# Patient Record
Sex: Female | Born: 1935
Health system: Southern US, Community
[De-identification: ages and names within clinical notes are randomized; demographics above are authoritative.]

## PROBLEM LIST (undated history)

## (undated) DIAGNOSIS — Z8619 Personal history of other infectious and parasitic diseases: Secondary | ICD-10-CM

## (undated) DIAGNOSIS — M1990 Unspecified osteoarthritis, unspecified site: Secondary | ICD-10-CM

## (undated) DIAGNOSIS — F32A Depression, unspecified: Secondary | ICD-10-CM

## (undated) DIAGNOSIS — K222 Esophageal obstruction: Secondary | ICD-10-CM

## (undated) DIAGNOSIS — M810 Age-related osteoporosis without current pathological fracture: Secondary | ICD-10-CM

## (undated) DIAGNOSIS — K5792 Diverticulitis of intestine, part unspecified, without perforation or abscess without bleeding: Secondary | ICD-10-CM

## (undated) DIAGNOSIS — I872 Venous insufficiency (chronic) (peripheral): Secondary | ICD-10-CM

## (undated) DIAGNOSIS — E785 Hyperlipidemia, unspecified: Secondary | ICD-10-CM

## (undated) DIAGNOSIS — F329 Major depressive disorder, single episode, unspecified: Secondary | ICD-10-CM

## (undated) DIAGNOSIS — I639 Cerebral infarction, unspecified: Secondary | ICD-10-CM

## (undated) DIAGNOSIS — I10 Essential (primary) hypertension: Secondary | ICD-10-CM

## (undated) DIAGNOSIS — K219 Gastro-esophageal reflux disease without esophagitis: Secondary | ICD-10-CM

## (undated) DIAGNOSIS — K635 Polyp of colon: Secondary | ICD-10-CM

## (undated) DIAGNOSIS — R32 Unspecified urinary incontinence: Secondary | ICD-10-CM

## (undated) HISTORY — DX: Esophageal obstruction: K22.2

## (undated) HISTORY — DX: Polyp of colon: K63.5

## (undated) HISTORY — DX: Personal history of other infectious and parasitic diseases: Z86.19

## (undated) HISTORY — DX: Cerebral infarction, unspecified: I63.9

## (undated) HISTORY — PX: UPPER GASTROINTESTINAL ENDOSCOPY: SHX188

## (undated) HISTORY — DX: Venous insufficiency (chronic) (peripheral): I87.2

## (undated) HISTORY — DX: Unspecified urinary incontinence: R32

## (undated) HISTORY — PX: POLYPECTOMY: SHX149

## (undated) HISTORY — DX: Diverticulitis of intestine, part unspecified, without perforation or abscess without bleeding: K57.92

## (undated) HISTORY — DX: Gastro-esophageal reflux disease without esophagitis: K21.9

## (undated) HISTORY — DX: Major depressive disorder, single episode, unspecified: F32.9

## (undated) HISTORY — PX: TUBAL LIGATION: SHX77

## (undated) HISTORY — DX: Depression, unspecified: F32.A

## (undated) HISTORY — DX: Hyperlipidemia, unspecified: E78.5

## (undated) HISTORY — DX: Age-related osteoporosis without current pathological fracture: M81.0

## (undated) HISTORY — PX: COLONOSCOPY: SHX174

## (undated) HISTORY — DX: Essential (primary) hypertension: I10

## (undated) HISTORY — PX: APPENDECTOMY: SHX54

## (undated) HISTORY — DX: Unspecified osteoarthritis, unspecified site: M19.90

---

## 1998-08-06 ENCOUNTER — Other Ambulatory Visit: Admission: RE | Admit: 1998-08-06 | Discharge: 1998-08-06 | Payer: Self-pay | Admitting: Internal Medicine

## 1999-05-08 ENCOUNTER — Ambulatory Visit: Admission: RE | Admit: 1999-05-08 | Discharge: 1999-05-08 | Payer: Self-pay | Admitting: Dentistry

## 1999-12-04 ENCOUNTER — Encounter: Admission: RE | Admit: 1999-12-04 | Discharge: 1999-12-04 | Payer: Self-pay | Admitting: Cardiology

## 1999-12-04 ENCOUNTER — Encounter: Payer: Self-pay | Admitting: Cardiology

## 2000-01-15 ENCOUNTER — Encounter: Payer: Self-pay | Admitting: Rheumatology

## 2000-01-15 ENCOUNTER — Encounter: Admission: RE | Admit: 2000-01-15 | Discharge: 2000-01-15 | Payer: Self-pay | Admitting: Rheumatology

## 2000-02-22 ENCOUNTER — Other Ambulatory Visit: Admission: RE | Admit: 2000-02-22 | Discharge: 2000-02-22 | Payer: Self-pay | Admitting: Obstetrics and Gynecology

## 2000-02-22 ENCOUNTER — Encounter (INDEPENDENT_AMBULATORY_CARE_PROVIDER_SITE_OTHER): Payer: Self-pay | Admitting: Specialist

## 2000-12-12 ENCOUNTER — Encounter: Admission: RE | Admit: 2000-12-12 | Discharge: 2000-12-12 | Payer: Self-pay | Admitting: Cardiology

## 2000-12-12 ENCOUNTER — Encounter: Payer: Self-pay | Admitting: Cardiology

## 2001-02-27 ENCOUNTER — Other Ambulatory Visit: Admission: RE | Admit: 2001-02-27 | Discharge: 2001-02-27 | Payer: Self-pay | Admitting: Obstetrics and Gynecology

## 2001-12-13 ENCOUNTER — Encounter: Admission: RE | Admit: 2001-12-13 | Discharge: 2001-12-13 | Payer: Self-pay | Admitting: Obstetrics and Gynecology

## 2001-12-13 ENCOUNTER — Encounter: Payer: Self-pay | Admitting: Obstetrics and Gynecology

## 2002-02-14 ENCOUNTER — Encounter (INDEPENDENT_AMBULATORY_CARE_PROVIDER_SITE_OTHER): Payer: Self-pay | Admitting: Specialist

## 2002-02-14 ENCOUNTER — Ambulatory Visit (HOSPITAL_COMMUNITY): Admission: RE | Admit: 2002-02-14 | Discharge: 2002-02-14 | Payer: Self-pay | Admitting: Gastroenterology

## 2002-04-21 ENCOUNTER — Ambulatory Visit (HOSPITAL_COMMUNITY): Admission: EM | Admit: 2002-04-21 | Discharge: 2002-04-22 | Payer: Self-pay | Admitting: Emergency Medicine

## 2002-05-11 ENCOUNTER — Other Ambulatory Visit: Admission: RE | Admit: 2002-05-11 | Discharge: 2002-05-11 | Payer: Self-pay | Admitting: Obstetrics and Gynecology

## 2002-06-29 ENCOUNTER — Encounter: Payer: Self-pay | Admitting: Internal Medicine

## 2002-06-29 ENCOUNTER — Ambulatory Visit (HOSPITAL_COMMUNITY): Admission: RE | Admit: 2002-06-29 | Discharge: 2002-06-29 | Payer: Self-pay | Admitting: Internal Medicine

## 2002-10-09 ENCOUNTER — Encounter: Payer: Self-pay | Admitting: Internal Medicine

## 2002-10-09 ENCOUNTER — Ambulatory Visit (HOSPITAL_COMMUNITY): Admission: RE | Admit: 2002-10-09 | Discharge: 2002-10-09 | Payer: Self-pay | Admitting: Internal Medicine

## 2002-12-18 ENCOUNTER — Encounter: Admission: RE | Admit: 2002-12-18 | Discharge: 2002-12-18 | Payer: Self-pay | Admitting: Obstetrics and Gynecology

## 2002-12-18 ENCOUNTER — Encounter: Payer: Self-pay | Admitting: Obstetrics and Gynecology

## 2003-02-12 ENCOUNTER — Encounter: Admission: RE | Admit: 2003-02-12 | Discharge: 2003-02-12 | Payer: Self-pay | Admitting: Rheumatology

## 2003-06-04 ENCOUNTER — Other Ambulatory Visit: Admission: RE | Admit: 2003-06-04 | Discharge: 2003-06-04 | Payer: Self-pay | Admitting: Obstetrics and Gynecology

## 2003-12-20 ENCOUNTER — Encounter: Admission: RE | Admit: 2003-12-20 | Discharge: 2003-12-20 | Payer: Self-pay | Admitting: Cardiology

## 2003-12-25 ENCOUNTER — Encounter: Admission: RE | Admit: 2003-12-25 | Discharge: 2003-12-25 | Payer: Self-pay | Admitting: Cardiology

## 2005-04-20 ENCOUNTER — Encounter: Admission: RE | Admit: 2005-04-20 | Discharge: 2005-04-20 | Payer: Self-pay | Admitting: Obstetrics and Gynecology

## 2005-11-18 ENCOUNTER — Ambulatory Visit: Payer: Self-pay | Admitting: Internal Medicine

## 2005-12-09 ENCOUNTER — Ambulatory Visit: Payer: Self-pay | Admitting: Internal Medicine

## 2005-12-09 ENCOUNTER — Encounter: Payer: Self-pay | Admitting: Internal Medicine

## 2006-06-22 ENCOUNTER — Encounter: Admission: RE | Admit: 2006-06-22 | Discharge: 2006-06-22 | Payer: Self-pay | Admitting: Cardiology

## 2006-08-01 ENCOUNTER — Encounter: Admission: RE | Admit: 2006-08-01 | Discharge: 2006-08-01 | Payer: Self-pay | Admitting: Cardiology

## 2006-10-04 ENCOUNTER — Encounter: Admission: RE | Admit: 2006-10-04 | Discharge: 2006-11-02 | Payer: Self-pay | Admitting: *Deleted

## 2008-01-16 ENCOUNTER — Encounter: Admission: RE | Admit: 2008-01-16 | Discharge: 2008-01-16 | Payer: Self-pay | Admitting: Cardiology

## 2008-08-13 ENCOUNTER — Encounter: Admission: RE | Admit: 2008-08-13 | Discharge: 2008-08-13 | Payer: Self-pay | Admitting: Rheumatology

## 2009-04-18 ENCOUNTER — Encounter: Admission: RE | Admit: 2009-04-18 | Discharge: 2009-04-18 | Payer: Self-pay | Admitting: Obstetrics and Gynecology

## 2009-10-21 ENCOUNTER — Encounter
Admission: RE | Admit: 2009-10-21 | Discharge: 2010-01-19 | Payer: Self-pay | Source: Home / Self Care | Admitting: Rheumatology

## 2009-12-22 ENCOUNTER — Ambulatory Visit: Payer: Self-pay | Admitting: Cardiology

## 2010-01-27 ENCOUNTER — Encounter
Admission: RE | Admit: 2010-01-27 | Discharge: 2010-02-12 | Payer: Self-pay | Source: Home / Self Care | Admitting: Rheumatology

## 2010-02-13 ENCOUNTER — Ambulatory Visit: Payer: Self-pay | Admitting: Cardiology

## 2010-04-15 ENCOUNTER — Ambulatory Visit: Payer: Self-pay | Admitting: Cardiology

## 2010-04-20 ENCOUNTER — Encounter: Payer: Self-pay | Admitting: Cardiology

## 2010-05-08 ENCOUNTER — Other Ambulatory Visit: Payer: Self-pay | Admitting: Obstetrics and Gynecology

## 2010-05-08 DIAGNOSIS — Z1231 Encounter for screening mammogram for malignant neoplasm of breast: Secondary | ICD-10-CM

## 2010-06-02 ENCOUNTER — Ambulatory Visit: Payer: Self-pay

## 2010-06-05 ENCOUNTER — Ambulatory Visit: Payer: Self-pay

## 2010-06-19 ENCOUNTER — Ambulatory Visit
Admission: RE | Admit: 2010-06-19 | Discharge: 2010-06-19 | Disposition: A | Discharge status: Home / Self Care | Payer: Medicare Other | Source: Ambulatory Visit | Attending: Obstetrics and Gynecology | Admitting: Obstetrics and Gynecology

## 2010-06-19 DIAGNOSIS — Z1231 Encounter for screening mammogram for malignant neoplasm of breast: Secondary | ICD-10-CM

## 2010-07-28 ENCOUNTER — Other Ambulatory Visit: Payer: Self-pay | Admitting: *Deleted

## 2010-07-28 DIAGNOSIS — F419 Anxiety disorder, unspecified: Secondary | ICD-10-CM

## 2010-07-28 MED ORDER — ALPRAZOLAM 1 MG PO TABS: 1.0000 mg | ORAL_TABLET | Freq: Two times a day (BID) | ORAL | Status: DC | PRN

## 2010-07-28 NOTE — Telephone Encounter (Signed)
Refilled meds per fax request.  

## 2010-08-14 NOTE — Assessment & Plan Note (Signed)
[facility]                           GASTROENTEROLOGY OFFICE NOTE   NAME:Sarah Sarah [name], Sarah Sarah [name]                      MRN:          [id number]  DATE:11/18/2005                            DOB:          07/04/1926    REFERRING PHYSICIAN:  Aundra Dubin, MD   REASON FOR CONSULTATION:  Iron deficiency anemia.   HISTORY OF PRESENT ILLNESS:  This is Sarah [name] pleasant 77 year old white female  with Sarah [name] history of gastroesophageal reflux disease, complicated by peptic  stricture requiring esophageal dilation, atrophic gastritis, adenomatous  colon polyps, diverticulosis, and osteoarthritis.  She is referred through  the courtesy of Dr. Kellie Simmering, her rheumatologist, regarding progressive  anemia with iron deficiency.  Since February the patient's hemoglobin has  drifted from 10.7 to 10.0.  In addition, her MCV has dropped from 90 to 86.  Iron studies obtained 10/28/05 revealed iron deficiency with an iron  saturation of 6% and Sarah [name] serum ferritin of 5.  She is not referred.  Patient  has been on Diclofenac for her arthritis.  Concurrently she has been on  omeprazole.  GI review of systems is quite unremarkable.  On Omeprazole no  indigestion or heartburn.  No dysphagia.  No abdominal pain.  She does have  chronic constipation for which she takes GlycoLax daily with good results.  She has had some weight gain on prednisone.  No melena or hematochezia.  The  last colonoscopy was performed in 2000.  She had Sarah [name] adenomatous colon polyp  removed.  She was to have followed up in three years but elected not to  despite such Sarah [name] recommendation.  In part due to the death of her husband  three and Sarah [name] half years ago.  Patient was placed on conventional iron orally  which she did not tolerate.  As well she apparently did not tolerate slow  release preparation sighting nausea.   PAST MEDICAL HISTORY:  As above.   ALLERGIES:  1. CELEBREX.  2. LATEX GLOVES.  3. INTOLERANCE TO  CIPROFLOXACIN.   CURRENT MEDICATIONS:  1. Calcium 1200 mg daily.  2. Multivitamin.  3. Aspirin 81 mg daily.  4. __________  2 mg at night.  5. Pamelor 25 mg at night.  6. Halcion 25 mg prn.  7. Actonel 30 mg weekly.  8. Xanax 1 mg prn.  9. GlycoLax 17 grams daily in 10 ounces of water.  10.Prilosec 20 mg daily.  11.Lasix 20 mg prn.  12.Desonide prn.  13.Diclofenac 75 mg bid.  14.Glucosamine daily.   SOCIAL HISTORY:  Widowed, lives alone in [place].   FAMILY HISTORY:  Non-contributory.   PHYSICAL EXAMINATION:  Pleasant elderly female in no acute distress.  Her  blood pressure 100/60, heart rate 78, weight 152.6 pounds.  HEENT:  Sclera anicteric, conjunctiva pink, oral mucosa intact, no  adenopathy.  LUNGS:  Clear.  HEART: Regular.  ABDOMEN:  Slightly obese, soft without tenderness, mass or hernia.  Good  bowel sounds heard.  RECTAL:  Deferred.  Prior Hemoccult cards were reportedly negative.  EXTREMITIES:  Without edema.   IMPRESSION:  This is Sarah [name] 77 year old female with  history of reflux disease,  peptic stricture, chronic stable constipation, and adenomatous colon polyps  who presents with iron deficiency anemia.  Fairly unremarkable GI review of  systems.  Possible causes include chronic blood loss from combination of  aspirin and Diclofenac.  However, with Sarah [name] history of adenomatous colon polyps  recurrent neoplasia needs to be excluded.   RECOMMENDATIONS:  Colonoscopy and upper endoscopy.  Ashby Dawes of the procedure  as well as the risks, benefits and alternatives were reviewed in detail.  She understood and agreed to proceed.  Further recommendations to follow.                                   Wilhemina Bonito. Eda Keys., MD   JNP/MedQ  DD:  11/18/2005  DT:  11/18/2005  Job #:  [id number]   cc:   Aundra Dubin, MD  Cassell Clement, MD

## 2010-08-14 NOTE — Op Note (Signed)
   NAMESHYA, KOVATCH NO.:  [id number]   MEDICAL RECORD NO.:  [id number]                   PATIENT TYPE:  EMS   LOCATION:  ED                                   FACILITY:  [facility]   PHYSICIAN:  Georgiana Spinner, M.D.                 DATE OF BIRTH:  July 09, 1926   DATE OF PROCEDURE:  DATE OF DISCHARGE:  04/22/2002                                 OPERATIVE REPORT   PROCEDURE:  Upper endoscopy with foreign body removal.   INDICATIONS:  Foreign body in esophagus.   ANESTHESIA:  Demerol 100, Versed 10 mg, Phenergan 12.5 mg.   DESCRIPTION OF PROCEDURE:  With patient mildly sedated in the left lateral  decubitus position, the Olympus videoscopic endoscope was inserted in the  mouth and passed under direct vision into the esophagus, and distal  esophagus approached, and a whitish foreign body, presumably meat, was seen.  It was easily pushed into the stomach.  In the stomach, fundus, body, antrum  was visualized as was duodenal bulb.  The endoscope was placed in  retroflexion as it was pulled back into the stomach to view the stomach from  below.  The endoscope was then straightened and withdrawn, taking  circumferential views of the remaining gastric and esophageal mucosa,  stopping to photograph the distal esophagus where the GE junction was, and a  stricture just above this area which was photographed.  There was a small  ulcer, presumably due to the trauma, photographed; the endoscope was  withdrawn.  The patient's vital signs and pulse oximeter remained stable.  The patient tolerated the procedure well without apparent complications.   FINDINGS:  Stricture in the distal esophagus with food foreign body removed  by pushing it into the stomach.  Otherwise, an unremarkable exam.   PLAN:  The patient has follow-up already planned for esophageal dilation.  We will place patient on liquid diet, avoidance of meats and breads, and  follow up with her regular  gastroenterologist.                                               Georgiana Spinner, M.D.    GMO/MEDQ  D:  04/22/2002  T:  04/22/2002  Job:  [id number]

## 2010-08-30 ENCOUNTER — Other Ambulatory Visit: Payer: Self-pay | Admitting: Cardiology

## 2010-08-30 DIAGNOSIS — G4700 Insomnia, unspecified: Secondary | ICD-10-CM

## 2010-08-31 NOTE — Telephone Encounter (Signed)
escribe request Faxed signed rx back

## 2010-10-04 ENCOUNTER — Other Ambulatory Visit: Payer: Self-pay | Admitting: Cardiology

## 2010-10-05 ENCOUNTER — Telehealth: Payer: Self-pay | Admitting: Cardiology

## 2010-10-05 NOTE — Telephone Encounter (Signed)
Patient called requesting labwork be done.  States has not had any done in almost 1 yr.  Spoke to nurse and she advised labs for patient to have.  Advised and made appointment for patient.

## 2010-10-05 NOTE — Telephone Encounter (Signed)
escribe request  

## 2010-10-06 ENCOUNTER — Other Ambulatory Visit: Payer: Self-pay | Admitting: *Deleted

## 2010-10-06 DIAGNOSIS — E785 Hyperlipidemia, unspecified: Secondary | ICD-10-CM

## 2010-10-16 ENCOUNTER — Other Ambulatory Visit: Payer: Medicare Other | Admitting: *Deleted

## 2010-10-20 ENCOUNTER — Other Ambulatory Visit: Payer: Medicare Other | Admitting: *Deleted

## 2010-10-21 ENCOUNTER — Other Ambulatory Visit: Payer: Medicare Other | Admitting: *Deleted

## 2010-10-21 ENCOUNTER — Other Ambulatory Visit: Payer: Self-pay | Admitting: Cardiology

## 2010-10-21 ENCOUNTER — Other Ambulatory Visit (INDEPENDENT_AMBULATORY_CARE_PROVIDER_SITE_OTHER): Payer: Medicare Other | Admitting: *Deleted

## 2010-10-21 DIAGNOSIS — E785 Hyperlipidemia, unspecified: Secondary | ICD-10-CM

## 2010-10-21 LAB — LDL CHOLESTEROL, DIRECT: Direct LDL: 101.1 mg/dL

## 2010-10-21 LAB — BASIC METABOLIC PANEL
BUN: 16 mg/dL (ref 6–23)
Calcium: 8.8 mg/dL (ref 8.4–10.5)
GFR: 78.6 mL/min (ref >60.00)

## 2010-10-21 LAB — CBC WITH DIFFERENTIAL/PLATELET
Basophils Absolute: 0 10*3/uL (ref 0.0–0.1)
Hemoglobin: 12.2 g/dL (ref 12.0–15.0)
Lymphs Abs: 1.9 10*3/uL (ref 0.7–4.0)
MCV: 95.9 fl (ref 78.0–100.0)
RBC: 3.79 Mil/uL — ABNORMAL LOW (ref 3.87–5.11)
RDW: 13.7 % (ref 11.5–14.6)
WBC: 5.2 10*3/uL (ref 4.5–10.5)

## 2010-10-21 LAB — HEPATIC FUNCTION PANEL
AST: 12 U/L (ref 0–37)
Alkaline Phosphatase: 50 U/L (ref 39–117)
Bilirubin, Direct: 0 mg/dL (ref 0.0–0.3)
Total Bilirubin: 0.5 mg/dL (ref 0.3–1.2)

## 2010-10-21 LAB — LIPID PANEL
Cholesterol: 212 mg/dL — ABNORMAL HIGH (ref 0–200)
Total CHOL/HDL Ratio: 2

## 2010-10-28 ENCOUNTER — Telehealth: Payer: Self-pay | Admitting: *Deleted

## 2010-10-28 ENCOUNTER — Other Ambulatory Visit: Payer: Self-pay | Admitting: *Deleted

## 2010-10-28 DIAGNOSIS — G4700 Insomnia, unspecified: Secondary | ICD-10-CM

## 2010-10-28 MED ORDER — ALPRAZOLAM 1 MG PO TABS: 1.0000 mg | ORAL_TABLET | Freq: Every evening | ORAL | Status: DC | PRN

## 2010-10-28 NOTE — Telephone Encounter (Signed)
Advised of labs 

## 2010-10-28 NOTE — Telephone Encounter (Signed)
Refilled alprazolam 

## 2010-12-02 ENCOUNTER — Other Ambulatory Visit: Payer: Self-pay | Admitting: Cardiology

## 2010-12-02 DIAGNOSIS — I10 Essential (primary) hypertension: Secondary | ICD-10-CM

## 2010-12-02 DIAGNOSIS — E785 Hyperlipidemia, unspecified: Secondary | ICD-10-CM

## 2010-12-04 ENCOUNTER — Other Ambulatory Visit: Payer: Self-pay | Admitting: Cardiology

## 2010-12-04 DIAGNOSIS — F419 Anxiety disorder, unspecified: Secondary | ICD-10-CM

## 2010-12-09 ENCOUNTER — Telehealth: Payer: Self-pay | Admitting: Cardiology

## 2010-12-09 ENCOUNTER — Telehealth: Payer: Self-pay | Admitting: *Deleted

## 2010-12-09 NOTE — Telephone Encounter (Signed)
Pt calling re wanting a hemacult

## 2010-12-09 NOTE — Telephone Encounter (Signed)
Patient will call her GI doctor for follow-up hemoccult checks and we mailed her a copy of [facility]

## 2010-12-11 ENCOUNTER — Other Ambulatory Visit: Payer: Self-pay | Admitting: Cardiology

## 2010-12-11 NOTE — Telephone Encounter (Signed)
Refilled lasix and vesicare to [facility]

## 2010-12-16 ENCOUNTER — Telehealth: Payer: Self-pay | Admitting: Cardiology

## 2010-12-16 NOTE — Telephone Encounter (Signed)
Recommended Dr Willey Blade

## 2010-12-16 NOTE — Telephone Encounter (Signed)
Wants you to recommend a primary care provider

## 2010-12-21 ENCOUNTER — Telehealth: Payer: Self-pay | Admitting: Cardiology

## 2010-12-21 NOTE — Telephone Encounter (Signed)
Thought yeast infection.

## 2010-12-21 NOTE — Telephone Encounter (Signed)
Has vaginal itching and has seen her dermatologist for this.  They thought yeast time, so she will call her GYN

## 2010-12-21 NOTE — Telephone Encounter (Signed)
Pt called wants you to call her about rx's

## 2010-12-23 ENCOUNTER — Telehealth: Payer: Self-pay | Admitting: Cardiology

## 2010-12-23 NOTE — Telephone Encounter (Signed)
Pt calling asking that someone call her back. Please call pt back.   Pt got a summons yesterday to serve on jury and pt wanted a MD letter stating that pt doesn't need to serve on jury duty.

## 2010-12-23 NOTE — Telephone Encounter (Signed)
Spoke with patient and she will just fill out the back of form and mail in

## 2010-12-29 ENCOUNTER — Telehealth: Payer: Self-pay | Admitting: Cardiology

## 2010-12-29 NOTE — Telephone Encounter (Signed)
Left message

## 2010-12-29 NOTE — Telephone Encounter (Signed)
Pt was referred to dr Felicity Coyer and she couldn't get in until march, would he recommend someone else?

## 2011-01-08 NOTE — Telephone Encounter (Signed)
Never heard back, left message just keep March appointment with Dr Felicity Coyer.

## 2011-01-11 ENCOUNTER — Telehealth: Payer: Self-pay | Admitting: Cardiology

## 2011-01-11 NOTE — Telephone Encounter (Signed)
Spoke with patient regarding new primary care doctor

## 2011-01-11 NOTE — Telephone Encounter (Signed)
Pt calling re her new dr

## 2011-02-01 ENCOUNTER — Telehealth: Payer: Self-pay | Admitting: Cardiology

## 2011-02-01 ENCOUNTER — Encounter: Payer: Self-pay | Admitting: *Deleted

## 2011-02-01 DIAGNOSIS — R079 Chest pain, unspecified: Secondary | ICD-10-CM | POA: Insufficient documentation

## 2011-02-01 DIAGNOSIS — M1990 Unspecified osteoarthritis, unspecified site: Secondary | ICD-10-CM | POA: Insufficient documentation

## 2011-02-02 ENCOUNTER — Encounter: Payer: Self-pay | Admitting: Cardiology

## 2011-02-02 ENCOUNTER — Other Ambulatory Visit: Payer: Medicare Other | Admitting: *Deleted

## 2011-02-02 ENCOUNTER — Ambulatory Visit (INDEPENDENT_AMBULATORY_CARE_PROVIDER_SITE_OTHER): Payer: Medicare Other | Admitting: Cardiology

## 2011-02-02 ENCOUNTER — Other Ambulatory Visit (INDEPENDENT_AMBULATORY_CARE_PROVIDER_SITE_OTHER): Payer: Medicare Other | Admitting: *Deleted

## 2011-02-02 DIAGNOSIS — E785 Hyperlipidemia, unspecified: Secondary | ICD-10-CM

## 2011-02-02 DIAGNOSIS — I10 Essential (primary) hypertension: Secondary | ICD-10-CM

## 2011-02-02 DIAGNOSIS — M1990 Unspecified osteoarthritis, unspecified site: Secondary | ICD-10-CM

## 2011-02-02 DIAGNOSIS — R079 Chest pain, unspecified: Secondary | ICD-10-CM

## 2011-02-02 DIAGNOSIS — R32 Unspecified urinary incontinence: Secondary | ICD-10-CM

## 2011-02-02 LAB — BASIC METABOLIC PANEL
Calcium: 9.1 mg/dL (ref 8.4–10.5)
GFR: 87.95 mL/min (ref >60.00)
Glucose, Bld: 97 mg/dL (ref 70–99)
Sodium: 134 mEq/L — ABNORMAL LOW (ref 135–145)

## 2011-02-02 LAB — LIPID PANEL
HDL: 82.6 mg/dL (ref >39.00)
Triglycerides: 131 mg/dL (ref 0.0–149.0)

## 2011-02-02 LAB — HEPATIC FUNCTION PANEL
ALT: 16 U/L (ref 0–35)
AST: 14 U/L (ref 0–37)
Albumin: 4 g/dL (ref 3.5–5.2)
Total Bilirubin: 0.5 mg/dL (ref 0.3–1.2)

## 2011-02-02 MED ORDER — NITROGLYCERIN 0.4 MG SL SUBL: 0.4000 mg | SUBLINGUAL_TABLET | SUBLINGUAL | Status: DC | PRN

## 2011-02-02 NOTE — Assessment & Plan Note (Signed)
Patient is still having problems with bladder incontinence.  She sees Dr. Bjorn Pippin.  Her symptoms have not worsened since her previous visit.

## 2011-02-02 NOTE — Assessment & Plan Note (Signed)
The patient has been expressing some intermittent left-sided chest discomfort without radiation.  She has not been having any symptoms of congestive heart failure.  She's had no dizziness or syncope.  An electrocardiogram today which shows a sinus rhythm and is within normal limits.

## 2011-02-02 NOTE — Patient Instructions (Signed)
Your physician recommends that you continue on your current medications as directed. Please refer to the Current Medication list given to you today.  Your physician recommends that you schedule a follow-up appointment in: 4 months  

## 2011-02-02 NOTE — Progress Notes (Signed)
Sarah [name] Date of Birth:  [birth year] [facility] / [facility] [address]   [address] [place], [place]  [address] [phone]           Fax   [phone]  History of Present Illness: This pleasant 77 year old widowed Caucasian female is seen for a scheduled four-month followup office visit.  She has a history of atypical chest pain.  She does not have a history of ischemic heart disease.  She had a normal stress Cardiolite in April of 2010.  Recently she's been experiencing more palpitations and has had some sharp shooting nonradiating chest pain.  She also states that she's been under more stress.  Current Outpatient Prescriptions  Medication Sig Dispense Refill  . Acetaminophen (TYLENOL ARTHRITIS PAIN PO) Take by mouth 2 (two) times daily.        Marland Kitchen ALPRAZolam (XANAX) 1 MG tablet Take 1 mg by mouth 2 (two) times daily.        Marland Kitchen aspirin 81 MG tablet Take 81 mg by mouth daily.        . Calcium Carbonate (CALCIUM 500 PO) Take by mouth daily.        . Cholecalciferol (VITAMIN D PO) Take by mouth. Taking 2000 daily       . Cyanocobalamin (VITAMIN B 12 PO) Take by mouth. Taking 1000 daily       . LASIX 20 MG tablet TAKE 1 TABLET ONCE DAILY AS NEEDED FOR FLUID.  30 each  11  . Multiple Vitamin (MULTIVITAMINS PO) Take by mouth daily.        . Naproxen (NAPROSYN PO) Take by mouth. As needed       . Omega-3 Fatty Acids (FISH OIL PO) Take by mouth 2 (two) times daily.        . Omeprazole (PRILOSEC PO) Take by mouth daily.        Marland Kitchen PAMELOR 25 MG capsule TAKE (1) CAPSULE DAILY.  30 each  5  . perphenazine (TRILAFON) 2 MG tablet TAKE ONE TABLET AT BEDTIME.  30 tablet  2  . polyethylene glycol (MIRALAX / GLYCOLAX) packet Take 17 g by mouth daily. As needed       . risedronate (ACTONEL) 35 MG tablet Take 35 mg by mouth every 7 (seven) days. with water on empty stomach, nothing by mouth or lie down for next 30 minutes.       . triazolam (HALCION) 0.25 MG tablet Take 1 tablet at  bedtime as needed or as directed      . VESICARE 10 MG tablet TAKE 1 TABLET ONCE DAILY.  30 each  11  . nitroGLYCERIN (NITROSTAT) 0.4 MG SL tablet Place 1 tablet (0.4 mg total) under the tongue every 5 (five) minutes as needed for chest pain.  25 tablet  PRN    Allergies  Allergen Reactions  . Celebrex (Celecoxib)     rash  . Latex   . Lipitor (Atorvastatin Calcium)     Leg weakness,memory loss  . Lovastatin     Leg weakness    Patient Active Problem List  Diagnoses  . Osteoarthritis  . Bladder incontinence  . Chest pain    History  Smoking status  . Never Smoker   Smokeless tobacco  . Not on file    History  Alcohol Use     No family history on file.  Review of Systems: Constitutional: no fever chills diaphoresis or fatigue or change in weight.  Head and neck: no  hearing loss, no epistaxis, no photophobia or visual disturbance. Respiratory: No cough, shortness of breath or wheezing. Cardiovascular: No chest pain peripheral edema, palpitations. Gastrointestinal: No abdominal distention, no abdominal pain, no change in bowel habits hematochezia or melena. Genitourinary: No dysuria, no frequency, no urgency, no nocturia. Musculoskeletal:No arthralgias, no back pain, no gait disturbance or myalgias. Neurological: No dizziness, no headaches, no numbness, no seizures, no syncope, no weakness, no tremors. Hematologic: No lymphadenopathy, no easy bruising. Psychiatric: No confusion, no hallucinations, no sleep disturbance.    Physical Exam: Filed Vitals:   02/02/11 1446  BP: 132/70  Pulse: 90   Gen. appearance reveals a well-developed well-nourished elderly woman in no distress.The head and neck exam reveals pupils equal and reactive.  Extraocular movements are full.  There is no scleral icterus.  The mouth and pharynx are normal.  The neck is supple.  The carotids reveal no bruits.  The jugular venous pressure is normal.  The  thyroid is not enlarged.  There is no  lymphadenopathy.  The chest is clear to percussion and auscultation.  There are no rales or rhonchi.  Expansion of the chest is symmetrical.  The precordium is quiet.  The first heart sound is normal.  The second heart sound is physiologically split.  There is no murmur gallop rub or click.  There is no abnormal lift or heave.  The abdomen is soft and nontender.  The bowel sounds are normal.  The liver and spleen are not enlarged.  There are no abdominal masses.  There are no abdominal bruits.  Extremities reveal good pedal pulses.  There is no phlebitis or edema.  There is no cyanosis or clubbing.  Strength is normal and symmetrical in all extremities.  There is no lateralizing weakness.  There are no sensory deficits.  The skin is warm and dry.  There is no rash.  EKG shows normal sinus rhythm and is within normal limits.  Assessment / Plan:  Continue present medication.  Weight gave her a new prescription for sublingual nitroglycerin to have on hand.  Return in 4 months for office visit and fasting lab work.

## 2011-02-02 NOTE — Assessment & Plan Note (Signed)
Patient has a history of osteoarthritis.  She previously had been seeing a rheumatologist but is not seen one at present.  She has an appointment in March 2013 to see her new primary care provider Dr. Madelyn Brunner

## 2011-02-03 ENCOUNTER — Telehealth: Payer: Self-pay | Admitting: *Deleted

## 2011-02-03 NOTE — Telephone Encounter (Signed)
Message copied by Burnell Blanks on Wed Feb 03, 2011  5:19 PM ------      Message from: Cassell Clement      Created: Tue Feb 02, 2011  8:55 PM       Please report.  The labs are stable.  Continue same meds.  Continue careful diet.

## 2011-02-03 NOTE — Telephone Encounter (Signed)
Advised of labs and mailed copy 

## 2011-02-05 ENCOUNTER — Other Ambulatory Visit: Payer: Self-pay | Admitting: Cardiology

## 2011-02-05 NOTE — Telephone Encounter (Signed)
Pt needs refill of bone density med, boniva, uses [facility], pls call if we can't fill it

## 2011-03-02 NOTE — Telephone Encounter (Signed)
Double up on Lasix when she goes to the beach

## 2011-03-02 NOTE — Telephone Encounter (Signed)
Swelling gets severe when she goes to stay at the beach with her sisters.  Home now and is getting better.  Swelling does not go down at bedtime and is quite painful. Takes lasix 20 mg daily when she is there.  She is going back end of February and wants to know what else to do.  Advised to decrease sodium, keep elevated, and try support hose.  Should she increase while there or something else.

## 2011-03-02 NOTE — Telephone Encounter (Signed)
Advised patient

## 2011-03-02 NOTE — Telephone Encounter (Signed)
New problem:  Patient calling C/O retaining fluid. Recently came back from beach.

## 2011-03-04 ENCOUNTER — Other Ambulatory Visit: Payer: Self-pay | Admitting: *Deleted

## 2011-03-04 DIAGNOSIS — F419 Anxiety disorder, unspecified: Secondary | ICD-10-CM

## 2011-03-04 MED ORDER — PERPHENAZINE 2 MG PO TABS: 2.0000 mg | ORAL_TABLET | Freq: Every day | ORAL | Status: DC

## 2011-03-04 NOTE — Telephone Encounter (Signed)
Refilled generic trilafon

## 2011-03-08 ENCOUNTER — Telehealth: Payer: Self-pay | Admitting: Cardiology

## 2011-03-08 ENCOUNTER — Encounter: Payer: Self-pay | Admitting: Cardiology

## 2011-03-08 ENCOUNTER — Ambulatory Visit (INDEPENDENT_AMBULATORY_CARE_PROVIDER_SITE_OTHER): Payer: Medicare Other | Admitting: Cardiology

## 2011-03-08 VITALS — BP 120/78 | HR 86 | Ht 62.0 in | Wt 159.0 lb

## 2011-03-08 DIAGNOSIS — M79661 Pain in right lower leg: Secondary | ICD-10-CM

## 2011-03-08 DIAGNOSIS — R079 Chest pain, unspecified: Secondary | ICD-10-CM

## 2011-03-08 DIAGNOSIS — M79609 Pain in unspecified limb: Secondary | ICD-10-CM

## 2011-03-08 DIAGNOSIS — M1990 Unspecified osteoarthritis, unspecified site: Secondary | ICD-10-CM

## 2011-03-08 NOTE — Telephone Encounter (Signed)
Had offered an appointment prior sending to  Dr. Patty Sermons but she didn't seem to want to get out.  Discussed with her and she wanted for me to check with  Dr. Patty Sermons and see if could just get doppler.  Patient then called back and wants to see  Dr. Patty Sermons first.  Scheduled appointment with  Dr. Patty Sermons

## 2011-03-08 NOTE — Telephone Encounter (Signed)
Patient states she doubled up on lasix and left leg still swollen and painful.  States pain is from toe to her knee, all around the leg.  Recent travel and scheduled to go out of town Saturday.  See or doppler?

## 2011-03-08 NOTE — Assessment & Plan Note (Signed)
The patient has not been expressing any chest pain.  She had a normal stress Cardiolite in April 2010.  She does not have any history of ischemic heart disease.

## 2011-03-08 NOTE — Telephone Encounter (Signed)
Would get venous Doppler of the leg to rule out DVT

## 2011-03-08 NOTE — Patient Instructions (Signed)
Ok to continue using extra Lasix as needed for swelling Keep legs elevated Wear support hose prior to long car trips Keep scheduled appointment in March

## 2011-03-08 NOTE — Telephone Encounter (Signed)
Get a venous Doppler of leg

## 2011-03-08 NOTE — Telephone Encounter (Signed)
New msg Pt is having fluid in left leg and pain please call

## 2011-03-08 NOTE — Assessment & Plan Note (Signed)
Patient has a history of osteoarthritis and is on naproxen as needed.

## 2011-03-08 NOTE — Progress Notes (Signed)
Sarah [name] Date of Birth:  September 28, 1926 [facility] / [facility] [address]   [address] [place], [place]  [address] [phone]           Fax   [phone]  History of Present Illness: This pleasant 77 year old woman is seen for her work in the office visit.  She has a past history of typical chest pain and a history of palpitations. .  She comes in today because of concern over pain in both calves.  This discomfort started the day after she arrived and they family reunion vacation at the beach.  Over the course of the week she noted difficulty getting her shoes on because of swelling of both feet.  She has not had any pleuritic chest pain or symptoms of pulmonary embolus.  Her edema has improved somewhat since we added when necessary Lasix 20 mg daily to her regimen.  She has had a past history of arthritis and has seen rheumatology in the past.  Current Outpatient Prescriptions  Medication Sig Dispense Refill  . Acetaminophen (TYLENOL ARTHRITIS PAIN PO) Take by mouth 2 (two) times daily.        Marland Kitchen ALPRAZolam (XANAX) 1 MG tablet Take 1 mg by mouth 2 (two) times daily.        Marland Kitchen BONIVA 150 MG tablet TAKE ONE TABLET ONCE A MONTH IN THE MORNING ON AN EMPTY STOMACH WITH WATER. REMAIN UPRIGHT.  30 each  5  . Calcium Carbonate (CALCIUM 500 PO) Take by mouth daily.        . Cholecalciferol (VITAMIN D PO) Take by mouth. Taking 2000 daily       . Cyanocobalamin (VITAMIN B 12 PO) Take by mouth. Taking 1000 daily       . ibuprofen (ADVIL,MOTRIN) 100 MG tablet Take 100 mg by mouth every 6 (six) hours as needed.        Marland Kitchen LASIX 20 MG tablet TAKE 1 TABLET ONCE DAILY AS NEEDED FOR FLUID.  30 each  11  . Multiple Vitamin (MULTIVITAMINS PO) Take by mouth daily.        . nitroGLYCERIN (NITROSTAT) 0.4 MG SL tablet Place 1 tablet (0.4 mg total) under the tongue every 5 (five) minutes as needed for chest pain.  25 tablet  PRN  . perphenazine (TRILAFON) 2 MG tablet Take 1 tablet (2 mg total) by  mouth at bedtime.  30 tablet  2  . polyethylene glycol (MIRALAX / GLYCOLAX) packet Take 17 g by mouth daily. As needed       . triazolam (HALCION) 0.25 MG tablet Take 1 tablet at bedtime as needed or as directed      . VESICARE 10 MG tablet TAKE 1 TABLET ONCE DAILY.  30 each  11  . PAMELOR 25 MG capsule TAKE (1) CAPSULE DAILY.  30 each  5    Allergies  Allergen Reactions  . Celebrex (Celecoxib)     rash  . Latex   . Lipitor (Atorvastatin Calcium)     Leg weakness,memory loss  . Lovastatin     Leg weakness    Patient Active Problem List  Diagnoses  . Osteoarthritis  . Bladder incontinence  . Chest pain  . Bilateral calf pain    History  Smoking status  . Never Smoker   Smokeless tobacco  . Not on file    History  Alcohol Use     No family history on file.  Review of Systems: Constitutional: no fever chills  diaphoresis or fatigue or change in weight.  Head and neck: no hearing loss, no epistaxis, no photophobia or visual disturbance. Respiratory: No cough, shortness of breath or wheezing. Cardiovascular: No chest pain peripheral edema, palpitations. Gastrointestinal: No abdominal distention, no abdominal pain, no change in bowel habits hematochezia or melena. Genitourinary: No dysuria, no frequency, no urgency, no nocturia. Musculoskeletal:No arthralgias, no back pain, no gait disturbance or myalgias. Neurological: No dizziness, no headaches, no numbness, no seizures, no syncope, no weakness, no tremors. Hematologic: No lymphadenopathy, no easy bruising. Psychiatric: No confusion, no hallucinations, no sleep disturbance.    Physical Exam: Filed Vitals:   03/08/11 1612  BP: 120/78  Pulse: 86   the general appearance reveals an anxious woman in no distress.The head and neck exam reveals pupils equal and reactive.  Extraocular movements are full.  There is no scleral icterus.  The mouth and pharynx are normal.  The neck is supple.  The carotids reveal no bruits.   The jugular venous pressure is normal.  The  thyroid is not enlarged.  There is no lymphadenopathy.  The chest is clear to percussion and auscultation.  There are no rales or rhonchi.  Expansion of the chest is symmetrical.  The precordium is quiet.  The first heart sound is normal.  The second heart sound is physiologically split.  There is no murmur gallop rub or click.  There is no abnormal lift or heave.  The abdomen is soft and nontender.  The bowel sounds are normal.  The liver and spleen are not enlarged.  There are no abdominal masses.  There are no abdominal bruits.  Extremities reveal good pedal pulses.  The Homans sign is negative bilaterally.  She has just trace pretibial and pedal edema today.  There is no evidence of cellulitis and no palpable cord to suggest DVT.  There is no cyanosis or clubbing.  Strength is normal and symmetrical in all extremities.  There is no lateralizing weakness.  There are no sensory deficits.  The skin is warm and dry.  There is no rash.     Assessment / Plan: The patient does not appear to have DVT.  I suspect that she does have venous insufficiency which was exacerbated by a long car trip and a high salt diet at the beach.  She will continue to use furosemide as necessary.  Before her next car trip she will try using support hose to prevent fluid retention.  She is to try keeping her legs elevated when she is resting.  She'll be rechecked at her regular visit or sooner when necessary.

## 2011-03-08 NOTE — Assessment & Plan Note (Signed)
The patient has had diffuse aching in both calves but worse on the left.  The swelling was also worse on the left.  She has not had any evidence of cellulitis.  She has not had any cardiopulmonary symptoms associated with this.

## 2011-03-09 ENCOUNTER — Telehealth: Payer: Self-pay | Admitting: Cardiology

## 2011-03-09 NOTE — Telephone Encounter (Signed)
Pt has a couple of questions she needs to ask you about her leg

## 2011-03-09 NOTE — Telephone Encounter (Signed)
Patient wanted to discuss and get name of diagnosis of legs (venous insufficiency)

## 2011-03-11 ENCOUNTER — Telehealth: Payer: Self-pay | Admitting: Cardiology

## 2011-03-11 NOTE — Telephone Encounter (Signed)
Discussed with  Dr. Patty Sermons and he thinks she needs to go to urgent care.  Called patient and left message for her to call back.

## 2011-03-11 NOTE — Telephone Encounter (Signed)
She thinks this may be gout because she has had in the past, feels like that.  Took 1/2 hydrocodone and 2 ES tylenol and it did ease up some.  Very painful in the heel area only.  Advised even if labs done tomorrow wouldn't have results until Monday.  Advised should go to urgent care or call orthopedic.  She doesn't have orthopedic, but does have a foot doctor.  Advised to call them

## 2011-03-11 NOTE — Telephone Encounter (Signed)
FU Call: Pt calling back to speak to [facility]. Please return pt call to discuss further.

## 2011-03-11 NOTE — Telephone Encounter (Signed)
New Msg: Pt calling asking to speak with [facility] regarding terrible pain in pt L heel. Please return pt call to discuss further.

## 2011-04-04 ENCOUNTER — Other Ambulatory Visit: Payer: Self-pay | Admitting: Cardiology

## 2011-04-04 DIAGNOSIS — F329 Major depressive disorder, single episode, unspecified: Secondary | ICD-10-CM

## 2011-04-04 DIAGNOSIS — G4700 Insomnia, unspecified: Secondary | ICD-10-CM

## 2011-04-05 ENCOUNTER — Telehealth: Payer: Self-pay | Admitting: Cardiology

## 2011-04-05 DIAGNOSIS — R0602 Shortness of breath: Secondary | ICD-10-CM

## 2011-04-05 DIAGNOSIS — R600 Localized edema: Secondary | ICD-10-CM

## 2011-04-05 NOTE — Telephone Encounter (Signed)
Has been taking fluid pill (furosemide 20 mg) daily and swelling in feet not going down.  Swelling does not go down at night, started end of November.  Also, since starting this furosemide has had decreased energy.  Is concerned he potassium is low.

## 2011-04-05 NOTE — Telephone Encounter (Signed)
New problem Pt was calling about her taking fluid pill and also taking potassium. please call her back

## 2011-04-05 NOTE — Telephone Encounter (Signed)
Advised and scheduled labs  

## 2011-04-05 NOTE — Telephone Encounter (Signed)
Refills on halcion and pamelor

## 2011-04-05 NOTE — Telephone Encounter (Signed)
Have the patient come in for a B. natruretic peptide and a basal metabolic panel to assess the need for ongoing Lasix.

## 2011-04-06 ENCOUNTER — Encounter: Payer: Self-pay | Admitting: Nurse Practitioner

## 2011-04-06 ENCOUNTER — Ambulatory Visit (INDEPENDENT_AMBULATORY_CARE_PROVIDER_SITE_OTHER): Payer: Medicare Other | Admitting: Nurse Practitioner

## 2011-04-06 ENCOUNTER — Other Ambulatory Visit: Payer: Medicare Other | Admitting: *Deleted

## 2011-04-06 VITALS — BP 150/82 | HR 100 | Ht 62.75 in | Wt 162.4 lb

## 2011-04-06 DIAGNOSIS — R600 Localized edema: Secondary | ICD-10-CM

## 2011-04-06 DIAGNOSIS — R000 Tachycardia, unspecified: Secondary | ICD-10-CM

## 2011-04-06 DIAGNOSIS — R0602 Shortness of breath: Secondary | ICD-10-CM

## 2011-04-06 DIAGNOSIS — R609 Edema, unspecified: Secondary | ICD-10-CM | POA: Diagnosis not present

## 2011-04-06 MED ORDER — FUROSEMIDE 20 MG PO TABS: ORAL_TABLET | ORAL | Status: DC

## 2011-04-06 NOTE — Progress Notes (Signed)
Rande Lawman Date of Birth: [birth year] Medical Record #[id number]  History of Present Illness: Ms. Gaydos is seen today for a work in visit. She is seen for Dr. Patty Sermons. She was here getting her labs checked and wanted to see if someone could check her feet. She saw Dr. Patty Sermons about a month ago. Had had worsening edema after a trip to the beach and was started on low dose Lasix. It is a little better but not as good as she would like. She always has some degree of dyspnea and says this is at baseline. No cough. No hemoptysis. No fever or chills. No chest pain. She did try some support stockings but wore them at night. The stockings got too tight and she had to take them off. She has not tried them again. She is unaware of her heart beating fast.   Current Outpatient Prescriptions on File Prior to Visit  Medication Sig Dispense Refill  . Acetaminophen (TYLENOL ARTHRITIS PAIN PO) Take by mouth 2 (two) times daily.        Marland Kitchen ALPRAZolam (XANAX) 1 MG tablet Take 1 mg by mouth 2 (two) times daily.        Marland Kitchen BONIVA 150 MG tablet TAKE ONE TABLET ONCE A MONTH IN THE MORNING ON AN EMPTY STOMACH WITH WATER. REMAIN UPRIGHT.  30 each  5  . Calcium Carbonate (CALCIUM 500 PO) Take by mouth daily.        . Cholecalciferol (VITAMIN D PO) Take by mouth. Taking 2000 daily       . Cyanocobalamin (VITAMIN B 12 PO) Take by mouth. Taking 1000 daily       . HALCION 0.25 MG tablet TAKE 1 TABLET AT BEDTIME AS NEEDED.  30 each  5  . ibuprofen (ADVIL,MOTRIN) 100 MG tablet Take 100 mg by mouth every 6 (six) hours as needed.        Marland Kitchen LASIX 20 MG tablet TAKE 1 TABLET ONCE DAILY AS NEEDED FOR FLUID.  30 each  11  . Multiple Vitamin (MULTIVITAMINS PO) Take by mouth daily.        . nitroGLYCERIN (NITROSTAT) 0.4 MG SL tablet Place 1 tablet (0.4 mg total) under the tongue every 5 (five) minutes as needed for chest pain.  25 tablet  PRN  . PAMELOR 25 MG capsule TAKE (1) CAPSULE DAILY.  30 each  11  . perphenazine  (TRILAFON) 2 MG tablet Take 1 tablet (2 mg total) by mouth at bedtime.  30 tablet  2  . polyethylene glycol (MIRALAX / GLYCOLAX) packet Take 17 g by mouth daily. As needed       . VESICARE 10 MG tablet TAKE 1 TABLET ONCE DAILY.  30 each  11  . ALPRAZolam (XANAX) 1 MG tablet Take 1 tablet (1 mg total) by mouth 2 (two) times daily as needed for sleep or anxiety.  60 tablet  2  . ALPRAZolam (XANAX) 1 MG tablet Take 1 tablet (1 mg total) by mouth at bedtime as needed for sleep.  60 tablet  5    Allergies  Allergen Reactions  . Celebrex (Celecoxib)     rash  . Latex   . Lipitor (Atorvastatin Calcium)     Leg weakness,memory loss  . Lovastatin     Leg weakness    Past Medical History  Diagnosis Date  . Osteoarthritis   . Bladder incontinence   . Constipation     hx of  . Hypertension   . Hyperlipidemia   .  Chest pain     S/P nuclear 07/17/2008  normal; EF 88% with no ischemia  . Palpitation   . Edema     Past Surgical History  Procedure Date  . Appendectomy     age 64  . Tubal ligation     History  Smoking status  . Never Smoker   Smokeless tobacco  . Not on file    History  Alcohol Use No    History reviewed. No pertinent family history.  Review of Systems: The review of systems is positive for unsteadiness. No falls. No chest pain. Shortness of breath is at baseline.  She says she has red streaks in her legs/feet. All other systems were reviewed and are negative.  Physical Exam: BP 150/82  Pulse 100  Ht 5' 2.75" (1.594 m)  Wt 162 lb 6.4 oz (73.664 kg)  BMI 29.00 kg/m2 Patient is very pleasant and in no acute distress. She is overweight. Skin is warm and dry. Color is normal.  HEENT is unremarkable. Normocephalic/atraumatic. PERRL. Sclera are nonicteric. Neck is supple. No masses. No JVD. Lungs are fairly clear. Cardiac exam shows a regular rate and rhythm. She is tachycardic today. Abdomen is obese but soft. Extremities are with 1+ edema L>R. Her legs are not  hot to touch. I did not appreciate any red streaks. Gait and ROM are intact. No gross neurologic deficits noted.   LABORATORY DATA: PENDING.  EKG today shows sinus. Her rate is slower at 92.   Assessment / Plan:

## 2011-04-06 NOTE — Assessment & Plan Note (Signed)
Heart rate is slower by EKG. She did have to hurry and had a long walk from the parking deck.

## 2011-04-06 NOTE — Patient Instructions (Addendum)
Try the support stockings in the day time to help with the swelling. We are going to get an ultrasound of your legs to make sure you do not have blood clots.  You may increase the Lasix to 40 mg daily for one week. Then go back to just 20 mg each morning.    We will be checking your labs today.  Elevate your legs.   Keep your next appointment with Dr. Patty Sermons in March.  Call the [facility] office at [phone] if you have any questions, problems or concerns.

## 2011-04-06 NOTE — Progress Notes (Signed)
Addended by: Royanne Foots on: 04/06/2011 03:33 PM   Modules accepted: Orders

## 2011-04-06 NOTE — Assessment & Plan Note (Addendum)
She is using low dose Lasix. We will be checking labs today. I have increased it to 40 mg daily for just one week and then go back to 20 mg. We will check venous dopplers. I have encouraged her to use the support stockings in the day time. We will see her back at her regular time. Patient is agreeable to this plan and will call if any problems develop in the interim.

## 2011-04-07 LAB — BASIC METABOLIC PANEL
BUN: 19 mg/dL (ref 6–23)
CO2: 29 mEq/L (ref 19–32)
Chloride: 99 mEq/L (ref 96–112)
Creatinine, Ser: 0.9 mg/dL (ref 0.4–1.2)
Potassium: 5.2 mEq/L — ABNORMAL HIGH (ref 3.5–5.1)

## 2011-04-07 LAB — BRAIN NATRIURETIC PEPTIDE: Pro B Natriuretic peptide (BNP): 21 pg/mL (ref 0.0–100.0)

## 2011-04-08 ENCOUNTER — Telehealth: Payer: Self-pay | Admitting: *Deleted

## 2011-04-08 NOTE — Telephone Encounter (Signed)
Message copied by Burnell Blanks on Thu Apr 08, 2011  5:05 PM ------      Message from: Cassell Clement      Created: Wed Apr 07, 2011 12:38 PM       Her kidney function is normal.. the B. natruretic peptide level is low so she is not in congestive heart failure.  Continue with Lasix as outlined by Lawson Fiscal [email or web address].  Her edema is most likely secondary to noncardiac factors such as venous insufficiency and wearing support hose should help.

## 2011-04-08 NOTE — Telephone Encounter (Signed)
Advised of labs 

## 2011-04-20 ENCOUNTER — Other Ambulatory Visit: Payer: Self-pay | Admitting: Cardiology

## 2011-04-20 NOTE — Telephone Encounter (Signed)
She should stay on the same dose of Lasix.  The referring does not sound cardiac.  It is probably a superficial nerve or spasm in the chest wall.  Does not require specific treatment.

## 2011-04-20 NOTE — Telephone Encounter (Signed)
1)Has had swelling in her eye lids when she wakes up and still having swelling her legs and feet.  Is back on the Lasix just one a day.  Should she increase on continue on current dose. Did cancel her dopplers secondary to just getting over a "bug" 2)has had a "quivvering feeling" left side of chest under her arm and it last for only a minute.  Does feel external, almost right under the skin and would like to know what you think this might be

## 2011-04-21 ENCOUNTER — Encounter: Payer: Medicare Other | Admitting: Cardiology

## 2011-04-22 ENCOUNTER — Other Ambulatory Visit: Payer: Self-pay | Admitting: Cardiology

## 2011-04-22 ENCOUNTER — Other Ambulatory Visit: Payer: Self-pay | Admitting: *Deleted

## 2011-04-22 NOTE — Telephone Encounter (Signed)
Opened in Error.

## 2011-04-26 ENCOUNTER — Other Ambulatory Visit: Payer: Self-pay | Admitting: Cardiology

## 2011-04-26 DIAGNOSIS — F419 Anxiety disorder, unspecified: Secondary | ICD-10-CM

## 2011-04-26 NOTE — Telephone Encounter (Signed)
Refilled xanax

## 2011-04-29 ENCOUNTER — Telehealth: Payer: Self-pay | Admitting: *Deleted

## 2011-04-29 NOTE — Telephone Encounter (Signed)
  Cassell Clement, MD 04/20/2011 6:22 PM Signed  She should stay on the same dose of Lasix.  The referring does not sound cardiac. It is probably a superficial nerve or spasm in the chest wall. Does not require specific treatment. Regis Bill, LPN 07/05/8117 1:47 PM Signed  1)Has had swelling in her eye lids when she wakes up and still having swelling her legs and feet. Is back on the Lasix just one a day. Should she increase on continue on current dose. Did cancel her dopplers secondary to just getting over a "bug"  2)has had a "quivvering feeling" left side of chest under her arm and it last for only a minute. Does feel external, almost right under the skin and would like to know what you think this might be    Patient was advised on 1/22

## 2011-05-05 ENCOUNTER — Telehealth: Payer: Self-pay | Admitting: Cardiology

## 2011-05-05 NOTE — Telephone Encounter (Signed)
Advised patient to increase water, water support stockings, frequent stops on trip to beach, avoid sodium intake, and continue furosemide. Verbalized understanding

## 2011-05-05 NOTE — Telephone Encounter (Signed)
New Problem   Patient would like a return call regarding fluid in her feet, she can be reached at hm#

## 2011-05-06 ENCOUNTER — Encounter: Payer: Medicare Other | Admitting: Cardiology

## 2011-05-11 ENCOUNTER — Encounter (INDEPENDENT_AMBULATORY_CARE_PROVIDER_SITE_OTHER): Payer: Medicare Other | Admitting: Cardiology

## 2011-05-11 DIAGNOSIS — M79609 Pain in unspecified limb: Secondary | ICD-10-CM

## 2011-05-11 DIAGNOSIS — R609 Edema, unspecified: Secondary | ICD-10-CM | POA: Diagnosis not present

## 2011-05-12 ENCOUNTER — Telehealth: Payer: Self-pay | Admitting: *Deleted

## 2011-05-12 ENCOUNTER — Telehealth: Payer: Self-pay | Admitting: Cardiology

## 2011-05-12 ENCOUNTER — Ambulatory Visit: Payer: Medicare Other | Admitting: Cardiology

## 2011-05-12 NOTE — Telephone Encounter (Signed)
Avoids salt, elevates feet, and uses Lasix unless she is going out.  Didn't take yesterday since she had to get doppler.  Yesterday they were good and today they are swollen again.  Just can't get the hose around toes secondary to arthritis.  Are there any other options?

## 2011-05-12 NOTE — Telephone Encounter (Signed)
?   Ok to use extra tablet as needed?

## 2011-05-12 NOTE — Telephone Encounter (Signed)
New Msg: Pt calling wanting to speak with [facility]. Pt stated that she has bought two pair of support hose for pt swollen feet and pt stated she is not able to get them on. Pt stated they are just too strong. Pt wants to know if there is anything else pt should do to reduce the swelling other than elevate pt feet and/or take an extra diuretic. Please return pt call to discuss further.

## 2011-05-12 NOTE — Telephone Encounter (Signed)
Advised of doppler results

## 2011-05-12 NOTE — Telephone Encounter (Signed)
Message copied by Burnell Blanks on Wed May 12, 2011  4:39 PM ------      Message from: Cassell Clement      Created: Wed May 12, 2011  2:11 PM       Please report.  No evidence of blood clots to explain her leg symptoms.

## 2011-05-12 NOTE — Telephone Encounter (Signed)
Okay to take extra tablet of Lasix as necessary for leg swelling

## 2011-05-13 NOTE — Telephone Encounter (Signed)
Advised patient

## 2011-05-17 ENCOUNTER — Other Ambulatory Visit: Payer: Self-pay | Admitting: Cardiology

## 2011-05-17 ENCOUNTER — Encounter: Payer: Medicare Other | Admitting: Cardiology

## 2011-05-17 NOTE — Telephone Encounter (Signed)
Refilled trilafon 

## 2011-06-02 ENCOUNTER — Ambulatory Visit (INDEPENDENT_AMBULATORY_CARE_PROVIDER_SITE_OTHER): Payer: Medicare Other | Admitting: Internal Medicine

## 2011-06-02 ENCOUNTER — Encounter: Payer: Self-pay | Admitting: Internal Medicine

## 2011-06-02 DIAGNOSIS — M1990 Unspecified osteoarthritis, unspecified site: Secondary | ICD-10-CM

## 2011-06-02 DIAGNOSIS — R609 Edema, unspecified: Secondary | ICD-10-CM | POA: Diagnosis not present

## 2011-06-02 DIAGNOSIS — M810 Age-related osteoporosis without current pathological fracture: Secondary | ICD-10-CM | POA: Diagnosis not present

## 2011-06-02 DIAGNOSIS — F411 Generalized anxiety disorder: Secondary | ICD-10-CM

## 2011-06-02 DIAGNOSIS — N3946 Mixed incontinence: Secondary | ICD-10-CM | POA: Insufficient documentation

## 2011-06-02 DIAGNOSIS — F419 Anxiety disorder, unspecified: Secondary | ICD-10-CM

## 2011-06-02 HISTORY — DX: Age-related osteoporosis without current pathological fracture: M81.0

## 2011-06-02 NOTE — Assessment & Plan Note (Signed)
On vesicare - symptoms improved with tx The current medical regimen is effective;  continue present plan and medications.

## 2011-06-02 NOTE — Assessment & Plan Note (Signed)
Previously managed by rheum - pt denies hx fractures On weekly fosamax for years, changed to monthly Boniva in ?2010 Continue OTC ca+D - check DEXA now The current medical regimen is effective;  continue present plan and medications.  

## 2011-06-02 NOTE — Assessment & Plan Note (Signed)
Continue daily Xanax as has been on same chronically for years. Also Pamelor and Halicon for sleep, no change recommended  

## 2011-06-02 NOTE — Patient Instructions (Signed)
It was good to see you today. We have reviewed your prior records including labs and tests today Medications reviewed: Aleve and ibuprofen/Advil - use Tylenol arthritis twice daily for arthritis symptoms instead of Aleve or ibuprofen - this should help your ankle and leg swelling. Continue low-salt diet, compression support hose as tolerated and leg elevation as discussed we'll make referral for bone density test here . Our office will contact you regarding appointment(s) once made. Other medications reviewed, no other changes recommended at this time Please schedule followup in 6 months, call sooner if problems.

## 2011-06-02 NOTE — Progress Notes (Signed)
Subjective:    Patient ID: Sarah [name], female    DOB: [birth year], 78 y.o.   MRN: [id number]  HPI New patient to me but known to our cardiology division and GI, here today to establish with primary care provider Review chronic medical issues today:  Osteoporosis. Previously followed with rheumatology for same. Denies history fracture. No back pain or new bone pain.Marland Kitchen took weekly generic Fosamax for years, changed to [facility] in approximately 2010. Compliant with medications monthly and daily calcium plus vitamin D. Active weightbearing exercise as tolerated by arthritis, see next  Osteoarthritis. Diffuse including knees, low back and hands. Takes over-the-counter NSAIDs such as ibuprofen or Aleve on a scheduled basis daily. Occasional hydrocodone as needed for severe pain symptoms. Has worked with outpatient physical therapy. Denies any acute joint swelling or recent injury  Bilateral feet edema. Ongoing since November 2012 chronically. Previously experienced edema only episodic related to beach travel and excessive salt consumption. In the past 3 months, chronic swelling of lower extremity and ankles/feet. Has been evaluated by cardiology for same. Slightly improved but not resolved with daily Lasix. Noncompliant with compression hose due to discomfort of fit.  Anxiety. Takes Xanax twice daily scheduled. Also associated with chronic insomnia for which she takes [employer]  Past Medical History  Diagnosis Date  . Osteoarthritis   . Bladder incontinence   . Constipation     hx of  . Hypertension   . Hyperlipidemia   . Chest pain     S/P nuclear 07/17/2008  normal; EF 88% with no ischemia  . Chronic venous insufficiency     BLE edema, chronic, neg venous US 2/13  . History of chicken pox   . Depression   . Diverticulitis   . Polyp of colon   . Osteoporosis, postmenopausal 06/02/2011   Family History  Problem Relation Age of Onset  . Cancer Son     testicular cancer    History  Substance Use Topics  . Smoking status: Never Smoker   . Smokeless tobacco: Not on file  . Alcohol Use: No   widowed 2004. Lives alone. support of nearby family and friends. Son in [place] and daughter in [place]. Frequent travel to beach with brother and sister for vacation 3-4 times yearly.  Review of Systems Constitutional: Negative for fever or weight change.  Respiratory: Negative for cough and shortness of breath.   Cardiovascular: Negative for chest pain or palpitations.  Gastrointestinal: Negative for abdominal pain, no bowel changes.  Musculoskeletal: Negative for gait problem or joint swelling.  Skin: Negative for rash.  Neurological: Negative for dizziness or headache.  No other specific complaints in a complete review of systems (except as listed in HPI above).     Objective:   Physical Exam BP 112/60  Pulse 90  Temp(Src) 98.5 F (36.9 C) (Oral)  Ht 5\' 3"  (1.6 m)  Wt 165 lb 1.9 oz (74.898 kg)  BMI 29.25 kg/m2  SpO2 98% Wt Readings from Last 3 Encounters:  06/02/11 165 lb 1.9 oz (74.898 kg)  04/06/11 162 lb 6.4 oz (73.664 kg)  03/08/11 159 lb (72.122 kg)   Constitutional: She is elderly but spry and fit, appears well-developed and well-nourished. No distress.  Neck: Normal range of motion. Neck supple. No JVD present. No thyromegaly present.  Cardiovascular: Normal rate, regular rhythm and normal heart sounds.  No murmur heard. 1+ nonpitting BLE edema feet to below knee. Pulmonary/Chest: Effort normal and breath sounds normal. No respiratory distress. She  has no wheezes.  Musculoskeletal: OA change B hands and knees -but normal range of motion, no joint effusions. No gross deformities Neurological: She is alert and oriented to person, place, and time. No cranial nerve deficit. Coordination normal.  Skin: Skin is warm and dry. No rash noted. No erythema.  Psychiatric: She has a normal mood and affect. Her behavior is normal. Judgment and thought  content normal.   Lab Results  Component Value Date   WBC 5.2 10/21/2010   HGB 12.2 10/21/2010   HCT 36.3 10/21/2010   PLT 272.0 10/21/2010   GLUCOSE 92 04/06/2011   CHOL 194 02/02/2011   TRIG 131.0 02/02/2011   HDL 82.60 02/02/2011   LDLDIRECT 101.1 10/21/2010   LDLCALC 85 02/02/2011   ALT 16 02/02/2011   AST 14 02/02/2011   NA 135 04/06/2011   K 5.2* 04/06/2011   CL 99 04/06/2011   CREATININE 0.9 04/06/2011   BUN 19 04/06/2011   CO2 29 04/06/2011       Assessment & Plan:   Time spent with pt today 45 minutes, greater than 50% time spent counseling patient on edema, osteoporosis and medication review. Also review of prior records in EMR

## 2011-06-02 NOTE — Assessment & Plan Note (Signed)
As above for tx edema, will try change in scheduled over-the-counter NSAIDs to scheduled Tylenol x 2 weeks Continue hydrocodone as needed, rare use reviewed Further treatment to depend on outcome of this trial regarding possible improved edema versus control of arthritis symptoms

## 2011-06-02 NOTE — Assessment & Plan Note (Signed)
Chronic lymphedema, BLE - no evidence of kidney, liver or heart problems on recent labs -  also neg DVT on venous US 04/2011 -  will change NSAIDs to tylenol arthritis for OA also encouraged compliance with support hose as recommended -  ok to use lasix as needed and follow up with cards

## 2011-06-08 ENCOUNTER — Telehealth: Payer: Self-pay | Admitting: Cardiology

## 2011-06-08 NOTE — Telephone Encounter (Signed)
Had a "spell" earlier today with her head feeling full.  Stated she drank a lot of water and took some Tylenol and was feeling better now.  Will keep her appointment in the morning

## 2011-06-08 NOTE — Telephone Encounter (Signed)
Patient return call to patient at hm# [phone]  Patient has questions about her upcoming appnt 3/13 @ 10AM

## 2011-06-09 ENCOUNTER — Other Ambulatory Visit: Payer: Medicare Other

## 2011-06-09 ENCOUNTER — Encounter: Payer: Medicare Other | Admitting: Cardiology

## 2011-06-09 ENCOUNTER — Encounter: Payer: Self-pay | Admitting: Cardiology

## 2011-06-09 ENCOUNTER — Ambulatory Visit (INDEPENDENT_AMBULATORY_CARE_PROVIDER_SITE_OTHER): Payer: Medicare Other | Admitting: Cardiology

## 2011-06-09 VITALS — BP 120/70 | HR 90 | Ht 63.0 in | Wt 161.0 lb

## 2011-06-09 DIAGNOSIS — F411 Generalized anxiety disorder: Secondary | ICD-10-CM

## 2011-06-09 DIAGNOSIS — E785 Hyperlipidemia, unspecified: Secondary | ICD-10-CM

## 2011-06-09 DIAGNOSIS — R000 Tachycardia, unspecified: Secondary | ICD-10-CM

## 2011-06-09 DIAGNOSIS — R609 Edema, unspecified: Secondary | ICD-10-CM

## 2011-06-09 DIAGNOSIS — F419 Anxiety disorder, unspecified: Secondary | ICD-10-CM

## 2011-06-09 LAB — CBC WITH DIFFERENTIAL/PLATELET
Basophils Absolute: 0 10*3/uL (ref 0.0–0.1)
HCT: 36.2 % (ref 36.0–46.0)
Hemoglobin: 11.9 g/dL — ABNORMAL LOW (ref 12.0–15.0)
Lymphs Abs: 1.3 10*3/uL (ref 0.7–4.0)
MCV: 95.4 fl (ref 78.0–100.0)
Monocytes Absolute: 0.9 10*3/uL (ref 0.1–1.0)
Monocytes Relative: 11.5 % (ref 3.0–12.0)
Neutro Abs: 5.2 10*3/uL (ref 1.4–7.7)
RDW: 13.7 % (ref 11.5–14.6)

## 2011-06-09 LAB — LIPID PANEL: Cholesterol: 166 mg/dL (ref 0–200)

## 2011-06-09 LAB — HEPATIC FUNCTION PANEL
ALT: 20 U/L (ref 0–35)
AST: 14 U/L (ref 0–37)
Albumin: 3.7 g/dL (ref 3.5–5.2)

## 2011-06-09 LAB — BASIC METABOLIC PANEL
CO2: 27 mEq/L (ref 19–32)
Chloride: 100 mEq/L (ref 96–112)
GFR: 64.42 mL/min (ref >60.00)
Glucose, Bld: 102 mg/dL — ABNORMAL HIGH (ref 70–99)
Potassium: 4.7 mEq/L (ref 3.5–5.1)
Sodium: 134 mEq/L — ABNORMAL LOW (ref 135–145)

## 2011-06-09 NOTE — Progress Notes (Signed)
Sarah [name] Date of Birth:  May 10, 1926 [facility] [address] [place], [place]  [address] [phone]         Fax   [phone]  History of Present Illness: This pleasant 78 year old woman is seen for a scheduled followup office visit.  She comes in and out of concern over swelling of the left side of her body.  She feels that her left eyelids as well as the fingers of her left hand and her left foot have been swelling out of proportion to the right side of her body.  She does not sleep on her left side particularly.  She sleeps on her side has not been experiencing any chest pain to suggest angina.  She rarely has to take any sublingual nitroglycerin.  The etiology of her swelling is not clear.  A recent B. natruretic peptide was quite low.  She has not tolerated trial of empiric higher dose Lasix because of worsening headaches and malaise and fatigue.  Presently she is on 20 mg Lasix daily.  She has a past history of atypical chest pain and had a nuclear stress test in April 2012 which showed no ischemia and her ejection fraction was noted to 88%.  She has a history of hypercholesterolemia but is intolerant of statins because of leg weakness and memory loss  Current Outpatient Prescriptions  Medication Sig Dispense Refill  . Acetaminophen (TYLENOL ARTHRITIS PAIN PO) Take by mouth 2 (two) times daily.        Marland Kitchen BONIVA 150 MG tablet TAKE ONE TABLET ONCE A MONTH IN THE MORNING ON AN EMPTY STOMACH WITH WATER. REMAIN UPRIGHT.  30 each  5  . Calcium Carbonate (CALCIUM 500 PO) Take by mouth daily.        . Cholecalciferol (VITAMIN D PO) Take by mouth. Taking 2000 daily       . estradiol (ESTRACE VAGINAL) 0.1 MG/GM vaginal cream Place 0.25 Applicatorfuls vaginally 2 (two) times a week.  42.5 g  12  . furosemide (LASIX) 20 MG tablet May take two tablets for one week and then go back to one each day.  30 tablet  6  . HALCION 0.25 MG tablet TAKE 1 TABLET AT BEDTIME AS NEEDED.   30 each  5  . HYDROcodone-acetaminophen (VICODIN) 5-500 MG per tablet Take 1 tablet by mouth daily as needed.      . nitroGLYCERIN (NITROSTAT) 0.4 MG SL tablet Place 1 tablet (0.4 mg total) under the tongue every 5 (five) minutes as needed for chest pain.  25 tablet  PRN  . NON FORMULARY Emergen-C use 1 packet daily      . PAMELOR 25 MG capsule TAKE (1) CAPSULE DAILY.  30 each  11  . perphenazine (TRILAFON) 2 MG tablet TAKE ONE TABLET AT BEDTIME.  30 tablet  5  . polyethylene glycol (MIRALAX / GLYCOLAX) packet Take 17 g by mouth daily. As needed       . VESICARE 10 MG tablet TAKE 1 TABLET ONCE DAILY.  30 each  11  . XANAX 1 MG tablet TAKE 1 TABLET TWICE DAILY.  60 each  5    Allergies  Allergen Reactions  . Celebrex (Celecoxib)     rash  . Latex   . Lipitor (Atorvastatin Calcium)     Leg weakness,memory loss  . Lovastatin     Leg weakness    Patient Active Problem List  Diagnoses  . Osteoarthritis  . Chest pain  . Edema  .  Tachycardia  . Osteoporosis, postmenopausal  . Anxiety  . Mixed incontinence urge and stress    History  Smoking status  . Never Smoker   Smokeless tobacco  . Not on file    History  Alcohol Use No    Family History  Problem Relation Age of Onset  . Cancer Son     testicular cancer    Review of Systems: Constitutional: no fever chills diaphoresis or fatigue or change in weight.  Head and neck: no hearing loss, no epistaxis, no photophobia or visual disturbance. Respiratory: No cough, shortness of breath or wheezing. Cardiovascular: No chest pain peripheral edema, palpitations. Gastrointestinal: No abdominal distention, no abdominal pain, no change in bowel habits hematochezia or melena. Genitourinary: No dysuria, no frequency, no urgency, no nocturia. Musculoskeletal:No arthralgias, no back pain, no gait disturbance or myalgias. Neurological: No dizziness, no headaches, no numbness, no seizures, no syncope, no weakness, no  tremors. Hematologic: No lymphadenopathy, no easy bruising. Psychiatric: No confusion, no hallucinations, no sleep disturbance.    Physical Exam: Filed Vitals:   06/09/11 1007  BP: 120/70  Pulse: 90   the general appearance reveals a well-developed well-nourished woman in no distress.  She is concerned about swelling of the eyelids but her examination does not reveal any significant problem in that regard.The chest is clear to percussion and auscultation. There are no rales or rhonchi. Expansion of the chest is symmetrical.  The precordium is quiet.  The first heart sound is normal.  The second heart sound is physiologically split.  There is no murmur gallop rub or click.  There is no abnormal lift or heave.  The abdomen is soft and nontender. Bowel sounds are normal. The liver and spleen are not enlarged. There Are no abdominal masses. There are no bruits.  Extremities reveal just trace ankle edemaStrength is normal and symmetrical in all extremities.  There is no lateralizing weakness.  There are no sensory deficits.  The skin is warm and dry.  There is no rash.    Assessment / Plan:  The patient does not appear to be overloaded with fluid at the present time.  We are going to check lab work today including a CBC basal metabolic panel and hepatic function panel to be sure that there is not some other cause for her complaints.   for the time being she will continue Lasix 20 mg daily.   should be rechecked in 3 months for followup office visit

## 2011-06-09 NOTE — Assessment & Plan Note (Signed)
Her edema is really minimal today and her weight is actually down 1 pound since last visit.  She recently returned from the beach where she drank only bottled water.

## 2011-06-09 NOTE — Progress Notes (Signed)
Quick Note:  Please report to patient. The recent labs are stable. Continue same medication and careful diet. ______ 

## 2011-06-09 NOTE — Patient Instructions (Signed)
Will obtain labs today and call you with the results (CBC/BMET/HFP) Your physician recommends that you continue on your current medications as directed. Please refer to the Current Medication list given to you today. Your physician recommends that you schedule a follow-up appointment in: 3 months

## 2011-06-09 NOTE — Assessment & Plan Note (Signed)
The patient has a lot of chronic anxiety and remains quite anxious

## 2011-06-09 NOTE — Assessment & Plan Note (Signed)
The patient has occasional sensation of palpitations and tachycardia.

## 2011-06-11 ENCOUNTER — Telehealth: Payer: Self-pay | Admitting: Cardiology

## 2011-06-11 NOTE — Telephone Encounter (Signed)
Fu call Pt wants to know test results Please call

## 2011-06-11 NOTE — Telephone Encounter (Signed)
Pt notified of lab results

## 2011-06-14 DIAGNOSIS — H524 Presbyopia: Secondary | ICD-10-CM | POA: Diagnosis not present

## 2011-06-14 DIAGNOSIS — H04129 Dry eye syndrome of unspecified lacrimal gland: Secondary | ICD-10-CM | POA: Diagnosis not present

## 2011-06-14 DIAGNOSIS — H2510 Age-related nuclear cataract, unspecified eye: Secondary | ICD-10-CM | POA: Diagnosis not present

## 2011-06-16 ENCOUNTER — Ambulatory Visit (INDEPENDENT_AMBULATORY_CARE_PROVIDER_SITE_OTHER)
Admission: RE | Admit: 2011-06-16 | Discharge: 2011-06-16 | Disposition: A | Discharge status: Home / Self Care | Payer: Medicare Other | Source: Ambulatory Visit

## 2011-06-16 DIAGNOSIS — M810 Age-related osteoporosis without current pathological fracture: Secondary | ICD-10-CM | POA: Diagnosis not present

## 2011-06-17 ENCOUNTER — Telehealth: Payer: Self-pay | Admitting: *Deleted

## 2011-06-17 NOTE — Telephone Encounter (Signed)
Message copied by Burnell Blanks on Thu Jun 17, 2011  9:19 AM ------      Message from: Cassell Clement      Created: Wed Jun 09, 2011  8:33 PM       Please report to patient.  The recent labs are stable. Continue same medication and careful diet.

## 2011-06-17 NOTE — Telephone Encounter (Signed)
Advised patient yesterday and mailed copy of labs

## 2011-06-28 ENCOUNTER — Encounter: Payer: Self-pay | Admitting: Internal Medicine

## 2011-06-29 ENCOUNTER — Other Ambulatory Visit: Payer: Self-pay | Admitting: Obstetrics and Gynecology

## 2011-06-29 ENCOUNTER — Other Ambulatory Visit: Payer: Self-pay | Admitting: *Deleted

## 2011-06-29 DIAGNOSIS — Z1231 Encounter for screening mammogram for malignant neoplasm of breast: Secondary | ICD-10-CM

## 2011-06-29 MED ORDER — IBANDRONATE SODIUM 150 MG PO TABS: 150.0000 mg | ORAL_TABLET | ORAL | Status: DC

## 2011-06-29 NOTE — Telephone Encounter (Signed)
Called pt concerning results back from bone density. Pt states she needing refills on boniva. Inform pt will send to [facility]... 06/29/11@2 :36pm/LMB

## 2011-07-27 ENCOUNTER — Ambulatory Visit
Admission: RE | Admit: 2011-07-27 | Discharge: 2011-07-27 | Disposition: A | Discharge status: Home / Self Care | Payer: Medicare Other | Source: Ambulatory Visit | Attending: Obstetrics and Gynecology | Admitting: Obstetrics and Gynecology

## 2011-07-27 DIAGNOSIS — Z1231 Encounter for screening mammogram for malignant neoplasm of breast: Secondary | ICD-10-CM | POA: Diagnosis not present

## 2011-08-12 ENCOUNTER — Telehealth: Payer: Self-pay | Admitting: Cardiology

## 2011-08-12 NOTE — Telephone Encounter (Signed)
Spoke with patient and she will bring copy of living will when she comes for next appointment.Patient will have form sent for brand name halcion since generic made her have nightmares.

## 2011-08-12 NOTE — Telephone Encounter (Signed)
Pt calling re living will and rx halcion pls call

## 2011-08-30 ENCOUNTER — Other Ambulatory Visit: Payer: Self-pay | Admitting: Cardiology

## 2011-08-30 DIAGNOSIS — G4700 Insomnia, unspecified: Secondary | ICD-10-CM

## 2011-08-30 NOTE — Telephone Encounter (Signed)
Pt needs refill

## 2011-09-14 ENCOUNTER — Ambulatory Visit: Payer: Medicare Other | Admitting: Cardiology

## 2011-09-15 ENCOUNTER — Other Ambulatory Visit: Payer: Self-pay | Admitting: *Deleted

## 2011-09-16 ENCOUNTER — Other Ambulatory Visit (HOSPITAL_COMMUNITY): Payer: Self-pay | Admitting: *Deleted

## 2011-09-16 DIAGNOSIS — F419 Anxiety disorder, unspecified: Secondary | ICD-10-CM

## 2011-09-16 NOTE — Telephone Encounter (Signed)
Refill on alprazolam 

## 2011-09-16 NOTE — Telephone Encounter (Signed)
[facility] wants get this done today before 11am

## 2011-09-19 MED ORDER — ALPRAZOLAM 1 MG PO TABS: 1.0000 mg | ORAL_TABLET | Freq: Two times a day (BID) | ORAL | Status: DC

## 2011-09-27 DIAGNOSIS — L259 Unspecified contact dermatitis, unspecified cause: Secondary | ICD-10-CM | POA: Diagnosis not present

## 2011-09-27 DIAGNOSIS — L820 Inflamed seborrheic keratosis: Secondary | ICD-10-CM | POA: Diagnosis not present

## 2011-09-29 ENCOUNTER — Ambulatory Visit: Payer: Medicare Other | Admitting: Cardiology

## 2011-10-01 ENCOUNTER — Other Ambulatory Visit: Payer: Self-pay | Admitting: Cardiology

## 2011-10-01 ENCOUNTER — Other Ambulatory Visit: Payer: Self-pay | Admitting: *Deleted

## 2011-10-01 DIAGNOSIS — F329 Major depressive disorder, single episode, unspecified: Secondary | ICD-10-CM

## 2011-10-01 NOTE — Telephone Encounter (Signed)
Refilled pamelor 25mg .

## 2011-10-04 MED ORDER — NORTRIPTYLINE HCL 25 MG PO CAPS: 25.0000 mg | ORAL_CAPSULE | Freq: Every day | ORAL | Status: DC

## 2011-10-08 ENCOUNTER — Ambulatory Visit: Payer: Medicare Other | Admitting: Cardiology

## 2011-10-18 ENCOUNTER — Other Ambulatory Visit: Payer: Self-pay | Admitting: *Deleted

## 2011-10-18 DIAGNOSIS — F419 Anxiety disorder, unspecified: Secondary | ICD-10-CM

## 2011-10-18 MED ORDER — ALPRAZOLAM 1 MG PO TABS: 1.0000 mg | ORAL_TABLET | Freq: Two times a day (BID) | ORAL | Status: DC

## 2011-10-18 NOTE — Telephone Encounter (Signed)
Refill on alprazolam 

## 2011-10-26 DIAGNOSIS — L820 Inflamed seborrheic keratosis: Secondary | ICD-10-CM | POA: Diagnosis not present

## 2011-10-26 DIAGNOSIS — N760 Acute vaginitis: Secondary | ICD-10-CM | POA: Diagnosis not present

## 2011-10-26 DIAGNOSIS — L821 Other seborrheic keratosis: Secondary | ICD-10-CM | POA: Diagnosis not present

## 2011-11-05 ENCOUNTER — Ambulatory Visit (INDEPENDENT_AMBULATORY_CARE_PROVIDER_SITE_OTHER): Payer: Medicare Other | Admitting: Cardiology

## 2011-11-05 ENCOUNTER — Encounter: Payer: Self-pay | Admitting: Cardiology

## 2011-11-05 VITALS — BP 120/70 | HR 80 | Ht 62.0 in | Wt 158.0 lb

## 2011-11-05 DIAGNOSIS — R5381 Other malaise: Secondary | ICD-10-CM | POA: Diagnosis not present

## 2011-11-05 DIAGNOSIS — R000 Tachycardia, unspecified: Secondary | ICD-10-CM | POA: Diagnosis not present

## 2011-11-05 DIAGNOSIS — E7800 Pure hypercholesterolemia, unspecified: Secondary | ICD-10-CM

## 2011-11-05 DIAGNOSIS — Z13220 Encounter for screening for lipoid disorders: Secondary | ICD-10-CM

## 2011-11-05 DIAGNOSIS — R5383 Other fatigue: Secondary | ICD-10-CM

## 2011-11-05 NOTE — Progress Notes (Signed)
Sarah [name] Date of Birth:  [birth year] [facility] [address] [place], [place]  [address] [phone]         Fax   [phone]  History of Present Illness: This pleasant 78 year old widowed Caucasian female is seen for a four-month followup office visit.  He has a past history of atypical chest pain.  She does not have any history of ischemic heart disease.  She had a normal nuclear stress test in April 2012.  She has a normal left ventricular function with ejection fraction of 88%.  She does have a history of hypercholesterolemia but is intolerant of statins because of leg weakness and memory loss.  He has a bad problem with arthritis and is followed closely by Dr. Kellie Simmering.  She is on boniva for osteoporosis  Current Outpatient Prescriptions  Medication Sig Dispense Refill  . Acetaminophen (TYLENOL ARTHRITIS PAIN PO) Take by mouth 2 (two) times daily.        Marland Kitchen ALPRAZolam (XANAX) 1 MG tablet Take 1 tablet (1 mg total) by mouth 2 (two) times daily.  60 tablet  0  . Calcium Carbonate (CALCIUM 500 PO) Take by mouth daily.        . Cholecalciferol (VITAMIN D PO) Take by mouth. Taking 2000 daily       . estradiol (ESTRACE VAGINAL) 0.1 MG/GM vaginal cream Place 2 g vaginally 2 (two) times a week. As needed  42.5 g  12  . furosemide (LASIX) 20 MG tablet May take two tablets for one week and then go back to one each day.  30 tablet  6  . HALCION 0.25 MG tablet TAKE 1 TABLET AT BEDTIME AS NEEDED.  30 each  5  . HYDROcodone-acetaminophen (VICODIN) 5-500 MG per tablet Take 1 tablet by mouth daily as needed.      . Multiple Vitamins-Minerals (CENTRUM PO) Take by mouth.      . nitroGLYCERIN (NITROSTAT) 0.4 MG SL tablet Place 1 tablet (0.4 mg total) under the tongue every 5 (five) minutes as needed for chest pain.  25 tablet  PRN  . NON FORMULARY Emergen-C use 1 packet daily      . nortriptyline (PAMELOR) 25 MG capsule Take 1 capsule (25 mg total) by mouth at bedtime.  30  capsule  5  . perphenazine (TRILAFON) 2 MG tablet TAKE ONE TABLET AT BEDTIME.  30 tablet  5  . polyethylene glycol (MIRALAX / GLYCOLAX) packet Take 17 g by mouth daily. As needed       . solifenacin (VESICARE) 10 MG tablet       . DISCONTD: VESICARE 10 MG tablet TAKE 1 TABLET ONCE DAILY.  30 each  11  . clotrimazole-betamethasone (LOTRISONE) cream as directed.      . fluconazole (DIFLUCAN) 100 MG tablet as directed.      Marland Kitchen ketoconazole (NIZORAL) 2 % shampoo as directed.        Allergies  Allergen Reactions  . Celebrex (Celecoxib)     rash  . Latex   . Lipitor (Atorvastatin Calcium)     Leg weakness,memory loss  . Lovastatin     Leg weakness  . Other     Always pads    Patient Active Problem List  Diagnosis  . Osteoarthritis  . Chest pain  . Edema  . Tachycardia  . Osteoporosis, postmenopausal  . Anxiety  . Mixed incontinence urge and stress  . Malaise and fatigue    History  Smoking status  .  Never Smoker   Smokeless tobacco  . Not on file    History  Alcohol Use No    Family History  Problem Relation Age of Onset  . Cancer Son     testicular cancer    Review of Systems: Constitutional: no fever chills diaphoresis or fatigue or change in weight.  Head and neck: no hearing loss, no epistaxis, no photophobia or visual disturbance. Respiratory: No cough, shortness of breath or wheezing. Cardiovascular: No chest pain peripheral edema, palpitations. Gastrointestinal: No abdominal distention, no abdominal pain, no change in bowel habits hematochezia or melena. Genitourinary: No dysuria, no frequency, no urgency, no nocturia. Musculoskeletal:No arthralgias, no back pain, no gait disturbance or myalgias. Neurological: No dizziness, no headaches, no numbness, no seizures, no syncope, no weakness, no tremors. Hematologic: No lymphadenopathy, no easy bruising. Psychiatric: No confusion, no hallucinations, no sleep disturbance.    Physical Exam: Filed Vitals:    11/05/11 1342  BP: 120/70  Pulse: 80   the general appearance reveals a well-developed well-nourished woman in no distress.The head and neck exam reveals pupils equal and reactive.  Extraocular movements are full.  There is no scleral icterus.  The mouth and pharynx are normal.  The neck is supple.  The carotids reveal no bruits.  The jugular venous pressure is normal.  The  thyroid is not enlarged.  There is no lymphadenopathy.  The chest is clear to percussion and auscultation.  There are no rales or rhonchi.  Expansion of the chest is symmetrical.  The precordium is quiet.  The first heart sound is normal.  The second heart sound is physiologically split.  There is no murmur gallop rub or click.  There is no abnormal lift or heave.  The abdomen is soft and nontender.  The bowel sounds are normal.  The liver and spleen are not enlarged.  There are no abdominal masses.  There are no abdominal bruits.  Extremities reveal good pedal pulses.  There is no phlebitis or edema.  There is no cyanosis or clubbing.  Strength is normal and symmetrical in all extremities.  There is no lateralizing weakness.  There are no sensory deficits.  The skin is warm and dry.  There is no rash.     Assessment / Plan: The patient brought in copies of her advanced directives. Patient will continue on her same medication and she'll at over-the-counter Centrum multivitamin to help her get more energy.  Recheck in 4 months for followup office visit EKG CBC TSH fasting lipid panel hepatic function panel and basal metabolic panel

## 2011-11-05 NOTE — Assessment & Plan Note (Signed)
The patient has occasional brief chest pain which do not sound ischemic.  Not been getting much regular exercise and not using her treadmill because of chronic back pain.  She complains of no energy

## 2011-11-05 NOTE — Assessment & Plan Note (Signed)
The patient is not on any medication for her cholesterol.  We will plan to check her fasting lipid panel at her next visit.  She does try eating sensibly in a heart healthy manner.  She does enjoy filet mignon and a martini every Saturday night.

## 2011-11-05 NOTE — Patient Instructions (Addendum)
Add Centrum daily, otherwise continue other medications   Your physician recommends that you schedule a follow-up appointment in: 4 months with fasting labs (lp/bmet/hfp/cbc/tsh) and EKG

## 2011-11-05 NOTE — Assessment & Plan Note (Signed)
The patient has not been experiencing any recent palpitations or tachycardia

## 2011-11-11 ENCOUNTER — Other Ambulatory Visit: Payer: Self-pay | Admitting: *Deleted

## 2011-11-11 DIAGNOSIS — G4700 Insomnia, unspecified: Secondary | ICD-10-CM

## 2011-11-11 DIAGNOSIS — F419 Anxiety disorder, unspecified: Secondary | ICD-10-CM

## 2011-11-11 MED ORDER — TRIAZOLAM 0.25 MG PO TABS: 0.2500 mg | ORAL_TABLET | Freq: Every evening | ORAL | Status: DC | PRN

## 2011-11-11 MED ORDER — ALPRAZOLAM 1 MG PO TABS: 1.0000 mg | ORAL_TABLET | Freq: Two times a day (BID) | ORAL | Status: DC

## 2011-11-11 NOTE — Telephone Encounter (Signed)
Refilled halcion and alprazolam x 3

## 2011-11-15 ENCOUNTER — Other Ambulatory Visit: Payer: Self-pay | Admitting: Cardiology

## 2011-11-17 NOTE — Telephone Encounter (Signed)
Refilled trilafon

## 2011-12-01 ENCOUNTER — Ambulatory Visit (INDEPENDENT_AMBULATORY_CARE_PROVIDER_SITE_OTHER): Payer: Medicare Other | Admitting: Internal Medicine

## 2011-12-01 ENCOUNTER — Encounter: Payer: Self-pay | Admitting: Internal Medicine

## 2011-12-01 VITALS — BP 118/62 | HR 96 | Temp 98.1°F | Ht 63.0 in | Wt 159.4 lb

## 2011-12-01 DIAGNOSIS — H6120 Impacted cerumen, unspecified ear: Secondary | ICD-10-CM | POA: Diagnosis not present

## 2011-12-01 DIAGNOSIS — R609 Edema, unspecified: Secondary | ICD-10-CM | POA: Diagnosis not present

## 2011-12-01 DIAGNOSIS — R5381 Other malaise: Secondary | ICD-10-CM

## 2011-12-01 DIAGNOSIS — R5383 Other fatigue: Secondary | ICD-10-CM | POA: Diagnosis not present

## 2011-12-01 DIAGNOSIS — F419 Anxiety disorder, unspecified: Secondary | ICD-10-CM

## 2011-12-01 DIAGNOSIS — F411 Generalized anxiety disorder: Secondary | ICD-10-CM | POA: Diagnosis not present

## 2011-12-01 DIAGNOSIS — H6122 Impacted cerumen, left ear: Secondary | ICD-10-CM

## 2011-12-01 MED ORDER — CYANOCOBALAMIN 1000 MCG/ML IJ SOLN
1000.0000 ug | Freq: Once | INTRAMUSCULAR | Status: AC
  Administered 2011-12-01: 1000 ug via INTRAMUSCULAR

## 2011-12-01 NOTE — Assessment & Plan Note (Signed)
Continue daily Xanax as has been on same chronically for years. Also Pamelor and Halicon for sleep, no change recommended  

## 2011-12-01 NOTE — Assessment & Plan Note (Signed)
Chronic, dependant Precipitated by travel - intolerant of compression hose Continue lifestyle modification, elevation and sodium restriction + diuretics as rx'd

## 2011-12-01 NOTE — Progress Notes (Signed)
Subjective:    Patient ID: Sarah [name], female    DOB: [birth year], 78 y.o.   MRN: [id number]  HPI  Here for follow up - review chronic medical issues today:  Osteoporosis. Previously followed with rheumatology for same. Denies history fracture. No back pain or new bone pain.Marland Kitchen took weekly generic Fosamax for years, changed to [facility] in approximately 2010. Compliant with medications monthly and daily calcium plus vitamin D. Active weightbearing exercise as tolerated by arthritis, see next  Osteoarthritis. Diffuse including knees, low back and hands. Takes over-the-counter NSAIDs such as ibuprofen or Aleve on a scheduled basis daily. Occasional hydrocodone as needed for severe pain symptoms. Has worked with outpatient physical therapy. Denies any acute joint swelling or recent injury  Bilateral feet edema. Ongoing since November 2012 chronically. Previously experienced edema only episodic related to beach travel and excessive salt consumption. In the past 3 months, chronic swelling of lower extremity and ankles/feet. Has been evaluated by cardiology for same. Slightly improved but not resolved with daily Lasix. Noncompliant with compression hose due to discomfort of fit.  Anxiety. Takes Xanax twice daily scheduled. Also associated with chronic insomnia for which she takes [employer]  Past Medical History  Diagnosis Date  . Osteoarthritis   . Bladder incontinence   . Constipation     hx of  . Hypertension   . Hyperlipidemia   . Chest pain     S/P nuclear 07/17/2008  normal; EF 88% with no ischemia  . Chronic venous insufficiency     BLE edema, chronic, neg venous US 2/13  . History of chicken pox   . Depression   . Diverticulitis   . Polyp of colon   . Osteoporosis, postmenopausal 06/02/2011    Review of Systems  Constitutional: Negative for fever or weight change. complains of fatigue Respiratory: Negative for cough and shortness of breath.   Cardiovascular: Negative for  chest pain or palpitations.       Objective:   Physical Exam  BP 118/62  Pulse 96  Temp 98.1 F (36.7 C) (Oral)  Ht 5\' 3"  (1.6 m)  Wt 159 lb 6.6 oz (72.308 kg)  BMI 28.24 kg/m2  SpO2 96% Wt Readings from Last 3 Encounters:  12/01/11 159 lb 6.6 oz (72.308 kg)  11/05/11 158 lb (71.668 kg)  06/09/11 161 lb (73.029 kg)   Constitutional: She is elderly but spry and fit, appears well-developed and well-nourished. No distress.  HENT: Cerumen impaction L side - after irrigation, B TMs clear without effusion or erythema Neck: Normal range of motion. Neck supple. No JVD present. No thyromegaly present.  Cardiovascular: Normal rate, regular rhythm and normal heart sounds.  No murmur heard. 1+ nonpitting BLE edema feet to below knee. Pulmonary/Chest: Effort normal and breath sounds normal. No respiratory distress. She has no wheezes.  Musculoskeletal: OA change B hands and knees -but normal range of motion, no joint effusions. No gross deformities Neurological: She is alert and oriented to person, place, and time. No cranial nerve deficit. Coordination normal.  Psychiatric: She has a normal mood and affect. Her behavior is normal. Judgment and thought content normal.   Lab Results  Component Value Date   WBC 7.5 06/09/2011   HGB 11.9* 06/09/2011   HCT 36.2 06/09/2011   PLT 288.0 06/09/2011   GLUCOSE 102* 06/09/2011   CHOL 166 06/09/2011   TRIG 116.0 06/09/2011   HDL 69.70 06/09/2011   LDLDIRECT 101.1 10/21/2010   LDLCALC 73 06/09/2011   ALT 20  06/09/2011   AST 14 06/09/2011   NA 134* 06/09/2011   K 4.7 06/09/2011   CL 100 06/09/2011   CREATININE 0.9 06/09/2011   BUN 14 06/09/2011   CO2 27 06/09/2011   No results found for this basename: VITAMINB12   Procedure: wax removal Reason: wax impaction Risks and benefits of procedure discussed with the patient who agrees to proceed. Ear(s) irrigated with warm water. Large amount of wax removed. Instrumentation with metal ear loop was performed to  accomplish wax removal. the patient tolerated procedure well.     Assessment & Plan:   L cerumen impaction - irrigation as above, resolved  Also see problem list. Medications and labs reviewed today.

## 2011-12-01 NOTE — Assessment & Plan Note (Signed)
Nonspecific hx/exam - recent labs reviewed Pt wants B12 shot "even if i have to pay for it" as she has responded well to same in past - done in OV today

## 2011-12-01 NOTE — Patient Instructions (Signed)
It was good to see you today. We have reviewed your prior records including labs and tests today Your ears have been irrigated of wax today -let us know if continued hearing problems persist for referral to audiologist and hearing testing B12 shot given today Medications reviewed and updated, no changes at this time. Please schedule followup in 6 months, call sooner if problems.

## 2011-12-10 DIAGNOSIS — Z23 Encounter for immunization: Secondary | ICD-10-CM | POA: Diagnosis not present

## 2011-12-13 ENCOUNTER — Other Ambulatory Visit: Payer: Self-pay | Admitting: *Deleted

## 2011-12-13 NOTE — Telephone Encounter (Signed)
Pt Req rx for b12 inj and pneumonia vaccine to be sent to [facility].

## 2011-12-13 NOTE — Telephone Encounter (Signed)
Ok to call in prescriptions as requested - or you may generate rx for me to sign if needed - thanks

## 2011-12-15 MED ORDER — PNEUMOCOCCAL VAC POLYVALENT 25 MCG/0.5ML IJ INJ: 0.5000 mL | INJECTION | Freq: Once | INTRAMUSCULAR | Status: DC

## 2011-12-15 MED ORDER — CYANOCOBALAMIN 1000 MCG/ML IJ SOLN: 1000.0000 ug | INTRAMUSCULAR | Status: DC

## 2011-12-29 ENCOUNTER — Ambulatory Visit (INDEPENDENT_AMBULATORY_CARE_PROVIDER_SITE_OTHER): Payer: Medicare Other

## 2011-12-29 ENCOUNTER — Ambulatory Visit: Payer: Medicare Other

## 2011-12-29 DIAGNOSIS — R5383 Other fatigue: Secondary | ICD-10-CM | POA: Diagnosis not present

## 2011-12-29 DIAGNOSIS — R5381 Other malaise: Secondary | ICD-10-CM

## 2011-12-29 DIAGNOSIS — Z23 Encounter for immunization: Secondary | ICD-10-CM

## 2011-12-29 MED ORDER — CYANOCOBALAMIN 1000 MCG/ML IJ SOLN
1000.0000 ug | Freq: Once | INTRAMUSCULAR | Status: AC
  Administered 2011-12-29: 1000 ug via INTRAMUSCULAR

## 2011-12-29 MED ORDER — PNEUMOCOCCAL VAC POLYVALENT 25 MCG/0.5ML IJ INJ: 0.5000 mL | INJECTION | Freq: Once | INTRAMUSCULAR | Status: DC

## 2012-01-25 ENCOUNTER — Ambulatory Visit (INDEPENDENT_AMBULATORY_CARE_PROVIDER_SITE_OTHER): Payer: Medicare Other

## 2012-01-25 DIAGNOSIS — R5383 Other fatigue: Secondary | ICD-10-CM | POA: Diagnosis not present

## 2012-01-25 DIAGNOSIS — L821 Other seborrheic keratosis: Secondary | ICD-10-CM | POA: Diagnosis not present

## 2012-01-25 DIAGNOSIS — R5381 Other malaise: Secondary | ICD-10-CM | POA: Diagnosis not present

## 2012-01-25 MED ORDER — CYANOCOBALAMIN 1000 MCG/ML IJ SOLN
1000.0000 ug | Freq: Once | INTRAMUSCULAR | Status: AC
  Administered 2012-01-25: 1000 ug via INTRAMUSCULAR

## 2012-02-02 DIAGNOSIS — N94810 Vulvar vestibulitis: Secondary | ICD-10-CM | POA: Diagnosis not present

## 2012-02-29 ENCOUNTER — Ambulatory Visit: Payer: Medicare Other

## 2012-03-01 ENCOUNTER — Ambulatory Visit: Payer: Medicare Other

## 2012-03-02 ENCOUNTER — Ambulatory Visit (INDEPENDENT_AMBULATORY_CARE_PROVIDER_SITE_OTHER): Payer: Medicare Other | Admitting: *Deleted

## 2012-03-02 DIAGNOSIS — E538 Deficiency of other specified B group vitamins: Secondary | ICD-10-CM

## 2012-03-02 MED ORDER — CYANOCOBALAMIN 1000 MCG/ML IJ SOLN: 1000.0000 ug | Freq: Once | INTRAMUSCULAR | Status: DC

## 2012-03-07 ENCOUNTER — Other Ambulatory Visit: Payer: Medicare Other

## 2012-03-07 ENCOUNTER — Ambulatory Visit: Payer: Medicare Other | Admitting: Cardiology

## 2012-03-15 ENCOUNTER — Telehealth: Payer: Self-pay | Admitting: Cardiology

## 2012-03-15 ENCOUNTER — Other Ambulatory Visit: Payer: Self-pay

## 2012-03-15 DIAGNOSIS — F419 Anxiety disorder, unspecified: Secondary | ICD-10-CM

## 2012-03-15 MED ORDER — ALPRAZOLAM 1 MG PO TABS: 1.0000 mg | ORAL_TABLET | Freq: Two times a day (BID) | ORAL | Status: DC

## 2012-03-15 NOTE — Telephone Encounter (Signed)
New Problem:    Patient called in because her pharmacy is claiming that they still have not received her request for ALPRAZolam Prudy Feeler) 1 MG tablet.

## 2012-03-16 ENCOUNTER — Other Ambulatory Visit: Payer: Self-pay | Admitting: *Deleted

## 2012-03-16 NOTE — Telephone Encounter (Signed)
Spoke with pharmacy

## 2012-03-21 DIAGNOSIS — F411 Generalized anxiety disorder: Secondary | ICD-10-CM | POA: Diagnosis not present

## 2012-03-21 DIAGNOSIS — IMO0002 Reserved for concepts with insufficient information to code with codable children: Secondary | ICD-10-CM | POA: Diagnosis not present

## 2012-03-21 DIAGNOSIS — R198 Other specified symptoms and signs involving the digestive system and abdomen: Secondary | ICD-10-CM | POA: Diagnosis not present

## 2012-03-21 DIAGNOSIS — T18108A Unspecified foreign body in esophagus causing other injury, initial encounter: Secondary | ICD-10-CM | POA: Diagnosis not present

## 2012-03-21 DIAGNOSIS — I10 Essential (primary) hypertension: Secondary | ICD-10-CM | POA: Diagnosis not present

## 2012-03-21 DIAGNOSIS — R05 Cough: Secondary | ICD-10-CM | POA: Diagnosis not present

## 2012-03-21 DIAGNOSIS — K222 Esophageal obstruction: Secondary | ICD-10-CM | POA: Diagnosis not present

## 2012-03-23 ENCOUNTER — Other Ambulatory Visit: Payer: Self-pay

## 2012-03-23 ENCOUNTER — Telehealth: Payer: Self-pay | Admitting: Internal Medicine

## 2012-03-23 NOTE — Telephone Encounter (Signed)
lmom for pt's son to call back.

## 2012-03-23 NOTE — Telephone Encounter (Signed)
Pt with hx of reflux peptic stricture, chronic stable constipation, iron deficiency anemia and adenomatous polyps. Pt's alst ECL was 11/29/2005 after an OV. She has been dilated in 01/2002, 06/29/2002, 10/09/2002 and the last was 11/29/2005. Pt's son reports pt has a "spell" of food lodging in her throat at Thanksgiving, but was able to dislodge it with water. On 03/20/12, she was eating steak and lobster at a [employer] grill and she could not pass it. She was taken to [facility] ER and the food was removed, but the md suggested she f/u here d/t swelling and inflammation of the tissue. Please advise; OV or EGD? Thanks.

## 2012-03-23 NOTE — Telephone Encounter (Signed)
It has been quite a while since I have seen this patient... I recommend that she start on Prilosec OTC 20 mg daily, chew her food well and avoid meats for now, and set up a routine office appointment with me. Also, get her old chart and have it placed in my in box. Thank you

## 2012-03-23 NOTE — Telephone Encounter (Signed)
Informed son of appt and eating precautions with restrictions; son stated understanding.

## 2012-03-27 ENCOUNTER — Other Ambulatory Visit: Payer: Self-pay | Admitting: *Deleted

## 2012-03-27 DIAGNOSIS — F329 Major depressive disorder, single episode, unspecified: Secondary | ICD-10-CM

## 2012-03-27 MED ORDER — NORTRIPTYLINE HCL 25 MG PO CAPS: 25.0000 mg | ORAL_CAPSULE | Freq: Every day | ORAL | Status: DC

## 2012-03-27 NOTE — Telephone Encounter (Signed)
Kim at [facility] [phone] want to know if pt can have a refill on nortriptyline.

## 2012-03-30 ENCOUNTER — Telehealth: Payer: Self-pay | Admitting: Internal Medicine

## 2012-03-31 NOTE — Telephone Encounter (Signed)
Pt was confused about her appt; she thought she was having an EGD that day instead of an OV. Explained that it was too long in between visits and at her age, she needs to be assessed prior to dilation. Informed her we are looking daily for a cancellation, but if she gets worse, call us; pt stated understanding.

## 2012-03-31 NOTE — Telephone Encounter (Signed)
Phoned pt to talk about her problem and she stated she's asleep and please call back.

## 2012-04-07 ENCOUNTER — Other Ambulatory Visit: Payer: Medicare Other

## 2012-04-07 ENCOUNTER — Ambulatory Visit: Payer: Medicare Other | Admitting: Cardiology

## 2012-04-13 ENCOUNTER — Ambulatory Visit (INDEPENDENT_AMBULATORY_CARE_PROVIDER_SITE_OTHER): Payer: Medicare Other | Admitting: *Deleted

## 2012-04-13 DIAGNOSIS — E538 Deficiency of other specified B group vitamins: Secondary | ICD-10-CM | POA: Diagnosis not present

## 2012-04-13 MED ORDER — CYANOCOBALAMIN 1000 MCG/ML IJ SOLN
1000.0000 ug | Freq: Once | INTRAMUSCULAR | Status: AC
  Administered 2012-04-13: 1000 ug via INTRAMUSCULAR

## 2012-04-14 ENCOUNTER — Encounter: Payer: Self-pay | Admitting: Internal Medicine

## 2012-04-14 ENCOUNTER — Ambulatory Visit (INDEPENDENT_AMBULATORY_CARE_PROVIDER_SITE_OTHER): Payer: Medicare Other | Admitting: Internal Medicine

## 2012-04-14 VITALS — BP 124/62 | HR 68 | Ht 63.0 in | Wt 156.0 lb

## 2012-04-14 DIAGNOSIS — R1310 Dysphagia, unspecified: Secondary | ICD-10-CM | POA: Diagnosis not present

## 2012-04-14 DIAGNOSIS — K219 Gastro-esophageal reflux disease without esophagitis: Secondary | ICD-10-CM

## 2012-04-14 DIAGNOSIS — K222 Esophageal obstruction: Secondary | ICD-10-CM | POA: Diagnosis not present

## 2012-04-14 NOTE — Patient Instructions (Addendum)
You have been scheduled for an endoscopy with dilation with propofol. Please follow written instructions given to you at your visit today. If you use inhalers (even only as needed) or a CPAP machine, please bring them with you on the day of your procedure.

## 2012-04-14 NOTE — Progress Notes (Signed)
HISTORY OF PRESENT ILLNESS:  Sarah [name] is a 79 y.o. female with the below listed medical history who has been seen previously in this office for adenomatous colon polyps, GERD complicated by peptic stricture that required esophageal dilation, and iron deficiency anemia. She presents today with recurrent dysphagia and recent food impaction requiring endoscopic removal. She was last evaluated in 2007 for deficiency anemia. Upper endoscopy at that time revealed a peptic stricture (asymptomatic at the time) as well as NSAID-induced gastritis (felt to be the cause for iron deficiency. Complete colonoscopy at that time was remarkable for diminutive colon polyp as well as marked diverticulosis with sigmoid stenosis. Her last esophageal dilation, to 16 mm, was performed in 2004. The patient's current history is that of consuming meat and lobster a few days prior to Christmas. She developed an acute food impaction which required endoscopic removal at [facility]. No dilation performed due to macerated mucosa and edema. She was placed on PPI. Despite the advised to stay on chronic PPI therapy, she has been on PPIs for years. She is now on pantoprazole 40 mg daily. Her GI review of systems is otherwise negative.  REVIEW OF SYSTEMS:  All non-GI ROS negative except for arthritis, back pain, visual change, fatigue, feet swelling  Past Medical History  Diagnosis Date  . Osteoarthritis   . Bladder incontinence   . Constipation     hx of  . Hypertension   . Hyperlipidemia   . Chest pain     S/P nuclear 07/17/2008  normal; EF 88% with no ischemia  . Chronic venous insufficiency     BLE edema, chronic, neg venous US 2/13  . History of chicken pox   . Depression   . Diverticulitis   . Polyp of colon   . Osteoporosis, postmenopausal 06/02/2011  . Esophageal stricture   . GERD (gastroesophageal reflux disease)     Past Surgical History  Procedure Date  . Appendectomy     age 75  . Tubal ligation     Social  History Sarah [name]  reports that she has never smoked. She has never used smokeless tobacco. She reports that she does not drink alcohol or use illicit drugs.  family history includes Cancer in her son.  There is no history of Colon cancer.  Allergies  Allergen Reactions  . Celebrex (Celecoxib)     rash  . Latex   . Lipitor (Atorvastatin Calcium)     Leg weakness,memory loss  . Lovastatin     Leg weakness  . Other     Always pads       PHYSICAL EXAMINATION: Vital signs: BP 124/62  Pulse 68  Ht 5\' 3"  (1.6 m)  Wt 156 lb (70.761 kg)  BMI 27.63 kg/m2  Constitutional: generally well-appearing, no acute distress Psychiatric: alert and oriented x3, cooperative Eyes: extraocular movements intact, anicteric, conjunctiva pink Mouth: oral pharynx moist, no lesions Neck: supple no lymphadenopathy Cardiovascular: heart regular rate and rhythm, no murmur Lungs: clear to auscultation bilaterally Abdomen: soft, nontender, nondistended, no obvious ascites, no peritoneal signs, normal bowel sounds, no organomegaly Rectal: Omitted Extremities: no lower extremity edema bilaterally Skin: no lesions on visible extremities Neuro: No focal deficits.   ASSESSMENT:  #1. GERD complicated by peptic stricture. Recent food impaction   PLAN:  #1. Continue pantoprazole 40 mg daily #2. Upper endoscopy with esophageal dilation.The nature of the procedure, as well as the risks, benefits, and alternatives were carefully and thoroughly reviewed with the patient. Ample time  for discussion and questions allowed. The patient understood, was satisfied, and agreed to proceed.  #3. Reflux precautions and chew food carefully

## 2012-04-17 ENCOUNTER — Other Ambulatory Visit: Payer: Self-pay | Admitting: Cardiology

## 2012-04-17 ENCOUNTER — Other Ambulatory Visit: Payer: Self-pay

## 2012-04-17 DIAGNOSIS — F419 Anxiety disorder, unspecified: Secondary | ICD-10-CM

## 2012-04-17 MED ORDER — ALPRAZOLAM 1 MG PO TABS: 1.0000 mg | ORAL_TABLET | Freq: Two times a day (BID) | ORAL | Status: DC

## 2012-04-18 MED ORDER — ALPRAZOLAM 1 MG PO TABS: 1.0000 mg | ORAL_TABLET | Freq: Two times a day (BID) | ORAL | Status: DC

## 2012-04-19 ENCOUNTER — Telehealth: Payer: Self-pay | Admitting: Internal Medicine

## 2012-04-19 NOTE — Telephone Encounter (Signed)
Pt states she is having increased headaches since starting the soft food diet. Pt is having and EGD with dil in early Feb for difficulty swallowing. Pt thinks the headaches are related to you not taking in enough calories. Talked with pt and encouraged her to take in more protein that would help keep her blood sugar up better. Encouraged her to try [place] yogart which has more protein and perhaps add icecream to the ensure that adds additional calories. Pt verbalized understanding.

## 2012-04-21 ENCOUNTER — Encounter: Payer: Self-pay | Admitting: Internal Medicine

## 2012-05-05 ENCOUNTER — Ambulatory Visit: Payer: Medicare Other | Admitting: Cardiology

## 2012-05-05 ENCOUNTER — Ambulatory Visit (AMBULATORY_SURGERY_CENTER): Payer: Medicare Other | Admitting: Internal Medicine

## 2012-05-05 ENCOUNTER — Encounter: Payer: Self-pay | Admitting: Internal Medicine

## 2012-05-05 ENCOUNTER — Other Ambulatory Visit: Payer: Medicare Other

## 2012-05-05 VITALS — BP 139/74 | HR 80 | Temp 96.7°F | Resp 16 | Ht 63.0 in | Wt 156.0 lb

## 2012-05-05 DIAGNOSIS — K219 Gastro-esophageal reflux disease without esophagitis: Secondary | ICD-10-CM | POA: Diagnosis not present

## 2012-05-05 DIAGNOSIS — I10 Essential (primary) hypertension: Secondary | ICD-10-CM | POA: Diagnosis not present

## 2012-05-05 DIAGNOSIS — K222 Esophageal obstruction: Secondary | ICD-10-CM | POA: Diagnosis not present

## 2012-05-05 DIAGNOSIS — R1310 Dysphagia, unspecified: Secondary | ICD-10-CM | POA: Diagnosis not present

## 2012-05-05 DIAGNOSIS — I999 Unspecified disorder of circulatory system: Secondary | ICD-10-CM | POA: Diagnosis not present

## 2012-05-05 MED ORDER — SODIUM CHLORIDE 0.9 % IV SOLN: 500.0000 mL | INTRAVENOUS | Status: DC

## 2012-05-05 NOTE — Progress Notes (Signed)
Patient states she feels bloated, abdomen soft to palpation. Encouraged patient to drink warm clear liquids on arrival home. Patient can use GasXif bloating continues. Recovery telephone # given if patient has questions or concerns once home. Emergency numbers on after visit summary. Patient denies pain level greater than 4,"Minimal discomfort."If I could just fart."

## 2012-05-05 NOTE — Progress Notes (Signed)
Patient did not experience any of the following events: a burn prior to discharge; a fall within the facility; wrong site/side/patient/procedure/implant event; or a hospital transfer or hospital admission upon discharge from the facility. (G8907) Patient did not have preoperative order for IV antibiotic SSI prophylaxis. (G8918)  

## 2012-05-05 NOTE — Op Note (Signed)
[facility] [address] [place], [address]   ENDOSCOPY PROCEDURE REPORT  PATIENT: Sarah [name], Sarah [name]  MR#: [id number] BIRTHDATE: 16-Mar-1927 , 85  yrs. old GENDER: Female ENDOSCOPIST: Roxy Cedar, MD REFERRED BY:  .  Self / Office PROCEDURE DATE:  05/05/2012 PROCEDURE:  Balloon dilation of esophagus  -15 mm ASA CLASS:     Class II INDICATIONS:  Dysphagia. MEDICATIONS: MAC sedation, administered by CRNA and propofol (Diprivan) 140mg  IV TOPICAL ANESTHETIC: Cetacaine Spray  DESCRIPTION OF PROCEDURE: After the risks benefits and alternatives of the procedure were thoroughly explained, informed consent was obtained.  The LB GIF-H180 [id number] endoscope was introduced through the mouth and advanced to the second portion of the duodenum. Without limitations.  The instrument was slowly withdrawn as the mucosa was fully examined.      The proximal and midesophagus appeared normal.  The distal esophagus revealed a 14 mm ringlike stricture at the gastroesophageal junction (30 cm from the incisors) as well as a ringlike stricture measuring 12 mm and located approximately 2 cm proximal to the other stricture.  The gastric mucosa was diffusely atrophic. Retroflexed view was unremarkable.  The duodenum was normal.  THERAPY: A 15 mm TTS balloon was insufflated twice, across both strictures.  Disruption of the proximal ring was observed. Positive heme.  Tolerated well.         The scope was then withdrawn from the patient and the procedure completed.  COMPLICATIONS: There were no complications. ENDOSCOPIC IMPRESSION: 1.Tandem distal esophageal stricture status post balloon dilation. 2. Atrophic gastritis  RECOMMENDATIONS: 1.  Clear liquids until 5 PM, then soft foods rest of day.  Resume prior diet tomorrow. 2.  Continue current meds 3. For any additional swallowing difficulties, please contact Dr. Marina Goodell, as you may require repeat dilation with a larger  diameter dilator  REPEAT EXAM:  eSigned:  Roxy Cedar, MD 05/05/2012 2:43 PM  GN:[id number] Mervyn Skeeters Felicity Coyer, MD and The Patient

## 2012-05-05 NOTE — Patient Instructions (Addendum)
YOU HAD AN ENDOSCOPIC PROCEDURE TODAY AT THE [facility]: Refer to the procedure report that was given to you for any specific questions about what was found during the examination.  If the procedure report does not answer your questions, please call your gastroenterologist to clarify.  If you requested that your care partner not be given the details of your procedure findings, then the procedure report has been included in a sealed envelope for you to review at your convenience later.  YOU SHOULD EXPECT: Some feelings of bloating in the abdomen. Passage of more gas than usual.  Walking can help get rid of the air that was put into your GI tract during the procedure and reduce the bloating. If you had a lower endoscopy (such as a colonoscopy or flexible sigmoidoscopy) you may notice spotting of blood in your stool or on the toilet paper. If you underwent a bowel prep for your procedure, then you may not have a normal bowel movement for a few days.  DIET:  Nothing to eat or drink until 3:30. 3:30 until 5:00 only CLEAR LIQUIDS. 5:00 until morning only SOFT FOODS. Resume your regular diet in am.   Drink plenty of fluids but you should avoid alcoholic beverages for 24 hours.  ACTIVITY: Your care partner should take you home directly after the procedure.  You should plan to take it easy, moving slowly for the rest of the day.  You can resume normal activity the day after the procedure however you should NOT DRIVE or use heavy machinery for 24 hours (because of the sedation medicines used during the test).    SYMPTOMS TO REPORT IMMEDIATELY: A gastroenterologist can be reached at any hour.  During normal business hours, 8:30 AM to 5:00 PM Monday through Friday, call [phone].  After hours and on weekends, please call the GI answering service at [phone] who will take a message and have the physician on call contact you.   Following upper endoscopy (EGD)  Vomiting of blood or  coffee ground material  New chest pain or pain under the shoulder blades  Painful or persistently difficult swallowing  New shortness of breath  Fever of 100F or higher  Black, tarry-looking stools  FOLLOW UP: If any biopsies were taken you will be contacted by phone or by letter within the next 1-3 weeks.  Call your gastroenterologist if you have not heard about the biopsies in 3 weeks.  Our staff will call the home number listed on your records the next business day following your procedure to check on you and address any questions or concerns that you may have at that time regarding the information given to you following your procedure. This is a courtesy call and so if there is no answer at the home number and we have not heard from you through the emergency physician on call, we will assume that you have returned to your regular daily activities without incident.  SIGNATURES/CONFIDENTIALITY: You and/or your care partner have signed paperwork which will be entered into your electronic medical record.  These signatures attest to the fact that that the information above on your After Visit Summary has been reviewed and is understood.  Full responsibility of the confidentiality of this discharge information lies with you and/or your care-partner.

## 2012-05-05 NOTE — Progress Notes (Signed)
No egg or soy allergy. ewm 

## 2012-05-08 ENCOUNTER — Telehealth: Payer: Self-pay | Admitting: *Deleted

## 2012-05-08 NOTE — Telephone Encounter (Signed)
  Follow up Call-  Call back number 05/05/2012  Post procedure Call Back phone  # [phone]  Permission to leave phone message Yes     Patient questions:  Do you have a fever, pain , or abdominal swelling? no Pain Score  0 *  Have you tolerated food without any problems? yes  Have you been able to return to your normal activities? yes  Do you have any questions about your discharge instructions: Diet   no Medications  no Follow up visit  no  Do you have questions or concerns about your Care? no  Actions: * If pain score is 4 or above: No action needed, pain <4.

## 2012-05-09 ENCOUNTER — Telehealth: Payer: Self-pay | Admitting: Internal Medicine

## 2012-05-09 MED ORDER — PANTOPRAZOLE SODIUM 40 MG PO TBEC: 40.0000 mg | DELAYED_RELEASE_TABLET | Freq: Every day | ORAL | Status: DC

## 2012-05-09 MED ORDER — PANTOPRAZOLE SODIUM 40 MG PO TBEC: 40.0000 mg | DELAYED_RELEASE_TABLET | Freq: Two times a day (BID) | ORAL | Status: DC

## 2012-05-09 NOTE — Addendum Note (Signed)
Addended by: Selinda Michaels R on: 05/09/2012 01:39 PM   Modules accepted: Orders

## 2012-05-09 NOTE — Telephone Encounter (Signed)
I think it is unlikely that her headache is related to the anesthesia this point. She should be evaluated by her PCP for this ASAP. Okay to refill pantoprazole

## 2012-05-09 NOTE — Telephone Encounter (Signed)
Spoke with pt and she is aware of Dr. Lamar Sprinkles recommendation. Refill sent to pharmacy.

## 2012-05-09 NOTE — Telephone Encounter (Signed)
Pt states that she has had a bad headache since her egd with dil on Friday. States it is constant and she has to take Extra strength tylenol to control it. Pt wanted to know if it is related to the anesthesia she received. Pt also requests a refill on pantoprazole. Dr. Marina Goodell please advise regarding headache.

## 2012-05-10 ENCOUNTER — Other Ambulatory Visit: Payer: Medicare Other

## 2012-05-10 ENCOUNTER — Encounter: Payer: Self-pay | Admitting: Internal Medicine

## 2012-05-10 ENCOUNTER — Ambulatory Visit: Payer: Medicare Other | Admitting: Cardiology

## 2012-05-16 ENCOUNTER — Telehealth: Payer: Self-pay | Admitting: *Deleted

## 2012-05-16 ENCOUNTER — Other Ambulatory Visit: Payer: Self-pay | Admitting: *Deleted

## 2012-05-16 MED ORDER — PERPHENAZINE 2 MG PO TABS: 2.0000 mg | ORAL_TABLET | Freq: Every day | ORAL | Status: DC

## 2012-05-16 NOTE — Telephone Encounter (Signed)
Ordered in error. Called pharmacy and verbally cancelled.

## 2012-05-26 ENCOUNTER — Other Ambulatory Visit: Payer: Self-pay | Admitting: *Deleted

## 2012-05-26 MED ORDER — PERPHENAZINE 2 MG PO TABS: 2.0000 mg | ORAL_TABLET | Freq: Every day | ORAL | Status: DC

## 2012-06-07 ENCOUNTER — Encounter: Payer: Self-pay | Admitting: Internal Medicine

## 2012-06-07 ENCOUNTER — Ambulatory Visit (INDEPENDENT_AMBULATORY_CARE_PROVIDER_SITE_OTHER): Payer: Medicare Other | Admitting: Internal Medicine

## 2012-06-07 ENCOUNTER — Ambulatory Visit: Payer: Medicare Other | Admitting: Internal Medicine

## 2012-06-07 ENCOUNTER — Ambulatory Visit (INDEPENDENT_AMBULATORY_CARE_PROVIDER_SITE_OTHER)
Admission: RE | Admit: 2012-06-07 | Discharge: 2012-06-07 | Disposition: A | Discharge status: Home / Self Care | Payer: Medicare Other | Source: Ambulatory Visit | Attending: Internal Medicine | Admitting: Internal Medicine

## 2012-06-07 VITALS — BP 124/72 | HR 87 | Temp 99.1°F | Wt 157.4 lb

## 2012-06-07 DIAGNOSIS — M25519 Pain in unspecified shoulder: Secondary | ICD-10-CM | POA: Diagnosis not present

## 2012-06-07 DIAGNOSIS — M810 Age-related osteoporosis without current pathological fracture: Secondary | ICD-10-CM

## 2012-06-07 DIAGNOSIS — E538 Deficiency of other specified B group vitamins: Secondary | ICD-10-CM | POA: Diagnosis not present

## 2012-06-07 DIAGNOSIS — M545 Low back pain, unspecified: Secondary | ICD-10-CM

## 2012-06-07 DIAGNOSIS — M4310 Spondylolisthesis, site unspecified: Secondary | ICD-10-CM | POA: Diagnosis not present

## 2012-06-07 DIAGNOSIS — F419 Anxiety disorder, unspecified: Secondary | ICD-10-CM

## 2012-06-07 DIAGNOSIS — M25511 Pain in right shoulder: Secondary | ICD-10-CM

## 2012-06-07 DIAGNOSIS — F411 Generalized anxiety disorder: Secondary | ICD-10-CM

## 2012-06-07 DIAGNOSIS — M5137 Other intervertebral disc degeneration, lumbosacral region: Secondary | ICD-10-CM | POA: Diagnosis not present

## 2012-06-07 DIAGNOSIS — M47817 Spondylosis without myelopathy or radiculopathy, lumbosacral region: Secondary | ICD-10-CM | POA: Diagnosis not present

## 2012-06-07 MED ORDER — CYANOCOBALAMIN 1000 MCG/ML IJ SOLN
1000.0000 ug | Freq: Once | INTRAMUSCULAR | Status: AC
  Administered 2012-06-07: 1000 ug via INTRAMUSCULAR

## 2012-06-07 NOTE — Patient Instructions (Signed)
It was good to see you today. We have reviewed your prior records including labs and tests today xrays ordered today. Your results will be released to MyChart (or called to you) after review, usually within 72hours after test completion. If any changes need to be made, you will be notified at that same time. we'll make referral to orthopedic specialist . Our office will contact you regarding appointment(s) once made. Medications reviewed and updated, no changes at this time. Please schedule followup in 6 months, call sooner if problems.

## 2012-06-07 NOTE — Progress Notes (Signed)
Subjective:    Patient ID: Sarah [name], female    DOB: 1927-01-31, 79 y.o.   MRN: [id number]  HPI  Here for follow up - review chronic medical issues today:  Osteoporosis. Previously followed with rheumatology for same. Denies history fracture. No back pain or new bone pain. took weekly generic Fosamax for years, changed to [facility] in approximately 2010. Compliant with medications monthly and daily calcium plus vitamin D. Active weightbearing exercise as tolerated by arthritis, see next  Osteoarthritis. Diffuse including knees, low back and hands. Takes over-the-counter NSAIDs such as ibuprofen or Aleve on a scheduled basis daily. Occasional hydrocodone as needed for severe pain symptoms. Has worked with outpatient physical therapy. See ROS below re: back and shoulder  Bilateral feet edema. Ongoing since November 2012 chronically. Previously experienced edema only episodic related to beach travel and excessive salt consumption. In the past 3 months, chronic swelling of lower extremity and ankles/feet. Has been evaluated by cardiology for same. Slightly improved but not resolved with daily Lasix. Noncompliant with compression hose due to discomfort of fit.  Anxiety. Takes Xanax twice daily scheduled. Also associated with chronic insomnia for which she takes [employer]  Past Medical History  Diagnosis Date  . Osteoarthritis   . Bladder incontinence   . Constipation     hx of  . Hypertension   . Hyperlipidemia   . Chest pain     S/P nuclear 07/17/2008  normal; EF 88% with no ischemia  . Chronic venous insufficiency     BLE edema, chronic, neg venous US 2/13  . History of chicken pox   . Depression   . Diverticulitis   . Polyp of colon   . Osteoporosis, postmenopausal 06/02/2011  . Esophageal stricture     s/p dilation 04/2012  . GERD (gastroesophageal reflux disease)     Review of Systems  Constitutional: Negative for fever or weight change. complains of  fatigue Respiratory: Negative for cough and shortness of breath.   Cardiovascular: Negative for chest pain or palpitations.  Mskel: R low back pain x 2 months and R shoulder pain x 6 weeks      Objective:   Physical Exam  BP 124/72  Pulse 87  Temp(Src) 99.1 F (37.3 C) (Oral)  Wt 157 lb 6.4 oz (71.396 kg)  BMI 27.89 kg/m2  SpO2 96% Wt Readings from Last 3 Encounters:  06/07/12 157 lb 6.4 oz (71.396 kg)  05/05/12 156 lb (70.761 kg)  04/14/12 156 lb (70.761 kg)   Constitutional: She is elderly but spry and fit, appears well-developed and well-nourished. No distress.  Neck: Normal range of motion. Neck supple. No JVD present. No thyromegaly present.  Cardiovascular: Normal rate, regular rhythm and normal heart sounds.  No murmur heard. 1+ nonpitting BLE edema feet to below knee. Pulmonary/Chest: Effort normal and breath sounds normal. No respiratory distress. She has no wheezes.  Musculoskeletal: R Shoulder: Full range of motion. Neurovascularly intact distally. Good strength with stress of rotator cuff but causes pain. Positive impingement signs. OA change B hands and knees -but normal range of motion, no joint effusions. No gross deformities. Back: full range of motion of thoracic and lumbar spine. Non tender to palpation. DTR's are symmetrically intact. Sensation intact in all dermatomes of the lower extremities. Full strength to manual muscle testing. patient is able to heel toe walk without difficulty and ambulates with antalgic gait. Neurological: She is alert and oriented to person, place, and time. No cranial nerve deficit. Coordination normal.  Psychiatric: She has a normal mood and affect. Her behavior is normal. Judgment and thought content normal.   Lab Results  Component Value Date   WBC 7.5 06/09/2011   HGB 11.9* 06/09/2011   HCT 36.2 06/09/2011   PLT 288.0 06/09/2011   GLUCOSE 102* 06/09/2011   CHOL 166 06/09/2011   TRIG 116.0 06/09/2011   HDL 69.70 06/09/2011   LDLDIRECT  101.1 10/21/2010   LDLCALC 73 06/09/2011   ALT 20 06/09/2011   AST 14 06/09/2011   NA 134* 06/09/2011   K 4.7 06/09/2011   CL 100 06/09/2011   CREATININE 0.9 06/09/2011   BUN 14 06/09/2011   CO2 27 06/09/2011   No results found for this basename: VITAMINB12      Assessment & Plan:   see problem list. Medications and labs reviewed today.  R low back pain, no injury or falls recalled - suspect DDD flare with Mskel spasm - however, given osteoporosis and OA, check DG to exclude DDD or compression fx - advised tylenol as ongoing, consider alternate NSAIDs (alevel bid ongoing)

## 2012-06-07 NOTE — Assessment & Plan Note (Signed)
Previously managed by rheum - pt denies hx fractures On weekly fosamax for years, changed to monthly Boniva in ?2010 Continue OTC ca+D - check DEXA now The current medical regimen is effective;  continue present plan and medications.

## 2012-06-07 NOTE — Assessment & Plan Note (Signed)
No evidence for RTC instability - suspect impingement -  Check plain xray to look for spur or DJD Continue NSAIDs and consider ortho eval if unimproved

## 2012-06-07 NOTE — Assessment & Plan Note (Signed)
Continue daily Xanax as has been on same chronically for years. Also Pamelor and Halicon for sleep, no change recommended

## 2012-06-08 ENCOUNTER — Telehealth: Payer: Self-pay | Admitting: *Deleted

## 2012-06-08 NOTE — Telephone Encounter (Signed)
Pt stated she forgot to let md know that she been having problems with anal fissures. Been using miralax QOD to help with her constipation. Wanting advisement what to do abt the anal fissues. Also she been having some pain in her rib cage area should have got xray but she forgot. Inform pt md has already left for today will be tomorrow before she address...lmb

## 2012-06-09 ENCOUNTER — Ambulatory Visit: Payer: Medicare Other | Admitting: Cardiology

## 2012-06-09 ENCOUNTER — Other Ambulatory Visit: Payer: Medicare Other

## 2012-06-09 NOTE — Telephone Encounter (Signed)
Continue miralax qd, rather than qod - and use OTC Prep H as per box label Use ibuprofen 400mg  TID for rib pain and sched OV if unimproved in next 1-2 weeks

## 2012-06-09 NOTE — Telephone Encounter (Signed)
Notified pt with md response. Pt agreed with instructions. Now what to ask md currently taking furosemide as needed for swelling. Pt states she been having swelling in her ankles should she take furosemide everyday or does md recommend something else...Sarah [name]

## 2012-06-09 NOTE — Telephone Encounter (Signed)
Notified pt with md response.../lmb 

## 2012-06-09 NOTE — Telephone Encounter (Signed)
Ok to use furosemide daily if needed

## 2012-06-14 ENCOUNTER — Telehealth: Payer: Self-pay | Admitting: *Deleted

## 2012-06-14 DIAGNOSIS — H524 Presbyopia: Secondary | ICD-10-CM | POA: Diagnosis not present

## 2012-06-14 DIAGNOSIS — H43399 Other vitreous opacities, unspecified eye: Secondary | ICD-10-CM | POA: Diagnosis not present

## 2012-06-14 DIAGNOSIS — H04129 Dry eye syndrome of unspecified lacrimal gland: Secondary | ICD-10-CM | POA: Diagnosis not present

## 2012-06-14 DIAGNOSIS — H35369 Drusen (degenerative) of macula, unspecified eye: Secondary | ICD-10-CM | POA: Diagnosis not present

## 2012-06-14 DIAGNOSIS — H2510 Age-related nuclear cataract, unspecified eye: Secondary | ICD-10-CM | POA: Diagnosis not present

## 2012-06-14 DIAGNOSIS — H353190 Nonexudative age-related macular degeneration, unspecified eye, stage unspecified: Secondary | ICD-10-CM | POA: Diagnosis not present

## 2012-06-14 NOTE — Telephone Encounter (Signed)
Patient has been referred to orthopedics, not rheumatology, for her back pain -  She can followup with [facility] regarding ortho referral if needed - since Dr. Drusilla Kanner is NOT an ortho, referral to her is not needed at this time Thanks

## 2012-06-14 NOTE — Telephone Encounter (Signed)
Called pt again still no answer LMOM md response...lmb

## 2012-06-14 NOTE — Telephone Encounter (Signed)
Called pt answer LMOM RTC...Raechel Chute

## 2012-06-14 NOTE — Telephone Encounter (Signed)
Left msg on vm stating wanting to know was md going to refer her to a rheumatologist. Daughter recommend Dr. Marya Landry. Wanting to know if md familiar with him.Marland KitchenRaechel [name]

## 2012-06-21 ENCOUNTER — Other Ambulatory Visit: Payer: Self-pay | Admitting: *Deleted

## 2012-06-21 MED ORDER — TRIAZOLAM 0.25 MG PO TABS: 0.1250 mg | ORAL_TABLET | Freq: Every evening | ORAL | Status: DC | PRN

## 2012-06-21 NOTE — Telephone Encounter (Signed)
Faxed script back to [facility].../lmb 

## 2012-06-23 DIAGNOSIS — M719 Bursopathy, unspecified: Secondary | ICD-10-CM | POA: Diagnosis not present

## 2012-06-23 DIAGNOSIS — M47817 Spondylosis without myelopathy or radiculopathy, lumbosacral region: Secondary | ICD-10-CM | POA: Diagnosis not present

## 2012-06-28 ENCOUNTER — Ambulatory Visit (INDEPENDENT_AMBULATORY_CARE_PROVIDER_SITE_OTHER): Payer: Medicare Other | Admitting: Cardiology

## 2012-06-28 ENCOUNTER — Other Ambulatory Visit (INDEPENDENT_AMBULATORY_CARE_PROVIDER_SITE_OTHER): Payer: Medicare Other

## 2012-06-28 ENCOUNTER — Encounter: Payer: Self-pay | Admitting: Cardiology

## 2012-06-28 VITALS — BP 146/78 | HR 80 | Ht 62.0 in | Wt 153.0 lb

## 2012-06-28 DIAGNOSIS — M25519 Pain in unspecified shoulder: Secondary | ICD-10-CM

## 2012-06-28 DIAGNOSIS — R0989 Other specified symptoms and signs involving the circulatory and respiratory systems: Secondary | ICD-10-CM

## 2012-06-28 DIAGNOSIS — Z13220 Encounter for screening for lipoid disorders: Secondary | ICD-10-CM

## 2012-06-28 DIAGNOSIS — I119 Hypertensive heart disease without heart failure: Secondary | ICD-10-CM

## 2012-06-28 DIAGNOSIS — R5383 Other fatigue: Secondary | ICD-10-CM

## 2012-06-28 DIAGNOSIS — R5381 Other malaise: Secondary | ICD-10-CM | POA: Diagnosis not present

## 2012-06-28 DIAGNOSIS — E7800 Pure hypercholesterolemia, unspecified: Secondary | ICD-10-CM

## 2012-06-28 DIAGNOSIS — M25511 Pain in right shoulder: Secondary | ICD-10-CM

## 2012-06-28 LAB — LIPID PANEL
Cholesterol: 193 mg/dL (ref 0–200)
HDL: 92.2 mg/dL (ref >39.00)
VLDL: 12 mg/dL (ref 0.0–40.0)

## 2012-06-28 LAB — HEPATIC FUNCTION PANEL
ALT: 13 U/L (ref 0–35)
AST: 9 U/L (ref 0–37)
Total Bilirubin: 0.4 mg/dL (ref 0.3–1.2)
Total Protein: 7.1 g/dL (ref 6.0–8.3)

## 2012-06-28 LAB — CBC WITH DIFFERENTIAL/PLATELET
Basophils Absolute: 0 10*3/uL (ref 0.0–0.1)
Eosinophils Absolute: 0.1 10*3/uL (ref 0.0–0.7)
Lymphocytes Relative: 32.8 % (ref 12.0–46.0)
MCHC: 33.5 g/dL (ref 30.0–36.0)
Monocytes Relative: 12.2 % — ABNORMAL HIGH (ref 3.0–12.0)
Neutrophils Relative %: 53.1 % (ref 43.0–77.0)
RBC: 3.84 Mil/uL — ABNORMAL LOW (ref 3.87–5.11)
RDW: 13.7 % (ref 11.5–14.6)

## 2012-06-28 LAB — BASIC METABOLIC PANEL
BUN: 16 mg/dL (ref 6–23)
Calcium: 9.1 mg/dL (ref 8.4–10.5)
GFR: 79.5 mL/min (ref >60.00)
Glucose, Bld: 93 mg/dL (ref 70–99)
Sodium: 131 mEq/L — ABNORMAL LOW (ref 135–145)

## 2012-06-28 LAB — TSH: TSH: 5.91 u[IU]/mL — ABNORMAL HIGH (ref 0.35–5.50)

## 2012-06-28 NOTE — Progress Notes (Signed)
Quick Note:  Please report to patient. The recent labs are stable. Continue same medication and careful diet. No anemia. TSH is slightly high. Recheck TSH and get FT4 at next OV ______

## 2012-06-28 NOTE — Patient Instructions (Addendum)
Will obtain labs today and call you with the results ([email or web address])  Your physician recommends that you continue on your current medications as directed. Please refer to the Current Medication list given to you today.  Your physician wants you to follow-up in: 4 months with fasting labs (lp/bmet/hfp)  You will receive a reminder letter in the mail two months in advance. If you don't receive a letter, please call our office to schedule the follow-up appointment.

## 2012-06-28 NOTE — Assessment & Plan Note (Signed)
The patient recently underwent steroid injection into her right shoulder by her orthopedist Dr. Adriana Simas who is with [facility]

## 2012-06-28 NOTE — Assessment & Plan Note (Signed)
The patient has occasional chest pain and has sublingual nitroglycerin on hand.  There has been no progression of the symptoms.

## 2012-06-28 NOTE — Assessment & Plan Note (Signed)
The patient is pleased that her weight is down 5 pounds.  She is not on statins.  We are checking lab work today

## 2012-06-28 NOTE — Progress Notes (Signed)
Rande Lawman Date of Birth:  1926-08-26 [facility] [address] [place], [place]  [address] [phone]         Fax   [phone]  History of Present Illness: This pleasant 79 year old widowed Caucasian female is seen for a four-month followup office visit. He has a past history of atypical chest pain. She does not have any history of ischemic heart disease. She had a normal nuclear stress test in April 2012. She has a normal left ventricular function with ejection fraction of 88%. She does have a history of hypercholesterolemia but is intolerant of statins because of leg weakness and memory loss.  Since last visit she is been stable from a cardiac standpoint.  She did have a piece of lobster stuck in her esophageal stricture over the Christmas holidays and had to be seen at [facility] emergency room.  8 weeks later she underwent dilatation of her esophageal stricture by Dr. Yancey Flemings.  This was her 13th time that her esophagus had been dilated.   Current Outpatient Prescriptions  Medication Sig Dispense Refill  . Acetaminophen (TYLENOL ARTHRITIS PAIN PO) Take by mouth 2 (two) times daily.        Marland Kitchen ALPRAZolam (XANAX) 1 MG tablet Take 1 tablet (1 mg total) by mouth 2 (two) times daily.  60 tablet  3  . aspirin 81 MG tablet Take 81 mg by mouth daily.      . Calcium Carbonate-Vit D-Min (CALCIUM 1200 PO) Take 1 tablet by mouth daily.      . Cholecalciferol (VITAMIN D PO) Take by mouth. Taking 2000 daily       . clobetasol ointment (TEMOVATE) 0.05 % Apply 1 application topically 2 (two) times daily.       . cyanocobalamin (,VITAMIN B-12,) 1000 MCG/ML injection Inject 1 mL (1,000 mcg total) into the muscle every 30 (thirty) days.  1 mL  3  . estradiol (ESTRACE VAGINAL) 0.1 MG/GM vaginal cream Place 2 g vaginally 2 (two) times a week. As needed  42.5 g  12  . furosemide (LASIX) 20 MG tablet 20 mg as needed.      Marland Kitchen HYDROcodone-acetaminophen (VICODIN) 5-500 MG per tablet Take 1  tablet by mouth as needed.      . ibandronate (BONIVA) 150 MG tablet Take 150 mg by mouth every 30 (thirty) days.       Marland Kitchen ibuprofen (ADVIL,MOTRIN) 200 MG tablet Take 200 mg by mouth 2 (two) times daily before a meal.      . nitroGLYCERIN (NITROSTAT) 0.4 MG SL tablet Place 0.4 mg under the tongue every 5 (five) minutes as needed for chest pain.      . nortriptyline (PAMELOR) 25 MG capsule Take 1 capsule (25 mg total) by mouth at bedtime.  30 capsule  0  . Omega-3 Fatty Acids (FISH OIL) 1200 MG CAPS Take 1 capsule by mouth daily.      . pantoprazole (PROTONIX) 40 MG tablet Take 1 tablet (40 mg total) by mouth daily.  30 tablet  6  . perphenazine (TRILAFON) 2 MG tablet Take 1 tablet (2 mg total) by mouth at bedtime.  30 tablet  6  . polyethylene glycol (MIRALAX / GLYCOLAX) packet Take 17 g by mouth daily. As needed       . solifenacin (VESICARE) 10 MG tablet Take 10 mg by mouth as needed.       . triazolam (HALCION) 0.25 MG tablet Take 0.5-1 tablets (0.125-0.25 mg total) by mouth at  bedtime as needed.  30 tablet  5   No current facility-administered medications for this visit.    Allergies  Allergen Reactions  . Celebrex (Celecoxib)     rash  . Lipitor (Atorvastatin Calcium)     Leg weakness,memory loss  . Lovastatin     Leg weakness  . Other     Always pads  . Latex Rash    Patient Active Problem List  Diagnosis  . Osteoarthritis  . Chest pain  . Edema  . Tachycardia  . Osteoporosis, postmenopausal  . Anxiety  . Mixed incontinence urge and stress  . Malaise and fatigue  . Hypercholesterolemia  . Vitamin B12 deficiency  . Right shoulder pain    History  Smoking status  . Never Smoker   Smokeless tobacco  . Never Used    History  Alcohol Use No    Family History  Problem Relation Age of Onset  . Cancer Son     testicular cancer  . Colon cancer Neg Hx   . Esophageal cancer Neg Hx   . Rectal cancer Neg Hx   . Stomach cancer Neg Hx   . Breast cancer Sister   .  Skin cancer Sister     Review of Systems: Constitutional: no fever chills diaphoresis or fatigue or change in weight.  Head and neck: no hearing loss, no epistaxis, no photophobia or visual disturbance. Respiratory: No cough, shortness of breath or wheezing. Cardiovascular: No chest pain peripheral edema, palpitations. Gastrointestinal: No abdominal distention, no abdominal pain, no change in bowel habits hematochezia or melena. Genitourinary: No dysuria, no frequency, no urgency, no nocturia. Musculoskeletal:No arthralgias, no back pain, no gait disturbance or myalgias. Neurological: No dizziness, no headaches, no numbness, no seizures, no syncope, no weakness, no tremors. Hematologic: No lymphadenopathy, no easy bruising. Psychiatric: No confusion, no hallucinations, no sleep disturbance.    Physical Exam: Filed Vitals:   06/28/12 1110  BP: 146/78  Pulse: 80   The general appearance reveals a well-developed well-nourished elderly woman in no distress.The head and neck exam reveals pupils equal and reactive.  Extraocular movements are full.  There is no scleral icterus.  The mouth and pharynx are normal.  The neck is supple.  The carotids reveal no bruits.  The jugular venous pressure is normal.  The  thyroid is not enlarged.  There is no lymphadenopathy.  The chest is clear to percussion and auscultation.  There are no rales or rhonchi.  Expansion of the chest is symmetrical.  The precordium is quiet.  The first heart sound is normal.  The second heart sound is physiologically split.  There is no murmur gallop rub or click.  There is no abnormal lift or heave.  The abdomen is soft and nontender.  The bowel sounds are normal.  The liver and spleen are not enlarged.  There are no abdominal masses.  There are no abdominal bruits.  Extremities reveal good pedal pulses.  There is no phlebitis or edema.  There is no cyanosis or clubbing.  Strength is normal and symmetrical in all extremities.   There is no lateralizing weakness.  There are no sensory deficits.  The skin is warm and dry.  There is no rash.  EKG shows normal sinus rhythm and is within normal limits.  Assessment / Plan:   Same medication.  Continue improvement diet.  Recheck in 4 months for followup office visit and lab work.

## 2012-06-29 ENCOUNTER — Telehealth: Payer: Self-pay | Admitting: *Deleted

## 2012-06-29 DIAGNOSIS — E039 Hypothyroidism, unspecified: Secondary | ICD-10-CM

## 2012-06-29 DIAGNOSIS — R899 Unspecified abnormal finding in specimens from other organs, systems and tissues: Secondary | ICD-10-CM

## 2012-06-29 NOTE — Telephone Encounter (Signed)
Message copied by Burnell Blanks on Thu Jun 29, 2012  6:25 PM ------      Message from: Cassell Clement      Created: Wed Jun 28, 2012  9:39 PM       Please report to patient.  The recent labs are stable. Continue same medication and careful diet. No anemia. TSH is slightly high.  Recheck TSH and get FT4 at next OV ------

## 2012-06-29 NOTE — Telephone Encounter (Signed)
Left msg on vm stating she been having cramping in her legs at night. Requesting md to suggest something for her leg cramps. Will be leaving out can leave msg on vm...lmb

## 2012-06-29 NOTE — Telephone Encounter (Signed)
Called pt no answer LMOM md response...lmb 

## 2012-06-29 NOTE — Telephone Encounter (Signed)
Advised patient of lab results and will recheck labs at next ov

## 2012-06-29 NOTE — Telephone Encounter (Signed)
8oz tonic water at bedtime and iron pills 2x/day - OV if worse or unimproved

## 2012-07-03 ENCOUNTER — Other Ambulatory Visit: Payer: Self-pay

## 2012-07-03 ENCOUNTER — Telehealth: Payer: Self-pay | Admitting: Internal Medicine

## 2012-07-03 DIAGNOSIS — Z1231 Encounter for screening mammogram for malignant neoplasm of breast: Secondary | ICD-10-CM

## 2012-07-04 ENCOUNTER — Other Ambulatory Visit: Payer: Self-pay | Admitting: Dermatology

## 2012-07-04 DIAGNOSIS — L820 Inflamed seborrheic keratosis: Secondary | ICD-10-CM | POA: Diagnosis not present

## 2012-07-04 DIAGNOSIS — L821 Other seborrheic keratosis: Secondary | ICD-10-CM | POA: Diagnosis not present

## 2012-07-04 DIAGNOSIS — D485 Neoplasm of uncertain behavior of skin: Secondary | ICD-10-CM | POA: Diagnosis not present

## 2012-07-04 DIAGNOSIS — D1801 Hemangioma of skin and subcutaneous tissue: Secondary | ICD-10-CM | POA: Diagnosis not present

## 2012-07-04 NOTE — Telephone Encounter (Signed)
Pt states she has a "pocket of gas" on her left side from the EGD she had done in February. Discussed with pt that she should not have gas from that procedure. Pt states she is not having any pain or discomfort from this but that she can "hear it." Instructed pt to try taking some gas-x and see if that helps. Pt verbalized understanding.

## 2012-07-04 NOTE — Telephone Encounter (Signed)
Left message for pt to call back  °

## 2012-07-10 DIAGNOSIS — L600 Ingrowing nail: Secondary | ICD-10-CM | POA: Diagnosis not present

## 2012-07-18 ENCOUNTER — Other Ambulatory Visit: Payer: Self-pay | Admitting: *Deleted

## 2012-07-18 ENCOUNTER — Telehealth: Payer: Self-pay | Admitting: Internal Medicine

## 2012-07-18 MED ORDER — IBANDRONATE SODIUM 150 MG PO TABS: 150.0000 mg | ORAL_TABLET | ORAL | Status: DC

## 2012-07-18 NOTE — Telephone Encounter (Signed)
Pt aware. States she has some dental work coming up and they are pulling the "big one." Pt states she will go back on the gas-x and keep massaging the area and call us back for an appointment once she gets this dental work behind her. Dr. Marina Goodell aware.

## 2012-07-18 NOTE — Telephone Encounter (Signed)
I'm not sure what this symptom might stem from. It may not be anything. However, she should have assessed formally. Have her see one of the mid-levels, as not to delay her assessment. Let her know that I will supervise their work and be involved in her care as needed.

## 2012-07-18 NOTE — Telephone Encounter (Signed)
Pt states she had an egd with dil in February and she still has a pocket of gas in her abdomen and it is bothering her. Pt called April 7th with the same complaint. Discussed with pt and instructed her to take gas-x otc and see if that helped. Pt states she has tried the gas-x and massaging the area and it has not helped. States that when she sits on the toilet and reaches for the tissue she feels the pocket of gas and it is worrisome. Offered to schedule pt for an OV with midlevel but she wants to know what Dr. Marina Goodell would recommend. Please advise.

## 2012-07-24 ENCOUNTER — Ambulatory Visit (INDEPENDENT_AMBULATORY_CARE_PROVIDER_SITE_OTHER): Payer: BLUE CROSS/BLUE SHIELD | Admitting: *Deleted

## 2012-07-24 DIAGNOSIS — E538 Deficiency of other specified B group vitamins: Secondary | ICD-10-CM

## 2012-07-24 DIAGNOSIS — H5200 Hypermetropia, unspecified eye: Secondary | ICD-10-CM | POA: Diagnosis not present

## 2012-07-24 DIAGNOSIS — H524 Presbyopia: Secondary | ICD-10-CM | POA: Diagnosis not present

## 2012-07-24 MED ORDER — CYANOCOBALAMIN 1000 MCG/ML IJ SOLN
1000.0000 ug | Freq: Once | INTRAMUSCULAR | Status: AC
  Administered 2012-07-24: 1000 ug via INTRAMUSCULAR

## 2012-07-28 ENCOUNTER — Ambulatory Visit: Payer: Medicare Other

## 2012-08-11 ENCOUNTER — Ambulatory Visit: Payer: Medicare Other

## 2012-08-14 ENCOUNTER — Ambulatory Visit (INDEPENDENT_AMBULATORY_CARE_PROVIDER_SITE_OTHER): Payer: Medicare Other | Admitting: *Deleted

## 2012-08-14 DIAGNOSIS — E538 Deficiency of other specified B group vitamins: Secondary | ICD-10-CM

## 2012-08-14 MED ORDER — CYANOCOBALAMIN 1000 MCG/ML IJ SOLN
1000.0000 ug | Freq: Once | INTRAMUSCULAR | Status: AC
  Administered 2012-08-14: 1000 ug via INTRAMUSCULAR

## 2012-08-15 ENCOUNTER — Other Ambulatory Visit: Payer: Self-pay | Admitting: *Deleted

## 2012-08-15 DIAGNOSIS — F419 Anxiety disorder, unspecified: Secondary | ICD-10-CM

## 2012-08-16 ENCOUNTER — Ambulatory Visit: Payer: Medicare Other

## 2012-08-16 ENCOUNTER — Other Ambulatory Visit: Payer: Self-pay | Admitting: *Deleted

## 2012-08-16 DIAGNOSIS — F419 Anxiety disorder, unspecified: Secondary | ICD-10-CM

## 2012-08-16 MED ORDER — ALPRAZOLAM 1 MG PO TABS: 1.0000 mg | ORAL_TABLET | Freq: Two times a day (BID) | ORAL | Status: DC

## 2012-08-16 NOTE — Telephone Encounter (Signed)
Called [facility] spoke with [place] gave md approval status,,,/lmb

## 2012-08-20 MED ORDER — ALPRAZOLAM 1 MG PO TABS: 1.0000 mg | ORAL_TABLET | Freq: Two times a day (BID) | ORAL | Status: DC | PRN

## 2012-08-23 ENCOUNTER — Other Ambulatory Visit: Payer: Self-pay | Admitting: *Deleted

## 2012-08-23 MED ORDER — SOLIFENACIN SUCCINATE 10 MG PO TABS: 10.0000 mg | ORAL_TABLET | ORAL | Status: DC | PRN

## 2012-08-23 NOTE — Telephone Encounter (Signed)
Discussed with  Dr. Patty Sermons and ok to fill Vesicare

## 2012-09-08 ENCOUNTER — Ambulatory Visit: Payer: Medicare Other

## 2012-09-13 ENCOUNTER — Other Ambulatory Visit: Payer: Self-pay | Admitting: Cardiology

## 2012-09-22 ENCOUNTER — Other Ambulatory Visit: Payer: Self-pay | Admitting: *Deleted

## 2012-09-22 NOTE — Telephone Encounter (Signed)
Received call from [employer] halcion has been approve starting today until 09/22/13. Will mail pt & md approval letter. Notified pharmacy spoke with Tresa Endo she states they had fill rx for generic will not be able to get for another 17 days. Will keep on file...Raechel Chute

## 2012-09-22 NOTE — Telephone Encounter (Signed)
Received fax pt needing PA on Halcion. Notified insurance they fax over PA form has been completed and fax back waiting on approval status...Raechel Chute

## 2012-10-06 ENCOUNTER — Ambulatory Visit: Payer: Medicare Other

## 2012-10-11 ENCOUNTER — Ambulatory Visit: Payer: Medicare Other

## 2012-10-16 ENCOUNTER — Other Ambulatory Visit: Payer: Self-pay | Admitting: Cardiology

## 2012-10-20 ENCOUNTER — Ambulatory Visit (INDEPENDENT_AMBULATORY_CARE_PROVIDER_SITE_OTHER): Payer: Medicare Other | Admitting: Cardiology

## 2012-10-20 ENCOUNTER — Encounter: Payer: Self-pay | Admitting: Cardiology

## 2012-10-20 VITALS — BP 140/76 | HR 64 | Ht 63.0 in | Wt 157.8 lb

## 2012-10-20 DIAGNOSIS — R609 Edema, unspecified: Secondary | ICD-10-CM

## 2012-10-20 DIAGNOSIS — E7800 Pure hypercholesterolemia, unspecified: Secondary | ICD-10-CM | POA: Diagnosis not present

## 2012-10-20 NOTE — Assessment & Plan Note (Signed)
The patient has had only one episode of chest pain since last visit.  It occurred at rest and lasted approximately 45 seconds and went away without specific therapy.  She did not have to take any nitroglycerin.

## 2012-10-20 NOTE — Assessment & Plan Note (Signed)
The patient has no significant edema at rest.  However when she goes down to the beach later this year she will be planning to take her furosemide with her because she almost always gets severe pedal edema at the beach.

## 2012-10-20 NOTE — Patient Instructions (Addendum)
Work harder on diet and weight loss  Your physician recommends that you continue on your current medications as directed. Please refer to the Current Medication list given to you today.  Your physician wants you to follow-up in: 4 months with fasting labs (lp/bmet/hfp) You will receive a reminder letter in the mail two months in advance. If you don't receive a letter, please call our office to schedule the follow-up appointment.  

## 2012-10-20 NOTE — Progress Notes (Signed)
Rande Lawman Date of Birth:  1927-03-03 [facility] [address] [place], [place]  [address] [phone]         Fax   [phone]  History of Present Illness: This pleasant 79 year old widowed Caucasian female is seen for a four-month followup office visit. He has a past history of atypical chest pain. She does not have any history of ischemic heart disease. She had a normal nuclear stress test in April 2012. She has a normal left ventricular function with ejection fraction of 88%. She does have a history of hypercholesterolemia but is intolerant of statins because of leg weakness and memory loss. Since last visit she is been stable from a cardiac standpoint.  There was a recent family reunion and the patient ate well and her weight is up 4 pounds.  Current Outpatient Prescriptions  Medication Sig Dispense Refill  . Acetaminophen (TYLENOL ARTHRITIS PAIN PO) Take by mouth 2 (two) times daily.        Marland Kitchen ALPRAZolam (XANAX) 1 MG tablet Take 1 tablet (1 mg total) by mouth 2 (two) times daily as needed.  60 tablet  5  . aspirin 81 MG tablet Take 81 mg by mouth daily.      . Calcium Carbonate-Vit D-Min (CALCIUM 1200 PO) Take 1 tablet by mouth daily.      . Cholecalciferol (VITAMIN D PO) Take by mouth. Taking 2000 daily       . clobetasol ointment (TEMOVATE) 0.05 % Apply 1 application topically 2 (two) times daily.       . cyanocobalamin (,VITAMIN B-12,) 1000 MCG/ML injection Inject 1 mL (1,000 mcg total) into the muscle every 30 (thirty) days.  1 mL  3  . estradiol (ESTRACE VAGINAL) 0.1 MG/GM vaginal cream Place 2 g vaginally 2 (two) times a week. As needed  42.5 g  12  . furosemide (LASIX) 20 MG tablet Take 1 tablet (20 mg total) by mouth as needed.  30 tablet  5  . HYDROcodone-acetaminophen (VICODIN) 5-500 MG per tablet Take 1 tablet by mouth as needed.      . ibandronate (BONIVA) 150 MG tablet Take 1 tablet (150 mg total) by mouth every 30 (thirty) days.  1 tablet  5  .  ibuprofen (ADVIL,MOTRIN) 200 MG tablet Take 200 mg by mouth 2 (two) times daily before a meal.      . nitroGLYCERIN (NITROSTAT) 0.4 MG SL tablet Place 0.4 mg under the tongue every 5 (five) minutes as needed for chest pain.      . nortriptyline (PAMELOR) 25 MG capsule TAKE 1 CAPSULE AT BEDTIME.  30 capsule  5  . Omega-3 Fatty Acids (FISH OIL) 1200 MG CAPS Take 1 capsule by mouth daily.      . pantoprazole (PROTONIX) 40 MG tablet Take 1 tablet (40 mg total) by mouth daily.  30 tablet  6  . perphenazine (TRILAFON) 2 MG tablet Take 1 tablet (2 mg total) by mouth at bedtime.  30 tablet  6  . polyethylene glycol (MIRALAX / GLYCOLAX) packet Take 17 g by mouth daily. As needed       . solifenacin (VESICARE) 10 MG tablet Take 1 tablet (10 mg total) by mouth as needed.  30 tablet  5  . triazolam (HALCION) 0.25 MG tablet Take 0.5-1 tablets (0.125-0.25 mg total) by mouth at bedtime as needed.  30 tablet  5  . [DISCONTINUED] solifenacin (VESICARE) 10 MG tablet Take 10 mg by mouth as needed.  No current facility-administered medications for this visit.    Allergies  Allergen Reactions  . Celebrex (Celecoxib)     rash  . Lipitor (Atorvastatin Calcium)     Leg weakness,memory loss  . Lovastatin     Leg weakness  . Other     Always pads  . Latex Rash    Patient Active Problem List   Diagnosis Date Noted  . Right shoulder pain   . Vitamin B12 deficiency 04/13/2012  . Malaise and fatigue 11/05/2011  . Hypercholesterolemia 11/05/2011  . Osteoporosis, postmenopausal 06/02/2011  . Mixed incontinence urge and stress 06/02/2011  . Anxiety   . Edema 04/06/2011  . Tachycardia 04/06/2011  . Osteoarthritis   . Chest pain     History  Smoking status  . Never Smoker   Smokeless tobacco  . Never Used    History  Alcohol Use No    Family History  Problem Relation Age of Onset  . Cancer Son     testicular cancer  . Colon cancer Neg Hx   . Esophageal cancer Neg Hx   . Rectal cancer  Neg Hx   . Stomach cancer Neg Hx   . Breast cancer Sister   . Skin cancer Sister     Review of Systems: Constitutional: no fever chills diaphoresis or fatigue or change in weight.  Head and neck: no hearing loss, no epistaxis, no photophobia or visual disturbance. Respiratory: No cough, shortness of breath or wheezing. Cardiovascular: No chest pain peripheral edema, palpitations. Gastrointestinal: No abdominal distention, no abdominal pain, no change in bowel habits hematochezia or melena. Genitourinary: No dysuria, no frequency, no urgency, no nocturia. Musculoskeletal:No arthralgias, no back pain, no gait disturbance or myalgias. Neurological: No dizziness, no headaches, no numbness, no seizures, no syncope, no weakness, no tremors. Hematologic: No lymphadenopathy, no easy bruising. Psychiatric: No confusion, no hallucinations, no sleep disturbance.    Physical Exam: Filed Vitals:   10/20/12 1606  BP: 140/76  Pulse: 64   the general appearance reveals a pleasant elderly woman in no distress.The head and neck exam reveals pupils equal and reactive.  Extraocular movements are full.  There is no scleral icterus.  The mouth and pharynx are normal.  The neck is supple.  The carotids reveal no bruits.  The jugular venous pressure is normal.  The  thyroid is not enlarged.  There is no lymphadenopathy.  The chest is clear to percussion and auscultation.  There are no rales or rhonchi.  Expansion of the chest is symmetrical.  The precordium is quiet.  The first heart sound is normal.  The second heart sound is physiologically split.  There is no murmur gallop rub or click.  There is no abnormal lift or heave.  The abdomen is soft and nontender.  The bowel sounds are normal.  The liver and spleen are not enlarged.  There are no abdominal masses.  There are no abdominal bruits.  Extremities reveal good pedal pulses.  There is no phlebitis or edema.  There is no cyanosis or clubbing.  Strength is  normal and symmetrical in all extremities.  There is no lateralizing weakness.  There are no sensory deficits.  The skin is warm and dry.  There is no rash.     Assessment / Plan: Continue same medication.  Recheck in 4 months for followup office visit lipid panel hepatic function panel and basal metabolic panel.  I want her to lose weight before next visit.

## 2012-10-20 NOTE — Assessment & Plan Note (Signed)
Patient has a history of hypercholesterolemia.  She is intolerant of statins.  She is trying to watch her diet.  We will plan to recheck fasting lipids at her next visit

## 2012-10-30 DIAGNOSIS — L821 Other seborrheic keratosis: Secondary | ICD-10-CM | POA: Diagnosis not present

## 2012-10-30 DIAGNOSIS — L819 Disorder of pigmentation, unspecified: Secondary | ICD-10-CM | POA: Diagnosis not present

## 2012-10-30 DIAGNOSIS — L820 Inflamed seborrheic keratosis: Secondary | ICD-10-CM | POA: Diagnosis not present

## 2012-11-02 ENCOUNTER — Ambulatory Visit (INDEPENDENT_AMBULATORY_CARE_PROVIDER_SITE_OTHER): Payer: Medicare Other | Admitting: *Deleted

## 2012-11-02 DIAGNOSIS — E538 Deficiency of other specified B group vitamins: Secondary | ICD-10-CM

## 2012-11-02 MED ORDER — CYANOCOBALAMIN 1000 MCG/ML IJ SOLN
1000.0000 ug | Freq: Once | INTRAMUSCULAR | Status: AC
  Administered 2012-11-02: 1000 ug via INTRAMUSCULAR

## 2012-11-06 ENCOUNTER — Ambulatory Visit
Admission: RE | Admit: 2012-11-06 | Discharge: 2012-11-06 | Disposition: A | Discharge status: Home / Self Care | Payer: Medicare Other | Source: Ambulatory Visit

## 2012-11-06 DIAGNOSIS — Z1231 Encounter for screening mammogram for malignant neoplasm of breast: Secondary | ICD-10-CM

## 2012-11-07 ENCOUNTER — Ambulatory Visit: Payer: Medicare Other

## 2012-12-06 ENCOUNTER — Ambulatory Visit (INDEPENDENT_AMBULATORY_CARE_PROVIDER_SITE_OTHER): Payer: Medicare Other | Admitting: Internal Medicine

## 2012-12-06 ENCOUNTER — Encounter: Payer: Self-pay | Admitting: Internal Medicine

## 2012-12-06 ENCOUNTER — Other Ambulatory Visit (INDEPENDENT_AMBULATORY_CARE_PROVIDER_SITE_OTHER): Payer: Medicare Other

## 2012-12-06 VITALS — BP 132/70 | HR 92 | Temp 98.0°F | Wt 159.4 lb

## 2012-12-06 DIAGNOSIS — Z23 Encounter for immunization: Secondary | ICD-10-CM

## 2012-12-06 DIAGNOSIS — M62830 Muscle spasm of back: Secondary | ICD-10-CM

## 2012-12-06 DIAGNOSIS — R29898 Other symptoms and signs involving the musculoskeletal system: Secondary | ICD-10-CM

## 2012-12-06 DIAGNOSIS — F419 Anxiety disorder, unspecified: Secondary | ICD-10-CM

## 2012-12-06 DIAGNOSIS — R6889 Other general symptoms and signs: Secondary | ICD-10-CM | POA: Diagnosis not present

## 2012-12-06 DIAGNOSIS — N760 Acute vaginitis: Secondary | ICD-10-CM

## 2012-12-06 DIAGNOSIS — M5380 Other specified dorsopathies, site unspecified: Secondary | ICD-10-CM

## 2012-12-06 DIAGNOSIS — F411 Generalized anxiety disorder: Secondary | ICD-10-CM

## 2012-12-06 DIAGNOSIS — E538 Deficiency of other specified B group vitamins: Secondary | ICD-10-CM

## 2012-12-06 DIAGNOSIS — R7989 Other specified abnormal findings of blood chemistry: Secondary | ICD-10-CM

## 2012-12-06 LAB — BASIC METABOLIC PANEL
BUN: 15 mg/dL (ref 6–23)
CO2: 26 mEq/L (ref 19–32)
Chloride: 95 mEq/L — ABNORMAL LOW (ref 96–112)
Glucose, Bld: 93 mg/dL (ref 70–99)
Potassium: 4.7 mEq/L (ref 3.5–5.1)

## 2012-12-06 MED ORDER — TIZANIDINE HCL 4 MG PO CAPS: 4.0000 mg | ORAL_CAPSULE | Freq: Three times a day (TID) | ORAL | Status: DC | PRN

## 2012-12-06 MED ORDER — FUROSEMIDE 20 MG PO TABS: 20.0000 mg | ORAL_TABLET | ORAL | Status: DC | PRN

## 2012-12-06 MED ORDER — CYANOCOBALAMIN 1000 MCG/ML IJ SOLN
1000.0000 ug | Freq: Once | INTRAMUSCULAR | Status: AC
  Administered 2012-12-06: 1000 ug via INTRAMUSCULAR

## 2012-12-06 MED ORDER — PERPHENAZINE 2 MG PO TABS: 2.0000 mg | ORAL_TABLET | Freq: Every day | ORAL | Status: DC

## 2012-12-06 MED ORDER — ALPRAZOLAM 1 MG PO TABS: 1.0000 mg | ORAL_TABLET | Freq: Two times a day (BID) | ORAL | Status: DC | PRN

## 2012-12-06 MED ORDER — SOLIFENACIN SUCCINATE 10 MG PO TABS: 10.0000 mg | ORAL_TABLET | ORAL | Status: DC | PRN

## 2012-12-06 MED ORDER — NORTRIPTYLINE HCL 25 MG PO CAPS: 25.0000 mg | ORAL_CAPSULE | Freq: Every day | ORAL | Status: DC

## 2012-12-06 MED ORDER — IBANDRONATE SODIUM 150 MG PO TABS: 150.0000 mg | ORAL_TABLET | ORAL | Status: DC

## 2012-12-06 NOTE — Assessment & Plan Note (Signed)
Significant symptoms - overlap with medical concerns Continue daily Xanax as has been on same chronically for years. Also Pamelor and Halicon for sleep, no change recommended  Refills provided

## 2012-12-06 NOTE — Patient Instructions (Signed)
It was good to see you today. We have reviewed your prior records including labs and tests today Labs ordered today. Your results will be released to MyChart (or called to you) after review, usually within 72hours after test completion. If any changes need to be made, you will be notified at that same time. we'll make referral to gynecology [place]) and neurology specialist . Our office will contact you regarding appointment(s) once made. Medications reviewed and updated, try Zanaflex for muscle spasm pain -no other changes at this time. Please schedule followup in 3 months, call sooner if problems.

## 2012-12-06 NOTE — Progress Notes (Signed)
Subjective:    Patient ID: Sarah [name], female    DOB: 15-Sep-1926, 79 y.o.   MRN: [id number]  Back Pain This is a chronic problem. The problem has been waxing and waning since onset. Pain location: right>left. The quality of the pain is described as cramping. The pain does not radiate. The pain is moderate. The pain is the same all the time. Stiffness is present all day. Associated symptoms include leg pain (BLE) and weakness (BLE). Pertinent negatives include no dysuria, numbness, pelvic pain, tingling or weight loss.   Also here for follow up - review chronic medical issues today and interval medical events:  Osteoporosis. Previously followed with rheumatology for same. Denies history fracture. No back pain or new bone pain. took weekly generic Fosamax for years, changed to [facility] in approximately 2010. Compliant with medications monthly and daily calcium plus vitamin D. Active weightbearing exercise as tolerated by arthritis, see next  Osteoarthritis. Diffuse including knees, low back and hands. Takes over-the-counter NSAIDs such as ibuprofen or Aleve on a scheduled basis daily. Occasional hydrocodone as needed for severe pain symptoms. Has worked with outpatient physical therapy. See ROS below re: back and shoulder  Bilateral feet edema. Ongoing since November 2012 chronically. Previously experienced edema only episodic related to beach travel and excessive salt consumption. In the past 3 months, chronic swelling of lower extremity and ankles/feet. Has been evaluated by cardiology for same. Slightly improved but not resolved with daily Lasix. Noncompliant with compression hose due to discomfort of fit.  Anxiety. Takes Xanax twice daily scheduled. Also associated with chronic insomnia for which she takes [employer]  Past Medical History  Diagnosis Date  . Osteoarthritis   . Bladder incontinence   . Constipation     hx of  . Hypertension   . Hyperlipidemia   . Chest pain    S/P nuclear 07/17/2008  normal; EF 88% with no ischemia  . Chronic venous insufficiency     BLE edema, chronic, neg venous US 2/13  . History of chicken pox   . Depression   . Diverticulitis   . Polyp of colon   . Osteoporosis, postmenopausal 06/02/2011  . Esophageal stricture     s/p dilation 04/2012  . GERD (gastroesophageal reflux disease)     Review of Systems  Constitutional: Negative for weight loss.  Genitourinary: Negative for dysuria and pelvic pain.  Musculoskeletal: Positive for back pain.  Neurological: Positive for weakness (BLE). Negative for tingling and numbness.       Objective:   Physical Exam BP 132/70  Pulse 92  Temp(Src) 98 F (36.7 C) (Oral)  Wt 159 lb 6.4 oz (72.303 kg)  BMI 28.24 kg/m2  SpO2 98% Wt Readings from Last 3 Encounters:  12/06/12 159 lb 6.4 oz (72.303 kg)  10/20/12 157 lb 12.8 oz (71.578 kg)  06/28/12 153 lb (69.4 kg)   Constitutional: She is elderly but spry and fit, appears well-developed and well-nourished. No distress.  Neck: Normal range of motion. Neck supple. No JVD present. No thyromegaly present.  Cardiovascular: Normal rate, regular rhythm and normal heart sounds.  No murmur heard. 1+ nonpitting BLE edema feet to below knee. Pulmonary/Chest: Effort normal and breath sounds normal. No respiratory distress. She has no wheezes.  Musculoskeletal: OA change B hands and knees -but normal range of motion, no joint effusions. No gross deformities. Back: full range of motion of thoracic and lumbar spine. Non tender to palpation. DTR's are symmetrically intact. Sensation intact in all dermatomes  of the lower extremities. Full strength to manual muscle testing. patient ambulates with slow, slightly antalgic (but unaided) gait. Neurological: She is alert and oriented to person, place, and time. No cranial nerve deficit. Coordination normal.  Psychiatric: She has a perseverating mood and affect. Her behavior is normal. Judgment and thought content  normal.   Lab Results  Component Value Date   WBC 5.4 06/28/2012   HGB 12.1 06/28/2012   HCT 36.1 06/28/2012   PLT 284.0 06/28/2012   GLUCOSE 93 06/28/2012   CHOL 193 06/28/2012   TRIG 60.0 06/28/2012   HDL 92.20 06/28/2012   LDLDIRECT 101.1 10/21/2010   LDLCALC 89 06/28/2012   ALT 13 06/28/2012   AST 9 06/28/2012   NA 131* 06/28/2012   K 4.0 06/28/2012   CL 93* 06/28/2012   CREATININE 0.7 06/28/2012   BUN 16 06/28/2012   CO2 31 06/28/2012   TSH 5.91* 06/28/2012   No results found for this basename: VITAMINB12      Assessment & Plan:   see problem list. Medications and labs reviewed today.  BLE weakness - ?neurogenic claudication seems most likely given DDD, and suspect pt NOT a candidate for surgical intervention - but she requests neuro eval and NCS to evaluate for the problem - refer done  Abnormal TSH - recheck now

## 2012-12-06 NOTE — Assessment & Plan Note (Signed)
Chronic symptoms - felt related to atrophic vaginitis on prior gyn eval Pt did not tolerate/start vaginal estrogen due to feared side effects  Requests follow up with gyn - will order same

## 2012-12-08 ENCOUNTER — Telehealth: Payer: Self-pay | Admitting: Cardiology

## 2012-12-08 ENCOUNTER — Telehealth: Payer: Self-pay | Admitting: *Deleted

## 2012-12-08 DIAGNOSIS — E871 Hypo-osmolality and hyponatremia: Secondary | ICD-10-CM

## 2012-12-08 NOTE — Telephone Encounter (Signed)
Discussed sodium results, she will increase her salt as advised

## 2012-12-08 NOTE — Telephone Encounter (Signed)
Cramping can be from low sodium Agree with resuming normal salt - 1 week normal salt not enough time for change Recheck lab only in 3 weeks (Oct 3) - order in EMR

## 2012-12-08 NOTE — Telephone Encounter (Signed)
Left msg on vm stating she has been using a low sodium salt that probably why her sodium level maybe low. Did buy some regular salt but will it be longer enough to have levels check if she come next week. Also fail to let md know that she been having some cramping in her feet & legs. Wanting md recommendations on that...Raechel Chute

## 2012-12-08 NOTE — Telephone Encounter (Signed)
Notified pt with md response.../lmb 

## 2012-12-08 NOTE — Telephone Encounter (Signed)
New Problem  Pt wants to know what her sodium levels were from her last visit.

## 2012-12-13 ENCOUNTER — Ambulatory Visit: Payer: Medicare Other | Admitting: Internal Medicine

## 2012-12-15 ENCOUNTER — Encounter: Payer: Self-pay | Admitting: Neurology

## 2012-12-15 ENCOUNTER — Ambulatory Visit (INDEPENDENT_AMBULATORY_CARE_PROVIDER_SITE_OTHER): Payer: Medicare Other | Admitting: Neurology

## 2012-12-15 VITALS — BP 138/70 | HR 98 | Temp 98.3°F | Resp 20 | Ht 62.75 in | Wt 159.0 lb

## 2012-12-15 DIAGNOSIS — R269 Unspecified abnormalities of gait and mobility: Secondary | ICD-10-CM | POA: Diagnosis not present

## 2012-12-15 DIAGNOSIS — R2681 Unsteadiness on feet: Secondary | ICD-10-CM | POA: Insufficient documentation

## 2012-12-15 DIAGNOSIS — G8929 Other chronic pain: Secondary | ICD-10-CM

## 2012-12-15 DIAGNOSIS — R209 Unspecified disturbances of skin sensation: Secondary | ICD-10-CM

## 2012-12-15 DIAGNOSIS — M549 Dorsalgia, unspecified: Secondary | ICD-10-CM

## 2012-12-15 MED ORDER — TRIAMCINOLONE ACETONIDE 40 MG/ML IJ SUSP
40.0000 mg | Freq: Once | INTRAMUSCULAR | Status: AC
  Administered 2012-12-15: 40 mg via INTRAMUSCULAR

## 2012-12-15 NOTE — Progress Notes (Signed)
[facility] Clinic Note - Initial Visit   Date: December 15, 2012   [name] MRN: [id number] DOB: Apr 24, 1926   Dear Dr Felicity Coyer:  Thank you for your kind referral of Sarah [name] for consultation of leg weakness. Although her history is well known to you, please allow Sarah [name] to reiterate it for the purpose of our medical record. The patient was accompanied to the clinic by self.   History of Present Illness: Sarah [name] is a 79 y.o. year-old right-handed Caucasian female with history of HTN, HPL, depression, and GERD presenting for evaluation of leg weakness.    Leg weakness started slowly around May 2014 when she noticed difficulty with walking and tripping on things.  She has a hard time describing her walking problems, but says that her legs feel weak.  She would stumble frequently, and denies any falls.  Denies any alleviating or exacerbating factors.  She continues to ambulate independently and is able to perform all her usual daily activities.   She has associated bilateral achy knee pain which feels tight and is worse when she applies pressure on it.  Her symptoms steadily worsened. She reports having chronic back pain but had worsening pain in August 2014.  Pain is achy located at the low back and does not radiate into her buttocks or legs.  Forward flexion makes her pain worse and it is improved by laying down.  She currently takes ibuprofen, flexiril, and applies icyhot.    Out-side paper records, electronic medical record, and images have been reviewed where available and summarized as:   XR lumbar spine 06/07/2012: Findings: There is curvature convex to the left. There is advanced degenerative disc disease at T12-L1, L1-2 and L4-5. The L5-S1 level is transitional. There is anterolisthesis of 3 mm of L4-5.  There is retrolisthesis of 3 mm at T12-L1 and L1-2. These degenerative changes have worsened since 2010. No evidence of fracture.  IMPRESSION:   Curvature convex to the left. Worsening of degenerative disc disease at T12-L1, L1-2 and L4-5. See above for complete discussion.  Labs:  TSH 9/10:  5.3   Past Medical History  Diagnosis Date  . Osteoarthritis   . Bladder incontinence   . Constipation     hx of  . Hypertension   . Hyperlipidemia   . Chest pain     S/P nuclear 07/17/2008  normal; EF 88% with no ischemia  . Chronic venous insufficiency     BLE edema, chronic, neg venous Sarah [name] 2/13  . History of chicken pox   . Depression   . Diverticulitis   . Polyp of colon   . Osteoporosis, postmenopausal 06/02/2011  . Esophageal stricture     s/p dilation 04/2012  . GERD (gastroesophageal reflux disease)     Past Surgical History  Procedure Laterality Date  . Appendectomy      age 25  . Tubal ligation    . Polypectomy    . Colonoscopy    . Upper gastrointestinal endoscopy       Medications:  Current Outpatient Prescriptions on File Prior to Visit  Medication Sig Dispense Refill  . Acetaminophen (TYLENOL ARTHRITIS PAIN PO) Take by mouth 2 (two) times daily.        Marland Kitchen ALPRAZolam (XANAX) 1 MG tablet Take 1 tablet (1 mg total) by mouth 2 (two) times daily as needed.  60 tablet  5  . aspirin 81 MG tablet Take 81 mg by mouth daily.      Marland Kitchen  Calcium Carbonate-Vit D-Min (CALCIUM 1200 PO) Take 1 tablet by mouth daily.      . Cholecalciferol (VITAMIN D PO) Take by mouth. Taking 2000 daily       . cyanocobalamin (,VITAMIN B-12,) 1000 MCG/ML injection Inject 1 mL (1,000 mcg total) into the muscle every 30 (thirty) days.  1 mL  3  . furosemide (LASIX) 20 MG tablet Take 1 tablet (20 mg total) by mouth as needed.  30 tablet  5  . ibandronate (BONIVA) 150 MG tablet Take 1 tablet (150 mg total) by mouth every 30 (thirty) days.  1 tablet  5  . ibuprofen (ADVIL,MOTRIN) 200 MG tablet Take 200 mg by mouth 2 (two) times daily before a meal.      . nortriptyline (PAMELOR) 25 MG capsule Take 1 capsule (25 mg total) by mouth at bedtime.  30 capsule   5  . Omega-3 Fatty Acids (FISH OIL) 1200 MG CAPS Take 1 capsule by mouth daily.      . pantoprazole (PROTONIX) 40 MG tablet Take 1 tablet (40 mg total) by mouth daily.  30 tablet  6  . perphenazine (TRILAFON) 2 MG tablet Take 1 tablet (2 mg total) by mouth at bedtime.  30 tablet  5  . polyethylene glycol (MIRALAX / GLYCOLAX) packet Take 17 g by mouth daily. As needed       . solifenacin (VESICARE) 10 MG tablet Take 1 tablet (10 mg total) by mouth as needed.  30 tablet  5  . tiZANidine (ZANAFLEX) 4 MG capsule Take 1 capsule (4 mg total) by mouth 3 (three) times daily as needed for muscle spasms.  60 capsule  0  . clobetasol ointment (TEMOVATE) 0.05 % Apply 1 application topically 2 (two) times daily.       . nitroGLYCERIN (NITROSTAT) 0.4 MG SL tablet Place 0.4 mg under the tongue every 5 (five) minutes as needed for chest pain.      Marland Kitchen triazolam (HALCION) 0.25 MG tablet Take 0.5-1 tablets (0.125-0.25 mg total) by mouth at bedtime as needed.  30 tablet  5  . [DISCONTINUED] solifenacin (VESICARE) 10 MG tablet Take 10 mg by mouth as needed.        No current facility-administered medications on file prior to visit.    Allergies:  Allergies  Allergen Reactions  . Celebrex [Celecoxib]     rash  . Lipitor [Atorvastatin Calcium]     Leg weakness,memory loss  . Lovastatin     Leg weakness  . Other     Always pads  . Latex Rash    Family History: Family History  Problem Relation Age of Onset  . Cancer Son     testicular cancer  . Colon cancer Neg Hx   . Esophageal cancer Neg Hx   . Rectal cancer Neg Hx   . Stomach cancer Neg Hx   . Breast cancer Sister   . Skin cancer Sister     Social History: History   Social History  . Marital Status: Widowed    Spouse Name: N/A    Number of Children: N/A  . Years of Education: N/A   Occupational History  . Not on file.   Social History Main Topics  . Smoking status: Never Smoker   . Smokeless tobacco: Never Used  . Alcohol Use: No   . Drug Use: No  . Sexual Activity: Not on file   Other Topics Concern  . Not on file   Social History Narrative  She lives alone in [facility] home, but stays on the first floor.    Review of Systems:  CONSTITUTIONAL: No fevers, chills, night sweats, or weight loss.  +fatigue EYES: No visual changes or eye pain ENT: No hearing changes.  No history of nose bleeds.   RESPIRATORY: No cough, wheezing and shortness of breath.   CARDIOVASCULAR: Negative for chest pain, and palpitations.   GI: Negative for abdominal discomfort, blood in stools or black stools.  No recent change in bowel habits.   GU:  +history of urinary incontinence.   MUSCLOSKELETAL: + joint pain or swelling.  No myalgias.   SKIN: Negative for lesions, rash, and itching.   HEMATOLOGY/ONCOLOGY: Negative for prolonged bleeding, bruising easily, and swollen nodes.   ENDOCRINE: Negative for cold or heat intolerance, polydipsia or goiter.   PSYCH:  No depression or anxiety symptoms.   NEURO: As Above.   Vital Signs:  BP 138/70  Pulse 98  Temp(Src) 98.3 F (36.8 C) (Oral)  Resp 20  Ht 5' 2.75" (1.594 m)  Wt 159 lb (72.122 kg)  BMI 28.39 kg/m2  .  Neurological Exam: MENTAL STATUS including orientation to time, place, person, recent and remote memory, attention span and concentration, language, and fund of knowledge is normal.  Speech is not dysarthric.  CRANIAL NERVES: II:  No visual field defects.  Unremarkable fundi.   III-IV-VI: Pupils equal round and reactive to light.  Normal conjugate, extra-ocular eye movements in all directions of gaze.  No nystagmus.  No ptosis. V:  Normal facial sensation.   VII:  Normal facial symmetry and movements.   VIII:  Normal hearing and vestibular function.   IX-X:  Normal palatal movement.   XI:  Normal shoulder shrug and head rotation.   XII:  Normal tongue strength and range of motion, no deviation or fasciculation.  MOTOR:  No atrophy, fasciculations or abnormal  movements.  No pronator drift.  Tone is normal.    Right Upper Extremity:    Left Upper Extremity:    Deltoid  5/5   Deltoid  5/5   Biceps  5/5   Biceps  5/5   Triceps  5/5   Triceps  5/5   Wrist extensors  5/5   Wrist extensors  5/5   Wrist flexors  5/5   Wrist flexors  5/5   Finger extensors  5/5   Finger extensors  5/5   Finger flexors  5/5   Finger flexors  5/5   Dorsal interossei  4+/5   Dorsal interossei  4+/5   Abductor pollicis  5/5   Abductor pollicis  5/5   Tone (Ashworth scale)  0  Tone (Ashworth scale)  0   Right Lower Extremity:    Left Lower Extremity:    Hip flexors  5/5   Hip flexors  5/5   Hip extensors  5/5   Hip extensors  5/5   Knee flexors  5/5   Knee flexors  5/5   Knee extensors  5/5   Knee extensors  5/5   Dorsiflexors  5/5   Dorsiflexors  5/5   Plantarflexors  5/5   Plantarflexors  5/5   Toe extensors  5-/5   Toe extensors  5-/5   Toe flexors  5/5   Toe flexors  5/5   Tone (Ashworth scale)  0  Tone (Ashworth scale)  0   MSRs:  Right  Left brachioradialis 2+  brachioradialis 2+  biceps 2+  biceps 2+  triceps 2+  triceps 2+  patellar 2+  patellar 1+  ankle jerk 0  ankle jerk 0  Hoffman no  Hoffman no  plantar response down  plantar response down    SENSORY:  Absent vibration distal to ankles bilaterally.  Light touch, pinprick, and proprioception is intact throughout.  Romberg's sign present.   COORDINATION/GAIT: Normal finger-to- nose-finger and heel-to-shin.  Intact rapid alternating movements bilaterally.  Unable to rise from a chair without using arms.  Stooped posture with mildly wide-based gait, but appears stable.    IMPRESSION: Ms. Engelmann is a 79 year-old female with chronic back pain who presents with leg weakness.  On exam, her motor strength is full and intact and there is no signs of muscle atrophy.  Her left patella jerk and bilateral ankle jerks are depressed and there is  reduced vibratory sensation distal to ankles bilaterally.  Based her history and exam, she has a mild large fiber distal predominant polyneuropathy.  Her gait abnormalities and subjective sense of weakness is most likely multifactorial and related to chronic back pain, lumbar degenerative arthritis which may be causing nerve root impingement (?left L4 given reduced patella jerk), ?knee arthritis, and neuropathy.  She had know history of B12 deficiency and is currently taking daily oral and monthly IM supplements.  To better characterize the nature and severity of her symptoms, I will order an EMG of the left lower extremity.  I will also check labs for reversible causes of neuropathy. No evidence of a myopathic process.     PLAN/RECOMMENDATIONS:  1.  Check the following labs: Marland Kitchen Vitamin B12  . Methylmalonic acid, serum  . Ceruloplasmin  . Copper, serum  . IFE, Urine (with Tot Prot)  . Protein electrophoresis, serum  . Immunofixation electrophoresis  2.  EMG 3.  Out-patient physical therapy for gait training 4.  Fall precautions discussed 5.  Return to clinic in 63-month.  The duration of this appointment visit was 60 minutes of face-to-face time with the patient.  Greater than 50% of this time was spent in counseling, explanation of diagnosis, planning of further management, and coordination of care.   Thank you for allowing me to participate in patient's care.  If I can answer any additional questions, I would be pleased to do so.    Sincerely,    Maite Burlison K. Allena Katz, DO

## 2012-12-15 NOTE — Patient Instructions (Addendum)
1.  Bloodwork today 2.  EMG 3.  Out-patient physical therapy for gait training 4.  Fall precautions discussed 5.  Return to clinic in 63-month.

## 2012-12-16 LAB — CERULOPLASMIN: Ceruloplasmin: 29 mg/dL (ref 20–60)

## 2012-12-19 LAB — UIFE/LIGHT CHAINS/TP QN, 24-HR UR
Free Kappa/Lambda Ratio: 23 ratio — ABNORMAL HIGH (ref 2.04–10.37)
Free Lambda Lt Chains,Ur: 0.03 mg/dL (ref 0.02–0.67)
Total Protein, Urine: 1.2 mg/dL

## 2012-12-19 LAB — PROTEIN ELECTROPHORESIS, SERUM: Gamma Globulin: 11 % — ABNORMAL LOW (ref 11.1–18.8)

## 2012-12-19 LAB — IMMUNOFIXATION ELECTROPHORESIS: Total Protein, Serum Electrophoresis: 6.8 g/dL (ref 6.0–8.3)

## 2012-12-25 ENCOUNTER — Encounter: Payer: Medicare Other | Admitting: Neurology

## 2012-12-26 ENCOUNTER — Telehealth: Payer: Self-pay

## 2012-12-26 ENCOUNTER — Telehealth: Payer: Self-pay | Admitting: Cardiology

## 2012-12-26 NOTE — Telephone Encounter (Signed)
See previous 12/26/12 note. 

## 2012-12-26 NOTE — Telephone Encounter (Signed)
Returned call to patient she stated she had recent  lab work with Dr.Donika Allena Katz.Patient stated she wanted [name] to obtain lab work, that she is scheduled to have lab work in 11/14 and she didn't want to have done if not needed.Dr.Patel's lab in chart.Patient was told will need fasting bmet,lipid,hepatic.Patient stated she wanted to reschedule lab appointment and appointment with Dr.Brackbill.Appointments rescheduled to 03/13/13 11:30 lab and 11:45 with Dr.Brackbill.

## 2012-12-26 NOTE — Telephone Encounter (Signed)
New Problem  Pt wants to know if we can get the lab work from Dr. Eliane Decree office because she does not want to double up on lab work if its not needed.

## 2013-01-02 ENCOUNTER — Ambulatory Visit: Payer: Medicare Other | Admitting: Neurology

## 2013-01-09 ENCOUNTER — Encounter: Payer: Self-pay | Admitting: Neurology

## 2013-01-11 ENCOUNTER — Other Ambulatory Visit: Payer: Self-pay | Admitting: *Deleted

## 2013-01-11 MED ORDER — TRIAZOLAM 0.25 MG PO TABS: 0.1250 mg | ORAL_TABLET | Freq: Every evening | ORAL | Status: DC | PRN

## 2013-01-11 NOTE — Telephone Encounter (Signed)
Faxed script back to [facility].../lmb 

## 2013-01-15 ENCOUNTER — Telehealth: Payer: Self-pay | Admitting: Neurology

## 2013-01-15 ENCOUNTER — Ambulatory Visit (INDEPENDENT_AMBULATORY_CARE_PROVIDER_SITE_OTHER): Payer: Medicare Other | Admitting: Neurology

## 2013-01-15 ENCOUNTER — Encounter: Payer: Self-pay | Admitting: Neurology

## 2013-01-15 DIAGNOSIS — R269 Unspecified abnormalities of gait and mobility: Secondary | ICD-10-CM | POA: Diagnosis not present

## 2013-01-15 DIAGNOSIS — R29898 Other symptoms and signs involving the musculoskeletal system: Secondary | ICD-10-CM

## 2013-01-15 NOTE — Telephone Encounter (Signed)
That should be fine.    Donika K. Patel, DO   

## 2013-01-15 NOTE — Progress Notes (Signed)
See procedure note for EMG results.  Donika K. Patel, DO  

## 2013-01-15 NOTE — Telephone Encounter (Signed)
Pt coming back in tomorrow at 300pm for follow up. She wants EMG/NCV results ASAP. She asked that I double check with you to make sure this isn't too soon. Please let me know and I will call the patient. Thanks! Sherri

## 2013-01-15 NOTE — Procedures (Signed)
[facility]rology  [address] [place], [address]  [place], [place] [address] Tel: [phone] Fax:  [phone] Test Date:  01/15/2013  Patient: Sarah [name] DOB: [birth year] Physician: Nita Sickle, DO  Sex: Female Height: 5\' 3"  Ref Phys:   ID#: [id number] Temp: 32.2C Technician:    Patient Complaints: 79 year-old female with back pain and leg weakness.  NCV & EMG Findings: Extensive evaluation of the left lower extremity and additional studies of the right reveals:  1.  2. Tibial and peroneal motor responses are absent at the abductor hallucis and extensor digitorum brevis, respectively. Peroneal motor response is preserved at the tibialis anterior. 3. Absent sural and superficial peroneal sensory responses on the left. Although these findings may be normal for patient's age, given that motor responses absent in the foot and flexor digitorum longus shows chronic motor axon changes, they are most likely consistent with a sensorimotor polyneuropathy. 4. Chronic motor axon loss changes affecting the vastus lateralis and abductor longus bilaterally, without active denervation.  Impression: Taken together, these findings are consistent with a moderate generalized, large fiber, sensorimotor polyneuropathy affecting the left side. There is a superimposed intraspinal canal lesion (i.e. radiculopathy) affecting the L2-L4 nerve root/segments bilaterally, moderate in degree electrically.   ___________________________ Nita Sickle, DO    Nerve Conduction Studies Anti Sensory Summary Table   Site NR Peak (ms) Norm Peak (ms) P-T Amp (V) Norm P-T Amp  Left Sup Peroneal Anti Sensory (Ant Lat Mall)  12 cm NR  <4.6  >3  Left Sural Anti Sensory (Lat Mall)  Calf NR  <4.6  >3   Motor Summary Table   Site NR Onset (ms) Norm Onset (ms) O-P Amp (mV) Norm O-P Amp Site1 Site2 Delta-0 (ms) Dist (cm) Vel (m/s) Norm Vel (m/s)  Left Peroneal Motor (Ext Dig Brev)  Ankle NR  <6.0  >2.5 B Fib Ankle   33.0  >40  B Fib NR     Poplt B Fib  10.0  >40  Poplt NR            Left Peroneal TA Motor (Tib Ant)  Fib Head    2.6 <4.5 3.0 >3 Poplit Fib Head 1.9 8.0 42 >40  Poplit    4.5  2.9         Left Tibial Motor (Abd Hall Brev)  Ankle NR  <6.0  >4 Knee Ankle  37.0  >40  Knee NR             H Reflex Studies   NR H-Lat (ms) Lat Norm (ms) L-R H-Lat (ms)  Left Tibial (Gastroc)  NR  <35   Right Tibial (Gastroc)  NR  <35    EMG   Side Muscle Ins Act Fibs Psw Fasc Number Recrt Dur Dur. Amp Amp. Poly Poly. Comment  Left AntTibialis Nml Nml Nml Nml Nml Nml Nml Nml Nml Nml Nml Nml N/A  Left Gastroc Nml Nml Nml Nml 1- Mod-V Nml Nml Nml Nml Nml Nml N/A  Left Flex Dig Long Nml Nml Nml Nml 2- Mod-R Some 1+ Some 1+ Nml Nml N/A  Left VastusLat Nml Nml Nml Nml 2- Mod-R Many 1+ Many 1+ Nml Nml N/A  Left AdductorLong Nml Nml Nml Nml 2- Mod-R Some 1+ Some 1+ Some 1+ N/A  Right AdductorLong Nml Nml Nml Nml 1- Mod Few 1+ Nml Nml Nml Nml N/A  Right VastusLat Nml Nml Nml Nml 2- Mod-R Some 1+ Some 1+ Nml Nml N/A  Right Flex  Dig Long Nml Nml Nml Nml 1- Mod-R Some 1+ Some 1+ Nml Nml N/A  Right Gastroc Nml Nml Nml Nml 1- Mod-V Nml Nml Nml Nml Nml Nml N/A  Left TensorFascLat Nml Nml Nml Nml 1- Mod Nml Nml Nml Nml Nml Nml N/A  Right AntTibialis Nml Nml Nml Nml Nml Nml Few 1+ Nml Nml Nml Nml N/A      Waveforms:

## 2013-01-16 ENCOUNTER — Encounter: Payer: Self-pay | Admitting: Neurology

## 2013-01-16 ENCOUNTER — Ambulatory Visit (INDEPENDENT_AMBULATORY_CARE_PROVIDER_SITE_OTHER): Payer: Medicare Other | Admitting: Neurology

## 2013-01-16 VITALS — BP 120/60 | HR 60 | Temp 98.1°F | Resp 14 | Ht 60.5 in | Wt 158.3 lb

## 2013-01-16 DIAGNOSIS — G608 Other hereditary and idiopathic neuropathies: Secondary | ICD-10-CM

## 2013-01-16 DIAGNOSIS — M47817 Spondylosis without myelopathy or radiculopathy, lumbosacral region: Secondary | ICD-10-CM

## 2013-01-16 DIAGNOSIS — R269 Unspecified abnormalities of gait and mobility: Secondary | ICD-10-CM

## 2013-01-16 LAB — BASIC METABOLIC PANEL WITH GFR
BUN: 16 mg/dL (ref 6–23)
CO2: 26 mEq/L (ref 19–32)
Chloride: 98 mEq/L (ref 96–112)
GFR, Est African American: 82 mL/min
Glucose, Bld: 95 mg/dL (ref 70–99)
Potassium: 5.2 mEq/L (ref 3.5–5.3)
Sodium: 133 mEq/L — ABNORMAL LOW (ref 135–145)

## 2013-01-16 NOTE — Progress Notes (Addendum)
[facility] Neurology Division  Follow-up Visit   Date: 01/16/2013   Sarah [name] MRN: [id number] DOB: 07-20-1926   Interim History: Sarah [name] is a 79 y.o. year-old right-handed Caucasian female with history of HTN, HPL, depression, and GERD returning to the clinic for follow-up of leg weaknes.  The patient was accompanied to the clinic by self.  She was last seen in the office on 12/15/2012.   A few weeks ago, her sister unexpectedly passed away so she has had to reschedule several of her appointments.  Overall, she is doing well.  She continues to feel weak in her legs, but she has no had any falls and continues to walk independently.  At her last visit, I checked neuropathy labs which all returned normal.  EMG was performed yesterday and shows moderate peripheral neuropathy of the legs and L3-L4 old radiculopathy (moderate). She denies any numbness/tingling, but does complain feeling unsteady especially on uneven surfaces.  History of present illness: Leg weakness started slowly around May 2014 when she noticed difficulty with walking and tripping on things. She has a hard time describing her walking problems, but says that her legs feel weak. She would stumble frequently, and denies any falls. Denies any alleviating or exacerbating factors. She continues to ambulate independently and is able to perform all her usual daily activities. She has associated bilateral achy knee pain which feels tight and is worse when she applies pressure on it. Her symptoms steadily worsened. She reports having chronic back pain but had worsening pain in August 2014. Pain is achy located at the low back and does not radiate into her buttocks or legs. Forward flexion makes her pain worse and it is improved by laying down. She currently takes ibuprofen, flexiril, and applies icyhot.     Medications:  Current Outpatient Prescriptions on File Prior to Visit  Medication Sig Dispense Refill  .  Acetaminophen (TYLENOL ARTHRITIS PAIN PO) Take by mouth 2 (two) times daily.        Marland Kitchen ALPRAZolam (XANAX) 1 MG tablet Take 1 tablet (1 mg total) by mouth 2 (two) times daily as needed.  60 tablet  5  . aspirin 81 MG tablet Take 81 mg by mouth daily.      . Calcium Carbonate-Vit D-Min (CALCIUM 1200 PO) Take 1 tablet by mouth daily.      . Cholecalciferol (VITAMIN D PO) Take by mouth. Taking 2000 daily       . clobetasol ointment (TEMOVATE) 0.05 % Apply 1 application topically 2 (two) times daily.       . cyanocobalamin (,VITAMIN B-12,) 1000 MCG/ML injection Inject 1 mL (1,000 mcg total) into the muscle every 30 (thirty) days.  1 mL  3  . furosemide (LASIX) 20 MG tablet Take 1 tablet (20 mg total) by mouth as needed.  30 tablet  5  . ibandronate (BONIVA) 150 MG tablet Take 1 tablet (150 mg total) by mouth every 30 (thirty) days.  1 tablet  5  . ibuprofen (ADVIL,MOTRIN) 200 MG tablet Take 200 mg by mouth 2 (two) times daily before a meal.      . nitroGLYCERIN (NITROSTAT) 0.4 MG SL tablet Place 0.4 mg under the tongue every 5 (five) minutes as needed for chest pain.      . nortriptyline (PAMELOR) 25 MG capsule Take 1 capsule (25 mg total) by mouth at bedtime.  30 capsule  5  . Omega-3 Fatty Acids (FISH OIL) 1200 MG CAPS Take 1 capsule by  mouth daily.      . pantoprazole (PROTONIX) 40 MG tablet Take 1 tablet (40 mg total) by mouth daily.  30 tablet  6  . perphenazine (TRILAFON) 2 MG tablet Take 1 tablet (2 mg total) by mouth at bedtime.  30 tablet  5  . polyethylene glycol (MIRALAX / GLYCOLAX) packet Take 17 g by mouth daily. As needed       . solifenacin (VESICARE) 10 MG tablet Take 1 tablet (10 mg total) by mouth as needed.  30 tablet  5  . tiZANidine (ZANAFLEX) 4 MG capsule Take 1 capsule (4 mg total) by mouth 3 (three) times daily as needed for muscle spasms.  60 capsule  0  . triazolam (HALCION) 0.25 MG tablet Take 0.5-1 tablets (0.125-0.25 mg total) by mouth at bedtime as needed.  30 tablet  5  .  [DISCONTINUED] solifenacin (VESICARE) 10 MG tablet Take 10 mg by mouth as needed.        No current facility-administered medications on file prior to visit.    Allergies:  Allergies  Allergen Reactions  . Celebrex [Celecoxib]     rash  . Lipitor [Atorvastatin Calcium]     Leg weakness,memory loss  . Lovastatin     Leg weakness  . Other     Always pads  . Latex Rash     Review of Systems:  CONSTITUTIONAL: No fevers, chills, night sweats, or weight loss.   EYES: No visual changes or eye pain ENT: No hearing changes.  No history of nose bleeds.   RESPIRATORY: No cough, wheezing and shortness of breath.   CARDIOVASCULAR: Negative for chest pain, and palpitations.   GI: Negative for abdominal discomfort, blood in stools or black stools.  No recent change in bowel habits.   GU:  No history of incontinence.   MUSCLOSKELETAL: No history of joint pain or swelling.  No myalgias.  + back pain SKIN: Negative for lesions, rash, and itching.   HEMATOLOGY/ONCOLOGY: Negative for prolonged bleeding, bruising easily, and swollen nodes.   ENDOCRINE: Negative for cold or heat intolerance, polydipsia or goiter.   PSYCH:  No depression or anxiety symptoms.   NEURO: As Above.   Vital Signs:  BP 120/60  Pulse 60  Temp(Src) 98.1 F (36.7 C)  Resp 14  Ht 5' 0.5" (1.537 m)  Wt 158 lb 4.8 oz (71.804 kg)  BMI 30.39 kg/m2   Neurological Exam: MENTAL STATUS including orientation to time, place, person, recent and remote memory, attention span and concentration, language, and fund of knowledge is normal.  Speech is not dysarthric.  CRANIAL NERVES: II:  No visual field defects.   III-IV-VI: Pupils equal round and reactive to light.  Normal conjugate, extra-ocular eye movements in all directions of gaze.  No nystagmus.   V:  Normal facial sensation.  VII:  Normal facial symmetry and movements.  VIII:  Normal hearing and vestibular function.   IX-X:  Normal palatal movement.   XI:  Normal  shoulder shrug and head rotation.   XII:  Normal tongue strength and range of motion, no deviation or fasciculation.  MOTOR:  No atrophy, fasciculations or abnormal movements.  No pronator drift.    Right Upper Extremity:    Left Upper Extremity:    Deltoid  5/5   Deltoid  5/5   Biceps  5/5   Biceps  5/5   Triceps  5/5   Triceps  5/5   Wrist extensors  5/5   Wrist extensors  5/5  Wrist flexors  5/5   Wrist flexors  5/5   Finger extensors  5/5   Finger extensors  5/5   Finger flexors  5/5   Finger flexors  5/5   Dorsal interossei  5/5   Dorsal interossei  5/5   Abductor pollicis  5/5   Abductor pollicis  5/5   Tone (Ashworth scale)  0  Tone (Ashworth scale)  0   Right Lower Extremity:    Left Lower Extremity:    Hip flexors  5/5   Hip flexors  5/5   Hip extensors  5/5   Hip extensors  5/5   Knee flexors  5/5   Knee flexors  5/5   Knee extensors  5/5   Knee extensors  5/5   Dorsiflexors  5/5   Dorsiflexors  5/5   Plantarflexors  5/5   Plantarflexors  5/5   Toe extensors  5-/5   Toe extensors  5-/5   Toe flexors  5/5   Toe flexors  5/5   Tone (Ashworth scale)  0  Tone (Ashworth scale)  0   MSRs:  Right                                                                 Left brachioradialis 2+  brachioradialis 2+  biceps 2+  biceps 2+  triceps 2+  triceps 2+  patellar 1+  patellar 1+  ankle jerk 0  ankle jerk 0  Hoffman no  Hoffman no  plantar response down  plantar response down    SENSORY:  Absent vibration distal to ankles bilaterally.  Light touch intact.   Romberg's sign present.    COORDINATION/GAIT:  Intact rapid alternating movements bilaterally.  Able to rise from a chair without using arms.  Stooped posture, small steps, gait narrow based and stable.   Data: XR lumbar spine 06/07/2012: Findings: There is curvature convex to the left. There is advanced degenerative disc disease at T12-L1, L1-2 and L4-5. The L5-S1 level is transitional. There is anterolisthesis of 3 mm of  L4-5.  There is retrolisthesis of 3 mm at T12-L1 and L1-2. These degenerative changes have worsened since 2010. No evidence of fracture.  IMPRESSION:  Curvature convex to the left. Worsening of degenerative disc disease at T12-L1, L1-2 and L4-5. See above for complete discussion.  Component     Latest Ref Rng 12/06/2012 12/15/2012  TSH     0.35 - 5.50 uIU/mL 5.43   Vitamin B-12     211 - 911 pg/mL  426  Methylmalonic Acid, Quant     <0.40 umol/L  0.22  Ceruloplasmin     20 - 60 mg/dL  29  Copper     70 - 478 mcg/dL  295  SPEP/UPEP with IFE - no monoclonal gammopathy  EMG 01/15/2013:  Taken together, these findings are consistent with a moderate generalized, large fiber, sensorimotor polyneuropathy affecting the left side. There is a superimposed intraspinal canal lesion (i.e. radiculopathy) affecting the L2-L4 nerve root/segments bilaterally, moderate in degree electrically.   IMPRESSION/PLAN: 1.  Distal predominant large fiber sensorimotor polyneuropathy, moderate  - Labs do not reveal underlying etiology, which is not surprising given that >50% of PN cases are idiopathic  - Since she has no dysesthesias, no symptomatic  treatment can be offered.  PT offered, but patient deferred at this time  - Will check HbA1c today 2.  Gait difficulty  - Multifactorial and related to peripheral neuropathy as well as radiculopathy (L2-L4) which may be causing her proximal leg weakness.    - Discussed physical therapy for gait training, but patient would like to consider this after the holiday season   - Fall precautions discussed 3.  Hyponatremia  - Being worked-up by Dr. Felicity Coyer  - She missed her lab check-up last week and as we are checking HbA1c, will also get BMP 4.  Return to clinic in 2-3 months, or sooner as needed  The duration of this appointment visit was 45 minutes of face-to-face time with the patient.  Greater than 50% of this time was spent in counseling, explanation of diagnosis,  planning of further management, and coordination of care.   Thank you for allowing me to participate in patient's care.  If I can answer any additional questions, I would be pleased to do so.    Sincerely,    Donika K. Patel, DO   Addendum No evidence of diabetes, HbA1c 5.9 Sodium remains slightly low (133), will forward information to Dr. Raynelle Highland to discuss results with patient, but there was no answer.  Message left on answering machine.  Donika K. Allena Katz, DO 01/17/2013

## 2013-01-19 ENCOUNTER — Ambulatory Visit: Payer: Medicare Other | Admitting: Neurology

## 2013-02-02 DIAGNOSIS — L820 Inflamed seborrheic keratosis: Secondary | ICD-10-CM | POA: Diagnosis not present

## 2013-02-11 ENCOUNTER — Other Ambulatory Visit: Payer: Self-pay | Admitting: Internal Medicine

## 2013-02-19 ENCOUNTER — Other Ambulatory Visit: Payer: Medicare Other

## 2013-02-19 ENCOUNTER — Telehealth: Payer: Self-pay | Admitting: *Deleted

## 2013-02-19 ENCOUNTER — Ambulatory Visit: Payer: Medicare Other | Admitting: Cardiology

## 2013-02-19 NOTE — Telephone Encounter (Signed)
Left msg on vm stating her back is worst more on the (R) side. MD gave her some muscle spasm to take not helping. Wanting md recommendations on what else she can do...Raechel Chute

## 2013-02-19 NOTE — Telephone Encounter (Signed)
Notified pt with md response. Pt states she been taking the muscles spasm medicine feels a lot better. Whrn she return back from out of town will call next week if need to see Dr. Katrinka Blazing...Raechel Chute

## 2013-02-19 NOTE — Telephone Encounter (Signed)
Please schedule OV with sports med to eval and tx back pain

## 2013-02-26 ENCOUNTER — Other Ambulatory Visit: Payer: Self-pay | Admitting: Internal Medicine

## 2013-02-26 ENCOUNTER — Telehealth: Payer: Self-pay | Admitting: Internal Medicine

## 2013-02-26 DIAGNOSIS — M47817 Spondylosis without myelopathy or radiculopathy, lumbosacral region: Secondary | ICD-10-CM

## 2013-02-26 NOTE — Telephone Encounter (Signed)
Pt called to say her back is worse.  She says a referral was discussed.  She wants to be referred.

## 2013-02-26 NOTE — Telephone Encounter (Signed)
done

## 2013-02-27 NOTE — Telephone Encounter (Signed)
Pt is aware.  

## 2013-03-01 ENCOUNTER — Encounter: Payer: Self-pay | Admitting: Family Medicine

## 2013-03-01 ENCOUNTER — Other Ambulatory Visit (INDEPENDENT_AMBULATORY_CARE_PROVIDER_SITE_OTHER): Payer: Medicare Other

## 2013-03-01 ENCOUNTER — Ambulatory Visit (INDEPENDENT_AMBULATORY_CARE_PROVIDER_SITE_OTHER): Payer: Medicare Other | Admitting: Family Medicine

## 2013-03-01 VITALS — BP 150/74 | HR 100

## 2013-03-01 DIAGNOSIS — M47817 Spondylosis without myelopathy or radiculopathy, lumbosacral region: Secondary | ICD-10-CM

## 2013-03-01 MED ORDER — FISH OIL 1000 MG PO CAPS: 2.0000 | ORAL_CAPSULE | Freq: Every day | ORAL | Status: DC

## 2013-03-01 MED ORDER — ACETAMINOPHEN 500 MG PO TABS: 500.0000 mg | ORAL_TABLET | Freq: Three times a day (TID) | ORAL | Status: DC | PRN

## 2013-03-01 MED ORDER — TUMERSAID PO TABS: 1.0000 | ORAL_TABLET | Freq: Two times a day (BID) | ORAL | Status: DC

## 2013-03-01 MED ORDER — FERROUS SULFATE 325 (65 FE) MG PO TBEC: 325.0000 mg | DELAYED_RELEASE_TABLET | Freq: Every day | ORAL | Status: DC

## 2013-03-01 NOTE — Assessment & Plan Note (Addendum)
Patient has significant degenerative disc disease at multiple levels as well as some mild slipping of L4-L5. The patient is having some polyneuropathy which could be secondary to this back findings as well. Patient though is having more of a sacroiliac joint dysfunction today and did have injection with some mild improvement in pain. I think this is going to be multifactorial with her degenerative disc disease, advanced osteophytic changes, as well as poor conditioning. Encourage patient to go to formal physical therapy a regular basis. We will do one year [employer]. In addition discussed other over-the-counter medications that could be beneficial. We did send these into her pharmacy today because patient states that she would have this delivered to her. Patient given some range of motion exercises she can start at home as well. We discussed protein supplementation to to try to help with strengthening and continue muscle endurance and strength. Patient will followup again as stated in patient instructions. We will avoid any further imaging at this time.

## 2013-03-01 NOTE — Progress Notes (Signed)
Pre-visit discussion using our clinic review tool. No additional management support is needed unless otherwise documented below in the visit note.  

## 2013-03-01 NOTE — Patient Instructions (Addendum)
Very nice to meet you Continue nortriptyline  Vitamin D is 2000IU daily is great! Glucosamine 750mg  2 times daily.  Fish oil 2 grams daily.  Increase tylenol to 500mg  3 times daily.  Consider tumeric 500mg  twice daily.  Capsacin topically 2 times a day Ensure 45 minutes before meals. This will help get more protein.  Shoe inserts could be helpful- Spenco orthotics at [employer] or [employer]. They are about 30 dollars.  I would lik eyou to go to physical therapy, they will be calling you. This will help the most Try some of the exercises at home.  I hope the injection helps Come back again after the holidays.

## 2013-03-01 NOTE — Progress Notes (Signed)
CC: Back pain  HPI: Patient is a very pleasant 79 year old female coming in with chronic back pain. Patient has a past medical history significant for osteoarthritis, osteoporosis, and a peripheral neuropathy been worked up by Dr. Allena Katz of neurology. Patient was having difficulty with angulation and was cared of having falls. Patient states that she was having weakness in his left leg and does have some imaging showing an L2-L4 radiculopathy confirmed on EMG. Patient describes her back pain mostly right-sided endpoints over the right SI joint. Patient states that this is a constant pain that seems to hurt with moving or without moving. Patient states he can keep her up at night. Patient describes the pain as 10 out of 10 with some radiculopathy down the right leg. Patient has not done any physical therapy for greater than 3 years. The patient is taking nortriptyline at night but has not noticed much improvement. Patient denies any bowel or bladder incontinence have been normal. Patient states her legs seem to be tingling at night when she lays down and does not know if this is associated or not.   Past medical, surgical, family and social history reviewed. Medications reviewed all in the electronic medical record.   Review of Systems: No headache, visual changes, nausea, vomiting, diarrhea, constipation, dizziness, abdominal pain, skin rash, fevers, chills, night sweats, weight loss, swollen lymph nodes, body aches, joint swelling, muscle aches, chest pain, shortness of breath, mood changes.   Objective:    Blood pressure 150/74, pulse 100, SpO2 97.00%.   General: No apparent distress alert and oriented x3 mood and affect normal, dressed appropriately.  HEENT: Pupils equal, extraocular movements intact Respiratory: Patient's speak in full sentences and does not appear short of breath Cardiovascular: No lower extremity edema, non tender, no erythema Skin: Warm dry intact with no signs of infection  or rash on extremities or on axial skeleton. Abdomen: Soft nontender Neuro: Cranial nerves II through XII are intact, neurovascularly intact in all extremities with 2+ DTRs and 2+ pulses. Lymph: No lymphadenopathy of posterior or anterior cervical chain or axillae bilaterally.  Gait walks with a cane mild broad-based gait MSK: Non tender with full range of motion and good stability and symmetric strength and tone of shoulders, elbows, wrist, hip, knee and ankles bilaterally.  Back Exam:  Inspection:increase kyphosis Motion: Flexion 25 deg, Extension 25 deg, Side Bending to 25 deg bilaterally,  Rotation to 25 deg bilaterally  SLR laying: pain but no radiation  XSLR laying: Negative  Palpable tenderness: right SI joint. FABER: unable to do per patient. Sensory change: Gross sensation intact to all lumbar and sacral dermatomes.  Reflexes: 2+ at both patellar tendons, 2+ at achilles tendons, Babinski's downgoing.  Strength at foot  Plantar-flexion: 5/5 Dorsi-flexion: 5/5 Eversion: 5/5 Inversion: 5/5  Leg strength  Quad: 4/5 Hamstring: 4/5 Hip flexor: 4/5 Hip abductors: 4/5  But symmetric to contralateral side.   Data:  XR lumbar spine 06/07/2012: Findings: There is curvature convex to the left. There is advanced degenerative disc disease at T12-L1, L1-2 and L4-5. The L5-S1 level is transitional. There is anterolisthesis of 3 mm of L4-5.  There is retrolisthesis of 3 mm at T12-L1 and L1-2. These degenerative changes have worsened since 2010. No evidence of fracture.  IMPRESSION:  Curvature convex to the left. Worsening of degenerative disc disease at T12-L1, L1-2 and L4-5. See above for complete discussion.   After verbal consent patient was prepped with alcohol swabs and with a 23-gauge 1-1/2 inch needle patient  was injected with 1 cc of Kenalog 0.5% Marcaine and 1 cc of Kenalog 40 mg/dL into the right SI joint under ultrasound guidance. Patient did tolerate the procedure well. Pictures were  saved. Patient did have minimal bleeding. Placed Band-Aid over injection site. Patient does have some mild improvement in pain but then started having pain in other areas of the low back.   Impression and Recommendations:     This case required medical decision making of moderate complexity.

## 2013-03-02 ENCOUNTER — Telehealth: Payer: Self-pay | Admitting: Family Medicine

## 2013-03-02 NOTE — Telephone Encounter (Signed)
Pt called to say the injection is not working.  She has taken tylenol 500mg  3 times a day.  That isn't helping much.  What else could she do.

## 2013-03-02 NOTE — Telephone Encounter (Signed)
Discussed with pt

## 2013-03-02 NOTE — Telephone Encounter (Signed)
The steroid may take a little longer to kick in.  Have her take the other medications on the list, fish oil, tumeric, etc.

## 2013-03-07 ENCOUNTER — Ambulatory Visit (INDEPENDENT_AMBULATORY_CARE_PROVIDER_SITE_OTHER): Payer: Medicare Other | Admitting: Internal Medicine

## 2013-03-07 ENCOUNTER — Encounter: Payer: Self-pay | Admitting: Internal Medicine

## 2013-03-07 VITALS — BP 130/72 | HR 82 | Temp 98.6°F | Wt 153.0 lb

## 2013-03-07 DIAGNOSIS — E7800 Pure hypercholesterolemia, unspecified: Secondary | ICD-10-CM

## 2013-03-07 DIAGNOSIS — F411 Generalized anxiety disorder: Secondary | ICD-10-CM

## 2013-03-07 DIAGNOSIS — F419 Anxiety disorder, unspecified: Secondary | ICD-10-CM

## 2013-03-07 DIAGNOSIS — E538 Deficiency of other specified B group vitamins: Secondary | ICD-10-CM | POA: Diagnosis not present

## 2013-03-07 DIAGNOSIS — M1990 Unspecified osteoarthritis, unspecified site: Secondary | ICD-10-CM

## 2013-03-07 MED ORDER — CYANOCOBALAMIN 1000 MCG/ML IJ SOLN
1000.0000 ug | Freq: Once | INTRAMUSCULAR | Status: AC
  Administered 2013-03-07: 1000 ug via INTRAMUSCULAR

## 2013-03-07 NOTE — Assessment & Plan Note (Signed)
Significant symptoms - overlap with medical concerns Continue daily Xanax as has been on same chronically for years. Also Pamelor and Halicon for sleep, no change recommended  Refills provided

## 2013-03-07 NOTE — Patient Instructions (Signed)
It was good to see you today.  We have reviewed your prior records including labs and tests today  Medications reviewed and updated -no changes at this time.  Please schedule followup in 6 months, call sooner if problems.

## 2013-03-07 NOTE — Assessment & Plan Note (Signed)
Encouraged to change scheduled over-the-counter NSAIDs to scheduled Tylenol x 2 weeks Continue hydrocodone as needed, rare use reviewed follow up with sports medicine as ongoing, recent tx and SI injection 02/2013 reviewed

## 2013-03-07 NOTE — Progress Notes (Signed)
Pre-visit discussion using our clinic review tool. No additional management support is needed unless otherwise documented below in the visit note.  

## 2013-03-07 NOTE — Progress Notes (Signed)
  Subjective:    Patient ID: Sarah [name], female    DOB: 06/13/1926, 79 y.o.   MRN: [id number]  HPI here for follow up - review chronic medical issues today and interval medical events:   Past Medical History  Diagnosis Date  . Osteoarthritis   . Bladder incontinence   . Constipation     hx of  . Hypertension   . Hyperlipidemia   . Chest pain     S/P nuclear 07/17/2008  normal; EF 88% with no ischemia  . Chronic venous insufficiency     BLE edema, chronic, neg venous US 2/13  . History of chicken pox   . Depression   . Diverticulitis   . Polyp of colon   . Osteoporosis, postmenopausal 06/02/2011  . Esophageal stricture     s/p dilation 04/2012  . GERD (gastroesophageal reflux disease)     Review of Systems  Constitutional: Negative for fever and fatigue.  Respiratory: Negative for cough and shortness of breath.   Cardiovascular: Negative for chest pain and leg swelling.       Objective:   Physical Exam BP 130/72  Pulse 82  Temp(Src) 98.6 F (37 C) (Oral)  Wt 153 lb (69.4 kg)  SpO2 95% Wt Readings from Last 3 Encounters:  03/07/13 153 lb (69.4 kg)  01/16/13 158 lb 4.8 oz (71.804 kg)  12/15/12 159 lb (72.122 kg)   Constitutional: She is elderly but spry and fit, appears well-developed and well-nourished. No distress.  Neck: Normal range of motion. Neck supple. No JVD present. No thyromegaly present.  Cardiovascular: Normal rate, regular rhythm and normal heart sounds.  No murmur heard. 1+ nonpitting BLE edema feet to below knee. Pulmonary/Chest: Effort normal and breath sounds normal. No respiratory distress. She has no wheezes. Neurological: She is alert and oriented to person, place, and time. No cranial nerve deficit. Coordination normal.  Psychiatric: She has a perseverating mood and affect. Her behavior is normal. Judgment and thought content normal.   Lab Results  Component Value Date   WBC 5.4 06/28/2012   HGB 12.1 06/28/2012   HCT 36.1 06/28/2012   PLT 284.0  06/28/2012   GLUCOSE 95 01/16/2013   CHOL 193 06/28/2012   TRIG 60.0 06/28/2012   HDL 92.20 06/28/2012   LDLDIRECT 101.1 10/21/2010   LDLCALC 89 06/28/2012   ALT 13 06/28/2012   AST 9 06/28/2012   NA 133* 01/16/2013   K 5.2 01/16/2013   CL 98 01/16/2013   CREATININE 0.77 01/16/2013   BUN 16 01/16/2013   CO2 26 01/16/2013   TSH 5.43 12/06/2012   HGBA1C 5.9 01/16/2013   Lab Results  Component Value Date   VITAMINB12 426 12/15/2012      Assessment & Plan:   see problem list. Medications and labs reviewed today.  Time spent with pt/aide Lawanna Kobus) today 25 minutes, greater than 50% time spent counseling patient on OA and "back"/SI pain, anxiety and medication review. Also review of prior records

## 2013-03-07 NOTE — Assessment & Plan Note (Signed)
Historically intolerant of statins Working on diet and exercise to control same Follows labs semiannually with cardiology Continue management of same 

## 2013-03-12 ENCOUNTER — Telehealth: Payer: Self-pay | Admitting: *Deleted

## 2013-03-12 NOTE — Telephone Encounter (Signed)
[facility] Triage Call Report Triage Record Num: [id number] Operator: Hillary Bow Patient Name: Sarah [name] Call Date & Time: 03/10/2013 4:34:42PM Patient Phone: [phone] PCP: Terrilee Files Patient Gender: Female PCP Fax : [phone] Patient DOB: 10-07-1926 Practice Name: [facility] Reason for Call: Caller: [name]/Patient; PCP: Terrilee Files; CB#: [phone]; Call regarding Back Pain; onset 02-26-13. Pt was diagnosed w/ Arthritis at Spine recently Pt received shot on 12-4 for pain per Pt. Acetaminophen 500mg  x2 taken prior to triage, pain has eased off w/ heating pad per Pt. Pt is wanting to set up appt on 12-15 for another shot. Offered flexeril and Ultram per standing orders for pain, Pt states she can't take flexeril and would rather f/u w/ office when reopens on 12-15 for appt to discuss another shot. All emergent sxs ruled out per Back sxs protocol, see in 24 hrs d/t pain intensifies w/ coughing or movement. Advised Pt to be seen at ED if pain worsen. Pt verbalized understanding. Protocol(s) Used: Back Symptoms Recommended Outcome per Protocol: See Provider within 24 hours Reason for Outcome: Pain intensifies with coughing, sneezing or straining Care Advice: ~

## 2013-03-12 NOTE — Telephone Encounter (Signed)
When you hve a chance please call patient and ask 1. Are you going to PT?  If not she needs to.  2.  Is she trying the over the counter medicines?  If not she needs to.  3.  Is she doing home exercises?  If not she needs to.  4. If she is doing all of them I probably need to see her to discuss getting epidural injections .   Thank you!

## 2013-03-12 NOTE — Telephone Encounter (Signed)
Pt called states she is having continuing issue with her back. States she is using cold compresses. Please advise

## 2013-03-13 ENCOUNTER — Encounter: Payer: Self-pay | Admitting: *Deleted

## 2013-03-13 ENCOUNTER — Ambulatory Visit: Payer: Medicare Other | Admitting: Cardiology

## 2013-03-13 ENCOUNTER — Other Ambulatory Visit: Payer: Medicare Other

## 2013-03-13 ENCOUNTER — Other Ambulatory Visit: Payer: Self-pay | Admitting: Cardiology

## 2013-03-13 NOTE — Telephone Encounter (Signed)
We can try epidural injections if she would like then.

## 2013-03-13 NOTE — Telephone Encounter (Signed)
Spoke to pt, she states she is continuing to have pain. She has been taking Tylenol 500mg  TID. Ice pack BID & icy hot patches. Pt states that she is in too much pain to go to PT & she has not been doing any of the home exercises. Please advise.

## 2013-03-14 NOTE — Telephone Encounter (Signed)
Discussed with pt. She states that she is feeling somewhat better this morning. Pt states that she is leaving Monday to go out of town & would like to wait on the injection until after the Holidays. Pt states she will call the office when she gets back in town to let us know how she is doing.

## 2013-03-15 ENCOUNTER — Other Ambulatory Visit: Payer: Self-pay | Admitting: *Deleted

## 2013-03-16 MED ORDER — ALPRAZOLAM 1 MG PO TABS: 1.0000 mg | ORAL_TABLET | Freq: Two times a day (BID) | ORAL | Status: DC | PRN

## 2013-03-16 NOTE — Telephone Encounter (Signed)
Notified [place] [facility] gave md approval.../lmb

## 2013-04-02 ENCOUNTER — Telehealth: Payer: Self-pay | Admitting: Neurology

## 2013-04-02 NOTE — Telephone Encounter (Signed)
Returned patient's call.  We will discuss PT at her next visit.  Kwane Rohl K. Posey Pronto, DO

## 2013-04-02 NOTE — Telephone Encounter (Signed)
Pt wants to know if Dr. Posey Pronto can recommend any leg exercises for her that pay improve her neuropathy. Please call / Sherri S.

## 2013-04-02 NOTE — Telephone Encounter (Signed)
Please advise 

## 2013-04-05 ENCOUNTER — Telehealth: Payer: Self-pay | Admitting: *Deleted

## 2013-04-05 NOTE — Telephone Encounter (Signed)
Notified pt with md response.../lmb 

## 2013-04-05 NOTE — Telephone Encounter (Signed)
I think the medical team she sees for her various problems is a good group of providers and all doing the right thing for her and her problems. I would not recommended any change

## 2013-04-05 NOTE — Telephone Encounter (Signed)
Left msg on vm stating wanting to know what md thinks about her children suggestions. They are telling her that she is going to see too many doctors. She says she see a specialist for everything that is wrong with her Dr. Posey Pronto- Neurothapy, Dr. Tamala Julian - Back and Dr. Mare Ferrari for her Heart...[name]

## 2013-04-18 ENCOUNTER — Encounter: Payer: Self-pay | Admitting: Neurology

## 2013-04-18 ENCOUNTER — Ambulatory Visit (INDEPENDENT_AMBULATORY_CARE_PROVIDER_SITE_OTHER): Payer: Medicare Other | Admitting: Neurology

## 2013-04-18 VITALS — BP 134/78 | HR 100 | Resp 20 | Ht 61.0 in | Wt 149.0 lb

## 2013-04-18 DIAGNOSIS — M47817 Spondylosis without myelopathy or radiculopathy, lumbosacral region: Secondary | ICD-10-CM

## 2013-04-18 DIAGNOSIS — G608 Other hereditary and idiopathic neuropathies: Secondary | ICD-10-CM

## 2013-04-18 DIAGNOSIS — G8929 Other chronic pain: Secondary | ICD-10-CM | POA: Diagnosis not present

## 2013-04-18 DIAGNOSIS — M549 Dorsalgia, unspecified: Secondary | ICD-10-CM | POA: Diagnosis not present

## 2013-04-18 NOTE — Patient Instructions (Signed)
1.  Start physical therapy for gait training and leg strengthening  2.  Fall precautions discussed 3.  Return to clinic in 20-months

## 2013-04-18 NOTE — Progress Notes (Signed)
[facility]  Follow-up Visit   Date: 04/18/2013   Sarah [name] MRN: [id number] DOB: [birth year]   Interim History: Sarah [name] is a 80 y.o. right-handed Caucasian female with history of HTN, HPL, depression, and GERD returning to the clinic for follow-up of leg weaknes.  The patient was accompanied to the clinic by her son.  She was last seen in the office on 01/16/2013.   History of present illness: Leg weakness started slowly around May 2014 when she noticed difficulty with walking and tripping on things. She has a hard time describing her walking problems, but says that her legs feel weak. She would stumble frequently, and denies any falls.  She has associated bilateral achy knee pain which feels tight and is worse when she applies pressure on it. Her symptoms steadily worsened. She reports having chronic back pain but had worsening pain in August 2014. Pain is achy located at the low back and does not radiate into her buttocks or legs. Forward flexion makes her pain worse and it is improved by laying down.   Follow-up 01/16/2013:  Clinically unchanged.  Neuropathy labs which all returned normal.  EMG was performed and shows moderate peripheral neuropathy of the legs and L3-L4 old radiculopathy.   Follow-up 04/18/2013:  In December, she saw Dr. Tamala Julian for back pain who found SI joint dysfunction and performed injection which some benefit.  Because of ongoing leg weakness, she started using a cane in November 2014 and feels much more stable.  She also has a caregiver that comes 10-2p who helps her dress, prepare meals, and does her errands which she appreciates a lot.  Last week, she unfortunately was eating peanuts and fried green tomatoes and broke her upper left teeth.  There have been no interval falls, illnesses, or hospitalizations. She denies any numbness/tingling, but does complain feeling unsteady especially on uneven surfaces. Her son also has noted  that her posture has become more stooped over the past 6 months.    Medications:  Current Outpatient Prescriptions on File Prior to Visit  Medication Sig Dispense Refill  . acetaminophen (TYLENOL) 500 MG tablet Take 1 tablet (500 mg total) by mouth every 8 (eight) hours as needed.  180 tablet  2  . ALPRAZolam (XANAX) 1 MG tablet Take 1 tablet (1 mg total) by mouth 2 (two) times daily as needed.  60 tablet  5  . aspirin 81 MG tablet Take 81 mg by mouth daily.      . Calcium Carbonate-Vit D-Min (CALCIUM 1200 PO) Take 1 tablet by mouth daily.      . Cholecalciferol (VITAMIN D PO) Take by mouth. Taking 2000 daily       . clobetasol ointment (TEMOVATE) 1.02 % Apply 1 application topically 2 (two) times daily.       . cyanocobalamin (,VITAMIN B-12,) 1000 MCG/ML injection Inject 1 mL (1,000 mcg total) into the muscle every 30 (thirty) days.  1 mL  3  . ferrous sulfate 325 (65 FE) MG EC tablet Take 1 tablet (325 mg total) by mouth daily with breakfast.  90 tablet  3  . furosemide (LASIX) 20 MG tablet Take 1 tablet (20 mg total) by mouth as needed.  30 tablet  5  . Glucosamine HCl 750 MG TABS Take by mouth 2 (two) times daily.      Marland Kitchen ibandronate (BONIVA) 150 MG tablet Take 1 tablet (150 mg total) by mouth every 30 (thirty) days.  1 tablet  5  . Misc Natural Products (TUMERSAID) TABS Take 1 capsule by mouth 2 (two) times daily.  60 tablet  3  . naproxen sodium (ALEVE) 220 MG tablet Take 220 mg by mouth 3 (three) times daily with meals.      . nortriptyline (PAMELOR) 25 MG capsule Take 1 capsule (25 mg total) by mouth at bedtime.  30 capsule  5  . Omega-3 Fatty Acids (FISH OIL) 1000 MG CAPS Take 2 capsules (2,000 mg total) by mouth daily.  180 capsule  1  . pantoprazole (PROTONIX) 40 MG tablet TAKE 1 TABLET ONCE DAILY.  30 tablet  3  . perphenazine (TRILAFON) 2 MG tablet Take 1 tablet (2 mg total) by mouth at bedtime.  30 tablet  5  . solifenacin (VESICARE) 10 MG tablet Take 1 tablet (10 mg total) by  mouth as needed.  30 tablet  5  . tiZANidine (ZANAFLEX) 4 MG capsule Take 1 capsule (4 mg total) by mouth 3 (three) times daily as needed for muscle spasms.  60 capsule  0  . triazolam (HALCION) 0.25 MG tablet Take 0.5-1 tablets (0.125-0.25 mg total) by mouth at bedtime as needed.  30 tablet  5  . nitroGLYCERIN (NITROSTAT) 0.4 MG SL tablet Place 0.4 mg under the tongue every 5 (five) minutes as needed for chest pain.      . [DISCONTINUED] solifenacin (VESICARE) 10 MG tablet Take 10 mg by mouth as needed.        No current facility-administered medications on file prior to visit.    Allergies:  Allergies  Allergen Reactions  . Celebrex [Celecoxib]     rash  . Lipitor [Atorvastatin Calcium]     Leg weakness,memory loss  . Lovastatin     Leg weakness  . Other     Always pads  . Latex Rash     Review of Systems:  CONSTITUTIONAL: No fevers, chills, night sweats, or weight loss.   EYES: No visual changes or eye pain ENT: No hearing changes.  No history of nose bleeds.   RESPIRATORY: No cough, wheezing and shortness of breath.   CARDIOVASCULAR: Negative for chest pain, and palpitations.   GI: Negative for abdominal discomfort, blood in stools or black stools.  No recent change in bowel habits.   GU:  No history of incontinence.   MUSCLOSKELETAL: No history of joint pain or swelling.  No myalgias.  + back pain SKIN: Negative for lesions, rash, and itching.   HEMATOLOGY/ONCOLOGY: Negative for prolonged bleeding, bruising easily, and swollen nodes.   ENDOCRINE: Negative for cold or heat intolerance, polydipsia or goiter.   PSYCH:  No depression or anxiety symptoms.   NEURO: As Above.   Vital Signs:  BP 134/78  Pulse 100  Resp 20  Ht 5\' 1"  (1.549 m)  Wt 149 lb (67.586 kg)  BMI 28.17 kg/m2   Neurological Exam: MENTAL STATUS including orientation to time, place, person, recent and remote memory, attention span and concentration, language, and fund of knowledge is normal.  Speech  is not dysarthric. Slightly blunted affect.  CRANIAL NERVES: Pupils round and reactive to light. Extraocular muscles are intact. Face is symmetric. Palate elevates symmetrically and tongue is midline.  MOTOR:  Motor strength is 5/5 in all extremities, except 4+/5 quadriceps and 5-/5 toe extensors bilaterally. No atrophy, fasciculations or abnormal movements.  No pronator drift.    MSRs:  Right  Left brachioradialis 2+  brachioradialis 2+  biceps 2+  biceps 2+  triceps 2+  triceps 2+  patellar 1+  patellar 1+  ankle jerk 0  ankle jerk 0  Hoffman no  Hoffman no   SENSORY:  Absent vibration distal to ankles bilaterally.  Light touch intact.   Romberg's sign present.    COORDINATION/GAIT:  Finger tapping is reduced in amplitude and speed bilaterally. She is unable to rise from a chair without using arms.  Stooped posture, small steps, gait narrow based and stable.   Data: XR lumbar spine 06/07/2012:  Curvature convex to the left. Worsening of degenerative disc disease at T12-L1, L1-2 and L4-5.  Lab Results  Component Value Date   HGBA1C 5.9 01/16/2013   Component     Latest Ref Rng 12/06/2012 12/15/2012  TSH     0.35 - 5.50 uIU/mL 5.43   Vitamin B-12     211 - 911 pg/mL  426  Methylmalonic Acid, Quant     <0.40 umol/L  0.22  Ceruloplasmin     20 - 60 mg/dL  29  Copper     70 - 175 mcg/dL  114  SPEP/UPEP with IFE - no monoclonal gammopathy  EMG 01/15/2013:  Taken together, these findings are consistent with a moderate generalized, large fiber, sensorimotor polyneuropathy affecting the left side. There is a superimposed intraspinal canal lesion (i.e. radiculopathy) affecting the L2-L4 nerve root/segments bilaterally, moderate in degree electrically.   IMPRESSION/PLAN: 1.  Multifactorial gait instability, which largely seems to be stemming from lumbosacral radiculopathy given her bilateral leg weakness, and to a lesser  degree distal sensorimotor polyneuropathy.  - Clinically worsening, now using a cane and has a care giver.    - She continues to have intermittent low back pain, though attributes mild benefit after doing some home stretching techniques and getting the steroids injection her SI joint  - Neuropathy workup has been unremarkable, though she has a history of B12 deficiency and is getting supplementation  - I recommend physical therapy for gait training and leg strengthening  - I have stressed the importance of fall precautions and using a gait assistive device, whether that includes a cane or a 4-wheeled rollator  - Recommended the patient to limit driving for local areas only 3.  Return to clinic in 2-3 months, or sooner as needed  The duration of this appointment visit was 45 minutes of face-to-face time with the patient.  Greater than 50% of this time was spent in counseling, explanation of diagnosis, planning of further management, and coordination of care.   Thank you for allowing me to participate in patient's care.  If I can answer any additional questions, I would be pleased to do so.    Sincerely,    Jerauld Bostwick K. Posey Pronto, DO

## 2013-04-20 ENCOUNTER — Ambulatory Visit: Payer: Medicare Other | Admitting: Neurology

## 2013-04-23 ENCOUNTER — Telehealth: Payer: Self-pay | Admitting: *Deleted

## 2013-04-23 ENCOUNTER — Telehealth: Payer: Self-pay | Admitting: Neurology

## 2013-04-23 DIAGNOSIS — R269 Unspecified abnormalities of gait and mobility: Secondary | ICD-10-CM

## 2013-04-23 NOTE — Telephone Encounter (Signed)
Patient called about starting  physical therapy at the [facility] rehab center however they have moved and does not offer physical therapy in home.  Referral has been  made to [facility]. All information has been submitted.

## 2013-04-23 NOTE — Telephone Encounter (Signed)
Please call pt w/ name of PT group on [facility], pt says Dr. Posey Pronto recommended them/ Sarah [name]

## 2013-04-23 NOTE — Telephone Encounter (Signed)
Noted.  Quantrell Splitt K. Khrystyna Schwalm, DO   

## 2013-04-23 NOTE — Telephone Encounter (Signed)
Spoke with patient about her Physical therapy referral. Informed her someone from [facility] will be giving her a call to set up times and dates for her therapy

## 2013-04-23 NOTE — Telephone Encounter (Signed)
She should be fine for out-patient PT.  Does [facility] arrange for out-patient PT also?  If not, we'll have to send the referral to PT on [facility].  Thanks.

## 2013-04-23 NOTE — Telephone Encounter (Signed)
Patient wants in home physical therapy so i have  referred her to [facility]. The [facility] is no longer open

## 2013-04-30 ENCOUNTER — Telehealth: Payer: Self-pay | Admitting: Neurology

## 2013-04-30 NOTE — Telephone Encounter (Signed)
PT WOULD LIKE FOR A NURSE TO CALL HER REGARDING PHYSICAL THERAPY.

## 2013-04-30 NOTE — Telephone Encounter (Signed)
Spoke with patient no one has notified her from [facility] where I original referred. Did a new referral to [facility] will follow up Wedneday

## 2013-05-02 ENCOUNTER — Encounter: Payer: Self-pay | Admitting: *Deleted

## 2013-05-02 NOTE — Telephone Encounter (Signed)
Left a message for Sarah [name] to go ahead and start PT in May if that is the only time pt will be available to start.

## 2013-05-02 NOTE — Telephone Encounter (Signed)
Sarah [name] from [facility] called and stated that since pt can only do physical therapy on Fridays, the therapy Will not start until Friday 08/01/13.  [phone] #

## 2013-05-03 ENCOUNTER — Telehealth: Payer: Self-pay | Admitting: Neurology

## 2013-05-03 NOTE — Telephone Encounter (Signed)
Spoke with pt and she just wanted to make sure that Dr. Posey Pronto did want her to have Physical Therapy.  Informed her that that is what Dr. Posey Pronto does want for her to do.  Patient agreed with plan.

## 2013-05-03 NOTE — Telephone Encounter (Signed)
Sarah [name], can you contact patient and be sure that she is ok with starting PT in May?  If not, we can try other home PT services, but all depends on her insurance and availability.  [name] Posey Pronto, DO

## 2013-05-03 NOTE — Telephone Encounter (Signed)
PT called back and they are actually starting this Friday.

## 2013-05-04 DIAGNOSIS — I10 Essential (primary) hypertension: Secondary | ICD-10-CM | POA: Diagnosis not present

## 2013-05-04 DIAGNOSIS — IMO0001 Reserved for inherently not codable concepts without codable children: Secondary | ICD-10-CM | POA: Diagnosis not present

## 2013-05-04 DIAGNOSIS — R269 Unspecified abnormalities of gait and mobility: Secondary | ICD-10-CM | POA: Diagnosis not present

## 2013-05-04 DIAGNOSIS — R293 Abnormal posture: Secondary | ICD-10-CM | POA: Diagnosis not present

## 2013-05-04 DIAGNOSIS — IMO0002 Reserved for concepts with insufficient information to code with codable children: Secondary | ICD-10-CM | POA: Diagnosis not present

## 2013-05-04 DIAGNOSIS — M810 Age-related osteoporosis without current pathological fracture: Secondary | ICD-10-CM | POA: Diagnosis not present

## 2013-05-04 DIAGNOSIS — M999 Biomechanical lesion, unspecified: Secondary | ICD-10-CM | POA: Diagnosis not present

## 2013-05-07 NOTE — Telephone Encounter (Signed)
error 

## 2013-05-08 DIAGNOSIS — M810 Age-related osteoporosis without current pathological fracture: Secondary | ICD-10-CM | POA: Diagnosis not present

## 2013-05-08 DIAGNOSIS — IMO0002 Reserved for concepts with insufficient information to code with codable children: Secondary | ICD-10-CM | POA: Diagnosis not present

## 2013-05-08 DIAGNOSIS — M999 Biomechanical lesion, unspecified: Secondary | ICD-10-CM | POA: Diagnosis not present

## 2013-05-08 DIAGNOSIS — IMO0001 Reserved for inherently not codable concepts without codable children: Secondary | ICD-10-CM | POA: Diagnosis not present

## 2013-05-08 DIAGNOSIS — R293 Abnormal posture: Secondary | ICD-10-CM | POA: Diagnosis not present

## 2013-05-08 DIAGNOSIS — R269 Unspecified abnormalities of gait and mobility: Secondary | ICD-10-CM | POA: Diagnosis not present

## 2013-05-11 DIAGNOSIS — IMO0002 Reserved for concepts with insufficient information to code with codable children: Secondary | ICD-10-CM | POA: Diagnosis not present

## 2013-05-11 DIAGNOSIS — M999 Biomechanical lesion, unspecified: Secondary | ICD-10-CM | POA: Diagnosis not present

## 2013-05-11 DIAGNOSIS — M810 Age-related osteoporosis without current pathological fracture: Secondary | ICD-10-CM | POA: Diagnosis not present

## 2013-05-11 DIAGNOSIS — R293 Abnormal posture: Secondary | ICD-10-CM | POA: Diagnosis not present

## 2013-05-11 DIAGNOSIS — IMO0001 Reserved for inherently not codable concepts without codable children: Secondary | ICD-10-CM | POA: Diagnosis not present

## 2013-05-11 DIAGNOSIS — R269 Unspecified abnormalities of gait and mobility: Secondary | ICD-10-CM | POA: Diagnosis not present

## 2013-05-14 DIAGNOSIS — M810 Age-related osteoporosis without current pathological fracture: Secondary | ICD-10-CM | POA: Diagnosis not present

## 2013-05-14 DIAGNOSIS — M999 Biomechanical lesion, unspecified: Secondary | ICD-10-CM | POA: Diagnosis not present

## 2013-05-14 DIAGNOSIS — IMO0002 Reserved for concepts with insufficient information to code with codable children: Secondary | ICD-10-CM | POA: Diagnosis not present

## 2013-05-14 DIAGNOSIS — R293 Abnormal posture: Secondary | ICD-10-CM | POA: Diagnosis not present

## 2013-05-14 DIAGNOSIS — IMO0001 Reserved for inherently not codable concepts without codable children: Secondary | ICD-10-CM | POA: Diagnosis not present

## 2013-05-14 DIAGNOSIS — R269 Unspecified abnormalities of gait and mobility: Secondary | ICD-10-CM | POA: Diagnosis not present

## 2013-05-18 ENCOUNTER — Ambulatory Visit: Payer: Medicare Other | Admitting: Cardiology

## 2013-05-18 ENCOUNTER — Other Ambulatory Visit: Payer: Medicare Other

## 2013-05-18 DIAGNOSIS — M810 Age-related osteoporosis without current pathological fracture: Secondary | ICD-10-CM | POA: Diagnosis not present

## 2013-05-18 DIAGNOSIS — IMO0001 Reserved for inherently not codable concepts without codable children: Secondary | ICD-10-CM | POA: Diagnosis not present

## 2013-05-18 DIAGNOSIS — IMO0002 Reserved for concepts with insufficient information to code with codable children: Secondary | ICD-10-CM | POA: Diagnosis not present

## 2013-05-18 DIAGNOSIS — R269 Unspecified abnormalities of gait and mobility: Secondary | ICD-10-CM | POA: Diagnosis not present

## 2013-05-18 DIAGNOSIS — R293 Abnormal posture: Secondary | ICD-10-CM | POA: Diagnosis not present

## 2013-05-18 DIAGNOSIS — M999 Biomechanical lesion, unspecified: Secondary | ICD-10-CM | POA: Diagnosis not present

## 2013-05-21 DIAGNOSIS — IMO0001 Reserved for inherently not codable concepts without codable children: Secondary | ICD-10-CM | POA: Diagnosis not present

## 2013-05-21 DIAGNOSIS — M810 Age-related osteoporosis without current pathological fracture: Secondary | ICD-10-CM | POA: Diagnosis not present

## 2013-05-21 DIAGNOSIS — R269 Unspecified abnormalities of gait and mobility: Secondary | ICD-10-CM | POA: Diagnosis not present

## 2013-05-21 DIAGNOSIS — M999 Biomechanical lesion, unspecified: Secondary | ICD-10-CM | POA: Diagnosis not present

## 2013-05-21 DIAGNOSIS — R293 Abnormal posture: Secondary | ICD-10-CM | POA: Diagnosis not present

## 2013-05-21 DIAGNOSIS — IMO0002 Reserved for concepts with insufficient information to code with codable children: Secondary | ICD-10-CM | POA: Diagnosis not present

## 2013-05-23 DIAGNOSIS — IMO0002 Reserved for concepts with insufficient information to code with codable children: Secondary | ICD-10-CM | POA: Diagnosis not present

## 2013-05-23 DIAGNOSIS — R269 Unspecified abnormalities of gait and mobility: Secondary | ICD-10-CM | POA: Diagnosis not present

## 2013-05-23 DIAGNOSIS — M810 Age-related osteoporosis without current pathological fracture: Secondary | ICD-10-CM | POA: Diagnosis not present

## 2013-05-23 DIAGNOSIS — R293 Abnormal posture: Secondary | ICD-10-CM | POA: Diagnosis not present

## 2013-05-23 DIAGNOSIS — IMO0001 Reserved for inherently not codable concepts without codable children: Secondary | ICD-10-CM | POA: Diagnosis not present

## 2013-05-23 DIAGNOSIS — M999 Biomechanical lesion, unspecified: Secondary | ICD-10-CM | POA: Diagnosis not present

## 2013-05-25 ENCOUNTER — Ambulatory Visit: Payer: Medicare Other | Admitting: Cardiology

## 2013-05-29 ENCOUNTER — Telehealth: Payer: Self-pay | Admitting: Cardiology

## 2013-05-29 NOTE — Telephone Encounter (Signed)
New message    Patient calling have some concerns regarding lab work.

## 2013-05-29 NOTE — Telephone Encounter (Signed)
Busy x 3, will try again tomorrow.

## 2013-05-30 ENCOUNTER — Telehealth: Payer: Self-pay | Admitting: *Deleted

## 2013-05-30 ENCOUNTER — Other Ambulatory Visit: Payer: Self-pay | Admitting: *Deleted

## 2013-05-30 DIAGNOSIS — R269 Unspecified abnormalities of gait and mobility: Secondary | ICD-10-CM | POA: Diagnosis not present

## 2013-05-30 DIAGNOSIS — M810 Age-related osteoporosis without current pathological fracture: Secondary | ICD-10-CM | POA: Diagnosis not present

## 2013-05-30 DIAGNOSIS — R293 Abnormal posture: Secondary | ICD-10-CM | POA: Diagnosis not present

## 2013-05-30 DIAGNOSIS — M999 Biomechanical lesion, unspecified: Secondary | ICD-10-CM | POA: Diagnosis not present

## 2013-05-30 DIAGNOSIS — IMO0001 Reserved for inherently not codable concepts without codable children: Secondary | ICD-10-CM | POA: Diagnosis not present

## 2013-05-30 DIAGNOSIS — IMO0002 Reserved for concepts with insufficient information to code with codable children: Secondary | ICD-10-CM | POA: Diagnosis not present

## 2013-05-30 NOTE — Telephone Encounter (Signed)
Patient having other labs drawn at [facility] and wanted to have her labs  Dr. Mare Ferrari requesting for her April appointment. Will mail her a Rx for those to take with her.

## 2013-05-30 NOTE — Telephone Encounter (Signed)
That would be fine.  If they need a new order, please let me know.  Thanks,  Rever Pichette K. Posey Pronto, DO

## 2013-05-30 NOTE — Telephone Encounter (Signed)
PT from [facility] called requesting 2 week extension for pt's therapy.  Verbal ok given.

## 2013-05-30 NOTE — Telephone Encounter (Signed)
Follow up ° ° ° ° °Returning Sarah [name]'s call °

## 2013-06-04 ENCOUNTER — Encounter: Payer: Self-pay | Admitting: Cardiology

## 2013-06-04 ENCOUNTER — Other Ambulatory Visit: Payer: Self-pay | Admitting: *Deleted

## 2013-06-04 DIAGNOSIS — M8700 Idiopathic aseptic necrosis of unspecified bone: Secondary | ICD-10-CM | POA: Diagnosis not present

## 2013-06-04 DIAGNOSIS — E7800 Pure hypercholesterolemia, unspecified: Secondary | ICD-10-CM | POA: Diagnosis not present

## 2013-06-05 DIAGNOSIS — M810 Age-related osteoporosis without current pathological fracture: Secondary | ICD-10-CM | POA: Diagnosis not present

## 2013-06-05 DIAGNOSIS — R269 Unspecified abnormalities of gait and mobility: Secondary | ICD-10-CM | POA: Diagnosis not present

## 2013-06-05 DIAGNOSIS — IMO0002 Reserved for concepts with insufficient information to code with codable children: Secondary | ICD-10-CM | POA: Diagnosis not present

## 2013-06-05 DIAGNOSIS — R293 Abnormal posture: Secondary | ICD-10-CM | POA: Diagnosis not present

## 2013-06-05 DIAGNOSIS — IMO0001 Reserved for inherently not codable concepts without codable children: Secondary | ICD-10-CM | POA: Diagnosis not present

## 2013-06-05 DIAGNOSIS — M999 Biomechanical lesion, unspecified: Secondary | ICD-10-CM | POA: Diagnosis not present

## 2013-06-06 ENCOUNTER — Telehealth: Payer: Self-pay | Admitting: Cardiology

## 2013-06-06 NOTE — Telephone Encounter (Signed)
New message    Son calling - follow up nurse to discuss mother care - since she in declining health or does she need an appt to come in.

## 2013-06-06 NOTE — Telephone Encounter (Signed)
Spoke with patients son and she has declined since July 4 per son. Patient has no energy, problems with weak legs, and  just does not get ready to do anything unless she has an appointment. Recommended he contact PCP, discussed with  Dr. Mare Ferrari and he agreed.

## 2013-06-06 NOTE — Telephone Encounter (Signed)
Left message to call back  

## 2013-06-06 NOTE — Progress Notes (Signed)
Quick Note:  Please report to patient. The recent labs are stable. Continue same medication and careful diet. ______ 

## 2013-06-07 ENCOUNTER — Telehealth: Payer: Self-pay | Admitting: *Deleted

## 2013-06-07 ENCOUNTER — Other Ambulatory Visit (INDEPENDENT_AMBULATORY_CARE_PROVIDER_SITE_OTHER): Payer: Medicare Other

## 2013-06-07 ENCOUNTER — Other Ambulatory Visit: Payer: Self-pay | Admitting: Internal Medicine

## 2013-06-07 DIAGNOSIS — R5381 Other malaise: Secondary | ICD-10-CM | POA: Diagnosis not present

## 2013-06-07 DIAGNOSIS — R5383 Other fatigue: Principal | ICD-10-CM

## 2013-06-07 LAB — BASIC METABOLIC PANEL
BUN: 17 mg/dL (ref 6–23)
CO2: 30 meq/L (ref 19–32)
Calcium: 8.9 mg/dL (ref 8.4–10.5)
Chloride: 96 mEq/L (ref 96–112)
Creatinine, Ser: 0.7 mg/dL (ref 0.4–1.2)
GFR: 85.99 mL/min (ref >60.00)
Glucose, Bld: 117 mg/dL — ABNORMAL HIGH (ref 70–99)
POTASSIUM: 4.2 meq/L (ref 3.5–5.1)
Sodium: 131 mEq/L — ABNORMAL LOW (ref 135–145)

## 2013-06-07 LAB — CBC WITH DIFFERENTIAL/PLATELET
Basophils Absolute: 0 10*3/uL (ref 0.0–0.1)
Basophils Relative: 0.4 % (ref 0.0–3.0)
EOS PCT: 0.9 % (ref 0.0–5.0)
Eosinophils Absolute: 0.1 10*3/uL (ref 0.0–0.7)
HCT: 35.9 % — ABNORMAL LOW (ref 36.0–46.0)
Hemoglobin: 12 g/dL (ref 12.0–15.0)
Lymphocytes Relative: 22.1 % (ref 12.0–46.0)
Lymphs Abs: 1.3 10*3/uL (ref 0.7–4.0)
MCHC: 33.5 g/dL (ref 30.0–36.0)
MCV: 96.3 fl (ref 78.0–100.0)
Monocytes Absolute: 0.5 10*3/uL (ref 0.1–1.0)
Monocytes Relative: 8.4 % (ref 3.0–12.0)
NEUTROS PCT: 68.2 % (ref 43.0–77.0)
Neutro Abs: 4.1 10*3/uL (ref 1.4–7.7)
Platelets: 364 10*3/uL (ref 150.0–400.0)
RBC: 3.73 Mil/uL — AB (ref 3.87–5.11)
RDW: 13.7 % (ref 11.5–14.6)
WBC: 6 10*3/uL (ref 4.5–10.5)

## 2013-06-07 LAB — TSH: TSH: 4.27 u[IU]/mL (ref 0.35–5.50)

## 2013-06-07 NOTE — Telephone Encounter (Signed)
Many labs just done in last 6 mo at 11/2012 neuro visit for same symptoms Will order repeat CBC, BMet and TSH - ok for lab only - done thanks

## 2013-06-07 NOTE — Telephone Encounter (Signed)
Notified pt with md response entered labs...Sarah [name]

## 2013-06-07 NOTE — Telephone Encounter (Signed)
Pt states she is having a bad time. No energy, always tired wanting to see will md do a full bloodwork...Sarah [name]

## 2013-06-08 ENCOUNTER — Telehealth: Payer: Self-pay | Admitting: Neurology

## 2013-06-08 DIAGNOSIS — R269 Unspecified abnormalities of gait and mobility: Secondary | ICD-10-CM | POA: Diagnosis not present

## 2013-06-08 DIAGNOSIS — R293 Abnormal posture: Secondary | ICD-10-CM | POA: Diagnosis not present

## 2013-06-08 DIAGNOSIS — M999 Biomechanical lesion, unspecified: Secondary | ICD-10-CM | POA: Diagnosis not present

## 2013-06-08 DIAGNOSIS — IMO0001 Reserved for inherently not codable concepts without codable children: Secondary | ICD-10-CM | POA: Diagnosis not present

## 2013-06-08 DIAGNOSIS — M810 Age-related osteoporosis without current pathological fracture: Secondary | ICD-10-CM | POA: Diagnosis not present

## 2013-06-08 DIAGNOSIS — IMO0002 Reserved for concepts with insufficient information to code with codable children: Secondary | ICD-10-CM | POA: Diagnosis not present

## 2013-06-08 NOTE — Telephone Encounter (Signed)
Please call pt before you leave she said she does not want to worry all weekend  Thank you

## 2013-06-08 NOTE — Telephone Encounter (Signed)
Noted.  Donika K. Patel, DO   

## 2013-06-08 NOTE — Telephone Encounter (Signed)
Spoke with patient and she said that she has been really weak and tired all the time.  Her physical therapist told her that she needs more protein and calories in her diet.  She is getting some things today from the grocery store to try.  She would like Dr. Serita Grit opinion.  Also informed her that have tried to call her daughter back x 2 but keep getting voicemail.  Patient said she has tried to call her too.    Spoke with Dr. Posey Pronto and she said that patient does need more calories and protein but she needs to talk to her PCP about this as well.  Called patient back and informed her of what Dr. Posey Pronto said.  She said that she called her PCP this morning and his CMA told her that all of her blood work came back ok.  Patient's daughter thinks that she needs to go to the hospital but patient does not want to go.  She has a woman from [employer] that comes over everyday to help her and she said that she will get better care from her than from the hospital.  Instructed her to go to the hospital if she gets worse.  She agreed with plan.

## 2013-06-08 NOTE — Telephone Encounter (Signed)
Pt would like to talk to you about her condition and she has had blood work over the last three day and they cant find anything wrong please call her at [phone]

## 2013-06-08 NOTE — Telephone Encounter (Signed)
Pt's daughter would like to speak to a nurse regarding her mom.  Please call [name] [phone]

## 2013-06-11 DIAGNOSIS — R269 Unspecified abnormalities of gait and mobility: Secondary | ICD-10-CM | POA: Diagnosis not present

## 2013-06-11 DIAGNOSIS — IMO0002 Reserved for concepts with insufficient information to code with codable children: Secondary | ICD-10-CM | POA: Diagnosis not present

## 2013-06-11 DIAGNOSIS — M999 Biomechanical lesion, unspecified: Secondary | ICD-10-CM | POA: Diagnosis not present

## 2013-06-11 DIAGNOSIS — IMO0001 Reserved for inherently not codable concepts without codable children: Secondary | ICD-10-CM | POA: Diagnosis not present

## 2013-06-11 DIAGNOSIS — R293 Abnormal posture: Secondary | ICD-10-CM | POA: Diagnosis not present

## 2013-06-11 DIAGNOSIS — M810 Age-related osteoporosis without current pathological fracture: Secondary | ICD-10-CM | POA: Diagnosis not present

## 2013-06-12 ENCOUNTER — Ambulatory Visit (INDEPENDENT_AMBULATORY_CARE_PROVIDER_SITE_OTHER): Payer: Medicare Other | Admitting: *Deleted

## 2013-06-12 DIAGNOSIS — E538 Deficiency of other specified B group vitamins: Secondary | ICD-10-CM | POA: Diagnosis not present

## 2013-06-12 MED ORDER — CYANOCOBALAMIN 1000 MCG/ML IJ SOLN
1000.0000 ug | Freq: Once | INTRAMUSCULAR | Status: AC
  Administered 2013-06-12: 1000 ug via INTRAMUSCULAR

## 2013-06-15 DIAGNOSIS — M999 Biomechanical lesion, unspecified: Secondary | ICD-10-CM | POA: Diagnosis not present

## 2013-06-15 DIAGNOSIS — R269 Unspecified abnormalities of gait and mobility: Secondary | ICD-10-CM | POA: Diagnosis not present

## 2013-06-15 DIAGNOSIS — IMO0001 Reserved for inherently not codable concepts without codable children: Secondary | ICD-10-CM | POA: Diagnosis not present

## 2013-06-15 DIAGNOSIS — M810 Age-related osteoporosis without current pathological fracture: Secondary | ICD-10-CM | POA: Diagnosis not present

## 2013-06-15 DIAGNOSIS — R293 Abnormal posture: Secondary | ICD-10-CM | POA: Diagnosis not present

## 2013-06-15 DIAGNOSIS — IMO0002 Reserved for concepts with insufficient information to code with codable children: Secondary | ICD-10-CM | POA: Diagnosis not present

## 2013-06-18 ENCOUNTER — Ambulatory Visit (INDEPENDENT_AMBULATORY_CARE_PROVIDER_SITE_OTHER): Payer: Medicare Other | Admitting: Internal Medicine

## 2013-06-18 ENCOUNTER — Encounter: Payer: Self-pay | Admitting: Internal Medicine

## 2013-06-18 VITALS — BP 110/70 | HR 87 | Temp 98.7°F | Wt 151.1 lb

## 2013-06-18 DIAGNOSIS — N760 Acute vaginitis: Secondary | ICD-10-CM

## 2013-06-18 DIAGNOSIS — K921 Melena: Secondary | ICD-10-CM | POA: Diagnosis not present

## 2013-06-18 DIAGNOSIS — R5381 Other malaise: Secondary | ICD-10-CM | POA: Diagnosis not present

## 2013-06-18 DIAGNOSIS — R5383 Other fatigue: Secondary | ICD-10-CM | POA: Diagnosis not present

## 2013-06-18 MED ORDER — MICONAZOLE NITRATE 100 MG VA SUPP: 100.0000 mg | Freq: Every day | VAGINAL | Status: DC

## 2013-06-18 NOTE — Progress Notes (Signed)
Pre visit review using our clinic review tool, if applicable. No additional management support is needed unless otherwise documented below in the visit note. 

## 2013-06-18 NOTE — Patient Instructions (Signed)
It was good to see you today.  We have reviewed your prior records including labs and tests today  No evidence for blood in your stool on exam today  Medications reviewed and updated -no prescription changes Continue Burnell Blanks feminine wash and use over-the-counter Monistat ointment to affected area as needed Do not use Q-tips, only fingertips  Please schedule followup in 6 months, call sooner if problems.

## 2013-06-18 NOTE — Progress Notes (Signed)
Subjective:    Patient ID: Sarah [name], female    DOB: 07-06-1926, 80 y.o.   MRN: [id number]  HPI  Patient here today for evaluation of vaginitis symptoms and blood in stool.  States vaginitis chronic for >10 years.  Has not tried OTC tx recently. Sx of burning and itching.  No discharge or odor.  Blood in stool - noticed BRB in stool last week.  No constipation or diarrhea. No fevers or chills.  Hx of hemorrhoids but no sx of such recently.   Past Medical History  Diagnosis Date  . Osteoarthritis   . Bladder incontinence   . Constipation     hx of  . Hypertension   . Hyperlipidemia   . Chest pain     S/P nuclear 07/17/2008  normal; EF 88% with no ischemia  . Chronic venous insufficiency     BLE edema, chronic, neg venous US 2/13  . History of chicken pox   . Depression   . Diverticulitis   . Polyp of colon   . Osteoporosis, postmenopausal 06/02/2011  . Esophageal stricture     s/p dilation 04/2012  . GERD (gastroesophageal reflux disease)      Review of Systems  Constitutional: Positive for activity change (decreased activity) and fatigue (chronic). Negative for fever, chills and appetite change.  Gastrointestinal: Positive for blood in stool. Negative for nausea, vomiting, diarrhea, constipation, abdominal distention and rectal pain.  Genitourinary: Positive for vaginal pain (itching and burning). Negative for vaginal discharge and pelvic pain.       Objective:   Physical Exam  Constitutional: She is oriented to person, place, and time. She appears well-developed and well-nourished. No distress.  Neck: Normal range of motion. Neck supple. No thyromegaly present.  Cardiovascular: Normal rate, regular rhythm and normal heart sounds.   No murmur heard. Pulmonary/Chest: Effort normal and breath sounds normal. No respiratory distress. She has no wheezes.  Abdominal: Soft. Bowel sounds are normal. She exhibits no distension and no mass. There is no tenderness. There is  no guarding.  Genitourinary: Rectum normal. Rectal exam shows no external hemorrhoid, no internal hemorrhoid, no fissure and no mass. Guaiac negative stool.  Musculoskeletal: Normal range of motion.  Lymphadenopathy:    She has no cervical adenopathy.  Neurological: She is alert and oriented to person, place, and time.  Skin: Skin is warm and dry. She is not diaphoretic.  Psychiatric: She has a normal mood and affect. Her behavior is normal. Judgment and thought content normal.    Wt Readings from Last 3 Encounters:  06/18/13 151 lb 1.9 oz (68.548 kg)  04/18/13 149 lb (67.586 kg)  03/07/13 153 lb (69.4 kg)   BP Readings from Last 3 Encounters:  06/18/13 110/70  04/18/13 134/78  03/07/13 130/72   Lab Results  Component Value Date   WBC 6.0 06/07/2013   HGB 12.0 06/07/2013   HCT 35.9* 06/07/2013   PLT 364.0 06/07/2013   GLUCOSE 117* 06/07/2013   CHOL 193 06/28/2012   TRIG 60.0 06/28/2012   HDL 92.20 06/28/2012   LDLDIRECT 101.1 10/21/2010   LDLCALC 89 06/28/2012   ALT 13 06/28/2012   AST 9 06/28/2012   NA 131* 06/07/2013   K 4.2 06/07/2013   CL 96 06/07/2013   CREATININE 0.7 06/07/2013   BUN 17 06/07/2013   CO2 30 06/07/2013   TSH 4.27 06/07/2013   HGBA1C 5.9 01/16/2013       Assessment & Plan:   Reported blood in stool  x 1 episode last week -  FOB today negative - recent CBC stable - reassurance provided  Problem List Items Addressed This Visit   Vaginitis and vulvovaginitis     Chronic symptoms - felt related to atrophic vaginitis on prior gyn eval Pt did not tolerate/start vaginal estrogen due to feared side effects  recommended OTC vaginal antifungal prn and follow up with gyn prn Also check UA     Other Visit Diagnoses   Blood in stool    -  Primary    Other malaise and fatigue          Fatigue - nonspecific symptoms/exam - reviewed recent negative screening labs - reassurance provided  Time spent with pt and caregiver(aide) today 25 minutes, greater than 50% time spent  counseling patient on fatigue, vaginitis symptoms, blood in stool last week and medication review. Also review of prior records

## 2013-06-18 NOTE — Assessment & Plan Note (Signed)
Chronic symptoms - felt related to atrophic vaginitis on prior gyn eval Pt did not tolerate/start vaginal estrogen due to feared side effects  recommended OTC vaginal antifungal prn and follow up with gyn prn Also check UA

## 2013-06-19 DIAGNOSIS — M999 Biomechanical lesion, unspecified: Secondary | ICD-10-CM | POA: Diagnosis not present

## 2013-06-19 DIAGNOSIS — M810 Age-related osteoporosis without current pathological fracture: Secondary | ICD-10-CM | POA: Diagnosis not present

## 2013-06-19 DIAGNOSIS — IMO0001 Reserved for inherently not codable concepts without codable children: Secondary | ICD-10-CM | POA: Diagnosis not present

## 2013-06-19 DIAGNOSIS — IMO0002 Reserved for concepts with insufficient information to code with codable children: Secondary | ICD-10-CM | POA: Diagnosis not present

## 2013-06-19 DIAGNOSIS — R269 Unspecified abnormalities of gait and mobility: Secondary | ICD-10-CM | POA: Diagnosis not present

## 2013-06-19 DIAGNOSIS — R293 Abnormal posture: Secondary | ICD-10-CM | POA: Diagnosis not present

## 2013-06-20 DIAGNOSIS — H524 Presbyopia: Secondary | ICD-10-CM | POA: Diagnosis not present

## 2013-06-20 DIAGNOSIS — H353190 Nonexudative age-related macular degeneration, unspecified eye, stage unspecified: Secondary | ICD-10-CM | POA: Diagnosis not present

## 2013-06-20 DIAGNOSIS — H01009 Unspecified blepharitis unspecified eye, unspecified eyelid: Secondary | ICD-10-CM | POA: Diagnosis not present

## 2013-06-20 DIAGNOSIS — H2510 Age-related nuclear cataract, unspecified eye: Secondary | ICD-10-CM | POA: Diagnosis not present

## 2013-06-20 NOTE — Progress Notes (Signed)
Quick Note:  Please report to patient. The recent labs are stable. Continue same medication and careful diet. ______ 

## 2013-06-22 DIAGNOSIS — M999 Biomechanical lesion, unspecified: Secondary | ICD-10-CM | POA: Diagnosis not present

## 2013-06-22 DIAGNOSIS — M810 Age-related osteoporosis without current pathological fracture: Secondary | ICD-10-CM | POA: Diagnosis not present

## 2013-06-22 DIAGNOSIS — IMO0001 Reserved for inherently not codable concepts without codable children: Secondary | ICD-10-CM | POA: Diagnosis not present

## 2013-06-22 DIAGNOSIS — IMO0002 Reserved for concepts with insufficient information to code with codable children: Secondary | ICD-10-CM | POA: Diagnosis not present

## 2013-06-22 DIAGNOSIS — R269 Unspecified abnormalities of gait and mobility: Secondary | ICD-10-CM | POA: Diagnosis not present

## 2013-06-22 DIAGNOSIS — R293 Abnormal posture: Secondary | ICD-10-CM | POA: Diagnosis not present

## 2013-06-26 DIAGNOSIS — IMO0002 Reserved for concepts with insufficient information to code with codable children: Secondary | ICD-10-CM | POA: Diagnosis not present

## 2013-06-26 DIAGNOSIS — IMO0001 Reserved for inherently not codable concepts without codable children: Secondary | ICD-10-CM | POA: Diagnosis not present

## 2013-06-26 DIAGNOSIS — R269 Unspecified abnormalities of gait and mobility: Secondary | ICD-10-CM | POA: Diagnosis not present

## 2013-06-26 DIAGNOSIS — M810 Age-related osteoporosis without current pathological fracture: Secondary | ICD-10-CM | POA: Diagnosis not present

## 2013-06-26 DIAGNOSIS — R293 Abnormal posture: Secondary | ICD-10-CM | POA: Diagnosis not present

## 2013-06-26 DIAGNOSIS — M999 Biomechanical lesion, unspecified: Secondary | ICD-10-CM | POA: Diagnosis not present

## 2013-06-29 DIAGNOSIS — R293 Abnormal posture: Secondary | ICD-10-CM | POA: Diagnosis not present

## 2013-06-29 DIAGNOSIS — M810 Age-related osteoporosis without current pathological fracture: Secondary | ICD-10-CM | POA: Diagnosis not present

## 2013-06-29 DIAGNOSIS — IMO0001 Reserved for inherently not codable concepts without codable children: Secondary | ICD-10-CM | POA: Diagnosis not present

## 2013-06-29 DIAGNOSIS — R269 Unspecified abnormalities of gait and mobility: Secondary | ICD-10-CM | POA: Diagnosis not present

## 2013-06-29 DIAGNOSIS — M999 Biomechanical lesion, unspecified: Secondary | ICD-10-CM | POA: Diagnosis not present

## 2013-06-29 DIAGNOSIS — IMO0002 Reserved for concepts with insufficient information to code with codable children: Secondary | ICD-10-CM | POA: Diagnosis not present

## 2013-07-10 ENCOUNTER — Ambulatory Visit (INDEPENDENT_AMBULATORY_CARE_PROVIDER_SITE_OTHER): Payer: Medicare Other | Admitting: Neurology

## 2013-07-10 ENCOUNTER — Other Ambulatory Visit: Payer: Self-pay | Admitting: Internal Medicine

## 2013-07-10 ENCOUNTER — Encounter: Payer: Self-pay | Admitting: Neurology

## 2013-07-10 VITALS — BP 130/74 | HR 96 | Wt 153.3 lb

## 2013-07-10 DIAGNOSIS — M47817 Spondylosis without myelopathy or radiculopathy, lumbosacral region: Secondary | ICD-10-CM | POA: Diagnosis not present

## 2013-07-10 DIAGNOSIS — G608 Other hereditary and idiopathic neuropathies: Secondary | ICD-10-CM

## 2013-07-10 DIAGNOSIS — R269 Unspecified abnormalities of gait and mobility: Secondary | ICD-10-CM

## 2013-07-10 NOTE — Patient Instructions (Signed)
1.  Continue home exercises as provided by the therapist 2.  Return to clinic in 64-months

## 2013-07-10 NOTE — Progress Notes (Signed)
[facility]  Follow-up Visit   Date: 07/10/2013   [name] MRN: [id number] DOB: 10-Jul-1926   Interim History: Sarah [name] is a 80 y.o. right-handed Caucasian female with history of HTN, HPL, depression, esophageal strictures, and GERD returning to the clinic for follow-up of leg weaknes.  The patient was accompanied to the clinic by self.  She was last seen in the office on 04/18/2013.   History of present illness: Leg weakness started slowly around May 2014 when she noticed difficulty with walking and tripping on things. She has a hard time describing her walking problems, but says that her legs feel weak. She would stumble frequently, and denies any falls.  She has associated bilateral achy knee pain which feels tight and is worse when she applies pressure on it. Her symptoms steadily worsened. She reports having chronic back pain but had worsening pain in August 2014. Pain is achy located at the low back and does not radiate into her buttocks or legs. Forward flexion makes her pain worse and it is improved by laying down.   Follow-up 01/16/2013:  Clinically unchanged.  Neuropathy labs which all returned normal.  EMG was performed and shows moderate peripheral neuropathy of the legs and L3-L4 old radiculopathy.   Follow-up 04/18/2013:  In December, she saw Dr. Tamala Julian for back pain who found SI joint dysfunction and performed injection which some benefit. Because of ongoing leg weakness, she started using a cane in November 2014 and feels much more stable.  She also has a caregiver that comes 10-2p who helps her dress, prepare meals, and does her errands which she appreciates a lot.    - Follow 07/10/2013:  She has completed physical therapy and reports significant improvement, because she is able to lift her legs and a band much easier. Her caregiver encourages her to continue her home exercises.  She also tells me that the past few months have been very  difficult because she has had ongoing problems losing 6 teeth and is scheduled to have implants.  There have been no interval falls, illnesses, or hospitalizations.  She has no new neurological symptoms.  She does complain of taking naps during the day, lasting 45 minutes. She feels that since the time change she has never been able to catch up on her sleep. Mood is good and she is excited about an upcoming trip with her family in May.    Medications:  Current Outpatient Prescriptions on File Prior to Visit  Medication Sig Dispense Refill  . acetaminophen (TYLENOL) 500 MG tablet Take 1 tablet (500 mg total) by mouth every 8 (eight) hours as needed.  180 tablet  2  . ALPRAZolam (XANAX) 1 MG tablet Take 1 tablet (1 mg total) by mouth 2 (two) times daily as needed.  60 tablet  5  . aspirin 81 MG tablet Take 81 mg by mouth daily.      . Calcium Carbonate-Vit D-Min (CALCIUM 1200 PO) Take 1 tablet by mouth daily.      . Cholecalciferol (VITAMIN D PO) Take by mouth. Taking 2000 daily       . clobetasol ointment (TEMOVATE) 9.50 % Apply 1 application topically 2 (two) times daily.       . cyanocobalamin (,VITAMIN B-12,) 1000 MCG/ML injection Inject 1 mL (1,000 mcg total) into the muscle every 30 (thirty) days.  1 mL  3  . ferrous sulfate 325 (65 FE) MG EC tablet Take 1 tablet (325 mg total)  by mouth daily with breakfast.  90 tablet  3  . furosemide (LASIX) 20 MG tablet Take 1 tablet (20 mg total) by mouth as needed.  30 tablet  5  . Glucosamine HCl 750 MG TABS Take by mouth 2 (two) times daily.      Marland Kitchen ibandronate (BONIVA) 150 MG tablet Take 1 tablet (150 mg total) by mouth every 30 (thirty) days.  1 tablet  5  . miconazole (MONISTAT 7) 100 MG vaginal suppository Place 1 suppository (100 mg total) vaginally at bedtime.  7 suppository  0  . Misc Natural Products (TUMERSAID) TABS Take 1 capsule by mouth 2 (two) times daily.  60 tablet  3  . naproxen sodium (ALEVE) 220 MG tablet Take 220 mg by mouth 3 (three)  times daily with meals.      . nitroGLYCERIN (NITROSTAT) 0.4 MG SL tablet Place 0.4 mg under the tongue every 5 (five) minutes as needed for chest pain.      . nortriptyline (PAMELOR) 25 MG capsule Take 1 capsule (25 mg total) by mouth at bedtime.  30 capsule  5  . Omega-3 Fatty Acids (FISH OIL) 1000 MG CAPS Take 2 capsules (2,000 mg total) by mouth daily.  180 capsule  1  . pantoprazole (PROTONIX) 40 MG tablet TAKE 1 TABLET ONCE DAILY.  30 tablet  3  . perphenazine (TRILAFON) 2 MG tablet Take 1 tablet (2 mg total) by mouth at bedtime.  30 tablet  5  . solifenacin (VESICARE) 10 MG tablet Take 1 tablet (10 mg total) by mouth as needed.  30 tablet  5  . tiZANidine (ZANAFLEX) 4 MG capsule Take 1 capsule (4 mg total) by mouth 3 (three) times daily as needed for muscle spasms.  60 capsule  0  . triazolam (HALCION) 0.25 MG tablet Take 0.5-1 tablets (0.125-0.25 mg total) by mouth at bedtime as needed.  30 tablet  5  . [DISCONTINUED] solifenacin (VESICARE) 10 MG tablet Take 10 mg by mouth as needed.        No current facility-administered medications on file prior to visit.    Allergies:  Allergies  Allergen Reactions  . Celebrex [Celecoxib]     rash  . Lipitor [Atorvastatin Calcium]     Leg weakness,memory loss  . Lovastatin     Leg weakness  . Other     Always pads  . Latex Rash     Review of Systems:  CONSTITUTIONAL: No fevers, chills, night sweats, or weight loss.   EYES: No visual changes or eye pain ENT: No hearing changes.  No history of nose bleeds.  + Difficulty swallowing RESPIRATORY: No cough, wheezing and shortness of breath.   CARDIOVASCULAR: Negative for chest pain, and palpitations.   GI: Negative for abdominal discomfort, blood in stools or black stools.  No recent change in bowel habits.  + Esophageal stricture GU:  No history of incontinence.   MUSCLOSKELETAL: No history of joint pain or swelling.  No myalgias.  + back pain SKIN: Negative for lesions, rash, and  itching.   HEMATOLOGY/ONCOLOGY: Negative for prolonged bleeding, bruising easily, and swollen nodes.   ENDOCRINE: Negative for cold or heat intolerance, polydipsia or goiter.   PSYCH:  No depression or anxiety symptoms.   NEURO: As Above.   Vital Signs:  BP 130/74  Pulse 96  Wt 153 lb 5 oz (69.542 kg)  SpO2 99%   Neurological Exam: MENTAL STATUS including orientation to time, place, person, recent and remote memory, attention  span and concentration, language, and fund of knowledge is fair.  Speech is not dysarthric. Slightly blunted affect.  She was unable to complete trail making test or copy a cube.  Recall and calculation was also impaired.  CRANIAL NERVES: Pupils round and reactive to light. Extraocular muscles are intact. Face is symmetric. Palate elevates symmetrically and tongue is midline.  MOTOR:  Motor strength is 5/5 in all extremities, except 5-/5 quadriceps (improved) and 5-/5 toe extensors bilaterally. No atrophy, fasciculations or abnormal movements.  No pronator drift.  Tone is normal throughout.  MSRs:  Right                                                                 Left brachioradialis 2+  brachioradialis 2+  biceps 2+  biceps 2+  triceps 2+  triceps 2+  patellar 1+  patellar 1+  ankle jerk 0  ankle jerk 0  Hoffman no  Hoffman no   SENSORY:  Absent vibration distal to ankles bilaterally.    COORDINATION/GAIT:  Finger tapping is reduced in amplitude and speed bilaterally. She is unable to rise from a chair without using arms.  Stooped posture, small steps, gait narrow based and stable.   Data: XR lumbar spine 06/07/2012:  Curvature convex to the left. Worsening of degenerative disc disease at T12-L1, L1-2 and L4-5.   Labs 12/06/2012:  TSH 5.43 Labs 12/15/2012:  Vitamin B12 426, MMA 0.22, ceruloplasmin 29, copper 114 SPEP/UPEP with IFE - no monoclonal gammopathy Labs 01/16/2013:  HbA1c 5.9   EMG 01/15/2013:  Taken together, these findings are consistent  with a moderate generalized, large fiber, sensorimotor polyneuropathy affecting the left side. There is a superimposed intraspinal canal lesion (i.e. radiculopathy) affecting the L2-L4 nerve root/segments bilaterally, moderate in degree electrically.   IMPRESSION/PLAN: 1.  Multifactorial gait instability, which largely seems to be stemming from lumbosacral radiculopathy given her bilateral leg weakness, and to a lesser degree distal sensorimotor polyneuropathy.    - Clinically improved after completing physical therapy  - Continue home exercises.    - Continue to use Rolator for gait assist 2.  Cognitive impairment  - Recommended patient not to drive, which she understands  - Neuropathy workup has been unremarkable, though she has a history of B12 deficiency and is getting supplementation 3. Daytime somnolence  - I do not feel that this excessive as she is napping only 45 minutes per day 4. There are subtle parkinsonian features (blunted affect, slightly slowed movements, stooped posture with shuffling gait) which I will continue to follow clinically.  There is no tremor or rigidity on exam. 5.  Return to clinic in 3 months, or sooner as needed  The duration of this appointment visit was 35 minutes of face-to-face time with the patient.  Greater than 50% of this time was spent in counseling, explanation of diagnosis, planning of further management, and coordination of care.   Thank you for allowing me to participate in patient's care.  If I can answer any additional questions, I would be pleased to do so.    Sincerely,    Janellie Tennison K. Posey Pronto, DO

## 2013-07-18 ENCOUNTER — Telehealth: Payer: Self-pay | Admitting: *Deleted

## 2013-07-18 NOTE — Telephone Encounter (Signed)
Left msg on vm need to set-up appt for her b12 injections. Called pt back transferred her to schedulers to set up appt...[name]

## 2013-07-19 ENCOUNTER — Ambulatory Visit (INDEPENDENT_AMBULATORY_CARE_PROVIDER_SITE_OTHER): Payer: Medicare Other | Admitting: *Deleted

## 2013-07-19 DIAGNOSIS — E538 Deficiency of other specified B group vitamins: Secondary | ICD-10-CM | POA: Diagnosis not present

## 2013-07-19 MED ORDER — CYANOCOBALAMIN 1000 MCG/ML IJ SOLN
1000.0000 ug | Freq: Once | INTRAMUSCULAR | Status: AC
  Administered 2013-07-19: 1000 ug via INTRAMUSCULAR

## 2013-07-20 ENCOUNTER — Encounter: Payer: Self-pay | Admitting: Cardiology

## 2013-07-20 ENCOUNTER — Other Ambulatory Visit: Payer: Medicare Other

## 2013-07-20 ENCOUNTER — Ambulatory Visit (INDEPENDENT_AMBULATORY_CARE_PROVIDER_SITE_OTHER): Payer: Medicare Other | Admitting: Cardiology

## 2013-07-20 VITALS — BP 141/73 | HR 82 | Ht 61.0 in | Wt 154.0 lb

## 2013-07-20 DIAGNOSIS — R079 Chest pain, unspecified: Secondary | ICD-10-CM

## 2013-07-20 DIAGNOSIS — R609 Edema, unspecified: Secondary | ICD-10-CM

## 2013-07-20 DIAGNOSIS — I119 Hypertensive heart disease without heart failure: Secondary | ICD-10-CM

## 2013-07-20 DIAGNOSIS — E7800 Pure hypercholesterolemia, unspecified: Secondary | ICD-10-CM

## 2013-07-20 DIAGNOSIS — R000 Tachycardia, unspecified: Secondary | ICD-10-CM

## 2013-07-20 MED ORDER — NITROGLYCERIN 0.4 MG SL SUBL: 0.4000 mg | SUBLINGUAL_TABLET | SUBLINGUAL | Status: DC | PRN

## 2013-07-20 NOTE — Assessment & Plan Note (Signed)
The patient has had mild ankle edema.  She has not had any symptoms of phlebitis.  She does take nonsteroidal anti-inflammatory agents such as Aleve which may be contributing to the fluid retention.  She has 20 mg Lasix on hand for when necessary use.

## 2013-07-20 NOTE — Assessment & Plan Note (Signed)
The patient has not had any recent awareness of tachycardia or palpitations.

## 2013-07-20 NOTE — Progress Notes (Signed)
Roe Coombs                                                                Date of Birth:  03-Apr-1926 [address] [place], [place]  [address] [phone]         Fax   [phone]  History of Present Illness: This pleasant 80 year old widowed Caucasian female is seen for a 6 month followup office visit.  She has a past history of atypical chest pain. She does not have any history of ischemic heart disease. She had a normal nuclear stress test in April 2012. She has a normal left ventricular function with ejection fraction of 88%. She does have a history of hypercholesterolemia but is intolerant of statins because of leg weakness and memory loss. Since last visit she is been stable from a cardiac standpoint.  She continues to have occasional chest discomfort relieved by sublingual nitroglycerin.  She has been diagnosed by her neurologist Dr. Posey Pronto with neuropathy.  It affects her feet.  She is no longer allowed to drive.  She has a woman that comes to her home and drives her to appointments and does her shopping for her etc. the patient recently finished 9 weeks of home health physical therapy and she feels that it is definitely benefited her strength and her balance.  Current Outpatient Prescriptions  Medication Sig Dispense Refill  . acetaminophen (TYLENOL) 500 MG tablet Take 1 tablet (500 mg total) by mouth every 8 (eight) hours as needed.  180 tablet  2  . ALPRAZolam (XANAX) 1 MG tablet Take 1 tablet (1 mg total) by mouth 2 (two) times daily as needed.  60 tablet  5  . aspirin 81 MG tablet Take 81 mg by mouth daily.      . Calcium Carbonate-Vit D-Min (CALCIUM 1200 PO) Take 1 tablet by mouth daily.      . Cholecalciferol (VITAMIN D PO) Take by mouth. Taking 2000 daily       . clobetasol ointment (TEMOVATE) 5.36 % Apply 1 application topically 2 (two) times daily.       . cyanocobalamin (,VITAMIN B-12,) 1000 MCG/ML injection Inject 1 mL (1,000 mcg total) into the muscle every  30 (thirty) days.  1 mL  3  . ferrous sulfate 325 (65 FE) MG EC tablet Take 1 tablet (325 mg total) by mouth daily with breakfast.  90 tablet  3  . furosemide (LASIX) 20 MG tablet Take 1 tablet (20 mg total) by mouth as needed.  30 tablet  5  . Glucosamine HCl 750 MG TABS Take by mouth 2 (two) times daily.      Marland Kitchen ibandronate (BONIVA) 150 MG tablet Take 1 tablet (150 mg total) by mouth every 30 (thirty) days.  1 tablet  5  . miconazole (MONISTAT 7) 100 MG vaginal suppository Place 1 suppository (100 mg total) vaginally at bedtime.  7 suppository  0  . Misc Natural Products (TUMERSAID) TABS Take 1 capsule by mouth 2 (two) times daily.  60 tablet  3  . naproxen sodium (ALEVE) 220 MG tablet Take 220 mg by mouth 3 (three) times daily with meals.      . nitroGLYCERIN (NITROSTAT) 0.4 MG SL tablet Place 1 tablet (0.4 mg total) under  the tongue every 5 (five) minutes as needed for chest pain.  100 tablet  prn  . nortriptyline (PAMELOR) 25 MG capsule Take 1 capsule (25 mg total) by mouth at bedtime.  30 capsule  5  . Omega-3 Fatty Acids (FISH OIL) 1000 MG CAPS Take 2 capsules (2,000 mg total) by mouth daily.  180 capsule  1  . pantoprazole (PROTONIX) 40 MG tablet TAKE 1 TABLET ONCE DAILY.  30 tablet  3  . perphenazine (TRILAFON) 2 MG tablet TAKE ONE TABLET AT BEDTIME.  30 tablet  5  . solifenacin (VESICARE) 10 MG tablet Take 1 tablet (10 mg total) by mouth as needed.  30 tablet  5  . tiZANidine (ZANAFLEX) 4 MG capsule Take 1 capsule (4 mg total) by mouth 3 (three) times daily as needed for muscle spasms.  60 capsule  0  . triazolam (HALCION) 0.25 MG tablet Take 0.5-1 tablets (0.125-0.25 mg total) by mouth at bedtime as needed.  30 tablet  5  . [DISCONTINUED] solifenacin (VESICARE) 10 MG tablet Take 10 mg by mouth as needed.        No current facility-administered medications for this visit.    Allergies  Allergen Reactions  . Celebrex [Celecoxib]     rash  . Lipitor [Atorvastatin Calcium]     Leg  weakness,memory loss  . Lovastatin     Leg weakness  . Other     Always pads  . Latex Rash    Patient Active Problem List   Diagnosis Date Noted  . Lumbosacral spondylosis without myelopathy 02/26/2013  . Abnormality of gait 12/15/2012  . Vaginitis and vulvovaginitis 12/06/2012  . Right shoulder pain   . Vitamin B12 deficiency 04/13/2012  . Hypercholesterolemia 11/05/2011  . Osteoporosis, postmenopausal 06/02/2011  . Mixed incontinence urge and stress 06/02/2011  . Anxiety   . Edema 04/06/2011  . Tachycardia 04/06/2011  . Osteoarthritis   . Chest pain     History  Smoking status  . Never Smoker   Smokeless tobacco  . Never Used    History  Alcohol Use No    Family History  Problem Relation Age of Onset  . Cancer Son     testicular cancer  . Colon cancer Neg Hx   . Esophageal cancer Neg Hx   . Rectal cancer Neg Hx   . Stomach cancer Neg Hx   . Breast cancer Sister   . Skin cancer Sister     Review of Systems: Constitutional: no fever chills diaphoresis or fatigue or change in weight.  Head and neck: no hearing loss, no epistaxis, no photophobia or visual disturbance. Respiratory: No cough, shortness of breath or wheezing. Cardiovascular: No chest pain peripheral edema, palpitations. Gastrointestinal: No abdominal distention, no abdominal pain, no change in bowel habits hematochezia or melena. Genitourinary: No dysuria, no frequency, no urgency, no nocturia. Musculoskeletal:No arthralgias, no back pain, no gait disturbance or myalgias. Neurological: No dizziness, no headaches, no numbness, no seizures, no syncope, no weakness, no tremors. Hematologic: No lymphadenopathy, no easy bruising. Psychiatric: No confusion, no hallucinations, no sleep disturbance.    Physical Exam: Filed Vitals:   07/20/13 1115  BP: 141/73  Pulse: 82   the general appearance reveals a pleasant elderly woman in no distress.The head and neck exam reveals pupils equal and  reactive.  Extraocular movements are full.  There is no scleral icterus.  The mouth and pharynx are normal.  The neck is supple.  The carotids reveal no bruits.  The  jugular venous pressure is normal.  The  thyroid is not enlarged.  There is no lymphadenopathy.  The chest is clear to percussion and auscultation.  There are no rales or rhonchi.  Expansion of the chest is symmetrical.  The precordium is quiet.  The first heart sound is normal.  The second heart sound is physiologically split.  There is no murmur gallop rub or click.  There is no abnormal lift or heave.  The abdomen is soft and nontender.  The bowel sounds are normal.  The liver and spleen are not enlarged.  There are no abdominal masses.  There are no abdominal bruits.  Extremities reveal good pedal pulses.  There is no phlebitis or edema.  There is no cyanosis or clubbing.  Strength is normal and symmetrical in all extremities.  There is no lateralizing weakness.  There are no sensory deficits.  The skin is warm and dry.  There is no rash.   EKG today shows normal sinus rhythm and is within normal limits.  Assessment / Plan: Continue same medication.  Avoid dietary salt.  Recheck in 6 months for followup office visit.

## 2013-07-20 NOTE — Assessment & Plan Note (Signed)
She patient is having occasional chest discomfort at rest.  She does not have any history of known ischemic heart disease and previous nuclear stress tests have been normal.  She does respond to sublingual nitroglycerin suggesting that she has an element of coronary spasm.  Continue to use sublingual nitroglycerin as necessary.

## 2013-07-20 NOTE — Patient Instructions (Addendum)
RESTART THE ASPIRIN 81 MG DAILY   Your physician recommends that you continue on your current medications as directed. Please refer to the Current Medication list given to you today.  Your physician wants you to follow-up in: 6 month ov  You will receive a reminder letter in the mail two months in advance. If you don't receive a letter, please call our office to schedule the follow-up appointment.

## 2013-07-31 ENCOUNTER — Other Ambulatory Visit: Payer: Self-pay | Admitting: *Deleted

## 2013-07-31 MED ORDER — TRIAZOLAM 0.25 MG PO TABS: 0.1250 mg | ORAL_TABLET | Freq: Every evening | ORAL | Status: DC | PRN

## 2013-07-31 NOTE — Telephone Encounter (Signed)
Faxed script back to [facility].../lmb 

## 2013-08-10 ENCOUNTER — Telehealth: Payer: Self-pay | Admitting: Cardiology

## 2013-08-10 NOTE — Telephone Encounter (Signed)
New message      Pt is having "pain on the bone of her chest" only when she turns to the left or right.  No shortness of breathe. Please advise

## 2013-08-10 NOTE — Telephone Encounter (Signed)
Pain feels like it is in the bone in center of chest, worse with movement of her arms. There is also a place on the bone sore to touch. Discomfort started last night but has improved since taking Advil. Patient denies any shortness of breath or other symptoms. Advised patient to contact her PCP, verbalized understanding.

## 2013-08-27 ENCOUNTER — Ambulatory Visit (INDEPENDENT_AMBULATORY_CARE_PROVIDER_SITE_OTHER): Payer: Medicare Other | Admitting: *Deleted

## 2013-08-27 DIAGNOSIS — D1801 Hemangioma of skin and subcutaneous tissue: Secondary | ICD-10-CM | POA: Diagnosis not present

## 2013-08-27 DIAGNOSIS — L219 Seborrheic dermatitis, unspecified: Secondary | ICD-10-CM | POA: Diagnosis not present

## 2013-08-27 DIAGNOSIS — E538 Deficiency of other specified B group vitamins: Secondary | ICD-10-CM | POA: Diagnosis not present

## 2013-08-27 DIAGNOSIS — L821 Other seborrheic keratosis: Secondary | ICD-10-CM | POA: Diagnosis not present

## 2013-08-27 DIAGNOSIS — L820 Inflamed seborrheic keratosis: Secondary | ICD-10-CM | POA: Diagnosis not present

## 2013-08-27 MED ORDER — CYANOCOBALAMIN 1000 MCG/ML IJ SOLN
1000.0000 ug | Freq: Once | INTRAMUSCULAR | Status: AC
  Administered 2013-08-27: 1000 ug via INTRAMUSCULAR

## 2013-09-03 ENCOUNTER — Other Ambulatory Visit: Payer: Self-pay | Admitting: *Deleted

## 2013-09-03 MED ORDER — ALPRAZOLAM 1 MG PO TABS: 1.0000 mg | ORAL_TABLET | Freq: Two times a day (BID) | ORAL | Status: DC | PRN

## 2013-09-04 MED ORDER — ALPRAZOLAM 1 MG PO TABS: 1.0000 mg | ORAL_TABLET | Freq: Two times a day (BID) | ORAL | Status: DC | PRN

## 2013-09-04 NOTE — Telephone Encounter (Signed)
Printed rx faxed script to [facility]...Sarah [name]

## 2013-09-04 NOTE — Addendum Note (Signed)
Addended by: Earnstine Regal on: 09/04/2013 08:14 AM   Modules accepted: Orders

## 2013-09-05 ENCOUNTER — Encounter: Payer: Self-pay | Admitting: Internal Medicine

## 2013-09-05 ENCOUNTER — Ambulatory Visit (INDEPENDENT_AMBULATORY_CARE_PROVIDER_SITE_OTHER): Payer: Medicare Other | Admitting: Internal Medicine

## 2013-09-05 VITALS — BP 120/60 | HR 88 | Temp 98.6°F | Wt 157.1 lb

## 2013-09-05 DIAGNOSIS — N760 Acute vaginitis: Secondary | ICD-10-CM | POA: Diagnosis not present

## 2013-09-05 DIAGNOSIS — E7800 Pure hypercholesterolemia, unspecified: Secondary | ICD-10-CM | POA: Diagnosis not present

## 2013-09-05 DIAGNOSIS — M47817 Spondylosis without myelopathy or radiculopathy, lumbosacral region: Secondary | ICD-10-CM

## 2013-09-05 DIAGNOSIS — R269 Unspecified abnormalities of gait and mobility: Secondary | ICD-10-CM

## 2013-09-05 NOTE — Assessment & Plan Note (Signed)
Chronic symptoms - felt related to atrophic vaginitis on prior gyn eval Pt did not tolerate/start vaginal estrogen due to feared side effects  recommended OTC vaginal antifungal prn and follow up with gyn prn

## 2013-09-05 NOTE — Patient Instructions (Signed)
It was good to see you today.  We have reviewed your prior records including labs and tests today  Medications reviewed and updated -no changes at this time.  Will set up appointment with Dr Tamala Julian to consider repeat injection for back as needed  Please schedule followup in 12 months, call sooner if problems.

## 2013-09-05 NOTE — Assessment & Plan Note (Signed)
As per neuro: Multifactorial gait instability, which largely seems to be stemming from lumbosacral radiculopathy given her bilateral leg weakness, and to a lesser degree distal sensorimotor polyneuropathy.  - Clinically improved after completing physical therapy  - Continue home exercises.  - Continue to use Rolator for gait assist  Will also arrange follow up with sports med as injection 02/2013 for same was beneifical

## 2013-09-05 NOTE — Progress Notes (Signed)
Subjective:    Patient ID: Sarah [name], female    DOB: May 15, 1926, 80 y.o.   MRN: [id number]  HPI  Patient is here for follow up  Reviewed chronic medical issues and interval medical events  Past Medical History  Diagnosis Date  . Osteoarthritis   . Bladder incontinence   . Constipation     hx of  . Hypertension   . Hyperlipidemia   . Chest pain     S/P nuclear 07/17/2008  normal; EF 88% with no ischemia  . Chronic venous insufficiency     BLE edema, chronic, neg venous US 2/13  . History of chicken pox   . Depression   . Diverticulitis   . Polyp of colon   . Osteoporosis, postmenopausal 06/02/2011  . Esophageal stricture     s/p dilation 04/2012  . GERD (gastroesophageal reflux disease)     Review of Systems  Constitutional: Positive for fatigue.  Respiratory: Negative for cough and shortness of breath.   Cardiovascular: Positive for leg swelling (ankles R>L, chronic). Negative for palpitations.  Musculoskeletal: Positive for myalgias.  Neurological: Positive for numbness. Negative for weakness.       Objective:   Physical Exam  BP 120/60  Pulse 88  Temp(Src) 98.6 F (37 C) (Oral)  Wt 157 lb 1.9 oz (71.269 kg)  SpO2 90% Wt Readings from Last 3 Encounters:  09/05/13 157 lb 1.9 oz (71.269 kg)  07/20/13 154 lb (69.854 kg)  07/10/13 153 lb 5 oz (69.542 kg)   Constitutional: She appears well-developed and well-nourished. No distress. aide at side Neck: Normal range of motion. Neck supple. No JVD present. No thyromegaly present.  Cardiovascular: Normal rate, regular rhythm and normal heart sounds.  No murmur heard. No BLE edema. Pulmonary/Chest: Effort normal and breath sounds normal. No respiratory distress. She has no wheezes.  Psychiatric: She has a flat facies, mood and affect. Her behavior is normal. Judgment and thought content normal.   Lab Results  Component Value Date   WBC 6.0 06/07/2013   HGB 12.0 06/07/2013   HCT 35.9* 06/07/2013   PLT 364.0  06/07/2013   GLUCOSE 117* 06/07/2013   CHOL 193 06/28/2012   TRIG 60.0 06/28/2012   HDL 92.20 06/28/2012   LDLDIRECT 101.1 10/21/2010   LDLCALC 89 06/28/2012   ALT 13 06/28/2012   AST 9 06/28/2012   NA 131* 06/07/2013   K 4.2 06/07/2013   CL 96 06/07/2013   CREATININE 0.7 06/07/2013   BUN 17 06/07/2013   CO2 30 06/07/2013   TSH 4.27 06/07/2013   HGBA1C 5.9 01/16/2013    Mm Digital Screening  11/07/2012   *RADIOLOGY REPORT*  Clinical Data: Screening.  DIGITAL SCREENING BILATERAL MAMMOGRAM WITH CAD  Comparison:  Previous exam(s).  FINDINGS:  ACR Breast Density Category b:  There are scattered areas of fibroglandular density.  There are no findings suspicious for malignancy.  Images were processed with CAD.  IMPRESSION: No mammographic evidence of malignancy.  A result letter of this screening mammogram will be mailed directly to the patient.  RECOMMENDATION: Screening mammogram in one year. (Code:SM-B-01Y)  BI-RADS CATEGORY 1:  Negative.   Original Report Authenticated By: Altamese Cabal, M.D.       Assessment & Plan:   Problem List Items Addressed This Visit   Abnormality of gait - Primary     As per neuro: Multifactorial gait instability, which largely seems to be stemming from lumbosacral radiculopathy given her bilateral leg weakness, and to a lesser  degree distal sensorimotor polyneuropathy.  - Clinically improved after completing physical therapy  - Continue home exercises.  - Continue to use Rolator for gait assist  Will also arrange follow up with sports med as injection 02/2013 for same was beneifical    Hypercholesterolemia     Historically intolerant of statins Working on diet and exercise to control same Follows labs semiannually with cardiology Continue management of same    Lumbosacral spondylosis without myelopathy   Vaginitis and vulvovaginitis     Chronic symptoms - felt related to atrophic vaginitis on prior gyn eval Pt did not tolerate/start vaginal estrogen due to feared  side effects  recommended OTC vaginal antifungal prn and follow up with gyn prn

## 2013-09-05 NOTE — Progress Notes (Signed)
Pre visit review using our clinic review tool, if applicable. No additional management support is needed unless otherwise documented below in the visit note. 

## 2013-09-05 NOTE — Assessment & Plan Note (Signed)
Historically intolerant of statins Working on diet and exercise to control same [place] labs semiannually with cardiology Continue management of same

## 2013-09-11 ENCOUNTER — Encounter: Payer: Self-pay | Admitting: Family Medicine

## 2013-09-11 ENCOUNTER — Ambulatory Visit (INDEPENDENT_AMBULATORY_CARE_PROVIDER_SITE_OTHER): Payer: Medicare Other | Admitting: Family Medicine

## 2013-09-11 VITALS — BP 130/70 | HR 75 | Ht 61.0 in | Wt 157.0 lb

## 2013-09-11 DIAGNOSIS — M47818 Spondylosis without myelopathy or radiculopathy, sacral and sacrococcygeal region: Secondary | ICD-10-CM | POA: Insufficient documentation

## 2013-09-11 DIAGNOSIS — M461 Sacroiliitis, not elsewhere classified: Secondary | ICD-10-CM

## 2013-09-11 DIAGNOSIS — M47817 Spondylosis without myelopathy or radiculopathy, lumbosacral region: Secondary | ICD-10-CM

## 2013-09-11 NOTE — Assessment & Plan Note (Signed)
Patient was given another injection today under ultrasound. Patient tolerated the procedure well with near complete resolution of pain. Encourage her to continue the exercises as well as the over-the-counter medications we discussed previously. We went over this with her caregiver again in great detail. Patient will come back again on an as-needed basis. They do we can do the injections every 3-4 months as necessary.  Spent greater than 25 minutes with patient face-to-face and had greater than 50% of counseling including as described above in assessment and plan.

## 2013-09-11 NOTE — Progress Notes (Signed)
CC: Back pain followup  HPI: Patient is a very pleasant 80 year old female coming in with chronic back pain. Patient was seen previously by me greater than 6 months ago and did have an injection in the right SI joint. Patient states that this pain was considerably better until the last month. Patient states that the pain has started to slowly start to come back. Patient found it difficult to stand up straight or even start to ambulate secondary to the pain again. Patient states it feels very similar to what it was prior to with no significant new symptoms. Denies any radiation down the legs but states that she becomes weaker with increasing activity. Denies any nighttime awakening.   Past medical, surgical, family and social history reviewed. Medications reviewed all in the electronic medical record.   Review of Systems: No headache, visual changes, nausea, vomiting, diarrhea, constipation, dizziness, abdominal pain, skin rash, fevers, chills, night sweats, weight loss, swollen lymph nodes, body aches, joint swelling, muscle aches, chest pain, shortness of breath, mood changes.   Objective:    Blood pressure 130/70, pulse 75, height 5\' 1"  (1.549 m), weight 157 lb (71.215 kg).   General: No apparent distress alert and oriented x3 mood and affect normal, dressed appropriately.  HEENT: Pupils equal, extraocular movements intact Respiratory: Patient's speak in full sentences and does not appear short of breath Cardiovascular: No lower extremity edema, non tender, no erythema Skin: Warm dry intact with no signs of infection or rash on extremities or on axial skeleton. Abdomen: Soft nontender Neuro: Cranial nerves II through XII are intact, neurovascularly intact in all extremities with 2+ DTRs and 2+ pulses. Lymph: No lymphadenopathy of posterior or anterior cervical chain or axillae bilaterally.  Gait walks with a cane mild broad-based gait MSK: Non tender with full range of motion and good  stability and symmetric strength and tone of shoulders, elbows, wrist, hip, knee and ankles bilaterally.  Back Exam:  Inspection:increase kyphosis as well as some scoliosis Motion: Flexion 35 deg, Extension 25 deg, Side Bending to 25 deg bilaterally,  Rotation to 25 deg bilaterally  SLR laying: pain but no radiation  XSLR laying: Negative  Palpable tenderness: right SI joint and severe FABER: unable for patient to participate. Sensory change: Gross sensation intact to all lumbar and sacral dermatomes.  Reflexes: 2+ at both patellar tendons, 2+ at achilles tendons, Babinski's downgoing.  Strength at foot  Plantar-flexion: 5/5 Dorsi-flexion: 5/5 Eversion: 5/5 Inversion: 5/5  Leg strength  Quad: 4/5 Hamstring: 4/5 Hip flexor: 4/5 Hip abductors: 3/5  But symmetric to contralateral side.   Previous imaging studies XR lumbar spine 06/07/2012: Findings: There is curvature convex to the left. There is advanced degenerative disc disease at T12-L1, L1-2 and L4-5. The L5-S1 level is transitional. There is anterolisthesis of 3 mm of L4-5.  There is retrolisthesis of 3 mm at T12-L1 and L1-2. These degenerative changes have worsened since 2010. No evidence of fracture.  IMPRESSION:  Curvature convex to the left. Worsening of degenerative disc disease at T12-L1, L1-2 and L4-5. See above for complete discussion.   Procedure  After verbal consent patient was prepped with alcohol swabs and with a 23-gauge 1-1/2 inch needle patient was injected with 1 cc of Kenalog 0.5% Marcaine and 1 cc of Kenalog 40 mg/dL into the right SI joint under ultrasound guidance. Patient did tolerate the procedure well. Pictures were saved. Patient did have minimal bleeding. Placed Band-Aid over injection site. Post injection instructions  Impression and Recommendations:  This case required medical decision making of moderate complexity.

## 2013-09-11 NOTE — Patient Instructions (Signed)
You are doing great! Cherries, Spinach, pineapple and kale. Continue other medications  Exercises 2-3 times a week See me when you need me.

## 2013-09-27 ENCOUNTER — Telehealth: Payer: Self-pay | Admitting: Internal Medicine

## 2013-10-02 ENCOUNTER — Telehealth: Payer: Self-pay

## 2013-10-02 NOTE — Telephone Encounter (Signed)
Reauthorization received on 10/02/2013  Called Ins to start reauthorization.   Ins. Stated that it is non formulary. Faxing PA form to office

## 2013-10-03 NOTE — Telephone Encounter (Signed)
Received call from insurance. PA on pt Halcion has been approved. Pls notify pharmacy...[name]

## 2013-10-03 NOTE — Telephone Encounter (Signed)
PA for Halcion approved

## 2013-10-03 NOTE — Telephone Encounter (Signed)
Informed Pharm that PA for Halcion was approved

## 2013-10-03 NOTE — Telephone Encounter (Signed)
PA completed and sent back to [facility]

## 2013-10-05 ENCOUNTER — Other Ambulatory Visit (INDEPENDENT_AMBULATORY_CARE_PROVIDER_SITE_OTHER): Payer: Medicare Other

## 2013-10-05 ENCOUNTER — Ambulatory Visit (INDEPENDENT_AMBULATORY_CARE_PROVIDER_SITE_OTHER): Payer: Medicare Other | Admitting: Internal Medicine

## 2013-10-05 ENCOUNTER — Encounter: Payer: Self-pay | Admitting: Internal Medicine

## 2013-10-05 VITALS — BP 148/80 | HR 92 | Temp 98.4°F | Wt 149.6 lb

## 2013-10-05 DIAGNOSIS — R413 Other amnesia: Secondary | ICD-10-CM | POA: Diagnosis not present

## 2013-10-05 DIAGNOSIS — E871 Hypo-osmolality and hyponatremia: Secondary | ICD-10-CM | POA: Diagnosis not present

## 2013-10-05 DIAGNOSIS — E538 Deficiency of other specified B group vitamins: Secondary | ICD-10-CM | POA: Diagnosis not present

## 2013-10-05 DIAGNOSIS — W19XXXA Unspecified fall, initial encounter: Secondary | ICD-10-CM

## 2013-10-05 LAB — CBC WITH DIFFERENTIAL/PLATELET
BASOS ABS: 0 10*3/uL (ref 0.0–0.1)
Basophils Relative: 0.3 % (ref 0.0–3.0)
EOS ABS: 0.1 10*3/uL (ref 0.0–0.7)
Eosinophils Relative: 1.7 % (ref 0.0–5.0)
HCT: 36.9 % (ref 36.0–46.0)
Hemoglobin: 12.5 g/dL (ref 12.0–15.0)
Lymphocytes Relative: 30 % (ref 12.0–46.0)
Lymphs Abs: 1.6 10*3/uL (ref 0.7–4.0)
MCHC: 33.9 g/dL (ref 30.0–36.0)
MCV: 93.6 fl (ref 78.0–100.0)
Monocytes Absolute: 0.5 10*3/uL (ref 0.1–1.0)
Monocytes Relative: 9 % (ref 3.0–12.0)
NEUTROS PCT: 59 % (ref 43.0–77.0)
Neutro Abs: 3.1 10*3/uL (ref 1.4–7.7)
Platelets: 286 10*3/uL (ref 150.0–400.0)
RBC: 3.94 Mil/uL (ref 3.87–5.11)
RDW: 13.5 % (ref 11.5–15.5)
WBC: 5.2 10*3/uL (ref 4.0–10.5)

## 2013-10-05 MED ORDER — CYANOCOBALAMIN 1000 MCG/ML IJ SOLN
1000.0000 ug | Freq: Once | INTRAMUSCULAR | Status: AC
  Administered 2013-10-05: 1000 ug via INTRAMUSCULAR

## 2013-10-05 NOTE — Patient Instructions (Signed)
Your next office appointment will be determined based upon review of your pending labs. Those instructions will be transmitted to you through My Chart . 

## 2013-10-05 NOTE — Progress Notes (Signed)
Pre visit review using our clinic review tool, if applicable. No additional management support is needed unless otherwise documented below in the visit note. 

## 2013-10-05 NOTE — Assessment & Plan Note (Signed)
B12 level 

## 2013-10-05 NOTE — Progress Notes (Signed)
Subjective:    Patient ID: Sarah [name], female    DOB: Apr 02, 1926, 80 y.o.   MRN: [id number]  HPI  She fell 6/70/15 when she lost her balance in the setting of carrying items to the table while ambulating with her rolling walker. She struck her shoulder and fell to the carpeted floor hitting the back of her head. There was no loss of consciousness. Her home health aid monitored her  for several hours and noted no mental status changes or significant musculoskeletal injury.  There was no cardiac or neurologic prodrome prior to the fall.  She is on no blood thinners. She has no symptoms of bleeding dyscrasia.  Earlier this week she sounded confused on the phone according to her daughter. She complained of headache over the crown which resolves with Tylenol. She also had weakness in her legs ;felt " shaky" & also described a metallic taste  She has had intermittent memory issues such as inability to correctly name the birth date of her son.  She continues to state that foods don't "taste right".  In reviewing the chart her last labs were 3/15. That time her hemoglobin was 12  & hematocrit 35.9. TSH was 4.27. Sodium was reduced at 131.She had mild hyperglycemia but her last A1c was 5.9% in October 2014.Her last B12 level was 426 in September 2014.     Review of Systems  Denied were any change in heart rhythm or rate prior to the event. There was no associated chest pain or shortness of breath . Also specifically denied prior to the episode were headache, limb weakness, tingling, or numbness. No seizure activity noted. She denies epistaxis, hemoptysis, hematuria, melena, or rectal bleeding. She has no unexplained weight loss, dysphagia, or abdominal pain. No persistently small caliber stools  She has no abnormal bruising or bleeding. There is no difficulty stopping bleeding with injury.  She denies dysuria, pyuria, hematuria, frequency, nocturia or polyuria. She has been on VESIcare to  prevent incontinence.       Objective:   Physical Exam  Significant or distinguishing  findings on physical exam are documented first.  Below that are other systems examined & findings.  She is very interactive and quite humorous, teasing me intermittently. Her hair is very fine. Seborrheic keratotic lesions are noted of the scalp There is increased cerumen  noted on the left otic canal w/o significant hearing loss. She identified the date as July? ,2014 but corrected herself . She correctly recalled her daughter's birthday but had difficulty initially. She's using rolling walker and ambulates very slowly and deliberately. Mild OA hand changes. Minor bruise R forearm   General appearance is one of good health and nourishment w/o distress. Eyes: No conjunctival inflammation or scleral icterus is present. Oral exam: Dental hygiene is good; lips and gums are healthy appearing.There is no oropharyngeal erythema or exudate noted.  Heart:  Normal rate and regular rhythm. S1 and S2 normal without gallop, murmur, click, rub or other extra sounds   Lungs:Chest clear to auscultation; no wheezes, rhonchi,rales ,or rubs present.No increased work of breathing.  Abdomen: bowel sounds normal, soft and non-tender without masses, organomegaly or hernias noted.  No guarding or rebound . No tenderness over the flanks to percussion Skin:Warm & dry.  Intact without suspicious lesions or rashes ; no jaundice or tenting Lymphatic: No lymphadenopathy is noted about the head, neck, axilla             Assessment & Plan:  #1  recent memory changes; metabolic rather than post traumatic etiology suggested #2 history of fall 09/12/13 without definite sequelae at that time. Significant head trauma not suggested by hx #3 B12 deficiency #4 hyponatremia, PMH of  Plan: See orders and recommendations. If labs are non diagnostic; CT scan will be performed.

## 2013-10-09 ENCOUNTER — Other Ambulatory Visit: Payer: Self-pay | Admitting: Internal Medicine

## 2013-10-09 ENCOUNTER — Encounter: Payer: Self-pay | Admitting: Neurology

## 2013-10-09 ENCOUNTER — Ambulatory Visit (INDEPENDENT_AMBULATORY_CARE_PROVIDER_SITE_OTHER): Payer: Medicare Other | Admitting: Neurology

## 2013-10-09 VITALS — BP 140/70 | HR 93 | Ht 60.63 in | Wt 153.0 lb

## 2013-10-09 DIAGNOSIS — M47817 Spondylosis without myelopathy or radiculopathy, lumbosacral region: Secondary | ICD-10-CM

## 2013-10-09 DIAGNOSIS — E871 Hypo-osmolality and hyponatremia: Secondary | ICD-10-CM

## 2013-10-09 DIAGNOSIS — G20 Parkinson's disease: Secondary | ICD-10-CM

## 2013-10-09 DIAGNOSIS — R269 Unspecified abnormalities of gait and mobility: Secondary | ICD-10-CM

## 2013-10-09 DIAGNOSIS — T7589XS Other specified effects of external causes, sequela: Secondary | ICD-10-CM | POA: Diagnosis not present

## 2013-10-09 DIAGNOSIS — R2681 Unsteadiness on feet: Secondary | ICD-10-CM

## 2013-10-09 DIAGNOSIS — W19XXXS Unspecified fall, sequela: Secondary | ICD-10-CM

## 2013-10-09 LAB — BASIC METABOLIC PANEL
BUN: 16 mg/dL (ref 6–23)
CALCIUM: 8.7 mg/dL (ref 8.4–10.5)
CO2: 27 meq/L (ref 19–32)
CREATININE: 0.7 mg/dL (ref 0.4–1.2)
Chloride: 94 mEq/L — ABNORMAL LOW (ref 96–112)
GFR: 83.13 mL/min (ref >60.00)
Glucose, Bld: 114 mg/dL — ABNORMAL HIGH (ref 70–99)
Potassium: 4.5 mEq/L (ref 3.5–5.1)
Sodium: 129 mEq/L — ABNORMAL LOW (ref 135–145)

## 2013-10-09 LAB — VITAMIN B12: Vitamin B-12: >1500 pg/mL — ABNORMAL HIGH (ref 211–911)

## 2013-10-09 LAB — TSH: TSH: 4.91 u[IU]/mL — ABNORMAL HIGH (ref 0.35–4.50)

## 2013-10-09 NOTE — Patient Instructions (Signed)
CT head without contrast Increase salt intake and follow-up with Dr. Asa Lente for low sodium

## 2013-10-09 NOTE — Progress Notes (Signed)
[facility]  Follow-up Visit   Date: 10/09/2013   Sarah [name] MRN: [id number] DOB: 05-12-1926   Interim History: Sarah [name] is a 80 y.o. right-handed Caucasian female with history of HTN, HPL, depression, esophageal strictures, and GERD returning to the clinic for follow-up of leg weaknes.  The patient was accompanied to the clinic by [name], her caregiver.  She was last seen in the office on 07/10/2013.   History of present illness: Leg weakness started slowly around May 2014 when she noticed difficulty with walking and tripping on things. She has a hard time describing her walking problems, but says that her legs feel weak. She would stumble frequently, and denies any falls.  She has associated bilateral achy knee pain which feels tight and is worse when she applies pressure on it. Her symptoms steadily worsened. She reports having chronic back pain but had worsening pain in August 2014. Pain is achy located at the low back and does not radiate into her buttocks or legs. Forward flexion makes her pain worse and it is improved by laying down.   Follow-up 01/16/2013:  Clinically unchanged.  Neuropathy labs which all returned normal.  EMG was performed and shows moderate peripheral neuropathy of the legs and L3-L4 old radiculopathy.   Follow-up 04/18/2013:  In December, she saw Dr. Tamala Julian for back pain who found SI joint dysfunction and performed injection which some benefit. Because of ongoing leg weakness, she started using a cane in November 2014 and feels much more stable.  She also has a caregiver that comes 10-2p who helps her dress, prepare meals, and does her errands which she appreciates a lot.    - Follow 07/10/2013:  She has completed physical therapy and reports significant improvement, because she is able to lift her legs and a band much easier. Her caregiver encourages her to continue her home exercises.  She also tells me that the past few months  have been very difficult because she has had ongoing problems losing 6 teeth and is scheduled to have implants. She does complain of taking naps during the day, lasting 45 minutes. She feels that since the time change she has never been able to catch up on her sleep. Mood is good and she is excited about an upcoming trip with her family in May.  UPDATE 10/09/2013:  She had an unwittnessed fall on 6/17 while transferring a few things to her dining table.  She hit the back of her head and bruised her right wrist.  There was no loss of consciousness.  Her caregiver continues to monitor her for several hours after the fall.  She remained stable.  Caregiver says that she is more forgetful than usual, especially with conversation.  Since her fall, she has had intermittent headaches, described as achy, occuring daily and last all day. It usually responds to tylenol.     Medications:  Current Outpatient Prescriptions on File Prior to Visit  Medication Sig Dispense Refill  . acetaminophen (TYLENOL) 500 MG tablet Take 1 tablet (500 mg total) by mouth every 8 (eight) hours as needed.  180 tablet  2  . ALPRAZolam (XANAX) 1 MG tablet Take 1 tablet (1 mg total) by mouth 2 (two) times daily as needed.  60 tablet  5  . aspirin 81 MG tablet Take 81 mg by mouth daily.      . Cholecalciferol (VITAMIN D PO) Take by mouth. Taking 2000 daily       .  clobetasol ointment (TEMOVATE) 2.22 % Apply 1 application topically 2 (two) times daily.       . cyanocobalamin (,VITAMIN B-12,) 1000 MCG/ML injection Inject 1 mL (1,000 mcg total) into the muscle every 30 (thirty) days.  1 mL  3  . furosemide (LASIX) 20 MG tablet Take 1 tablet (20 mg total) by mouth as needed.  30 tablet  5  . Glucosamine HCl 750 MG TABS Take by mouth 2 (two) times daily.      Marland Kitchen ibandronate (BONIVA) 150 MG tablet Take 1 tablet (150 mg total) by mouth every 30 (thirty) days.  1 tablet  5  . miconazole (MONISTAT 7) 100 MG vaginal suppository Place 1 suppository  (100 mg total) vaginally at bedtime.  7 suppository  0  . Misc Natural Products (TUMERSAID) TABS Take 1 capsule by mouth 2 (two) times daily.  60 tablet  3  . naproxen sodium (ALEVE) 220 MG tablet Take 220 mg by mouth 3 (three) times daily with meals.      . nitroGLYCERIN (NITROSTAT) 0.4 MG SL tablet Place 1 tablet (0.4 mg total) under the tongue every 5 (five) minutes as needed for chest pain.  100 tablet  prn  . nortriptyline (PAMELOR) 25 MG capsule TAKE 1 CAPSULE AT BEDTIME.  30 capsule  5  . pantoprazole (PROTONIX) 40 MG tablet TAKE 1 TABLET ONCE DAILY.  30 tablet  3  . perphenazine (TRILAFON) 2 MG tablet TAKE ONE TABLET AT BEDTIME.  30 tablet  5  . solifenacin (VESICARE) 10 MG tablet Take 1 tablet (10 mg total) by mouth as needed.  30 tablet  5  . triazolam (HALCION) 0.25 MG tablet Take 0.5-1 tablets (0.125-0.25 mg total) by mouth at bedtime as needed.  30 tablet  5  . [DISCONTINUED] solifenacin (VESICARE) 10 MG tablet Take 10 mg by mouth as needed.        No current facility-administered medications on file prior to visit.    Allergies:  Allergies  Allergen Reactions  . Celebrex [Celecoxib]     rash  . Lipitor [Atorvastatin Calcium]     Leg weakness,memory loss  . Lovastatin     Leg weakness  . Other     Always pads  . Latex Rash     Review of Systems:  CONSTITUTIONAL: No fevers, chills, night sweats, or weight loss.   EYES: No visual changes or eye pain ENT: No hearing changes.  No history of nose bleeds.  + Difficulty swallowing RESPIRATORY: No cough, wheezing and shortness of breath.   CARDIOVASCULAR: Negative for chest pain, and palpitations.   GI: Negative for abdominal discomfort, blood in stools or black stools.  No recent change in bowel habits.  + Esophageal stricture GU:  No history of incontinence.   MUSCLOSKELETAL: No history of joint pain or swelling.  No myalgias.  + back pain SKIN: Negative for lesions, rash, and itching.   HEMATOLOGY/ONCOLOGY: Negative for  prolonged bleeding, bruising easily, and swollen nodes.   ENDOCRINE: Negative for cold or heat intolerance, polydipsia or goiter.   PSYCH:  No depression or anxiety symptoms.   NEURO: As Above.   Vital Signs:  BP 140/70  Pulse 93  Ht 5' 0.63" (1.54 m)  Wt 153 lb (69.4 kg)  BMI 29.26 kg/m2  SpO2 97%   Neurological Exam: MENTAL STATUS including orientation to time, place, person, recent and remote memory, attention span and concentration, language, and fund of knowledge is fair.  She seems to be distracted and  forgets where she is trying to say.  Speech is not dysarthric. Slightly blunted affect.   CRANIAL NERVES:  Visual fields are intact throughout.  Pupils round and reactive to light. Extraocular muscles are intact. Face is symmetric. Palate elevates symmetrically and tongue is midline.  MOTOR:  Motor strength is 5/5 in all extremities, except 5-/5 quadriceps and toe extensors bilaterally. Tone is normal throughout.  MSRs:  Right                                                                 Left brachioradialis 2+  brachioradialis 2+  biceps 2+  biceps 2+  triceps 2+  triceps 2+  patellar 1+  patellar 1+  ankle jerk 0  ankle jerk 0  Hoffman no  Hoffman no   SENSORY:  Absent vibration distal to ankles bilaterally.    COORDINATION/GAIT:  Finger tapping is reduced in amplitude and speed bilaterally. She is unable to rise from a chair without using arms.  Stooped posture, small steps, gait narrow based and stable. She turns and 6 steps.  Data: XR lumbar spine 06/07/2012:  Curvature convex to the left. Worsening of degenerative disc disease at T12-L1, L1-2 and L4-5.   Labs 12/06/2012:  TSH 5.43 Labs 12/15/2012:  Vitamin B12 426, MMA 0.22, ceruloplasmin 29, copper 114 SPEP/UPEP with IFE - no monoclonal gammopathy Labs 01/16/2013:  HbA1c 5.9   EMG 01/15/2013:  Taken together, these findings are consistent with a moderate generalized, large fiber, sensorimotor polyneuropathy  affecting the left side. There is a superimposed intraspinal canal lesion (i.e. radiculopathy) affecting the L2-L4 nerve root/segments bilaterally, moderate in degree electrically.   IMPRESSION/PLAN: 1.  Unprovoked fall with worsening headaches, memory loss, and gait instability Given that she has not returned to her baseline and her headaches are not improving, I would like to obtain CT of the head to rule out subdural hematoma.  She can continue Tylenol for pain relief which seems to be working  2.  Multifactorial gait instability There are several factors contributing to her gait instability including degenerative arthritis of the lumbosacral region, peripheral neuropathy, and likely neurodegenerative process.  She has multilevel degenerative arthritis and lumbosacral radiculopathy for which she completed physical therapy earlier this year and has improved her proximal leg strength. Her neuropathy seems to be mild and I do not feel that it is largely contributing to her gait instability.  I discussed with the patient that with her recent unprovoked fall and clinical features suggestive of parkinsonism  (blunted affect, slightly slowed movements, stooped posture with shuffling gait), I would like to offer her a trial of Sinemet.  She prefers to hold off on starting medications until her imaging has been done.  3.  Dementia, likely age related and exacerbated by her recent fall  Consider starting anticholinergic medication going forward  4.  Parkinsonism  It is possible bradykinesia and gait instability contributed to her fall. I have discussed the potential of this diagnosis and I would like to offer her a trial of Sinemet after her CT brain.  There is no tremor or rigidity on exam.  5. Hyponatremia  Encouraged her to increase her salt intake and follow up with her primary care physician   6.  Return to clinic in 3-4 week  The duration of this appointment visit was 40 minutes of face-to-face  time with the patient.  Greater than 50% of this time was spent in counseling, explanation of diagnosis, planning of further management, and coordination of care.   Thank you for allowing me to participate in patient's care.  If I can answer any additional questions, I would be pleased to do so.    Sincerely,    Donika K. Posey Pronto, DO

## 2013-10-10 ENCOUNTER — Telehealth: Payer: Self-pay | Admitting: Neurology

## 2013-10-10 ENCOUNTER — Telehealth: Payer: Self-pay | Admitting: *Deleted

## 2013-10-10 NOTE — Telephone Encounter (Signed)
Re-scheduled patient's CT appointment to [facility] per her request.  Appointment is on Monday, July 20 at 11:45.  Left message for her to call me back.

## 2013-10-10 NOTE — Telephone Encounter (Signed)
Calling for Caryl Pina, please call / Sherri S.

## 2013-10-10 NOTE — Telephone Encounter (Signed)
Patient and Sarah [name] (caregiver) notified that appointment is on Monday, July 20 at 11:45 at [facility].

## 2013-10-12 ENCOUNTER — Ambulatory Visit (HOSPITAL_COMMUNITY): Payer: Medicare Other

## 2013-10-15 ENCOUNTER — Ambulatory Visit
Admission: RE | Admit: 2013-10-15 | Discharge: 2013-10-15 | Disposition: A | Discharge status: Home / Self Care | Payer: Medicare Other | Source: Ambulatory Visit | Attending: Neurology | Admitting: Neurology

## 2013-10-15 ENCOUNTER — Other Ambulatory Visit: Payer: Self-pay | Admitting: Neurology

## 2013-10-15 DIAGNOSIS — S0990XA Unspecified injury of head, initial encounter: Secondary | ICD-10-CM | POA: Diagnosis not present

## 2013-10-15 DIAGNOSIS — R2681 Unsteadiness on feet: Secondary | ICD-10-CM

## 2013-10-26 ENCOUNTER — Other Ambulatory Visit: Payer: Self-pay | Admitting: Internal Medicine

## 2013-11-02 ENCOUNTER — Telehealth: Payer: Self-pay | Admitting: Neurology

## 2013-11-02 NOTE — Telephone Encounter (Signed)
I spoke with patient and she said that someone had left her a message about changing her appointment.  Informed her that it was no one here and instructed her to keep her appointment on the 25th.  Patient agreed with plan.

## 2013-11-02 NOTE — Telephone Encounter (Signed)
Pt needs to talk with Caryl Pina please call [phone]

## 2013-11-05 ENCOUNTER — Encounter: Payer: Self-pay | Admitting: Podiatry

## 2013-11-05 ENCOUNTER — Ambulatory Visit: Payer: Medicare Other | Admitting: Podiatry

## 2013-11-05 DIAGNOSIS — M779 Enthesopathy, unspecified: Secondary | ICD-10-CM

## 2013-11-05 DIAGNOSIS — L600 Ingrowing nail: Secondary | ICD-10-CM

## 2013-11-05 DIAGNOSIS — L84 Corns and callosities: Secondary | ICD-10-CM

## 2013-11-05 NOTE — Patient Instructions (Signed)

## 2013-11-05 NOTE — Progress Notes (Signed)
Subjective:     Patient ID: Sarah [name], female   DOB: Feb 18, 1927, 80 y.o.   MRN: [id number]  HPI patient presents with caregiver stating that her right foot and ankle been hurting the lesion on the right first metatarsal has been hurting and the ingrown on the left big toe lateral side has been giving her some trouble   Review of Systems     Objective:   Physical Exam Neurovascular status is intact with digits well perfused and I noted exquisite discomfort in the sinus tarsi right keratotic lesion the right side of the first metatarsal and incurvated nailbed left hallux lateral border that is painful and thickness of the nailbed is noted    Assessment:     Chronic sinus tarsitis right with inflammatory changes keratotic lesion right and ingrown toenail left    Plan:     Reviewed all conditions and today injected the sinus tarsi right 3 mg Kenalog 5 mg I can Marcaine mixture debrider the lesion on the right and for the left recommended removal of corner explained I will have to take more nail and eventually she may lose the entire nail. Patient wants procedure and today I infiltrated 60 mg I can Marcaine mixture remove the lateral border exposed matrix and apply chemical 3 applications 30 seconds followed by alcohol lavaged and sterile dressing. Reappoint her recheck

## 2013-11-06 ENCOUNTER — Ambulatory Visit: Payer: Medicare Other

## 2013-11-06 ENCOUNTER — Telehealth: Payer: Self-pay | Admitting: Podiatry

## 2013-11-06 ENCOUNTER — Telehealth: Payer: Self-pay | Admitting: *Deleted

## 2013-11-06 NOTE — Telephone Encounter (Signed)
PT STATES SHE IS IN A LOT OF PAIN- SHE STATES SHE HAS CALLED THREE TIMES AND WOULD LIKE SOMEONE TO CALL HER. THANKS GUYS

## 2013-11-06 NOTE — Telephone Encounter (Signed)
In excruciating pain.  I haven't heard a word from anybody and it's getting old.  Shot not helping one bit!

## 2013-11-06 NOTE — Telephone Encounter (Signed)
I returned her call and asked her if she has soaked her foot today.  She stated she has.  I told her it's okay to take the [place].  She stated it's not Darvocet, it had expired.  She said, it's Tramadol.  I told her if she feel like she needs it, it's okay to take it.  She said, that's not my main concern, it's my other foot he gave me the shot in.  My foot wakes me up every morning around 5am hurting.  What can I do about it?  I told her she needs to give the Cortisone injection time to see if it will help.  She stated okay, I will.  Thank you for calling me back and answering my questions.

## 2013-11-06 NOTE — Telephone Encounter (Signed)
Came in yesterday.  The right foot started to hurt at 5pm as usual. The toe he too the nail off is killing me.  I been taking Extra Strength Tylenol.  I have some Darvocet.  Is it okay to take it?  I don't like to take it unless I have to.  I need his advice.

## 2013-11-07 NOTE — Telephone Encounter (Signed)
Call pt and see if she wants to be seen. We can schedule an appointment for her

## 2013-11-09 ENCOUNTER — Telehealth: Payer: Self-pay | Admitting: *Deleted

## 2013-11-09 NOTE — Telephone Encounter (Signed)
Entered in error under wrong name

## 2013-11-09 NOTE — Telephone Encounter (Signed)
Dr. Josephina Shih a prescription for Tramadol 50 mg.  Take 1 by mouth every 4 hours as needed for pain, quantity of 30.

## 2013-11-13 ENCOUNTER — Telehealth: Payer: Self-pay | Admitting: *Deleted

## 2013-11-13 NOTE — Telephone Encounter (Signed)
He trimmed my toenail first of the week.  Been doing what he told me to do.  My big toe is still swollen.  I wanted to know if that's normal?  Call me back before you leave today.  I called and left her a message to call and let me know how her foot is doing.  It is normal for it to still be swollen.

## 2013-11-13 NOTE — Telephone Encounter (Signed)
Patient returned my call.  She stated my toe is doing fine.  It was swollen over the weekend but it's gone down.  I told her good.  Let us know if you have any more concerns.  She stated okay I will.

## 2013-11-20 ENCOUNTER — Ambulatory Visit (INDEPENDENT_AMBULATORY_CARE_PROVIDER_SITE_OTHER): Payer: Medicare Other | Admitting: Neurology

## 2013-11-20 ENCOUNTER — Encounter: Payer: Self-pay | Admitting: Neurology

## 2013-11-20 VITALS — BP 130/74 | HR 94 | Ht 61.0 in | Wt 155.6 lb

## 2013-11-20 DIAGNOSIS — R269 Unspecified abnormalities of gait and mobility: Secondary | ICD-10-CM

## 2013-11-20 DIAGNOSIS — M47817 Spondylosis without myelopathy or radiculopathy, lumbosacral region: Secondary | ICD-10-CM | POA: Diagnosis not present

## 2013-11-20 DIAGNOSIS — E538 Deficiency of other specified B group vitamins: Secondary | ICD-10-CM

## 2013-11-20 DIAGNOSIS — R2681 Unsteadiness on feet: Secondary | ICD-10-CM

## 2013-11-20 MED ORDER — CYANOCOBALAMIN 1000 MCG/ML IJ SOLN
1000.0000 ug | Freq: Once | INTRAMUSCULAR | Status: AC
  Administered 2013-11-20: 1000 ug via INTRAMUSCULAR

## 2013-11-20 NOTE — Patient Instructions (Signed)
It was great to see you today. For your feet discomfort, try using icy hot at bedtime nightly. Please follow-up with Dr. Asa Lente regarding urinary symptoms

## 2013-11-20 NOTE — Progress Notes (Signed)
[facility]  Follow-up Visit   Date: 11/20/2013   Sarah [name] MRN: [id number] DOB: 05/21/1926   Interim History: Sarah [name] is a 80 y.o. right-handed Caucasian female with history of HTN, HPL, depression, esophageal strictures, and GERD returning to the clinic for follow-up of leg weaknes.  The patient was accompanied to the clinic by [name], her caregiver.    History of present illness: Leg weakness started slowly around May 2014 when she noticed difficulty with walking and tripping on things. She has a hard time describing her walking problems, but says that her legs feel weak. She would stumble frequently, and denies any falls.  She has associated bilateral achy knee pain which feels tight and is worse when she applies pressure on it. Her symptoms steadily worsened. She reports having chronic back pain but had worsening pain in August 2014. Pain is achy located at the low back and does not radiate into her buttocks or legs. Forward flexion makes her pain worse and it is improved by laying down.   Follow-up 01/16/2013:  Neuropathy labs which all returned normal.  EMG was performed and shows moderate peripheral neuropathy of the legs and L3-L4 old radiculopathy.   Follow-up 04/18/2013:  In December, she saw Dr. Tamala Julian for back pain who found SI joint dysfunction and performed injection which some benefit. Because of ongoing leg weakness, she started using a cane in November 2014 and feels much more stable.  She also has a caregiver that comes 10-2p who helps her dress, prepare meals, and does her errands which she appreciates a lot.    - Follow 07/10/2013:  She has completed physical therapy and reports significant improvement, because she is able to lift her legs much easier.   UPDATE 10/09/2013:  She had an unwittnessed fall on 6/17 while transferring a few things to her dining table.  She hit the back of her head and bruised her right wrist.  There was no  loss of consciousness. Caregiver says that she is more forgetful than usual, especially with conversation.  Since her fall, she has had intermittent headaches, described as achy, occuring daily and last all day. It usually responds to tylenol.    UPDATE 11/20/2013:  She developed severe right foot pain and she podiatry who injected her foot, which helped slightly. She applies icey hot to her feet which helps. She continues to be frustrated for her teeth implant.  No interval falls and headaches have resolved.  Gait is a little difficult because of feet pain.  She also complains of urinary urgency, especially at night.   Medications:  Current Outpatient Prescriptions on File Prior to Visit  Medication Sig Dispense Refill  . acetaminophen (TYLENOL) 500 MG tablet Take 1 tablet (500 mg total) by mouth every 8 (eight) hours as needed.  180 tablet  2  . ALPRAZolam (XANAX) 1 MG tablet Take 1 tablet (1 mg total) by mouth 2 (two) times daily as needed.  60 tablet  5  . aspirin 81 MG tablet Take 81 mg by mouth daily.      . Cholecalciferol (VITAMIN D PO) Take by mouth. Taking 2000 daily       . clobetasol ointment (TEMOVATE) 7.78 % Apply 1 application topically 2 (two) times daily.       . cyanocobalamin (,VITAMIN B-12,) 1000 MCG/ML injection Inject 1 mL (1,000 mcg total) into the muscle every 30 (thirty) days.  1 mL  3  . furosemide (LASIX) 20 MG  tablet Take 1 tablet (20 mg total) by mouth as needed.  30 tablet  5  . Glucosamine HCl 750 MG TABS Take by mouth 2 (two) times daily.      Marland Kitchen ibandronate (BONIVA) 150 MG tablet Take 1 tablet (150 mg total) by mouth every 30 (thirty) days.  1 tablet  5  . miconazole (MONISTAT 7) 100 MG vaginal suppository Place 1 suppository (100 mg total) vaginally at bedtime.  7 suppository  0  . Misc Natural Products (TUMERSAID) TABS Take 1 capsule by mouth 2 (two) times daily.  60 tablet  3  . naproxen sodium (ALEVE) 220 MG tablet Take 220 mg by mouth 3 (three) times daily with  meals.      . nitroGLYCERIN (NITROSTAT) 0.4 MG SL tablet Place 1 tablet (0.4 mg total) under the tongue every 5 (five) minutes as needed for chest pain.  100 tablet  prn  . nortriptyline (PAMELOR) 25 MG capsule TAKE 1 CAPSULE AT BEDTIME.  30 capsule  5  . pantoprazole (PROTONIX) 40 MG tablet TAKE 1 TABLET ONCE DAILY.  30 tablet  3  . perphenazine (TRILAFON) 2 MG tablet TAKE ONE TABLET AT BEDTIME.  30 tablet  5  . solifenacin (VESICARE) 10 MG tablet Take 1 tablet (10 mg total) by mouth as needed.  30 tablet  5  . triazolam (HALCION) 0.25 MG tablet Take 0.5-1 tablets (0.125-0.25 mg total) by mouth at bedtime as needed.  30 tablet  5  . [DISCONTINUED] solifenacin (VESICARE) 10 MG tablet Take 10 mg by mouth as needed.        No current facility-administered medications on file prior to visit.    Allergies:  Allergies  Allergen Reactions  . Celebrex [Celecoxib]     rash  . Lipitor [Atorvastatin Calcium]     Leg weakness,memory loss  . Lovastatin     Leg weakness  . Other     Always pads  . Latex Rash     Review of Systems:  CONSTITUTIONAL: No fevers, chills, night sweats, or weight loss.   EYES: No visual changes or eye pain ENT: No hearing changes.  No history of nose bleeds. RESPIRATORY: No cough, wheezing and shortness of breath.   CARDIOVASCULAR: Negative for chest pain, and palpitations.   GI: Negative for abdominal discomfort, blood in stools or black stools.  No recent change in bowel habits.  GU:  No history of incontinence.   MUSCLOSKELETAL: No history of joint pain or swelling.  No myalgias.  + back pain SKIN: Negative for lesions, rash, and itching.   HEMATOLOGY/ONCOLOGY: Negative for prolonged bleeding, bruising easily, and swollen nodes.   ENDOCRINE: Negative for cold or heat intolerance, polydipsia or goiter.   PSYCH:  No depression or anxiety symptoms.   NEURO: As Above.   Vital Signs:  BP 130/74  Pulse 94  Ht 5\' 1"  (1.549 m)  Wt 155 lb 9 oz (70.563 kg)  BMI  29.41 kg/m2  SpO2 98%  Neurological Exam: MENTAL STATUS including orientation to time, place, person, recent and remote memory, attention span and concentration, language, and fund of knowledge is fair.   Speech is not dysarthric. Slightly blunted affect.   CRANIAL NERVES:  Pupils round and reactive to light. Extraocular muscles are intact. Face is symmetric. Palate elevates symmetrically and tongue is midline.  MOTOR:  Motor strength is 5/5 in all extremities, except 5-/5 toe extensors bilaterally. Tone is normal throughout.  MSRs:  Right  Left brachioradialis 2+  brachioradialis 2+  biceps 2+  biceps 2+  triceps 2+  triceps 2+  patellar 2+  patellar 2+  ankle jerk 0  ankle jerk 0  Hoffman no  Hoffman no   SENSORY:  Vibration mildly diminished at great toe.  Pin prick intact distally and proximally.  COORDINATION/GAIT:  Finger tapping is reduced in amplitude and speed bilaterally. Stooped posture, small steps, gait narrow based and stable. She turns and 6 steps.  Data: XR lumbar spine 06/07/2012:  Curvature convex to the left. Worsening of degenerative disc disease at T12-L1, L1-2 and L4-5.   Labs 12/06/2012:  TSH 5.43 Labs 12/15/2012:  Vitamin B12 426, MMA 0.22, ceruloplasmin 29, copper 114 SPEP/UPEP with IFE - no monoclonal gammopathy Labs 01/16/2013:  HbA1c 5.9  EMG 01/15/2013:  Taken together, these findings are consistent with a moderate generalized, large fiber, sensorimotor polyneuropathy affecting the left side. There is a superimposed intraspinal canal lesion (i.e. radiculopathy) affecting the L2-L4 nerve root/segments bilaterally, moderate in degree electrically.  CT brain 10/15/2013: No skull fracture or intracranial hemorrhage. Fluid level within the right maxillary sinus may be related to sinus  disease without secondary findings of orbital injury. If orbital injury were of clinical concern, CT orbits  recommended for further  delineation. Prominent small vessel disease type changes without CT evidence of large acute infarct.  Mild global atrophy without hydrocephalus.   IMPRESSION/PLAN:   1.  Multifactorial gait instability, clinically stable There are several factors contributing to her gait instability including degenerative arthritis of the lumbosacral region, peripheral neuropathy, and likely neurodegenerative process.  She has multilevel degenerative arthritis and lumbosacral radiculopathy for which she completed physical therapy earlier this year and has improved her proximal leg strength. Her neuropathy seems to be mild and I do not feel that it is largely contributing to her gait instability.    She is doing better today with no interval falls.  She had CT brain after her last visit because of her fall in June, images were reviewed and does not show intracranial hemorrhage.  Headaches have also improved. Recommended continuing home PT exercises  2.  Dementia, likely age related, clinically stable Discussed starting anticholinergic medication, but patient declined and prefers to be followed clinically  3.  Possible parkinsonism  She had stooped posture with shuffling gait on exam and mild bradykinesia suggestive of parkinsonism.  There is no no tremor or rigidity on exam.  I offered trial of sinemet, but patient declined due to possible side effect of lightheadedness and orthostatic intolerance.    4.  Vitamin B12 deficiency Patient provided own B12, but we will administer in the clinic as she was unable to make it to her PCP's office  5.  She has many non-neurological complaints today including foot pain (seeing podiatry), urinary frequency/urgency, and frustration with getting teeth implants and I encouraged her to follow-up with the appropriate provider for care of these specific issues, as it is beyond my realm of expertise.    5. Return to clinic in 36-months   The duration of  this appointment visit was 30 minutes of face-to-face time with the patient.  Greater than 50% of this time was spent in counseling, explanation of diagnosis, planning of further management, and coordination of care.   Thank you for allowing me to participate in patient's care.  If I can answer any additional questions, I would be pleased to do so.    Sincerely,    Donika K. Posey Pronto, DO

## 2013-11-21 ENCOUNTER — Telehealth: Payer: Self-pay | Admitting: Internal Medicine

## 2013-11-21 ENCOUNTER — Telehealth: Payer: Self-pay | Admitting: *Deleted

## 2013-11-21 NOTE — Telephone Encounter (Signed)
Caryl Pina states pt called the office requesting bladder medication.  Caryl Pina would like for you to call her back about pt's request.

## 2013-11-21 NOTE — Telephone Encounter (Signed)
Spoke with patient and she said that Dr. Posey Pronto would call in something for her bladder.  In Dr. Serita Grit last note, it says for patient to follow up with primary care doctor for that.  I called PCP and left message for her nurse to call me back.  Patient called me back and I instructed her again to follow up with PCP.  Patient agreed.

## 2013-11-21 NOTE — Telephone Encounter (Signed)
Please give patient a call she has questions Callback # [phone]

## 2013-11-22 NOTE — Telephone Encounter (Signed)
Pt lvm about bladder and foot.   LVM with pt to confirm the type of med she is needing. Pt only stated Voltaren gel for foot on my vm.

## 2013-11-26 ENCOUNTER — Telehealth: Payer: Self-pay | Admitting: *Deleted

## 2013-11-26 NOTE — Telephone Encounter (Signed)
I called the patient and told her that Dr. Paulla Dolly said he does not treat legs.  She stated it's not my leg it's my foot.  I don't see him for my leg.   Where did he get that from.  I told her that is what she said when she called.  She said I am sorry, I didn't mean my leg.  I asked her where her foot was hurting.  She stated it hurts in the big toe and the arch of her foot.  I told her I will ask Dr. Paulla Dolly and give you a call back.  She said okay thank you.

## 2013-11-26 NOTE — Telephone Encounter (Signed)
I was in to see Dr. Paulla Dolly on the 10th for foot pain and toenail extraction.  I've got my pain in my right leg.  It has started up again.  I was calling to see if Dr. Paulla Dolly can call me in some Voltaren Gel.  I'd appreciate a call back.

## 2013-11-27 ENCOUNTER — Telehealth: Payer: Self-pay | Admitting: Neurology

## 2013-11-27 NOTE — Telephone Encounter (Signed)
Attempted to contact patient.  Left message for her to call me back. 

## 2013-11-27 NOTE — Telephone Encounter (Signed)
Sarah [name], can you see if patient was able to get Rx on voltaren gel and whether she is tolerating it ok?  She has an allergy to medications in the same class so want to make sure she is not experiencing side effects prior to prescribing again. If ok, please prescribe voltaren 1% gel, apply to feet three times daily for pain, 100g, #2 refills.  Donika K. Posey Pronto, DO

## 2013-11-28 ENCOUNTER — Other Ambulatory Visit: Payer: Self-pay | Admitting: *Deleted

## 2013-11-28 MED ORDER — DICLOFENAC SODIUM 1 % TD GEL: TRANSDERMAL | Status: DC

## 2013-11-28 NOTE — Telephone Encounter (Signed)
[phone] pt wants to talk to someone today please call she was returning [place] call

## 2013-11-29 NOTE — Telephone Encounter (Signed)
Sarah [name] said that she spoke with patient and has ordered the Voltaren.  No allergies to this med.

## 2013-11-29 NOTE — Telephone Encounter (Signed)
I called and informed the patient that Dr. Josephina Shih the Voltaren Gel but I see you have gotten it from Dr. Posey Pronto.  She stated yes, I was going around in circles, they delivered it to me.  She stated, He was going to give it to me?  Well bless his heart.  Tell him I said thank you anyway.

## 2013-12-14 ENCOUNTER — Other Ambulatory Visit: Payer: Self-pay

## 2013-12-17 ENCOUNTER — Telehealth: Payer: Self-pay

## 2013-12-17 NOTE — Telephone Encounter (Signed)
Pt will need an OV for this problem to arrange order for MRI if needed -  OV can be with any provider including Charlann Boxer or Linus Orn since she has seen them before (if I don;t have availability) Thanks!

## 2013-12-17 NOTE — Telephone Encounter (Signed)
Pt is requesting MRI of her back (lower - waist down) due to sx: pain. Pain scale 8/10 this has been ongoing for a long time according to pt.   Please advise.Marland KitchenMarland Kitchen

## 2013-12-18 NOTE — Telephone Encounter (Signed)
Pt informed of MD response.  

## 2014-01-07 ENCOUNTER — Other Ambulatory Visit: Payer: Self-pay | Admitting: Internal Medicine

## 2014-01-08 DIAGNOSIS — Z23 Encounter for immunization: Secondary | ICD-10-CM | POA: Diagnosis not present

## 2014-01-11 ENCOUNTER — Other Ambulatory Visit: Payer: Self-pay

## 2014-01-11 ENCOUNTER — Other Ambulatory Visit: Payer: Self-pay | Admitting: Internal Medicine

## 2014-01-22 ENCOUNTER — Telehealth: Payer: Self-pay | Admitting: Cardiology

## 2014-01-22 NOTE — Telephone Encounter (Signed)
Scheduled follow up ov for patient  

## 2014-01-22 NOTE — Telephone Encounter (Signed)
New message          Pt would like for Rip Harbour to give her a call / pt did not disclose any other info

## 2014-01-28 ENCOUNTER — Telehealth: Payer: Self-pay | Admitting: Internal Medicine

## 2014-01-28 NOTE — Telephone Encounter (Signed)
Pt wants to know if she should continue with mammograms, being 80 years old she wanted direction.

## 2014-01-29 NOTE — Telephone Encounter (Signed)
Ok to discontinue regular mammograms

## 2014-01-30 ENCOUNTER — Telehealth: Payer: Self-pay

## 2014-01-30 NOTE — Telephone Encounter (Signed)
Spoke to pt and gave pt MD advise.

## 2014-01-30 NOTE — Telephone Encounter (Signed)
LVM for pt to return my call to give MD advise below.

## 2014-02-01 ENCOUNTER — Encounter: Payer: Self-pay | Admitting: Family Medicine

## 2014-02-07 NOTE — Telephone Encounter (Signed)
Pt called and needs an appt with Dr. Tamala Julian for an inj in her back.

## 2014-02-08 ENCOUNTER — Encounter: Payer: Self-pay | Admitting: Cardiology

## 2014-02-08 ENCOUNTER — Ambulatory Visit (INDEPENDENT_AMBULATORY_CARE_PROVIDER_SITE_OTHER): Payer: Medicare Other | Admitting: Cardiology

## 2014-02-08 VITALS — BP 130/58 | HR 97 | Ht 61.0 in | Wt 161.1 lb

## 2014-02-08 DIAGNOSIS — E538 Deficiency of other specified B group vitamins: Secondary | ICD-10-CM

## 2014-02-08 DIAGNOSIS — E7800 Pure hypercholesterolemia, unspecified: Secondary | ICD-10-CM

## 2014-02-08 DIAGNOSIS — M159 Polyosteoarthritis, unspecified: Secondary | ICD-10-CM

## 2014-02-08 DIAGNOSIS — R079 Chest pain, unspecified: Secondary | ICD-10-CM

## 2014-02-08 DIAGNOSIS — I119 Hypertensive heart disease without heart failure: Secondary | ICD-10-CM | POA: Diagnosis not present

## 2014-02-08 DIAGNOSIS — M150 Primary generalized (osteo)arthritis: Secondary | ICD-10-CM | POA: Diagnosis not present

## 2014-02-08 NOTE — Patient Instructions (Signed)
Your physician recommends that you continue on your current medications as directed. Please refer to the Current Medication list given to you today.  Your physician wants you to follow-up in: 6 month ov You will receive a reminder letter in the mail two months in advance. If you don't receive a letter, please call our office to schedule the follow-up appointment.  

## 2014-02-08 NOTE — Assessment & Plan Note (Signed)
She is on monthly B12 shots given by her PCP.

## 2014-02-08 NOTE — Assessment & Plan Note (Signed)
The patient has not been experiencing any chest pain to suggest angina pectoris.  She complains of feeling sore all over her body.  She has been diagnosed with neuropathy.  She is on Voltaren.

## 2014-02-08 NOTE — Assessment & Plan Note (Signed)
She has a past history of hyperkalemia followed by her PCP.  She is not on any statin therapy.  Previously Lipitor had to be stopped because of leg weakness and memory loss.

## 2014-02-08 NOTE — Progress Notes (Signed)
Sarah [name] Date of Birth:  April 21, 1926 [place] [address] [place], [place]  [id number] [phone]        Fax   [phone]   History of Present Illness: This pleasant 80 year old widowed Caucasian female is seen for a 6 month followup office visit. She has a past history of atypical chest pain. She does not have any history of ischemic heart disease. She had a normal nuclear stress test in April 2012. She has a normal left ventricular function with ejection fraction of 88%. She does have a history of hypercholesterolemia but is intolerant of statins because of leg weakness and memory loss. Since last visit she is been stable from a cardiac standpoint. She continues to have occasional chest discomfort relieved by sublingual nitroglycerin. She has been diagnosed by her neurologist Dr. Posey Pronto with neuropathy. It affects her feet. She is no longer allowed to drive. She has a woman that comes to her home and drives her to appointments and does her shopping for her etc. The patient lives alone at night but seems to be managing okay. Current Outpatient Prescriptions  Medication Sig Dispense Refill  . acetaminophen (TYLENOL) 500 MG tablet Take 1 tablet (500 mg total) by mouth every 8 (eight) hours as needed. 180 tablet 2  . ALPRAZolam (XANAX) 1 MG tablet Take 1 tablet (1 mg total) by mouth 2 (two) times daily as needed. 60 tablet 5  . aspirin 81 MG tablet Take 81 mg by mouth daily.    . Cholecalciferol (VITAMIN D PO) Take by mouth. Taking 2000 daily     . clobetasol ointment (TEMOVATE) 4.56 % Apply 1 application topically 2 (two) times daily.     . cyanocobalamin (,VITAMIN B-12,) 1000 MCG/ML injection INJECT 1 ML INTO THE MUSCLE EVERY 30 DAYS. 10 mL 5  . diclofenac sodium (VOLTAREN) 1 % GEL Apply  To feet 3 times daily prn for pain 1 Tube 2  . furosemide (LASIX) 20 MG tablet Take 1 tablet (20 mg total) by mouth as needed. 30 tablet 5  . Glucosamine HCl 750 MG  TABS Take by mouth 2 (two) times daily.    Marland Kitchen ibandronate (BONIVA) 150 MG tablet Take 1 tablet (150 mg total) by mouth every 30 (thirty) days. 1 tablet 5  . miconazole (MONISTAT 7) 100 MG vaginal suppository Place 1 suppository (100 mg total) vaginally at bedtime. 7 suppository 0  . Misc Natural Products (TUMERSAID) TABS Take 1 capsule by mouth 2 (two) times daily. 60 tablet 3  . naproxen sodium (ALEVE) 220 MG tablet Take 220 mg by mouth 3 (three) times daily with meals.    . nitroGLYCERIN (NITROSTAT) 0.4 MG SL tablet Place 1 tablet (0.4 mg total) under the tongue every 5 (five) minutes as needed for chest pain. 100 tablet prn  . nortriptyline (PAMELOR) 25 MG capsule TAKE 1 CAPSULE AT BEDTIME. 30 capsule 5  . pantoprazole (PROTONIX) 40 MG tablet TAKE 1 TABLET ONCE DAILY. 30 tablet 3  . perphenazine (TRILAFON) 2 MG tablet TAKE ONE TABLET AT BEDTIME. 30 tablet 5  . solifenacin (VESICARE) 10 MG tablet Take 1 tablet (10 mg total) by mouth as needed. 30 tablet 5  . triazolam (HALCION) 0.25 MG tablet Take 0.5-1 tablets (0.125-0.25 mg total) by mouth at bedtime as needed. 30 tablet 5  . [DISCONTINUED] solifenacin (VESICARE) 10 MG tablet Take 10 mg by mouth as needed.      No current facility-administered medications for this visit.  Allergies  Allergen Reactions  . Celebrex [Celecoxib]     rash  . Lipitor [Atorvastatin Calcium]     Leg weakness,memory loss  . Lovastatin     Leg weakness  . Other     Always pads  . Latex Rash    Patient Active Problem List   Diagnosis Date Noted  . Fall 10/05/2013  . Hyponatremia 10/05/2013  . Memory deficits 10/05/2013  . Arthritis of sacroiliac joint 09/11/2013  . Lumbosacral spondylosis without myelopathy 02/26/2013  . Abnormality of gait 12/15/2012  . Vaginitis and vulvovaginitis 12/06/2012  . Right shoulder pain   . Vitamin B12 deficiency 04/13/2012  . Hypercholesterolemia 11/05/2011  . Osteoporosis, postmenopausal 06/02/2011  . Mixed  incontinence urge and stress 06/02/2011  . Anxiety   . Edema 04/06/2011  . Tachycardia 04/06/2011  . Osteoarthritis   . Chest pain     History  Smoking status  . Never Smoker   Smokeless tobacco  . Never Used    History  Alcohol Use No    Family History  Problem Relation Age of Onset  . Cancer Son     testicular cancer  . Colon cancer Neg Hx   . Esophageal cancer Neg Hx   . Rectal cancer Neg Hx   . Stomach cancer Neg Hx   . Breast cancer Sister   . Skin cancer Sister     Review of Systems: Constitutional: no fever chills diaphoresis or fatigue or change in weight.  Head and neck: no hearing loss, no epistaxis, no photophobia or visual disturbance. Respiratory: No cough, shortness of breath or wheezing. Cardiovascular: No chest pain peripheral edema, palpitations. Gastrointestinal: No abdominal distention, no abdominal pain, no change in bowel habits hematochezia or melena. Genitourinary: No dysuria, no frequency, no urgency, no nocturia. Musculoskeletal:No arthralgias, no back pain, no gait disturbance or myalgias. Neurological: No dizziness, no headaches, no numbness, no seizures, no syncope, no weakness, no tremors. Hematologic: No lymphadenopathy, no easy bruising. Psychiatric: No confusion, no hallucinations, no sleep disturbance.    Physical Exam: Filed Vitals:   02/08/14 1335  BP: 130/58  Pulse: 97  The patient appears to be in no distress.she walks with a walker.  Head and neck exam reveals that the pupils are equal and reactive.  The extraocular movements are full.  There is no scleral icterus.  Mouth and pharynx are benign.  No lymphadenopathy.  No carotid bruits.  The jugular venous pressure is normal.  Thyroid is not enlarged or tender.  Chest is clear to percussion and auscultation.  No rales or rhonchi.  Expansion of the chest is symmetrical.  Heart reveals no abnormal lift or heave.  First and second heart sounds are normal.  There is no murmur  gallop rub or click.  The abdomen is soft and nontender.  Bowel sounds are normoactive.  There is no hepatosplenomegaly or mass.  There are no abdominal bruits.  Extremities reveal no phlebitis or edema.  Pedal pulses are good.  There is no cyanosis or clubbing.ankles are tender to touch.  No significant pitting edema.  Neurologic exam is normal strength and no lateralizing weakness.  No sensory deficits.  Integument reveals no rash   Assessment / Plan: 1.hypercholesterolemia, intolerant of statins. 2.  Chest pain responsive to sublingual nitroglycerin.  No previous objective evidence of ischemic heart disease. 3.  Neuropathy followed by neurologist 4.  History of vitamin B-12 deficiency receiving monthly B12 shots  Disposition: Continue on same medication.  Recheck here in 6  months for office visit. She has gained 7 pounds since last office visit.  Watch her diet carefully.

## 2014-02-08 NOTE — Assessment & Plan Note (Signed)
Patient has significant osteoarthritis.  She walks with a walker.  She is on Voltaren for arthritis and neuropathy.

## 2014-02-12 ENCOUNTER — Ambulatory Visit (INDEPENDENT_AMBULATORY_CARE_PROVIDER_SITE_OTHER): Payer: Medicare Other | Admitting: Neurology

## 2014-02-12 ENCOUNTER — Encounter: Payer: Self-pay | Admitting: Neurology

## 2014-02-12 VITALS — BP 120/68 | HR 92 | Wt 161.2 lb

## 2014-02-12 DIAGNOSIS — M47817 Spondylosis without myelopathy or radiculopathy, lumbosacral region: Secondary | ICD-10-CM

## 2014-02-12 DIAGNOSIS — G20 Parkinson's disease: Secondary | ICD-10-CM | POA: Diagnosis not present

## 2014-02-12 DIAGNOSIS — M159 Polyosteoarthritis, unspecified: Secondary | ICD-10-CM | POA: Diagnosis not present

## 2014-02-12 DIAGNOSIS — R269 Unspecified abnormalities of gait and mobility: Secondary | ICD-10-CM

## 2014-02-12 DIAGNOSIS — E538 Deficiency of other specified B group vitamins: Secondary | ICD-10-CM

## 2014-02-12 MED ORDER — DICLOFENAC SODIUM 1 % TD GEL: TRANSDERMAL | Status: DC

## 2014-02-12 MED ORDER — CYANOCOBALAMIN 1000 MCG/ML IJ SOLN
1000.0000 ug | Freq: Once | INTRAMUSCULAR | Status: AC
  Administered 2014-02-12: 1000 ug via INTRAMUSCULAR

## 2014-02-12 NOTE — Patient Instructions (Addendum)
1.  We will send referral to Dr. Tamala Julian for a follow-up visit 2.  Start home physical therapy for gait training [facility]) 3.  Refills provided for voltaren gel.  Please follow-up with Dr. Asa Lente regarding arthritis pain of the knees and ankles. 4.  We will administer your vitamin B12 injection today 5.  Return to clinic in 8-months

## 2014-02-12 NOTE — Progress Notes (Signed)
[facility]  Follow-up Visit   Date: 02/12/2014   Sarah [name] MRN: [id number] DOB: 05/17/1926   Interim History: Sarah [name] is a 80 y.o. right-handed Caucasian female with history of HTN, HPL, depression, esophageal strictures, and GERD returning to the clinic for follow-up of leg weaknes.  The patient was accompanied to the clinic by [name], her caregiver.    History of present illness: Leg weakness started slowly around May 2014 when she noticed difficulty with walking and tripping on things. She has a hard time describing her walking problems, but says that her legs feel weak. She would stumble frequently, and denies any falls.  She has associated bilateral achy knee pain which feels tight and is worse when she applies pressure on it. Her symptoms steadily worsened. She reports having chronic back pain but had worsening pain in August 2014. Pain is achy located at the low back and does not radiate into her buttocks or legs. Forward flexion makes her pain worse and it is improved by laying down.   Follow-up 01/16/2013:  Neuropathy labs which all returned normal.  EMG was performed and shows moderate peripheral neuropathy of the legs and L3-L4 old radiculopathy.   Follow-up 04/18/2013:  In December, she saw Dr. Tamala Julian for back pain who found SI joint dysfunction and performed injection which some benefit. Because of ongoing leg weakness, she started using a cane in November 2014 and feels much more stable.  She also has a caregiver that comes 10-2p who helps her dress, prepare meals, and does her errands which she appreciates a lot.    - Follow 07/10/2013:  She has completed physical therapy and reports significant improvement, because she is able to lift her legs much easier.   UPDATE 10/09/2013:  She had an unwittnessed fall on 6/17 while transferring a few things to her dining table.  She hit the back of her head and bruised her right wrist.  There was no  loss of consciousness. Caregiver says that she is more forgetful than usual, especially with conversation.  Since her fall, she has had intermittent headaches, described as achy, occuring daily and last all day. It usually responds to tylenol.    UPDATE 11/20/2013:  She developed severe right foot pain and she podiatry who injected her foot, which helped slightly. She applies icey hot to her feet which helps.   UPDATE 02/12/2014:  She continues to have of achy knee and foot pain and reports worsening low back pain.  She is interested in getting another injection by Dr. Tamala Julian because the last alleviated her back pain.  She uses voltaren and tumeric which significantly helps.  Denies any shooting, stabbing, or burning pain.  No tingling, besides mild tingling involving the base of her foot. No interval falls, but does endorse worsening imbalance.   Medications:  Current Outpatient Prescriptions on File Prior to Visit  Medication Sig Dispense Refill  . acetaminophen (TYLENOL) 500 MG tablet Take 1 tablet (500 mg total) by mouth every 8 (eight) hours as needed. 180 tablet 2  . ALPRAZolam (XANAX) 1 MG tablet Take 1 tablet (1 mg total) by mouth 2 (two) times daily as needed. 60 tablet 5  . aspirin 81 MG tablet Take 81 mg by mouth daily.    . Cholecalciferol (VITAMIN D PO) Take by mouth. Taking 2000 daily     . clobetasol ointment (TEMOVATE) 5.32 % Apply 1 application topically 2 (two) times daily.     . cyanocobalamin (,  VITAMIN B-12,) 1000 MCG/ML injection INJECT 1 ML INTO THE MUSCLE EVERY 30 DAYS. 10 mL 5  . furosemide (LASIX) 20 MG tablet Take 1 tablet (20 mg total) by mouth as needed. 30 tablet 5  . Glucosamine HCl 750 MG TABS Take by mouth 2 (two) times daily.    Marland Kitchen ibandronate (BONIVA) 150 MG tablet Take 1 tablet (150 mg total) by mouth every 30 (thirty) days. 1 tablet 5  . miconazole (MONISTAT 7) 100 MG vaginal suppository Place 1 suppository (100 mg total) vaginally at bedtime. 7 suppository 0  .  Misc Natural Products (TUMERSAID) TABS Take 1 capsule by mouth 2 (two) times daily. 60 tablet 3  . naproxen sodium (ALEVE) 220 MG tablet Take 220 mg by mouth 3 (three) times daily with meals.    . nitroGLYCERIN (NITROSTAT) 0.4 MG SL tablet Place 1 tablet (0.4 mg total) under the tongue every 5 (five) minutes as needed for chest pain. 100 tablet prn  . nortriptyline (PAMELOR) 25 MG capsule TAKE 1 CAPSULE AT BEDTIME. 30 capsule 5  . pantoprazole (PROTONIX) 40 MG tablet TAKE 1 TABLET ONCE DAILY. 30 tablet 3  . perphenazine (TRILAFON) 2 MG tablet TAKE ONE TABLET AT BEDTIME. 30 tablet 5  . solifenacin (VESICARE) 10 MG tablet Take 1 tablet (10 mg total) by mouth as needed. 30 tablet 5  . triazolam (HALCION) 0.25 MG tablet Take 0.5-1 tablets (0.125-0.25 mg total) by mouth at bedtime as needed. 30 tablet 5  . [DISCONTINUED] solifenacin (VESICARE) 10 MG tablet Take 10 mg by mouth as needed.      No current facility-administered medications on file prior to visit.    Allergies:  Allergies  Allergen Reactions  . Celebrex [Celecoxib]     rash  . Lipitor [Atorvastatin Calcium]     Leg weakness,memory loss  . Lovastatin     Leg weakness  . Other     Always pads  . Latex Rash     Review of Systems:  CONSTITUTIONAL: No fevers, chills, night sweats, or weight loss.   EYES: No visual changes or eye pain ENT: No hearing changes.  No history of nose bleeds. RESPIRATORY: No cough, wheezing and shortness of breath.   CARDIOVASCULAR: Negative for chest pain, and palpitations.   GI: Negative for abdominal discomfort, blood in stools or black stools.  No recent change in bowel habits.  GU:  No history of incontinence.   MUSCLOSKELETAL: No history of joint pain or swelling.  No myalgias.  + back pain SKIN: Negative for lesions, rash, and itching.   HEMATOLOGY/ONCOLOGY: Negative for prolonged bleeding, bruising easily, and swollen nodes.   ENDOCRINE: Negative for cold or heat intolerance, polydipsia or  goiter.   PSYCH:  No depression or anxiety symptoms.   NEURO: As Above.   Vital Signs:  BP 120/68 mmHg  Pulse 92  Wt 161 lb 3 oz (73.114 kg)  SpO2 98%  Neurological Exam: MENTAL STATUS including orientation to time, place, person, recent and remote memory is fairly intact.  Slightly blunted affect.   CRANIAL NERVES:  Pupils round and reactive to light. Extraocular muscles are intact. Face is symmetric. Palate elevates symmetrically and tongue is midline.  MOTOR:  Motor strength is 5/5 in proximal muscles. Tone is normal throughout. Tenderness to palpation over the knees and ankles, without warmth.  Mild pitting edema of the legs.   MSRs: Reflexes are 2+/4 at knees and absent at Achilles   Data: XR lumbar spine 06/07/2012:  Curvature convex to  the left. Worsening of degenerative disc disease at T12-L1, L1-2 and L4-5.   Labs 12/06/2012:  TSH 5.43 Labs 12/15/2012:  Vitamin B12 426, MMA 0.22, ceruloplasmin 29, copper 114 SPEP/UPEP with IFE - no monoclonal gammopathy Labs 01/16/2013:  HbA1c 5.9  EMG 01/15/2013:  Taken together, these findings are consistent with a moderate generalized, large fiber, sensorimotor polyneuropathy affecting the left side. There is a superimposed intraspinal canal lesion (i.e. radiculopathy) affecting the L2-L4 nerve root/segments bilaterally, moderate in degree electrically.  CT brain 10/15/2013: No skull fracture or intracranial hemorrhage. Fluid level within the right maxillary sinus may be related to sinus  disease without secondary findings of orbital injury. If orbital injury were of clinical concern, CT orbits recommended for further  delineation. Prominent small vessel disease type changes without CT evidence of large acute infarct.  Mild global atrophy without hydrocephalus.   IMPRESSION/PLAN:   1.  Multifactorial gait instability, clinically worsening  There are several factors contributing to her gait instability including degenerative arthritis  of the lumbosacral region, peripheral neuropathy, and likely neurodegenerative process.  She also has joint-related pain of her knees and ankles, which is most likely contributing to her worsening balance of late. Recommend restarting physical therapy for gait training She reports having significant benefit of steroid injection by Dr. Tamala Julian in the past and would like to have this again, we will send referral for f/u visit  2.  Dementia, likely age related, clinically stable Anticholinergic medication was deferred  3.  Possible parkinsonism  She had stooped posture with shuffling gait on exam and mild bradykinesia suggestive of parkinsonism.  There is no no tremor or rigidity on exam.  Trial of sinemet deferred  4.  Peripheral neuropathy due to vitamin B12 deficiency Clinically without burning/tingling paresthesias of the feet, and describes more "achy pain" which is more suggestive of arthritis causing her discomfort Continue monthly B12, will administer injection today  5. Osteoarthritis, likely causing knee pain and contributing to ankle pain Refill provided for diclofenac gel, encouraged her to f/u with her PCP  6. Return to clinic in 20-months   The duration of this appointment visit was 30 minutes of face-to-face time with the patient.  Greater than 50% of this time was spent in counseling, explanation of diagnosis, planning of further management, and coordination of care.   Thank you for allowing me to participate in patient's care.  If I can answer any additional questions, I would be pleased to do so.    Sincerely,    Ibrahima Holberg K. Posey Pronto, DO

## 2014-02-12 NOTE — Progress Notes (Signed)
Patient in for B12 injection. 

## 2014-02-12 NOTE — Addendum Note (Signed)
Addended by: Chester Holstein on: 02/12/2014 03:06 PM   Modules accepted: Orders

## 2014-02-13 ENCOUNTER — Telehealth: Payer: Self-pay | Admitting: *Deleted

## 2014-02-13 ENCOUNTER — Telehealth: Payer: Self-pay | Admitting: Neurology

## 2014-02-13 NOTE — Telephone Encounter (Signed)
[phone] °

## 2014-02-13 NOTE — Telephone Encounter (Signed)
Left message for patient to call me about appointment with Dr. Hulan Saas.  November 24 at 10:00.

## 2014-02-13 NOTE — Telephone Encounter (Signed)
Patient called back and can't do the 24th.  I rescheduled her appointment to December 4 at 10:30.  Patient notified of date and time.

## 2014-02-15 DIAGNOSIS — E538 Deficiency of other specified B group vitamins: Secondary | ICD-10-CM | POA: Diagnosis not present

## 2014-02-15 DIAGNOSIS — Z7982 Long term (current) use of aspirin: Secondary | ICD-10-CM | POA: Diagnosis not present

## 2014-02-15 DIAGNOSIS — I10 Essential (primary) hypertension: Secondary | ICD-10-CM | POA: Diagnosis not present

## 2014-02-15 DIAGNOSIS — I2510 Atherosclerotic heart disease of native coronary artery without angina pectoris: Secondary | ICD-10-CM | POA: Diagnosis not present

## 2014-02-15 DIAGNOSIS — F419 Anxiety disorder, unspecified: Secondary | ICD-10-CM | POA: Diagnosis not present

## 2014-02-15 DIAGNOSIS — Z9181 History of falling: Secondary | ICD-10-CM | POA: Diagnosis not present

## 2014-02-15 DIAGNOSIS — M5137 Other intervertebral disc degeneration, lumbosacral region: Secondary | ICD-10-CM | POA: Diagnosis not present

## 2014-02-15 DIAGNOSIS — F0390 Unspecified dementia without behavioral disturbance: Secondary | ICD-10-CM | POA: Diagnosis not present

## 2014-02-15 DIAGNOSIS — M1990 Unspecified osteoarthritis, unspecified site: Secondary | ICD-10-CM | POA: Diagnosis not present

## 2014-02-15 DIAGNOSIS — F329 Major depressive disorder, single episode, unspecified: Secondary | ICD-10-CM | POA: Diagnosis not present

## 2014-02-18 ENCOUNTER — Other Ambulatory Visit: Payer: Self-pay | Admitting: Internal Medicine

## 2014-02-19 ENCOUNTER — Other Ambulatory Visit: Payer: Self-pay | Admitting: Internal Medicine

## 2014-02-19 ENCOUNTER — Ambulatory Visit: Payer: Medicare Other | Admitting: Family Medicine

## 2014-02-25 ENCOUNTER — Telehealth: Payer: Self-pay | Admitting: Internal Medicine

## 2014-02-25 NOTE — Telephone Encounter (Signed)
Patient states arthritis in legs and knees is getting worse and would like to know if she needs to do a med change or needs to come in.  She would like to speak with you first before she does anything.

## 2014-02-26 DIAGNOSIS — F0390 Unspecified dementia without behavioral disturbance: Secondary | ICD-10-CM | POA: Diagnosis not present

## 2014-02-26 DIAGNOSIS — M5137 Other intervertebral disc degeneration, lumbosacral region: Secondary | ICD-10-CM | POA: Diagnosis not present

## 2014-02-26 DIAGNOSIS — F329 Major depressive disorder, single episode, unspecified: Secondary | ICD-10-CM | POA: Diagnosis not present

## 2014-02-26 DIAGNOSIS — E538 Deficiency of other specified B group vitamins: Secondary | ICD-10-CM | POA: Diagnosis not present

## 2014-02-26 DIAGNOSIS — F419 Anxiety disorder, unspecified: Secondary | ICD-10-CM | POA: Diagnosis not present

## 2014-02-26 DIAGNOSIS — M1990 Unspecified osteoarthritis, unspecified site: Secondary | ICD-10-CM | POA: Diagnosis not present

## 2014-02-26 NOTE — Telephone Encounter (Signed)
Spoke to pt and she has an appt to see dr Tamala Julian on Friday.

## 2014-02-26 NOTE — Telephone Encounter (Signed)
I recommend evaluation for this problem either with me, Dr. Tamala Julian, or nurse practitioner Marya Amsler -prior to any change of medications. Thanks

## 2014-02-26 NOTE — Telephone Encounter (Signed)
Lvm for pt to call back. 

## 2014-03-01 ENCOUNTER — Other Ambulatory Visit (INDEPENDENT_AMBULATORY_CARE_PROVIDER_SITE_OTHER): Payer: Medicare Other

## 2014-03-01 ENCOUNTER — Ambulatory Visit (INDEPENDENT_AMBULATORY_CARE_PROVIDER_SITE_OTHER)
Admission: RE | Admit: 2014-03-01 | Discharge: 2014-03-01 | Disposition: A | Discharge status: Home / Self Care | Payer: Medicare Other | Source: Ambulatory Visit | Attending: Family Medicine | Admitting: Family Medicine

## 2014-03-01 ENCOUNTER — Encounter: Payer: Self-pay | Admitting: Family Medicine

## 2014-03-01 ENCOUNTER — Ambulatory Visit (INDEPENDENT_AMBULATORY_CARE_PROVIDER_SITE_OTHER): Payer: Medicare Other | Admitting: Family Medicine

## 2014-03-01 DIAGNOSIS — M47818 Spondylosis without myelopathy or radiculopathy, sacral and sacrococcygeal region: Secondary | ICD-10-CM

## 2014-03-01 DIAGNOSIS — M47817 Spondylosis without myelopathy or radiculopathy, lumbosacral region: Secondary | ICD-10-CM

## 2014-03-01 DIAGNOSIS — M170 Bilateral primary osteoarthritis of knee: Secondary | ICD-10-CM | POA: Diagnosis not present

## 2014-03-01 DIAGNOSIS — M5137 Other intervertebral disc degeneration, lumbosacral region: Secondary | ICD-10-CM | POA: Diagnosis not present

## 2014-03-01 DIAGNOSIS — M4698 Unspecified inflammatory spondylopathy, sacral and sacrococcygeal region: Secondary | ICD-10-CM

## 2014-03-01 DIAGNOSIS — E538 Deficiency of other specified B group vitamins: Secondary | ICD-10-CM | POA: Diagnosis not present

## 2014-03-01 DIAGNOSIS — M461 Sacroiliitis, not elsewhere classified: Secondary | ICD-10-CM

## 2014-03-01 DIAGNOSIS — M1990 Unspecified osteoarthritis, unspecified site: Secondary | ICD-10-CM | POA: Diagnosis not present

## 2014-03-01 DIAGNOSIS — F329 Major depressive disorder, single episode, unspecified: Secondary | ICD-10-CM | POA: Diagnosis not present

## 2014-03-01 DIAGNOSIS — F0390 Unspecified dementia without behavioral disturbance: Secondary | ICD-10-CM | POA: Diagnosis not present

## 2014-03-01 DIAGNOSIS — F419 Anxiety disorder, unspecified: Secondary | ICD-10-CM | POA: Diagnosis not present

## 2014-03-01 NOTE — Assessment & Plan Note (Signed)
Based on patient's presentation do think there is likely at bilateral knee arthritis. We discussed icing. We discussed formal physical therapy. Patient will do this at her assisted living community. Patient will try some over-the-counter medications that we discussed. Patient and will come back and see me again in 2 weeks. If continued have pain we'll consider injections in the knee for symptomatically relief.

## 2014-03-01 NOTE — Patient Instructions (Addendum)
Good to see you as always.  Ice is your friend to back and to the knees up to 10 minutes 2 times daily.  Continue with physical therapy I think it will be great.  Try the pennsaid and use it if you like it better then the voltaren See me again in 10 days and then if knees still hurt will do injection.

## 2014-03-01 NOTE — Progress Notes (Signed)
CC: Back pain followup  HPI: Patient is a very pleasant 80 year old female coming in with chronic back pain. Patient was seen previously by me greater than 6 months ago and did have an injection in the right SI joint. Patient does have a past medical history significant for spinal stenosis. Patient is also had sacroiliac joint and has responded very well to the injections previously. Patient had having similar pain again.  Patient is also complaining of bilateral knee pain. States that for the last several weeks his gotten significant worse than previously. States that this is stopping her from some overactivity. Patient does have vitamin B12 deficiency as well as Parkinson's. Patient did see neurology and they feel that this is likely multifactorial. Patient is still ambulating with the aid of a walker. Describes the knee pain is more of a dull aching sensation. Has not tried any home remedies but has started with formal physical therapy. Patient puts the severity of pain a 7 out of 10.   Past medical, surgical, family and social history reviewed. Medications reviewed all in the electronic medical record.   Review of Systems: No headache, visual changes, nausea, vomiting, diarrhea, constipation, dizziness, abdominal pain, skin rash, fevers, chills, night sweats, weight loss, swollen lymph nodes, body aches, joint swelling, muscle aches, chest pain, shortness of breath, mood changes.   Objective:    There were no vitals taken for this visit.   General: No apparent distress alert and oriented x3 mood and affect normal, dressed appropriately.  HEENT: Pupils equal, extraocular movements intact Respiratory: Patient's speak in full sentences and does not appear short of breath Cardiovascular: No lower extremity edema, non tender, no erythema Skin: Warm dry intact with no signs of infection or rash on extremities or on axial skeleton. Abdomen: Soft nontender Neuro: Cranial nerves II through XII are  intact, neurovascularly intact in all extremities with 2+ DTRs and 2+ pulses. Lymph: No lymphadenopathy of posterior or anterior cervical chain or axillae bilaterally.  Gait walks with a cane mild broad-based gait MSK: Non tender with full range of motion and good stability and symmetric strength and tone of shoulders, elbows, wrist, hip, and ankles bilaterally. Moderate loss of range of motion of multiple joints secondary to osteoporosis. Back Exam:  Inspection:increase kyphosis as well as some scoliosis Motion: Flexion 35 deg, Extension 25 deg, Side Bending to 25 deg bilaterally,  Rotation to 25 deg bilaterally  SLR laying: pain but no radiation  XSLR laying: Negative  Palpable tenderness: right SI joint and severe FABER: unable for patient to participate. Sensory change: Gross sensation intact to all lumbar and sacral dermatomes.  Reflexes: 2+ at both patellar tendons, 2+ at achilles tendons, Babinski's downgoing.  Strength at foot  Plantar-flexion: 5/5 Dorsi-flexion: 5/5 Eversion: 5/5 Inversion: 5/5  Leg strength  Quad: 4/5 Hamstring: 4/5 Hip flexor: 4/5 Hip abductors: 3/5  But symmetric to contralateral side.   Knee bilateral: Moderate valgus deformity bilaterally Tender to palpation over the medial joint line bilaterally ROM full in flexion and extension and lower leg rotation. Ligaments with solid consistent endpoints including ACL, PCL, LCL, MCL. Non painful patellar compression. Patellar glide with significant crepitus Patellar and quadriceps tendons unremarkable. Hamstring and quadriceps strength is normal, cogwheeling noted..   Previous imaging studies XR lumbar spine 06/07/2012: Findings: There is curvature convex to the left. There is advanced degenerative disc disease at T12-L1, L1-2 and L4-5. The L5-S1 level is transitional. There is anterolisthesis of 3 mm of L4-5.  There is retrolisthesis of  3 mm at T12-L1 and L1-2. These degenerative changes have worsened since 2010.  No evidence of fracture.  IMPRESSION:  Curvature convex to the left. Worsening of degenerative disc disease at T12-L1, L1-2 and L4-5. See above for complete discussion.   Procedure  After verbal consent patient was prepped with alcohol swabs and with a 23-gauge 1-1/2 inch needle patient was injected with 1 cc of Kenalog 0.5% Marcaine and 1 cc of Kenalog 40 mg/dL into the right SI joint under ultrasound guidance. Patient did tolerate the procedure well. Pictures were saved. Patient did have minimal bleeding. Placed Band-Aid over injection site. Post injection instructions  Impression and Recommendations:     This case required medical decision making of moderate complexity.

## 2014-03-01 NOTE — Assessment & Plan Note (Signed)
Ultrasound guided injection was done again today. Patient tolerated the procedure very well. Patient has been having this in 6 month intervals and as long as this continues to help her pain I will continue this. I do think that some lumbar stenosis can also be playing a role. If this continues we may need to consider further imaging or questionable epidural ends injection to see if this will be helpful. Patient will continue with home remedies and patient is going to formal physical therapy.

## 2014-03-04 ENCOUNTER — Other Ambulatory Visit: Payer: Self-pay | Admitting: Internal Medicine

## 2014-03-05 DIAGNOSIS — M1990 Unspecified osteoarthritis, unspecified site: Secondary | ICD-10-CM | POA: Diagnosis not present

## 2014-03-05 DIAGNOSIS — F0390 Unspecified dementia without behavioral disturbance: Secondary | ICD-10-CM | POA: Diagnosis not present

## 2014-03-05 DIAGNOSIS — E538 Deficiency of other specified B group vitamins: Secondary | ICD-10-CM | POA: Diagnosis not present

## 2014-03-05 DIAGNOSIS — F329 Major depressive disorder, single episode, unspecified: Secondary | ICD-10-CM | POA: Diagnosis not present

## 2014-03-05 DIAGNOSIS — M5137 Other intervertebral disc degeneration, lumbosacral region: Secondary | ICD-10-CM | POA: Diagnosis not present

## 2014-03-05 DIAGNOSIS — F419 Anxiety disorder, unspecified: Secondary | ICD-10-CM | POA: Diagnosis not present

## 2014-03-06 DIAGNOSIS — M5137 Other intervertebral disc degeneration, lumbosacral region: Secondary | ICD-10-CM | POA: Diagnosis not present

## 2014-03-06 DIAGNOSIS — F329 Major depressive disorder, single episode, unspecified: Secondary | ICD-10-CM | POA: Diagnosis not present

## 2014-03-06 DIAGNOSIS — M1990 Unspecified osteoarthritis, unspecified site: Secondary | ICD-10-CM | POA: Diagnosis not present

## 2014-03-06 DIAGNOSIS — E538 Deficiency of other specified B group vitamins: Secondary | ICD-10-CM | POA: Diagnosis not present

## 2014-03-06 DIAGNOSIS — F419 Anxiety disorder, unspecified: Secondary | ICD-10-CM | POA: Diagnosis not present

## 2014-03-06 DIAGNOSIS — F0390 Unspecified dementia without behavioral disturbance: Secondary | ICD-10-CM | POA: Diagnosis not present

## 2014-03-12 ENCOUNTER — Other Ambulatory Visit: Payer: Self-pay | Admitting: Internal Medicine

## 2014-03-12 DIAGNOSIS — F419 Anxiety disorder, unspecified: Secondary | ICD-10-CM | POA: Diagnosis not present

## 2014-03-12 DIAGNOSIS — M1990 Unspecified osteoarthritis, unspecified site: Secondary | ICD-10-CM | POA: Diagnosis not present

## 2014-03-12 DIAGNOSIS — F0390 Unspecified dementia without behavioral disturbance: Secondary | ICD-10-CM | POA: Diagnosis not present

## 2014-03-12 DIAGNOSIS — F329 Major depressive disorder, single episode, unspecified: Secondary | ICD-10-CM | POA: Diagnosis not present

## 2014-03-12 DIAGNOSIS — M5137 Other intervertebral disc degeneration, lumbosacral region: Secondary | ICD-10-CM | POA: Diagnosis not present

## 2014-03-12 DIAGNOSIS — E538 Deficiency of other specified B group vitamins: Secondary | ICD-10-CM | POA: Diagnosis not present

## 2014-03-13 ENCOUNTER — Encounter: Payer: Self-pay | Admitting: Family Medicine

## 2014-03-13 ENCOUNTER — Ambulatory Visit (INDEPENDENT_AMBULATORY_CARE_PROVIDER_SITE_OTHER): Payer: Medicare Other | Admitting: Family Medicine

## 2014-03-13 VITALS — BP 122/70 | HR 86 | Ht 61.0 in | Wt 157.0 lb

## 2014-03-13 DIAGNOSIS — M47818 Spondylosis without myelopathy or radiculopathy, sacral and sacrococcygeal region: Secondary | ICD-10-CM

## 2014-03-13 DIAGNOSIS — M170 Bilateral primary osteoarthritis of knee: Secondary | ICD-10-CM

## 2014-03-13 DIAGNOSIS — M4698 Unspecified inflammatory spondylopathy, sacral and sacrococcygeal region: Secondary | ICD-10-CM

## 2014-03-13 NOTE — Progress Notes (Signed)
CC: Back pain followup  HPI: Patient is a very pleasant 80 year old female coming in with chronic back pain. Patient was treated for severe sacroiliac joint arthritis. Patient was given an ultrasound guided injection at last visit. Patient states she is nearly pain free at this time. Would state that she has 75% better. He is able to go up and downstairs and do daily activities significantly better. Patient is walking so much more that she is having foot pain but knows it's because she is not wearing proper shoes.  Patient also was having bilateral knee pain previously. Patient states though that she is getting better. Patient does have Parkinson's and has noticed without as much pain she's been able to a activities of daily living better and this seems to be helping her knees.    Past medical, surgical, family and social history reviewed. Medications reviewed all in the electronic medical record.   Review of Systems: No headache, visual changes, nausea, vomiting, diarrhea, constipation, dizziness, abdominal pain, skin rash, fevers, chills, night sweats, weight loss, swollen lymph nodes, body aches, joint swelling, muscle aches, chest pain, shortness of breath, mood changes.   Objective:    Blood pressure 122/70, pulse 86, height 5\' 1"  (1.549 m), weight 157 lb (71.215 kg), SpO2 99 %.   General: No apparent distress alert and oriented x3 mood and affect normal, dressed appropriately.  HEENT: Pupils equal, extraocular movements intact Respiratory: Patient's speak in full sentences and does not appear short of breath Cardiovascular: No lower extremity edema, non tender, no erythema Skin: Warm dry intact with no signs of infection or rash on extremities or on axial skeleton. Abdomen: Soft nontender Neuro: Cranial nerves II through XII are intact, neurovascularly intact in all extremities with 2+ DTRs and 2+ pulses. Lymph: No lymphadenopathy of posterior or anterior cervical chain or axillae  bilaterally.  Gait walks with a cane mild broad-based gait MSK: Non tender with full range of motion and good stability and symmetric strength and tone of shoulders, elbows, wrist, hip, and ankles bilaterally. Moderate loss of range of motion of multiple joints secondary to osteoporosis. Back Exam:  Inspection:increase kyphosis as well as some scoliosis Motion: Flexion 35 deg, Extension 25 deg, Side Bending to 25 deg bilaterally,  Rotation to 25 deg bilaterally  SLR laying: pain but no radiation  XSLR laying: Negative  Palpable tenderness: Right SI joint still minimally tender FABER: unable for patient to participate. Sensory change: Gross sensation intact to all lumbar and sacral dermatomes.  Reflexes: 2+ at both patellar tendons, 2+ at achilles tendons, Babinski's downgoing.  Strength at foot  Plantar-flexion: 5/5 Dorsi-flexion: 5/5 Eversion: 5/5 Inversion: 5/5  Leg strength  Quad: 4/5 Hamstring: 4/5 Hip flexor: 4/5 Hip abductors: 3/5  But symmetric to contralateral side.   Knee bilateral: Moderate valgus deformity bilaterally Continued tenderness over the medial joint line ROM full in flexion and extension and lower leg rotation. Ligaments with solid consistent endpoints including ACL, PCL, LCL, MCL. Non painful patellar compression. Patellar glide with significant crepitus Patellar and quadriceps tendons unremarkable. Hamstring and quadriceps strength is normal, cogwheeling noted..   Previous imaging studies XR lumbar spine 06/07/2012: Findings: There is curvature convex to the left. There is advanced degenerative disc disease at T12-L1, L1-2 and L4-5. The L5-S1 level is transitional. There is anterolisthesis of 3 mm of L4-5.  There is retrolisthesis of 3 mm at T12-L1 and L1-2. These degenerative changes have worsened since 2010. No evidence of fracture.  IMPRESSION:  Curvature convex to the  left. Worsening of degenerative disc disease at T12-L1, L1-2 and L4-5.    Impression  and Recommendations:     This case required medical decision making of moderate complexity.

## 2014-03-13 NOTE — Assessment & Plan Note (Signed)
Patient does have degenerative changes and is responding since her back has been helpful. Discussed with patient to continue the home exercises. We discussed the possibility of bracing which patient declined. Patient is going to go out of town. Patient starts having worsening pain again I would consider steroid injections in her knees which she has not had done before. Patient will make a follow-up appointment with me in 3 weeks.  Spent greater than 25 minutes with patient face-to-face and had greater than 50% of counseling including as described above in assessment and plan.

## 2014-03-13 NOTE — Assessment & Plan Note (Signed)
Patient did respond to a injection again. Discuss the patient to continue the home exercises as well as the icing protocol. Patient can continue to have these intermittently when needed. Patient can follow-up on an as-needed basis for this problem. This likely will exacerbate later.

## 2014-03-13 NOTE — Patient Instructions (Signed)
Very good to see you and I am glad you are doing better Ice when knees act up Continue to do the exercises Wear tennis shoes inside and outside See me again when you get back and Happy holidays!

## 2014-03-14 ENCOUNTER — Other Ambulatory Visit: Payer: Self-pay | Admitting: Internal Medicine

## 2014-03-15 DIAGNOSIS — F419 Anxiety disorder, unspecified: Secondary | ICD-10-CM | POA: Diagnosis not present

## 2014-03-15 DIAGNOSIS — E538 Deficiency of other specified B group vitamins: Secondary | ICD-10-CM | POA: Diagnosis not present

## 2014-03-15 DIAGNOSIS — F0390 Unspecified dementia without behavioral disturbance: Secondary | ICD-10-CM | POA: Diagnosis not present

## 2014-03-15 DIAGNOSIS — M5137 Other intervertebral disc degeneration, lumbosacral region: Secondary | ICD-10-CM | POA: Diagnosis not present

## 2014-03-15 DIAGNOSIS — F329 Major depressive disorder, single episode, unspecified: Secondary | ICD-10-CM | POA: Diagnosis not present

## 2014-03-15 DIAGNOSIS — M1990 Unspecified osteoarthritis, unspecified site: Secondary | ICD-10-CM | POA: Diagnosis not present

## 2014-03-18 ENCOUNTER — Other Ambulatory Visit: Payer: Self-pay | Admitting: Internal Medicine

## 2014-03-18 ENCOUNTER — Telehealth: Payer: Self-pay | Admitting: Internal Medicine

## 2014-03-18 NOTE — Telephone Encounter (Signed)
Pt's daughter called to inform our office  "My mom, Sarah [name], had her flu shot in October 2015 at [facility]. Please take this reminder off. Thank you! Happy Holidays! Ninfa Meeker"  Please update in our system

## 2014-03-26 DIAGNOSIS — F329 Major depressive disorder, single episode, unspecified: Secondary | ICD-10-CM | POA: Diagnosis not present

## 2014-03-26 DIAGNOSIS — F0390 Unspecified dementia without behavioral disturbance: Secondary | ICD-10-CM | POA: Diagnosis not present

## 2014-03-26 DIAGNOSIS — M1990 Unspecified osteoarthritis, unspecified site: Secondary | ICD-10-CM | POA: Diagnosis not present

## 2014-03-26 DIAGNOSIS — E538 Deficiency of other specified B group vitamins: Secondary | ICD-10-CM | POA: Diagnosis not present

## 2014-03-26 DIAGNOSIS — F419 Anxiety disorder, unspecified: Secondary | ICD-10-CM | POA: Diagnosis not present

## 2014-03-26 DIAGNOSIS — M5137 Other intervertebral disc degeneration, lumbosacral region: Secondary | ICD-10-CM | POA: Diagnosis not present

## 2014-03-27 ENCOUNTER — Other Ambulatory Visit: Payer: Self-pay

## 2014-03-27 DIAGNOSIS — Z1231 Encounter for screening mammogram for malignant neoplasm of breast: Secondary | ICD-10-CM

## 2014-04-02 DIAGNOSIS — M5137 Other intervertebral disc degeneration, lumbosacral region: Secondary | ICD-10-CM | POA: Diagnosis not present

## 2014-04-02 DIAGNOSIS — M1990 Unspecified osteoarthritis, unspecified site: Secondary | ICD-10-CM | POA: Diagnosis not present

## 2014-04-02 DIAGNOSIS — F419 Anxiety disorder, unspecified: Secondary | ICD-10-CM | POA: Diagnosis not present

## 2014-04-02 DIAGNOSIS — E538 Deficiency of other specified B group vitamins: Secondary | ICD-10-CM | POA: Diagnosis not present

## 2014-04-02 DIAGNOSIS — F0390 Unspecified dementia without behavioral disturbance: Secondary | ICD-10-CM | POA: Diagnosis not present

## 2014-04-02 DIAGNOSIS — F329 Major depressive disorder, single episode, unspecified: Secondary | ICD-10-CM | POA: Diagnosis not present

## 2014-04-04 ENCOUNTER — Other Ambulatory Visit: Payer: Self-pay | Admitting: Internal Medicine

## 2014-04-09 DIAGNOSIS — E538 Deficiency of other specified B group vitamins: Secondary | ICD-10-CM | POA: Diagnosis not present

## 2014-04-09 DIAGNOSIS — M1990 Unspecified osteoarthritis, unspecified site: Secondary | ICD-10-CM | POA: Diagnosis not present

## 2014-04-09 DIAGNOSIS — M5137 Other intervertebral disc degeneration, lumbosacral region: Secondary | ICD-10-CM | POA: Diagnosis not present

## 2014-04-09 DIAGNOSIS — F329 Major depressive disorder, single episode, unspecified: Secondary | ICD-10-CM | POA: Diagnosis not present

## 2014-04-09 DIAGNOSIS — F419 Anxiety disorder, unspecified: Secondary | ICD-10-CM | POA: Diagnosis not present

## 2014-04-09 DIAGNOSIS — F0390 Unspecified dementia without behavioral disturbance: Secondary | ICD-10-CM | POA: Diagnosis not present

## 2014-04-10 ENCOUNTER — Ambulatory Visit: Payer: Medicare Other

## 2014-04-12 DIAGNOSIS — F419 Anxiety disorder, unspecified: Secondary | ICD-10-CM | POA: Diagnosis not present

## 2014-04-12 DIAGNOSIS — M1990 Unspecified osteoarthritis, unspecified site: Secondary | ICD-10-CM | POA: Diagnosis not present

## 2014-04-12 DIAGNOSIS — F329 Major depressive disorder, single episode, unspecified: Secondary | ICD-10-CM | POA: Diagnosis not present

## 2014-04-12 DIAGNOSIS — F0390 Unspecified dementia without behavioral disturbance: Secondary | ICD-10-CM | POA: Diagnosis not present

## 2014-04-12 DIAGNOSIS — M5137 Other intervertebral disc degeneration, lumbosacral region: Secondary | ICD-10-CM | POA: Diagnosis not present

## 2014-04-12 DIAGNOSIS — E538 Deficiency of other specified B group vitamins: Secondary | ICD-10-CM | POA: Diagnosis not present

## 2014-04-16 DIAGNOSIS — F0390 Unspecified dementia without behavioral disturbance: Secondary | ICD-10-CM | POA: Diagnosis not present

## 2014-04-16 DIAGNOSIS — Z7982 Long term (current) use of aspirin: Secondary | ICD-10-CM | POA: Diagnosis not present

## 2014-04-16 DIAGNOSIS — Z9181 History of falling: Secondary | ICD-10-CM | POA: Diagnosis not present

## 2014-04-16 DIAGNOSIS — M5137 Other intervertebral disc degeneration, lumbosacral region: Secondary | ICD-10-CM | POA: Diagnosis not present

## 2014-04-16 DIAGNOSIS — E538 Deficiency of other specified B group vitamins: Secondary | ICD-10-CM | POA: Diagnosis not present

## 2014-04-16 DIAGNOSIS — M1990 Unspecified osteoarthritis, unspecified site: Secondary | ICD-10-CM | POA: Diagnosis not present

## 2014-04-16 DIAGNOSIS — F329 Major depressive disorder, single episode, unspecified: Secondary | ICD-10-CM | POA: Diagnosis not present

## 2014-04-16 DIAGNOSIS — F419 Anxiety disorder, unspecified: Secondary | ICD-10-CM | POA: Diagnosis not present

## 2014-04-16 DIAGNOSIS — I10 Essential (primary) hypertension: Secondary | ICD-10-CM | POA: Diagnosis not present

## 2014-04-16 DIAGNOSIS — I2510 Atherosclerotic heart disease of native coronary artery without angina pectoris: Secondary | ICD-10-CM | POA: Diagnosis not present

## 2014-04-23 DIAGNOSIS — M5137 Other intervertebral disc degeneration, lumbosacral region: Secondary | ICD-10-CM | POA: Diagnosis not present

## 2014-04-23 DIAGNOSIS — M1990 Unspecified osteoarthritis, unspecified site: Secondary | ICD-10-CM | POA: Diagnosis not present

## 2014-04-23 DIAGNOSIS — F419 Anxiety disorder, unspecified: Secondary | ICD-10-CM | POA: Diagnosis not present

## 2014-04-23 DIAGNOSIS — E538 Deficiency of other specified B group vitamins: Secondary | ICD-10-CM | POA: Diagnosis not present

## 2014-04-23 DIAGNOSIS — F0390 Unspecified dementia without behavioral disturbance: Secondary | ICD-10-CM | POA: Diagnosis not present

## 2014-04-23 DIAGNOSIS — F329 Major depressive disorder, single episode, unspecified: Secondary | ICD-10-CM | POA: Diagnosis not present

## 2014-04-26 DIAGNOSIS — F0390 Unspecified dementia without behavioral disturbance: Secondary | ICD-10-CM | POA: Diagnosis not present

## 2014-04-26 DIAGNOSIS — F329 Major depressive disorder, single episode, unspecified: Secondary | ICD-10-CM | POA: Diagnosis not present

## 2014-04-26 DIAGNOSIS — F419 Anxiety disorder, unspecified: Secondary | ICD-10-CM | POA: Diagnosis not present

## 2014-04-26 DIAGNOSIS — M5137 Other intervertebral disc degeneration, lumbosacral region: Secondary | ICD-10-CM | POA: Diagnosis not present

## 2014-04-26 DIAGNOSIS — E538 Deficiency of other specified B group vitamins: Secondary | ICD-10-CM | POA: Diagnosis not present

## 2014-04-26 DIAGNOSIS — M1990 Unspecified osteoarthritis, unspecified site: Secondary | ICD-10-CM | POA: Diagnosis not present

## 2014-04-30 DIAGNOSIS — E538 Deficiency of other specified B group vitamins: Secondary | ICD-10-CM | POA: Diagnosis not present

## 2014-04-30 DIAGNOSIS — F419 Anxiety disorder, unspecified: Secondary | ICD-10-CM | POA: Diagnosis not present

## 2014-04-30 DIAGNOSIS — M1990 Unspecified osteoarthritis, unspecified site: Secondary | ICD-10-CM | POA: Diagnosis not present

## 2014-04-30 DIAGNOSIS — F329 Major depressive disorder, single episode, unspecified: Secondary | ICD-10-CM | POA: Diagnosis not present

## 2014-04-30 DIAGNOSIS — F0390 Unspecified dementia without behavioral disturbance: Secondary | ICD-10-CM | POA: Diagnosis not present

## 2014-04-30 DIAGNOSIS — M5137 Other intervertebral disc degeneration, lumbosacral region: Secondary | ICD-10-CM | POA: Diagnosis not present

## 2014-05-01 ENCOUNTER — Ambulatory Visit
Admission: RE | Admit: 2014-05-01 | Discharge: 2014-05-01 | Disposition: A | Discharge status: Home / Self Care | Payer: Medicare Other | Source: Ambulatory Visit

## 2014-05-01 DIAGNOSIS — Z1231 Encounter for screening mammogram for malignant neoplasm of breast: Secondary | ICD-10-CM

## 2014-05-03 DIAGNOSIS — E538 Deficiency of other specified B group vitamins: Secondary | ICD-10-CM | POA: Diagnosis not present

## 2014-05-03 DIAGNOSIS — M5137 Other intervertebral disc degeneration, lumbosacral region: Secondary | ICD-10-CM | POA: Diagnosis not present

## 2014-05-03 DIAGNOSIS — F329 Major depressive disorder, single episode, unspecified: Secondary | ICD-10-CM | POA: Diagnosis not present

## 2014-05-03 DIAGNOSIS — F419 Anxiety disorder, unspecified: Secondary | ICD-10-CM | POA: Diagnosis not present

## 2014-05-03 DIAGNOSIS — F0390 Unspecified dementia without behavioral disturbance: Secondary | ICD-10-CM | POA: Diagnosis not present

## 2014-05-03 DIAGNOSIS — M1990 Unspecified osteoarthritis, unspecified site: Secondary | ICD-10-CM | POA: Diagnosis not present

## 2014-05-07 DIAGNOSIS — F419 Anxiety disorder, unspecified: Secondary | ICD-10-CM | POA: Diagnosis not present

## 2014-05-07 DIAGNOSIS — M1990 Unspecified osteoarthritis, unspecified site: Secondary | ICD-10-CM | POA: Diagnosis not present

## 2014-05-07 DIAGNOSIS — E538 Deficiency of other specified B group vitamins: Secondary | ICD-10-CM | POA: Diagnosis not present

## 2014-05-07 DIAGNOSIS — M5137 Other intervertebral disc degeneration, lumbosacral region: Secondary | ICD-10-CM | POA: Diagnosis not present

## 2014-05-07 DIAGNOSIS — F0390 Unspecified dementia without behavioral disturbance: Secondary | ICD-10-CM | POA: Diagnosis not present

## 2014-05-07 DIAGNOSIS — F329 Major depressive disorder, single episode, unspecified: Secondary | ICD-10-CM | POA: Diagnosis not present

## 2014-05-11 DIAGNOSIS — M5137 Other intervertebral disc degeneration, lumbosacral region: Secondary | ICD-10-CM | POA: Diagnosis not present

## 2014-05-11 DIAGNOSIS — F419 Anxiety disorder, unspecified: Secondary | ICD-10-CM | POA: Diagnosis not present

## 2014-05-11 DIAGNOSIS — F329 Major depressive disorder, single episode, unspecified: Secondary | ICD-10-CM | POA: Diagnosis not present

## 2014-05-11 DIAGNOSIS — E538 Deficiency of other specified B group vitamins: Secondary | ICD-10-CM | POA: Diagnosis not present

## 2014-05-11 DIAGNOSIS — F0390 Unspecified dementia without behavioral disturbance: Secondary | ICD-10-CM | POA: Diagnosis not present

## 2014-05-11 DIAGNOSIS — M1990 Unspecified osteoarthritis, unspecified site: Secondary | ICD-10-CM | POA: Diagnosis not present

## 2014-05-14 DIAGNOSIS — M5137 Other intervertebral disc degeneration, lumbosacral region: Secondary | ICD-10-CM | POA: Diagnosis not present

## 2014-05-14 DIAGNOSIS — M1990 Unspecified osteoarthritis, unspecified site: Secondary | ICD-10-CM | POA: Diagnosis not present

## 2014-05-14 DIAGNOSIS — E538 Deficiency of other specified B group vitamins: Secondary | ICD-10-CM | POA: Diagnosis not present

## 2014-05-14 DIAGNOSIS — F419 Anxiety disorder, unspecified: Secondary | ICD-10-CM | POA: Diagnosis not present

## 2014-05-14 DIAGNOSIS — F0390 Unspecified dementia without behavioral disturbance: Secondary | ICD-10-CM | POA: Diagnosis not present

## 2014-05-14 DIAGNOSIS — F329 Major depressive disorder, single episode, unspecified: Secondary | ICD-10-CM | POA: Diagnosis not present

## 2014-05-16 ENCOUNTER — Telehealth: Payer: Self-pay | Admitting: *Deleted

## 2014-05-16 NOTE — Telephone Encounter (Signed)
I called patient to let her know that we received the request from the pharmacy for her to try Voltaren tablets.  Left message for her that she would have to call PCP for the tablets per Dr. Posey Pronto.  I will fax the request back to the pharmacy to let them know as well.

## 2014-05-17 DIAGNOSIS — M5137 Other intervertebral disc degeneration, lumbosacral region: Secondary | ICD-10-CM | POA: Diagnosis not present

## 2014-05-17 DIAGNOSIS — F419 Anxiety disorder, unspecified: Secondary | ICD-10-CM | POA: Diagnosis not present

## 2014-05-17 DIAGNOSIS — F329 Major depressive disorder, single episode, unspecified: Secondary | ICD-10-CM | POA: Diagnosis not present

## 2014-05-17 DIAGNOSIS — E538 Deficiency of other specified B group vitamins: Secondary | ICD-10-CM | POA: Diagnosis not present

## 2014-05-17 DIAGNOSIS — F0390 Unspecified dementia without behavioral disturbance: Secondary | ICD-10-CM | POA: Diagnosis not present

## 2014-05-17 DIAGNOSIS — M1990 Unspecified osteoarthritis, unspecified site: Secondary | ICD-10-CM | POA: Diagnosis not present

## 2014-05-20 ENCOUNTER — Encounter: Payer: Self-pay | Admitting: Neurology

## 2014-05-20 ENCOUNTER — Ambulatory Visit (INDEPENDENT_AMBULATORY_CARE_PROVIDER_SITE_OTHER): Payer: Medicare Other | Admitting: Neurology

## 2014-05-20 VITALS — BP 110/70 | HR 90 | Wt 162.3 lb

## 2014-05-20 DIAGNOSIS — M47817 Spondylosis without myelopathy or radiculopathy, lumbosacral region: Secondary | ICD-10-CM

## 2014-05-20 DIAGNOSIS — E538 Deficiency of other specified B group vitamins: Secondary | ICD-10-CM | POA: Diagnosis not present

## 2014-05-20 DIAGNOSIS — G608 Other hereditary and idiopathic neuropathies: Secondary | ICD-10-CM

## 2014-05-20 MED ORDER — DICLOFENAC SODIUM 1 % TD GEL: TRANSDERMAL | Status: DC

## 2014-05-20 MED ORDER — CYANOCOBALAMIN 1000 MCG/ML IJ SOLN
1000.0000 ug | Freq: Once | INTRAMUSCULAR | Status: AC
  Administered 2014-05-20: 1000 ug via INTRAMUSCULAR

## 2014-05-20 NOTE — Patient Instructions (Signed)
Continue your medications as you are taking them I will see you back in 4 months

## 2014-05-20 NOTE — Progress Notes (Signed)
[facility]  Follow-up Visit   Date: 05/20/2014   Sarah [name] MRN: [id number] DOB: 02/19/1927   Interim History: Sarah [name] is a 81 y.o. right-handed Caucasian female with history of HTN, HPL, depression, esophageal strictures, and GERD returning to the clinic for follow-up of multifactorial gait instability.  The patient was accompanied to the clinic by [name], her caregiver.    History of present illness: Leg weakness started slowly around May 2014 when she noticed difficulty with walking and tripping on things. She has a hard time describing her walking problems, but says that her legs feel weak. She would stumble frequently, and denies any falls.  She has associated bilateral achy knee pain which feels tight and is worse when she applies pressure on it. Her symptoms steadily worsened. She reports having chronic back pain but had worsening pain in August 2014. Pain is achy located at the low back and does not radiate into her buttocks or legs. Forward flexion makes her pain worse and it is improved by laying down.   Follow-up 01/16/2013:  Neuropathy labs which all returned normal.  EMG was performed and shows moderate peripheral neuropathy of the legs and L3-L4 old radiculopathy.   Follow-up 04/18/2013:  In December, she saw Dr. Tamala Julian for back pain who found SI joint dysfunction and performed injection which some benefit. Because of ongoing leg weakness, she started using a cane in November 2014 and feels much more stable.  She also has a caregiver that comes 10-2p who helps her dress, prepare meals, and does her errands which she appreciates a lot.    Follow-up 10/09/2013:  She had an unwittnessed fall on 6/17 while transferring a few things to her dining table.  She hit the back of her head and bruised her right wrist.  There was no loss of consciousness. Caregiver says that she is more forgetful than usual, especially with conversation.  Since her fall,  she has had intermittent headaches, described as achy, occuring daily and last all day. It usually responds to tylenol.    Follow-up 11/20/2013:  She developed severe right foot pain and she podiatry who injected her foot, which helped slightly. She applies icey hot to her feet which helps.   Follow-up 02/12/2014:  She continues to have of achy knee and foot pain and reports worsening low back pain.   She uses voltaren and tumeric which significantly helps.   No tingling, besides mild tingling involving the base of her foot. No interval falls, but does endorse worsening imbalance.  UPDATE 05/20/2014:  She completed PT and reports being able to climb stairs which was a huge improvement.  No falls or hospitalizations.  She continues to have balance problems and is very cautious when walking.  Denies any shooting, stabbing, or burning pain. She has musculoskeletal pain involving her right upper arm and feet, which responds to voltaren.  Medications:  Current Outpatient Prescriptions on File Prior to Visit  Medication Sig Dispense Refill  . acetaminophen (TYLENOL) 500 MG tablet Take 1 tablet (500 mg total) by mouth every 8 (eight) hours as needed. 180 tablet 2  . ALPRAZolam (XANAX) 1 MG tablet Take 1 tablet (1 mg total) by mouth 2 (two) times daily. 60 tablet 5  . aspirin 81 MG tablet Take 81 mg by mouth daily.    . Cholecalciferol (VITAMIN D PO) Take by mouth. Taking 2000 daily     . clobetasol ointment (TEMOVATE) 2.68 % Apply 1 application topically 2 (  two) times daily.     . cyanocobalamin (,VITAMIN B-12,) 1000 MCG/ML injection INJECT 1 ML INTO THE MUSCLE EVERY 30 DAYS. 10 mL 5  . FLUZONE HIGH-DOSE 0.5 ML SUSY   0  . furosemide (LASIX) 20 MG tablet Take 1 tablet (20 mg total) by mouth as needed. 30 tablet 5  . Glucosamine HCl 750 MG TABS Take by mouth 2 (two) times daily.    Marland Kitchen ibandronate (BONIVA) 150 MG tablet Take 1 tablet (150 mg total) by mouth every 30 (thirty) days. 1 tablet 5  . miconazole  (MONISTAT 7) 100 MG vaginal suppository Place 1 suppository (100 mg total) vaginally at bedtime. 7 suppository 0  . Misc Natural Products (TUMERSAID) TABS Take 1 capsule by mouth 2 (two) times daily. 60 tablet 3  . naproxen sodium (ALEVE) 220 MG tablet Take 220 mg by mouth 3 (three) times daily with meals.    . nitroGLYCERIN (NITROSTAT) 0.4 MG SL tablet Place 1 tablet (0.4 mg total) under the tongue every 5 (five) minutes as needed for chest pain. 100 tablet prn  . nortriptyline (PAMELOR) 25 MG capsule TAKE 1 CAPSULE AT BEDTIME. 30 capsule 5  . pantoprazole (PROTONIX) 40 MG tablet TAKE 1 TABLET ONCE DAILY. 30 tablet 3  . perphenazine (TRILAFON) 2 MG tablet TAKE ONE TABLET AT BEDTIME. 30 tablet 5  . solifenacin (VESICARE) 10 MG tablet Take 1 tablet (10 mg total) by mouth as needed. 30 tablet 5  . triazolam (HALCION) 0.25 MG tablet Take 0.5-1 tablets (0.125-0.25 mg total) by mouth at bedtime as needed for sleep. 30 tablet 5  . [DISCONTINUED] solifenacin (VESICARE) 10 MG tablet Take 10 mg by mouth as needed.      No current facility-administered medications on file prior to visit.    Allergies:  Allergies  Allergen Reactions  . Celebrex [Celecoxib]     rash  . Lipitor [Atorvastatin Calcium]     Leg weakness,memory loss  . Lovastatin     Leg weakness  . Other     Always pads  . Latex Rash     Review of Systems:  CONSTITUTIONAL: No fevers, chills, night sweats, or weight loss.   EYES: No visual changes or eye pain ENT: No hearing changes.  No history of nose bleeds. RESPIRATORY: No cough, wheezing and shortness of breath.   CARDIOVASCULAR: Negative for chest pain, and palpitations.   GI: Negative for abdominal discomfort, blood in stools or black stools.  No recent change in bowel habits.  GU:  No history of incontinence.   MUSCLOSKELETAL: No history of joint pain or swelling.  No myalgias.  + back pain SKIN: Negative for lesions, rash, and itching.   HEMATOLOGY/ONCOLOGY: Negative  for prolonged bleeding, bruising easily, and swollen nodes.   ENDOCRINE: Negative for cold or heat intolerance, polydipsia or goiter.   PSYCH:  No depression or anxiety symptoms.   NEURO: As Above.   Vital Signs:  BP 110/70 mmHg  Pulse 90  Wt 162 lb 5 oz (73.624 kg)  SpO2 96%  Neurological Exam: MENTAL STATUS including orientation to time, place, person, recent and remote memory is fairly intact.  Slightly blunted affect.   CRANIAL NERVES:  Pupils round and reactive to light. Extraocular muscles are intact. Face is symmetric. Palate elevates symmetrically and tongue is midline.  MOTOR:  Motor strength is 5/5 in proximal muscles. Tone is normal throughout. Tenderness to palpation over the knees and ankles, without warmth.  Mild pitting edema of the legs.  Left  great toe with mild erythema, no warm or edema.    MSRs: Reflexes are 2+/4 at knees and absent at Achilles  SENSORY:  Reduced vibration at the ankles bilaterally   Data: XR lumbar spine 06/07/2012:  Curvature convex to the left. Worsening of degenerative disc disease at T12-L1, L1-2 and L4-5.   Labs 12/06/2012:  TSH 5.43 Labs 12/15/2012:  Vitamin B12 426, MMA 0.22, ceruloplasmin 29, copper 114 SPEP/UPEP with IFE - no monoclonal gammopathy Labs 01/16/2013:  HbA1c 5.9  EMG 01/15/2013:  Taken together, these findings are consistent with a moderate generalized, large fiber, sensorimotor polyneuropathy affecting the left side. There is a superimposed intraspinal canal lesion (i.e. radiculopathy) affecting the L2-L4 nerve root/segments bilaterally, moderate in degree electrically.  CT brain 10/15/2013: No skull fracture or intracranial hemorrhage. Fluid level within the right maxillary sinus may be related to sinus  disease without secondary findings of orbital injury. If orbital injury were of clinical concern, CT orbits recommended for further  delineation. Prominent small vessel disease type changes without CT evidence of large  acute infarct.  Mild global atrophy without hydrocephalus.   IMPRESSION/PLAN:   1.  Multifactorial gait instability, clinically stable There are several factors contributing to her gait instability including degenerative arthritis of the lumbosacral region, peripheral neuropathy, and likely neurodegenerative process.  She also has joint-related pain of her knees, ankles, and low back pain. No interval falls, completed PT with benefit  2.  Dementia, likely age related, clinically stable Anticholinergic medication was deferred  3.  Possible parkinsonism  She had stooped posture with shuffling gait on exam and mild bradykinesia suggestive of parkinsonism.  There is no no tremor or rigidity on exam.  Trial of sinemet deferred  4.  Peripheral neuropathy due to vitamin B12 deficiency Clinically without burning/tingling paresthesias of the feet, and describes more "achy pain" which is more suggestive of arthritis  Continue monthly B12, will administer injection today  5. Osteoarthritis, likely causing knee pain and contributing to ankle pain Refill provided for diclofenac gel, encouraged her to f/u with her PCP  6.  Chronic low back pain She has contacted Dr. Darliss Ridgel office for another injection since this seems to provide moderate lasting relief  6. Return to clinic in 68-months   The duration of this appointment visit was 25 minutes of face-to-face time with the patient.  Greater than 50% of this time was spent in counseling, explanation of diagnosis, planning of further management, and coordination of care.   Thank you for allowing me to participate in patient's care.  If I can answer any additional questions, I would be pleased to do so.    Sincerely,    Donika K. Posey Pronto, DO

## 2014-05-24 ENCOUNTER — Other Ambulatory Visit: Payer: Self-pay | Admitting: Internal Medicine

## 2014-05-28 DIAGNOSIS — L218 Other seborrheic dermatitis: Secondary | ICD-10-CM | POA: Diagnosis not present

## 2014-05-28 DIAGNOSIS — L820 Inflamed seborrheic keratosis: Secondary | ICD-10-CM | POA: Diagnosis not present

## 2014-05-29 ENCOUNTER — Ambulatory Visit (INDEPENDENT_AMBULATORY_CARE_PROVIDER_SITE_OTHER): Payer: Medicare Other | Admitting: Family Medicine

## 2014-05-29 ENCOUNTER — Other Ambulatory Visit (INDEPENDENT_AMBULATORY_CARE_PROVIDER_SITE_OTHER): Payer: Medicare Other

## 2014-05-29 ENCOUNTER — Encounter: Payer: Self-pay | Admitting: Family Medicine

## 2014-05-29 VITALS — BP 124/64 | HR 84 | Wt 156.0 lb

## 2014-05-29 DIAGNOSIS — M4698 Unspecified inflammatory spondylopathy, sacral and sacrococcygeal region: Secondary | ICD-10-CM

## 2014-05-29 DIAGNOSIS — M47818 Spondylosis without myelopathy or radiculopathy, sacral and sacrococcygeal region: Secondary | ICD-10-CM

## 2014-05-29 DIAGNOSIS — M25511 Pain in right shoulder: Secondary | ICD-10-CM | POA: Diagnosis not present

## 2014-05-29 NOTE — Progress Notes (Signed)
Pre visit review using our clinic review tool, if applicable. No additional management support is needed unless otherwise documented below in the visit note. 

## 2014-05-29 NOTE — Progress Notes (Signed)
CC: Back pain followup  HPI: Patient is a very pleasant 81 year old female coming in with chronic back pain. Patient was treated for severe sacroiliac joint arthritis. It has been greater than 3 months since patient's last injection. Patient is having significant amount pain again over the course last several weeks. States now that is so severe she does not want to walk. Patient is wondering if she can have another injection.  Patient is also complaining of right shoulder pain. States it is more of a dull throbbing aching sensation that is worse with certain movements. Patient does not remember any recent falls. Patient states that she is more of a pain with certain movements. States that he can wake her up at night. Rates the severity of 8 out of 10.    Past medical, surgical, family and social history reviewed. Medications reviewed all in the electronic medical record.   Review of Systems: No headache, visual changes, nausea, vomiting, diarrhea, constipation, dizziness, abdominal pain, skin rash, fevers, chills, night sweats, weight loss, swollen lymph nodes, body aches, joint swelling, muscle aches, chest pain, shortness of breath, mood changes.   Objective:    Blood pressure 124/64, pulse 84, weight 156 lb (70.761 kg), SpO2 97 %.   General: No apparent distress alert and oriented x3 mood and affect normal, dressed appropriately.  HEENT: Pupils equal, extraocular movements intact Respiratory: Patient's speak in full sentences and does not appear short of breath Cardiovascular: No lower extremity edema, non tender, no erythema Skin: Warm dry intact with no signs of infection or rash on extremities or on axial skeleton. Abdomen: Soft nontender Neuro: Cranial nerves II through XII are intact, neurovascularly intact in all extremities with 2+ DTRs and 2+ pulses. Lymph: No lymphadenopathy of posterior or anterior cervical chain or axillae bilaterally.  Gait walks with a cane mild broad-based  gait MSK: Non tender with full range of motion and good stability and symmetric strength and tone of shoulders, elbows, wrist, hip, and ankles bilaterally. Moderate loss of range of motion of multiple joints secondary to osteoporosis. Shoulder: Right Inspection reveals no abnormalities, atrophy or asymmetry. Palpation is normal with no tenderness over AC joint or bicipital groove. ROM is full in all planes passively. Rotator cuff strength normal throughout. signs of impingement with positive Neer and Hawkin's tests, but negative empty can sign. Speeds and Yergason's tests normal. No labral pathology noted with negative Obrien's, negative clunk and good stability. Normal scapular function observed. No painful arc and no drop arm sign. No apprehension sign Contralateral shoulder has some mild crepitus and impingement as well but not as severe as the right side.  MSK US performed of: Right This study was ordered, performed, and interpreted by Charlann Boxer D.O.  Shoulder:   Supraspinatus:  Degenerative tearing noted mild retraction Infraspinatus:  Appears normal on long and transverse views. Significant increase in Doppler flow Subscapularis:  Appears normal on long and transverse views. Positive bursa Teres Minor:  Appears normal on long and transverse views. AC joint:  Moderate arthritis Glenohumeral Joint:  Moderate osteoarthritis Glenoid Labrum:  Intact without visualized tears. Biceps Tendon:  Appears normal on long and transverse views, no fraying of tendon, tendon located in intertubercular groove, no subluxation with shoulder internal or external rotation.  Impression: Subacromial bursitis with degenerative rotator cuff tendinopathy  Procedure: Real-time Ultrasound Guided Injection of right glenohumeral joint Device: GE Logiq E  Ultrasound guided injection is preferred based studies that show increased duration, increased effect, greater accuracy, decreased procedural pain,  increased response rate with ultrasound guided versus blind injection.  Verbal informed consent obtained.  Time-out conducted.  Noted no overlying erythema, induration, or other signs of local infection.  Skin prepped in a sterile fashion.  Local anesthesia: Topical Ethyl chloride.  With sterile technique and under real time ultrasound guidance:  Joint visualized.  23g 1  inch needle inserted posterior approach. Pictures taken for needle placement. Patient did have injection of 2 cc of 1% lidocaine, 2 cc of 0.5% Marcaine, and 1.0 cc of Kenalog 40 mg/dL. Completed without difficulty  Pain immediately resolved suggesting accurate placement of the medication.  Advised to call if fevers/chills, erythema, induration, drainage, or persistent bleeding.  Images permanently stored and available for review in the ultrasound unit.  Impression: Technically successful ultrasound guided injection.  Back Exam:  Inspection:increase kyphosis as well as some scoliosis Motion: Flexion 35 deg, Extension 25 deg, Side Bending to 25 deg bilaterally,  Rotation to 25 deg bilaterally  SLR laying: pain but no radiation  XSLR laying: Negative  Palpable tenderness: Right SI joint still minimally tender FABER: unable for patient to participate. Sensory change: Gross sensation intact to all lumbar and sacral dermatomes.  Reflexes: 2+ at both patellar tendons, 2+ at achilles tendons, Babinski's downgoing.  Strength at foot  Plantar-flexion: 5/5 Dorsi-flexion: 5/5 Eversion: 5/5 Inversion: 5/5  Leg strength  Quad: 4/5 Hamstring: 4/5 Hip flexor: 4/5 Hip abductors: 3/5  But symmetric to contralateral side.   Procedure Note  After verbal consent patient was prepped with alcohol swabs and with a 23-gauge 1-1/2 inch needle patient was injected with 1 cc of Kenalog 0.5% Marcaine and 1 cc of Kenalog 40 mg/dL into the right SI joint under ultrasound guidance. Patient did tolerate the procedure well. Pictures were saved.  Patient did have minimal bleeding. Placed Band-Aid over injection site. Post injection instructions   Previous imaging studies XR lumbar spine 06/07/2012: Findings: There is curvature convex to the left. There is advanced degenerative disc disease at T12-L1, L1-2 and L4-5. The L5-S1 level is transitional. There is anterolisthesis of 3 mm of L4-5.  There is retrolisthesis of 3 mm at T12-L1 and L1-2. These degenerative changes have worsened since 2010. No evidence of fracture.  IMPRESSION:  Curvature convex to the left. Worsening of degenerative disc disease at T12-L1, L1-2 and L4-5.    Impression and Recommendations:     This case required medical decision making of moderate complexity.

## 2014-05-29 NOTE — Assessment & Plan Note (Signed)
Patient was given an injection today and tolerated the procedure very well with good resolution of pain. We discussed continuing the same medications at this time as well as the continue the supplementations. We discussed continuing the range of motion exercises which she is doing 2 times a week. Patient will try an icing protocol as well more regularly and will come back and see me again in her regular frequency. Patient knows that we can repeat this injection every 3 months safely.

## 2014-05-29 NOTE — Patient Instructions (Signed)
Good to see you Ice still would be the best  10 minutes 2 times a day Vitamin D 2000 IU dail;y Keep all my goodies (vitamins) going See m egaian in 3 months or earlier if you need the knees done.

## 2014-05-29 NOTE — Assessment & Plan Note (Signed)
Patient's right shoulder pain seems to be more rotator cuff tendinopathy. Patient was given an injection today. We discussed home exercises and patient was given a handout. We discussed icing protocol. Patient will come back and see me again in 3 weeks if continuing have discomfort and will get further imaging.

## 2014-06-07 ENCOUNTER — Telehealth: Payer: Self-pay | Admitting: Internal Medicine

## 2014-06-07 NOTE — Telephone Encounter (Signed)
[facility] [place] [facility]     Patient Name: [facility]    Client Site [facility]    Physician Gwendolyn Grant     Contact Type Call    Call Type Triage / Clinical         Relationship To Patient Self    Appointment Disposition EMR Appointment Not Necessary    Return Phone Number [phone] (Primary)  Gender: Female Chief Complaint CHEST PAIN (>=21 years) - pain, pressure, heaviness or tightness  DOB: [birth year]  Initial Comment Caller states they are having a dull pain under her left breast.   Age: 81 Y 60 M 8 D    Return Phone Number: [phone] (Primary) PreDisposition Did not know what to do  Address: [place]     City/State/Zip: [place] [address] Info pasted into Epic Yes    Nurse Assessment  Nurse: Venetia Maxon, RN, Manuela Schwartz Date/Time (Eastern Time): 06/07/2014 3:56:01 PM  Confirm and document reason for call. If symptomatic, describe symptoms. ---Caller states they are having a dull pain under her left breast. For a few hours. She is not SOB> no fever   Has the patient traveled out of the country within the last 30 days? ---No   Does the patient require triage? ---Yes   Related visit to physician within the last 2 weeks? ---No   Does the PT have any chronic conditions? (i.e. diabetes, asthma, etc.) ---Yes   List chronic conditions. ---alprazolam esophagus stricture pantoprazole nortryptaline halcion     Guidelines      Guideline Title Affirmed Question Affirmed Notes Nurse Date/Time Eilene Ghazi Time)  Chest Pain [1] Chest pain lasts > 5 minutes AND [2] age > 27  Venetia Maxon, RN, Manuela Schwartz 06/07/2014 3:58:56 PM  Disp. Time Eilene Ghazi Time) Disposition Final User   06/07/2014 3:51:10 PM Send to Urgent Alean Rinne   06/07/2014 4:02:58 PM 911 Follow Up Call Attempted  Venetia Maxon, RN, Manuela Schwartz    Reason: no answer to call back       06/07/2014 4:00:23 PM Call EMS 911 Now Yes Venetia Maxon, RN, Edwena Bunde Understands: Yes   Disagree/Comply: Comply      Care Advice Given Per Guideline         CALL EMS 911 NOW: Immediate medical attention is needed. You need to hang up and call 911 (or an ambulance). Psychologist, forensic Discretion: I'll call you back in a few minutes to be sure you were able to reach them.) CARE ADVICE given per Chest Pain (Adult) guideline.

## 2014-06-12 ENCOUNTER — Encounter: Payer: Self-pay | Admitting: Internal Medicine

## 2014-06-12 ENCOUNTER — Telehealth: Payer: Self-pay | Admitting: *Deleted

## 2014-06-12 ENCOUNTER — Ambulatory Visit: Payer: Medicare Other | Admitting: Family Medicine

## 2014-06-12 ENCOUNTER — Other Ambulatory Visit (INDEPENDENT_AMBULATORY_CARE_PROVIDER_SITE_OTHER): Payer: Medicare Other

## 2014-06-12 ENCOUNTER — Ambulatory Visit (INDEPENDENT_AMBULATORY_CARE_PROVIDER_SITE_OTHER): Payer: Medicare Other | Admitting: Internal Medicine

## 2014-06-12 VITALS — BP 150/74 | HR 97 | Temp 98.5°F | Resp 16 | Ht 61.0 in | Wt 157.5 lb

## 2014-06-12 DIAGNOSIS — R079 Chest pain, unspecified: Secondary | ICD-10-CM | POA: Diagnosis not present

## 2014-06-12 LAB — TROPONIN I: TNIDX: 0 ug/l (ref 0.00–0.06)

## 2014-06-12 NOTE — Telephone Encounter (Signed)
PLEASE NOTE: All timestamps contained within this report are represented as [place] Standard Time. CONFIDENTIALTY NOTICE: This fax transmission is intended only for the addressee. It contains information that is legally privileged, confidential or otherwise protected from use or disclosure. If you are not the intended recipient, you are strictly prohibited from reviewing, disclosing, copying using or disseminating any of this information or taking any action in reliance on or regarding this information. If you have received this fax in error, please notify us immediately by telephone so that we can arrange for its return to [name]. Phone: [phone], Toll-Free: [phone], Fax: [phone] Page: 1 of 2 Call Id: [id number] [facility] - Client [place] Patient Name: Sarah [name] DOB: 1926/11/20 Initial Comment Caller states she was having pains yesterday under her arm and in the chest area. She took a gas ex and then the pain went away and she is not having any pain now. She states it is not her heart. Nurse Assessment Nurse: Mechele Dawley, RN, Amy Date/Time Eilene Ghazi Time): 06/12/2014 11:35:23 AM Confirm and document reason for call. If symptomatic, describe symptoms. ---CALLER STATES THAT SHE WAS HAVING SOME GAS PAINS YESTERDAY AND SHE TOOK A GAS EX WAFER UNDER HER TONGUE YESTERDAY AND THE PAIN WENT AWAY. THE PAIN WAS ON HER RIGHT SIDE. THE PAIN WAS PRETTY SEVERE AT TIMES. SHE DONT THINK THAT SHE ATE ANYTHING TO CAUSE THE GAS PAINS. SHE HAS NEVER EXPERIENCED ANYTHING LIKE THAT BEFORE. SHE HAS NEVER HAD ANY PROBLEMS WITH HER HEART. SHE STATES, HOWEVER ACCORDING TO HER RECORDS SHE HAS CALLED IN FOR CHEST PAIN ON LEFT SIDE AND TOOK THE GAS EX WAFER AND IT CAUSED IT TO GO AWAY. SHE WILL SEE CARDIOLOGY ON MAY 13TH. SHE STATES SHE HAS ALWAYS HAD A CLEAR BILL. WHEN SHE SPEAKS OF CARDIOLOGY. SHE IS STATING THAT SHE DOES NOT FEEL IT IS  NECESSARY TO HAVE AN APPT SINCE HER GAS EX WORKS SO BEAUTIFULLY SHE STATES. SHE DENIES ANY PAINS AT THIS PRESENT TIME. Has the patient traveled out of the country within the last 30 days? ---Not Applicable Does the patient require triage? ---Yes Related visit to physician within the last 2 weeks? ---No Does the PT have any chronic conditions? (i.e. diabetes, asthma, etc.) ---No Guidelines Guideline Title Affirmed Question Affirmed Notes Chest Pain [1] Chest pain lasting <= 5 minutes AND [2] NO chest pain or cardiac symptoms now (Exceptions: pains lasting a few seconds) Chest Pain Patient sounds very sick or weak to the triager SHE'S HAS BEEN HAVING CHEST PAINS ON RIGHT AND LEFT SIDE FOR THE PAST 24 HOURS Final Disposition User Go to ED Now (or PCP triage) [place], RN, Amy Comments TRIAGE NURSE WENT IN TO SCHEDULE APPT. UNABLE TO GET AN APPT SOON ENOUGH WITH HER PCP. CALLED HER BACK TO SEE IF WOULD HAVE HER PERMISSION PLEASE NOTE: All timestamps contained within this report are represented as [place] Standard Time. CONFIDENTIALTY NOTICE: This fax transmission is intended only for the addressee. It contains information that is legally privileged, confidential or otherwise protected from use or disclosure. If you are not the intended recipient, you are strictly prohibited from reviewing, disclosing, copying using or disseminating any of this information or taking any action in reliance on or regarding this information. If you have received this fax in error, please notify us immediately by telephone so that we can arrange for its return to [name]. Phone: [phone], Toll-Free: [phone], Fax: [phone] Page: 2 of 2 Call Id: [id number]  Comments TO SCHEDULE HER WITH ANOTHER PCP. SHE STATES THAT SINCE SHE SPOKE TO NURSE THAT SHE DID HAVE ANOTHER PAIN UNDER HER RIGHT BREAST AND WAS JUST A TWINGE. SHE STATES THAT SHE DOES NOT WANT TO GO INTO THE ED WHEN NURSE ASK HER. SHE STATES THAT  SHE WANTS TO TAKE A GAS EX AND CALL NURSE BACK. INFORMED HER WOULD PLACE HER IN HER P QUEUE AND CALL HER BACK IN ABOUT 45 MINUTES TO SEE HOW SHE IS DOING. FOLLOW UP CALL: [phone] - CALLED PATIENT BACK AND SHE STATES THAT SINCE CALL PREVIOUSLY THAT SHE'S HAD ABOUT 4 MORE PAINS ON THE RIGHT SIDE OF HER CHEST THAT SHE DESCRIBES AS MED TO SHARP PAINS THAT LAST ABOUT 5 SECONDS AT THE MOST. TRIAGE NURSE ASK HER IF SHE WOULD GO INTO THE ED SINCE SHE HAS BEEN HAVING THESE PAINS THIS TIME AND THE GAS EX WAFER HAS NOT TAKEN THE PAIN AWAY LIKE IT DID BEFORE. SHE STATES THAT SHE DOES NOT WANT TO GO INTO THE ED AND SHE WOULD LIKE TO JUST WAIT AND SEE. SHE STATES THAT YOU JUST HAVE TO WAIT SO LONG AND THEY WILL JUST SIT YOU THERE FOR 3-4 HOURS AND SHE JUST DOES NOT WANT TO DO THAT. ASK HER IF SHE WOULD DO AN APPT AND SHE STATES THAT SHE WOULD. INFORMED HER THAT WOULD PUT THE INFORMATION IN THE COMPUTER AND THAT SOMEONE WOULD CALL HER BACK FROM THE OFFICE THIS AFTERNOON. SHE STATES THAT SHE WILL GO AHEAD AND GET READY IN CASE THEY WILL WANT TO SEE HER TODAY. CALLED OFFICE TO REPORT THIS INFORMATION TO TAMARA AT THE OFFICE. THEY WILL SEND EMS TO CHECK THE PATIENT OUT SINCE SHE DOES NOT WANT TO GO TO THE ED FOR EVALUATION.

## 2014-06-12 NOTE — Telephone Encounter (Signed)
Spoke to [facility]. Gathered more information regarding pt condition.   Called pt to confirm sx.  Pt stated that she had the same sx yesterday and that gasx helped. Today the pain was the same as yesterday and pt took gas x but it took longer this time.   Pain was under right breast and lasted for appx 5 seconds, has only taken 1 gas x pill today so far---pain was scored 6/10--also had slight headache this morning when she got up  Patient did not want to call 911 or go to ED, we have called the [place] office to arrange 4pm appt with dr Sharlene Motts so patient can be seen today  [name]) is caregiver at home with patient and she will be carrying patient to [place] office to see dr Sharlene Motts.  Patient was advised to call 911 if any other symptoms occur or worsen.

## 2014-06-12 NOTE — Progress Notes (Signed)
Pre visit review using our clinic review tool, if applicable. No additional management support is needed unless otherwise documented below in the visit note. 

## 2014-06-12 NOTE — Telephone Encounter (Signed)
[facility] - Client [place] [facility] Patient Name: Sarah [name] [facility] Gender: Female DOB: 29-Apr-1926 Age: 81 Y 9 M 8 D Return Phone Number: [phone] (Primary) Address: [address] City/State/Zip: [place] [address] Client [facility] - Client Client Site [place] - Day Physician Gwendolyn Grant Contact Type Call Call Type Triage / Clinical Relationship To Patient Self Appointment Disposition EMR Appointment Not Necessary Return Phone Number [phone] (Primary) Chief Complaint CHEST PAIN (>=21 years) - pain, pressure, heaviness or tightness Initial Comment Caller states they are having a dull pain under her left breast. PreDisposition Did not know what to do Info pasted into Epic Yes Nurse Assessment Nurse: Venetia Maxon, RN, Manuela Schwartz Date/Time (Eastern Time): 06/07/2014 3:56:01 PM Confirm and document reason for call. If symptomatic, describe symptoms. ---Caller states they are having a dull pain under her left breast. For a few hours. She is not SOB> no fever Has the patient traveled out of the country within the last 30 days? ---No Does the patient require triage? ---Yes Related visit to physician within the last 2 weeks? ---No Does the PT have any chronic conditions? (i.e. diabetes, asthma, etc.) ---Yes List chronic conditions. ---alprazolam esophagus stricture pantoprazole nortryptaline halcion Guidelines Guideline Title Affirmed Question Affirmed Notes Nurse Date/Time Eilene Ghazi Time) Chest Pain [1] Chest pain lasts > 5 minutes AND [2] age > 89 Venetia Maxon, RN, Manuela Schwartz 06/07/2014 3:58:56 PM Disp. Time Eilene Ghazi Time) Disposition Final User 06/07/2014 3:51:10 PM Send to Urgent Alean Rinne 06/07/2014 4:02:58 PM 911 Follow Up Call Attempted Venetia Maxon, RN, Manuela Schwartz Reason: no answer to call back 06/07/2014 4:00:23 PM Call EMS 911 Now Yes Venetia Maxon, RN, Manuela Schwartz PLEASE NOTE: All  timestamps contained within this report are represented as [place] Standard Time. CONFIDENTIALTY NOTICE: This fax transmission is intended only for the addressee. It contains information that is legally privileged, confidential or otherwise protected from use or disclosure. If you are not the intended recipient, you are strictly prohibited from reviewing, disclosing, copying using or disseminating any of this information or taking any action in reliance on or regarding this information. If you have received this fax in error, please notify us immediately by telephone so that we can arrange for its return to [name]. Phone: [phone], Toll-Free: [phone], Fax: [phone] Page: 2 of 2 Call Id: [id number] Caller Understands: Yes Disagree/Comply: Comply Care Advice Given Per Guideline CALL EMS 911 NOW: Immediate medical attention is needed. You need to hang up and call 911 (or an ambulance). Psychologist, forensic Discretion: I'll call you back in a few minutes to be sure you were able to reach them.) CARE ADVICE given per Chest Pain (Adult) guideline. After Care Instructions Given Call Event Type User Date / Time Description

## 2014-06-12 NOTE — Patient Instructions (Addendum)
Gas-X treats reflux. Reflux of gastric acid may be asymptomatic as this may occur mainly during sleep.The triggers for reflux  include stress; the "aspirin family" ; alcohol; peppermint; and caffeine (coffee, tea, cola, and chocolate). The aspirin family would include aspirin and the nonsteroidal agents such as ibuprofen &  Naproxen. Tylenol would not cause reflux. If having symptoms ; food & drink should be avoided for @ least 2 hours before going to bed.    Please start Xarelto 15 mg twice a day ( Lot [id number] [id number];Exp 10/17)until we have ruled out a clot in the leg or lung beginning after evening meal. NO aspirin ,Aleve,  Advil type drugs while on this.

## 2014-06-12 NOTE — Telephone Encounter (Signed)
[facility] - Client [facility] Patient Name: Sarah [name] [facility] Gender: Female DOB: [birth year] Age: 81 Y 9 M 13 D Return Phone Number: [phone] (Primary) Address: [address] City/State/Zip: [place] [address] Client [facility] - Client Client Site [place] - Day Physician Gwendolyn Grant Contact Type Call Call Type Triage / Clinical Relationship To Patient Self Appointment Disposition EMR Appointment Attempted - Not Scheduled Info pasted into Epic Yes Return Phone Number [phone] (Primary) Chief Complaint Chest Pain (non urgent symptoms) Initial Comment Caller states she was having pains yesterday under her arm and in the chest area. She took a gas ex and then the pain went away and she is not having any pain now. She states it is not her heart. PreDisposition Did not know what to do Nurse Assessment Nurse: Mechele Dawley, RN, Amy Date/Time Eilene Ghazi Time): 06/12/2014 11:35:23 AM Confirm and document reason for call. If symptomatic, describe symptoms. ---CALLER STATES THAT SHE WAS HAVING SOME GAS PAINS YESTERDAY AND SHE TOOK A GAS EX WAFER UNDER HER TONGUE YESTERDAY AND THE PAIN WENT AWAY. THE PAIN WAS ON HER RIGHT SIDE. THE PAIN WAS PRETTY SEVERE AT TIMES. SHE DON'T THINK THAT SHE ATE ANYTHING TO CAUSE THE GAS PAINS. SHE HAS NEVER EXPERIENCED ANYTHING LIKE THAT BEFORE. SHE HAS NEVER HAD ANY PROBLEMS WITH HER HEART. SHE STATES, HOWEVER ACCORDING TO HER RECORDS SHE HAS CALLED IN FOR CHEST PAIN ON LEFT SIDE AND TOOK THE GAS EX WAFER AND IT CAUSED IT TO GO AWAY. SHE WILL SEE CARDIOLOGY ON MAY 13TH. SHE STATES SHE HAS "ALWAYS HAD A CLEAR BILL." WHEN SHE SPEAKS OF CARDIOLOGY. 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 SHE DENIES ANY PAINS AT THIS PRESENT TIME. Has the patient traveled out of the  country within the last 30 days? ---Not Applicable Does the patient require triage? ---Yes Related visit to physician within the last 2 weeks? ---No Does the PT have any chronic conditions? (i.e. diabetes, asthma, etc.) ---No PLEASE NOTE: All timestamps contained within this report are represented as [place] Standard Time. CONFIDENTIALTY NOTICE: This fax transmission is intended only for the addressee. It contains information that is legally privileged, confidential or otherwise protected from use or disclosure. If you are not the intended recipient, you are strictly prohibited from reviewing, disclosing, copying using or disseminating any of this information or taking any action in reliance on or regarding this information. If you have received this fax in error, please notify us immediately by telephone so that we can arrange for its return to [name]. Phone: [phone], Toll-Free: [phone], Fax: [phone] Page: 2 of 3 Call Id: [id number] Guidelines Guideline Title Affirmed Question Affirmed Notes Nurse Date/Time Eilene Ghazi Time) Chest Pain [1] Chest pain lasting <= 5 minutes AND [2] NO chest pain or cardiac symptoms now (Exceptions: pains lasting a few seconds) Mechele Dawley, RN, Amy 06/12/2014 11:36:41 AM Chest Pain Patient sounds very sick or weak to the triager SHE'S HAS BEEN HAVING CHEST PAINS ON RIGHT AND LEFT SIDE FOR THE PAST [address] RN, Amy 06/12/2014 12:51:26 PM Disp. Time Eilene Ghazi Time) Disposition Final User 06/12/2014 11:40:32 AM See Physician within [place], [place], Amy 06/12/2014 11:56:00 AM Send To RN Personal Mechele Dawley, RN, Amy 06/12/2014 12:53:59 PM Go to ED Now (or PCP triage) Yes Mechele Dawley, RN, Amy Caller Understands: Yes Disagree/Comply: Compl

## 2014-06-12 NOTE — Progress Notes (Signed)
   Subjective:    Patient ID: Sarah [name], female    DOB: 07-18-1926, 81 y.o.   MRN: [id number]  HPI She's had recurrent lateral, mid axillary line chest pain since 06/10/14. Initially this was on the left; on 3/15 it was in the right axillary area. It is intermittent, lasting seconds but recurrent for up to an hour at a time. It's sharp without radiation and without associated nausea or sweating. It occurs at rest. There are no exacerbating factors. Gas-X has been of benefit.  She denies any other cardiopulmonary, GI, or bleeding dyscrasias.  She has no history of deep venous thrombosis. She does have a history of neuropathy.  Review of Systems  Palpitations, tachycardia, exertional dyspnea, paroxysmal nocturnal dyspnea, claudication or edema are absent.  Unexplained weight loss, abdominal pain, significant dyspepsia, dysphagia, melena, rectal bleeding, or persistently small caliber stools are denied.  Epistaxis , hemoptysis, or hematuria denied.  There is no abnormal bruising , bleeding, or difficulty stopping bleeding with injury.     Objective:   Physical Exam  Positive or pertinent findings: Slight tenting is noted.  She has scattered myriad keratoses.  There is asymmetry of the posterior thoracic musculature suggesting occult scoliosis.  There is suggestion of a positive Homans in the right lower extremity.  Posterior tibial pulses are decreased.   General appearance :adequately nourished; in no distress. Eyes: No conjunctival inflammation or scleral icterus is present. Oral exam:  Lips and gums are healthy appearing.There is no oropharyngeal erythema or exudate noted. Dental hygiene is good. Heart:  Normal rate and regular rhythm. S1 and S2 normal without gallop, murmur, click, rub or other extra sounds   Lungs:Chest clear to auscultation; no wheezes, rhonchi,rales ,or rubs present.No increased work of breathing.  Abdomen: bowel sounds normal, soft and non-tender without  masses, organomegaly or hernias noted.  No guarding or rebound.  Vascular : all pulses equal ; no bruits present. Skin:Warm & dry.  Intact without suspicious lesions or rashes ; no  jaundice  Lymphatic: No lymphadenopathy is noted about the head, neck, axilla Neuro: Strength, tone normal.        Assessment & Plan:  1 atypical bilateral chest pain. EKG is normal  #2 positive Homans right lower extremity  Plan: See orders recommendations

## 2014-06-13 ENCOUNTER — Telehealth: Payer: Self-pay | Admitting: Cardiology

## 2014-06-13 LAB — D-DIMER, QUANTITATIVE (NOT AT ARMC): D DIMER QUANT: 0.27 ug{FEU}/mL (ref 0.00–0.48)

## 2014-06-13 NOTE — Telephone Encounter (Signed)
New message       Want Dr Mare Ferrari to recommend PCP.  Her PCP is leaving.

## 2014-06-13 NOTE — Telephone Encounter (Signed)
Discuss with  Dr. Mare Ferrari Friday

## 2014-06-14 NOTE — Telephone Encounter (Signed)
Discussed with  Dr. Mare Ferrari and ok to see Dr Doug Sou since Dr Asa Lente not working out secondary to Dr Maryclare Labrador schedule change

## 2014-06-17 ENCOUNTER — Telehealth: Payer: Self-pay | Admitting: Cardiology

## 2014-06-17 NOTE — Telephone Encounter (Signed)
New message     Patient calling need to know the name of physician she will be seeing .  Since Dr. Asa Lente is cutting back.

## 2014-06-17 NOTE — Telephone Encounter (Signed)
Spoke with patient and gave her Dr Doug Sou name again

## 2014-06-19 ENCOUNTER — Ambulatory Visit: Payer: Medicare Other | Admitting: Podiatry

## 2014-06-24 ENCOUNTER — Ambulatory Visit (INDEPENDENT_AMBULATORY_CARE_PROVIDER_SITE_OTHER): Payer: Medicare Other | Admitting: Podiatry

## 2014-06-24 ENCOUNTER — Encounter: Payer: Self-pay | Admitting: Podiatry

## 2014-06-24 ENCOUNTER — Ambulatory Visit (INDEPENDENT_AMBULATORY_CARE_PROVIDER_SITE_OTHER): Payer: Medicare Other | Admitting: *Deleted

## 2014-06-24 DIAGNOSIS — E538 Deficiency of other specified B group vitamins: Secondary | ICD-10-CM | POA: Diagnosis not present

## 2014-06-24 DIAGNOSIS — L600 Ingrowing nail: Secondary | ICD-10-CM

## 2014-06-24 DIAGNOSIS — L84 Corns and callosities: Secondary | ICD-10-CM | POA: Diagnosis not present

## 2014-06-24 MED ORDER — CYANOCOBALAMIN 1000 MCG/ML IJ SOLN
1000.0000 ug | Freq: Once | INTRAMUSCULAR | Status: AC
  Administered 2014-06-24: 1000 ug via INTRAMUSCULAR

## 2014-06-25 NOTE — Progress Notes (Signed)
Subjective:     Patient ID: Sarah [name], female   DOB: May 24, 1926, 81 y.o.   MRN: [id number]  HPI patient presents with pain on the right big toe stating that this corn is really bothering her and making it hard to walk   Review of Systems     Objective:   Physical Exam Neurovascular status unchanged with keratotic lesion noted that's painful when pressed    Assessment:     Lesion secondary to pressure    Plan:     Debridement painful lesion applied sterile dressing and reappoint as needed

## 2014-07-02 ENCOUNTER — Telehealth: Payer: Self-pay | Admitting: *Deleted

## 2014-07-02 NOTE — Telephone Encounter (Signed)
Patient called this morning through the after hours service stating she got up this morning with slurred speech. Patient thinks it is a reaction to her medication ( Voltaren). She was advised to call EMS.  Called to follow up with patient and did not get an answer also called Zacarias Pontes and Elvina Sidle and was unable to locate patient. LMOM for a return call

## 2014-07-03 ENCOUNTER — Encounter (HOSPITAL_COMMUNITY): Payer: Self-pay | Admitting: Emergency Medicine

## 2014-07-03 ENCOUNTER — Emergency Department (HOSPITAL_COMMUNITY): Payer: Medicare Other

## 2014-07-03 ENCOUNTER — Inpatient Hospital Stay (HOSPITAL_COMMUNITY)
Admission: EM | Admit: 2014-07-03 | Discharge: 2014-07-05 | DRG: 065 | Disposition: A | Discharge status: Home Health | Payer: Medicare Other | Attending: Internal Medicine | Admitting: Internal Medicine

## 2014-07-03 ENCOUNTER — Telehealth: Payer: Self-pay | Admitting: *Deleted

## 2014-07-03 ENCOUNTER — Telehealth: Payer: Self-pay | Admitting: Neurology

## 2014-07-03 DIAGNOSIS — I6789 Other cerebrovascular disease: Secondary | ICD-10-CM | POA: Diagnosis not present

## 2014-07-03 DIAGNOSIS — R519 Headache, unspecified: Secondary | ICD-10-CM

## 2014-07-03 DIAGNOSIS — R000 Tachycardia, unspecified: Secondary | ICD-10-CM | POA: Diagnosis not present

## 2014-07-03 DIAGNOSIS — Z91040 Latex allergy status: Secondary | ICD-10-CM

## 2014-07-03 DIAGNOSIS — Z9049 Acquired absence of other specified parts of digestive tract: Secondary | ICD-10-CM | POA: Diagnosis present

## 2014-07-03 DIAGNOSIS — R4701 Aphasia: Secondary | ICD-10-CM | POA: Diagnosis not present

## 2014-07-03 DIAGNOSIS — G629 Polyneuropathy, unspecified: Secondary | ICD-10-CM | POA: Diagnosis present

## 2014-07-03 DIAGNOSIS — R51 Headache: Secondary | ICD-10-CM | POA: Diagnosis not present

## 2014-07-03 DIAGNOSIS — R4781 Slurred speech: Secondary | ICD-10-CM | POA: Diagnosis not present

## 2014-07-03 DIAGNOSIS — I639 Cerebral infarction, unspecified: Secondary | ICD-10-CM | POA: Diagnosis not present

## 2014-07-03 DIAGNOSIS — K635 Polyp of colon: Secondary | ICD-10-CM | POA: Diagnosis present

## 2014-07-03 DIAGNOSIS — E875 Hyperkalemia: Secondary | ICD-10-CM | POA: Diagnosis present

## 2014-07-03 DIAGNOSIS — G939 Disorder of brain, unspecified: Secondary | ICD-10-CM

## 2014-07-03 DIAGNOSIS — Z888 Allergy status to other drugs, medicaments and biological substances status: Secondary | ICD-10-CM

## 2014-07-03 DIAGNOSIS — E538 Deficiency of other specified B group vitamins: Secondary | ICD-10-CM | POA: Diagnosis present

## 2014-07-03 DIAGNOSIS — I872 Venous insufficiency (chronic) (peripheral): Secondary | ICD-10-CM | POA: Diagnosis present

## 2014-07-03 DIAGNOSIS — M810 Age-related osteoporosis without current pathological fracture: Secondary | ICD-10-CM | POA: Diagnosis present

## 2014-07-03 DIAGNOSIS — I63511 Cerebral infarction due to unspecified occlusion or stenosis of right middle cerebral artery: Secondary | ICD-10-CM | POA: Diagnosis not present

## 2014-07-03 DIAGNOSIS — Z7982 Long term (current) use of aspirin: Secondary | ICD-10-CM

## 2014-07-03 DIAGNOSIS — E785 Hyperlipidemia, unspecified: Secondary | ICD-10-CM | POA: Diagnosis present

## 2014-07-03 DIAGNOSIS — I10 Essential (primary) hypertension: Secondary | ICD-10-CM | POA: Diagnosis present

## 2014-07-03 DIAGNOSIS — J984 Other disorders of lung: Secondary | ICD-10-CM | POA: Diagnosis not present

## 2014-07-03 DIAGNOSIS — E871 Hypo-osmolality and hyponatremia: Secondary | ICD-10-CM | POA: Diagnosis present

## 2014-07-03 DIAGNOSIS — K222 Esophageal obstruction: Secondary | ICD-10-CM | POA: Diagnosis present

## 2014-07-03 DIAGNOSIS — F329 Major depressive disorder, single episode, unspecified: Secondary | ICD-10-CM | POA: Diagnosis present

## 2014-07-03 DIAGNOSIS — M1990 Unspecified osteoarthritis, unspecified site: Secondary | ICD-10-CM | POA: Diagnosis present

## 2014-07-03 DIAGNOSIS — K219 Gastro-esophageal reflux disease without esophagitis: Secondary | ICD-10-CM | POA: Diagnosis present

## 2014-07-03 DIAGNOSIS — K5900 Constipation, unspecified: Secondary | ICD-10-CM | POA: Diagnosis present

## 2014-07-03 DIAGNOSIS — M25511 Pain in right shoulder: Secondary | ICD-10-CM | POA: Diagnosis present

## 2014-07-03 HISTORY — DX: Cerebral infarction, unspecified: I63.9

## 2014-07-03 LAB — BASIC METABOLIC PANEL
Anion gap: 10 (ref 5–15)
BUN: 14 mg/dL (ref 6–23)
CHLORIDE: 93 mmol/L — AB (ref 96–112)
CO2: 27 mmol/L (ref 19–32)
Calcium: 9 mg/dL (ref 8.4–10.5)
Creatinine, Ser: 0.71 mg/dL (ref 0.50–1.10)
GFR calc Af Amer: 89 mL/min — ABNORMAL LOW (ref >90)
GFR calc non Af Amer: 76 mL/min — ABNORMAL LOW (ref >90)
Glucose, Bld: 108 mg/dL — ABNORMAL HIGH (ref 70–99)
Potassium: 4 mmol/L (ref 3.5–5.1)
Sodium: 130 mmol/L — ABNORMAL LOW (ref 135–145)

## 2014-07-03 LAB — CBC
HCT: 39.2 % (ref 36.0–46.0)
Hemoglobin: 12.9 g/dL (ref 12.0–15.0)
MCH: 31.6 pg (ref 26.0–34.0)
MCHC: 32.9 g/dL (ref 30.0–36.0)
MCV: 96.1 fL (ref 78.0–100.0)
Platelets: 380 10*3/uL (ref 150–400)
RBC: 4.08 MIL/uL (ref 3.87–5.11)
RDW: 12.9 % (ref 11.5–15.5)
WBC: 5.1 10*3/uL (ref 4.0–10.5)

## 2014-07-03 LAB — URINE MICROSCOPIC-ADD ON

## 2014-07-03 LAB — URINALYSIS, ROUTINE W REFLEX MICROSCOPIC
BILIRUBIN URINE: NEGATIVE
Glucose, UA: NEGATIVE mg/dL
Hgb urine dipstick: NEGATIVE
Ketones, ur: NEGATIVE mg/dL
Nitrite: NEGATIVE
PH: 7 (ref 5.0–8.0)
Protein, ur: NEGATIVE mg/dL
Specific Gravity, Urine: 1.009 (ref 1.005–1.030)
UROBILINOGEN UA: 0.2 mg/dL (ref 0.0–1.0)

## 2014-07-03 LAB — TROPONIN I

## 2014-07-03 MED ORDER — STROKE: EARLY STAGES OF RECOVERY BOOK
Freq: Once | Status: AC
  Administered 2014-07-04: 01:00:00
  Filled 2014-07-03: qty 1

## 2014-07-03 MED ORDER — HEPARIN SODIUM (PORCINE) 5000 UNIT/ML IJ SOLN
5000.0000 [IU] | Freq: Three times a day (TID) | INTRAMUSCULAR | Status: DC
  Administered 2014-07-04 – 2014-07-05 (×5): 5000 [IU] via SUBCUTANEOUS
  Filled 2014-07-03 (×7): qty 1

## 2014-07-03 MED ORDER — ACETAMINOPHEN 500 MG PO TABS
500.0000 mg | ORAL_TABLET | Freq: Three times a day (TID) | ORAL | Status: DC | PRN
Start: 2014-07-03 — End: 2014-07-05
  Administered 2014-07-04 – 2014-07-05 (×3): 500 mg via ORAL
  Filled 2014-07-03 (×3): qty 1

## 2014-07-03 MED ORDER — PERPHENAZINE 2 MG PO TABS
2.0000 mg | ORAL_TABLET | Freq: Every day | ORAL | Status: DC
Start: 2014-07-03 — End: 2014-07-05
  Administered 2014-07-03 – 2014-07-04 (×2): 2 mg via ORAL
  Filled 2014-07-03 (×3): qty 1

## 2014-07-03 MED ORDER — ASPIRIN 81 MG PO TABS
81.0000 mg | ORAL_TABLET | Freq: Every day | ORAL | Status: DC
Start: 2014-07-03 — End: 2014-07-03

## 2014-07-03 MED ORDER — ASPIRIN 325 MG PO TABS
325.0000 mg | ORAL_TABLET | Freq: Every day | ORAL | Status: DC
  Administered 2014-07-03 – 2014-07-04 (×2): 325 mg via ORAL
  Filled 2014-07-03 (×2): qty 1

## 2014-07-03 MED ORDER — ALPRAZOLAM 0.5 MG PO TABS
1.0000 mg | ORAL_TABLET | Freq: Two times a day (BID) | ORAL | Status: DC
  Administered 2014-07-03 – 2014-07-05 (×4): 1 mg via ORAL
  Filled 2014-07-03 (×4): qty 2

## 2014-07-03 MED ORDER — NORTRIPTYLINE HCL 25 MG PO CAPS
25.0000 mg | ORAL_CAPSULE | Freq: Every day | ORAL | Status: DC
  Administered 2014-07-03 – 2014-07-04 (×2): 25 mg via ORAL
  Filled 2014-07-03 (×3): qty 1

## 2014-07-03 MED ORDER — TRIAZOLAM 0.125 MG PO TABS
0.1250 mg | ORAL_TABLET | Freq: Every evening | ORAL | Status: DC | PRN
  Administered 2014-07-04 (×2): 0.125 mg via ORAL
  Filled 2014-07-03 (×3): qty 1

## 2014-07-03 MED ORDER — DEXTROSE 5 % IV SOLN
1.0000 g | Freq: Once | INTRAVENOUS | Status: AC
  Administered 2014-07-03: 1 g via INTRAVENOUS
  Filled 2014-07-03: qty 10

## 2014-07-03 MED ORDER — PANTOPRAZOLE SODIUM 40 MG PO TBEC
40.0000 mg | DELAYED_RELEASE_TABLET | Freq: Every day | ORAL | Status: DC
  Administered 2014-07-03 – 2014-07-05 (×3): 40 mg via ORAL
  Filled 2014-07-03 (×3): qty 1

## 2014-07-03 NOTE — Telephone Encounter (Signed)
Patient called back to let me know that [name] is coming to get her.

## 2014-07-03 NOTE — ED Notes (Signed)
Pt currently in MRI, will obtain vital signs when pt returns, family member given crackers.

## 2014-07-03 NOTE — Telephone Encounter (Signed)
Pt called want

## 2014-07-03 NOTE — Telephone Encounter (Signed)
Patient called back to let me know that Sarah [name] is coming to get her.

## 2014-07-03 NOTE — ED Provider Notes (Signed)
CSN: [id number]     Arrival date & time 07/03/14  1800 History   First MD Initiated Contact with Patient 07/03/14 1812     Chief Complaint  Patient presents with  . Headache  . Aphasia     (Consider location/radiation/quality/duration/timing/severity/associated sxs/prior Treatment) Patient is a 81 y.o. female presenting with headaches. The history is provided by the patient.  Headache Pain location:  Generalized Quality: heavy, "like cement" Radiates to:  Does not radiate Onset quality:  Gradual Timing:  Constant Progression:  Unchanged Chronicity:  New Similar to prior headaches: no   Context: not eating   Relieved by:  Nothing Worsened by:  Nothing Associated symptoms: abdominal pain   Associated symptoms: no blurred vision, no cough, no dizziness, no eye pain, no facial pain, no fatigue, no fever, no myalgias, no nausea, no near-syncope, no neck pain, no neck stiffness and no vomiting   Associated symptoms comment:  Mild occasional aphasia, one or two words at a time. No forgetting of words or word finding   Past Medical History  Diagnosis Date  . Osteoarthritis   . Bladder incontinence   . Constipation     hx of  . Hypertension   . Hyperlipidemia   . Chest pain     S/P nuclear 07/17/2008  normal; EF 88% with no ischemia  . Chronic venous insufficiency     BLE edema, chronic, neg venous US 2/13  . History of chicken pox   . Depression   . Diverticulitis   . Polyp of colon   . Osteoporosis, postmenopausal 06/02/2011  . Esophageal stricture     s/p dilation 04/2012  . GERD (gastroesophageal reflux disease)    Past Surgical History  Procedure Laterality Date  . Appendectomy      age 31  . Tubal ligation    . Polypectomy    . Colonoscopy    . Upper gastrointestinal endoscopy     Family History  Problem Relation Age of Onset  . Cancer Son     testicular cancer  . Colon cancer Neg Hx   . Esophageal cancer Neg Hx   . Rectal cancer Neg Hx   . Stomach cancer Neg  Hx   . Breast cancer Sister   . Skin cancer Sister    History  Substance Use Topics  . Smoking status: Never Smoker   . Smokeless tobacco: Never Used  . Alcohol Use: No   OB History    No data available     Review of Systems  Constitutional: Negative for fever and fatigue.  Eyes: Negative for blurred vision and pain.  Respiratory: Negative for cough and shortness of breath.   Cardiovascular: Negative for near-syncope.  Gastrointestinal: Positive for abdominal pain. Negative for nausea and vomiting.  Musculoskeletal: Negative for myalgias, neck pain and neck stiffness.  Neurological: Positive for headaches. Negative for dizziness.  All other systems reviewed and are negative.     Allergies  Celebrex; Lipitor; Lovastatin; Other; and Latex  Home Medications   Prior to Admission medications   Medication Sig Start Date End Date Taking? Authorizing Provider  acetaminophen (TYLENOL) 500 MG tablet Take 1 tablet (500 mg total) by mouth every 8 (eight) hours as needed. 03/01/13  Yes Lyndal Pulley, DO  ALPRAZolam Duanne Moron) 1 MG tablet Take 1 tablet (1 mg total) by mouth 2 (two) times daily. 03/12/14  Yes Rowe Clack, MD  aspirin 81 MG tablet Take 81 mg by mouth daily.   Yes Historical Provider,  MD  Cholecalciferol (VITAMIN D PO) Take 1 tablet by mouth daily.    Yes Historical Provider, MD  clobetasol ointment (TEMOVATE) 4.33 % Apply 1 application topically 2 (two) times daily.  02/03/12  Yes Historical Provider, MD  cyanocobalamin (,VITAMIN B-12,) 1000 MCG/ML injection INJECT 1 ML INTO THE MUSCLE EVERY 30 DAYS. 01/11/14  Yes Rowe Clack, MD  diclofenac sodium (VOLTAREN) 1 % GEL Apply  To feet 3 times daily prn for pain 05/20/14  Yes Donika K Patel, DO  Glucosamine HCl 750 MG TABS Take 1 tablet by mouth 2 (two) times daily.    Yes Historical Provider, MD  ibandronate (BONIVA) 150 MG tablet TAKE ONE TABLET ONCE A MONTH IN THE MORNING ON AN EMPTY STOMACH WITH WATER. REMAIN  UPRIGHT. 05/24/14  Yes Rowe Clack, MD  miconazole (MONISTAT 7) 100 MG vaginal suppository Place 1 suppository (100 mg total) vaginally at bedtime. 06/18/13  Yes Rowe Clack, MD  Misc Natural Products [facility]) TABS Take 1 capsule by mouth 2 (two) times daily. 03/01/13  Yes Lyndal Pulley, DO  nitroGLYCERIN (NITROSTAT) 0.4 MG SL tablet Place 1 tablet (0.4 mg total) under the tongue every 5 (five) minutes as needed for chest pain. 07/20/13  Yes Darlin Coco, MD  nortriptyline (PAMELOR) 25 MG capsule TAKE 1 CAPSULE AT BEDTIME. 04/04/14  Yes Rowe Clack, MD  pantoprazole (PROTONIX) 40 MG tablet TAKE 1 TABLET ONCE DAILY. 03/06/14  Yes Irene Shipper, MD  perphenazine (TRILAFON) 2 MG tablet TAKE ONE TABLET AT BEDTIME. 01/08/14  Yes Rowe Clack, MD  triazolam (HALCION) 0.25 MG tablet Take 0.5-1 tablets (0.125-0.25 mg total) by mouth at bedtime as needed for sleep. 02/19/14  Yes Rowe Clack, MD  furosemide (LASIX) 20 MG tablet Take 1 tablet (20 mg total) by mouth as needed. 12/06/12   Rowe Clack, MD  solifenacin (VESICARE) 10 MG tablet Take 1 tablet (10 mg total) by mouth as needed. 12/06/12   Rowe Clack, MD   BP 186/74 mmHg  Pulse 99  Temp(Src) 98.2 F (36.8 C) (Oral)  Resp 18  Wt 155 lb (70.308 kg)  SpO2 98% Physical Exam  Constitutional: She is oriented to person, place, and time. She appears well-developed and well-nourished. No distress.  HENT:  Head: Normocephalic and atraumatic.  Mouth/Throat: Oropharynx is clear and moist.  Eyes: EOM are normal. Pupils are equal, round, and reactive to light.  Neck: Normal range of motion. Neck supple.  Cardiovascular: Normal rate and regular rhythm.  Exam reveals no friction rub.   No murmur heard. Pulmonary/Chest: Effort normal and breath sounds normal. No respiratory distress. She has no wheezes. She has no rales.  Abdominal: Soft. She exhibits no distension. There is no tenderness. There is no rebound.   Musculoskeletal: Normal range of motion. She exhibits no edema.  Neurological: She is alert and oriented to person, place, and time. No cranial nerve deficit. She exhibits normal muscle tone. Coordination normal.  Skin: No rash noted. She is not diaphoretic.  Nursing note and vitals reviewed.   ED Course  Procedures (including critical care time) Labs Review Labs Reviewed  CBC  BASIC METABOLIC PANEL  URINALYSIS, ROUTINE W REFLEX MICROSCOPIC  TROPONIN I    Imaging Review Dg Chest 2 View  07/03/2014   CLINICAL DATA:  Headache.  Slurred speech.  EXAM: CHEST  2 VIEW  COMPARISON:  08/13/2008 ; 04/15/2006  FINDINGS: Mildly tortuous thoracic aorta. Greater than expected 1.4 cm nodular density at  the right lung base on the frontal projection, admittedly at the superimposition of 2 ribs, but an intrapulmonary nodule cannot be excluded. This is above the expected level for a nipple shadow based on the distribution of thoracic tissues.  Cardiac and mediastinal margins appear normal. No pleural effusion. Spondylosis and degenerative disc disease in the vicinity of the lower thorax and upper lumbar spine.  IMPRESSION: 1. Indistinct nodular density at the right lung base. Although potentially related to overlying bony structures, I cannot exclude a true pulmonary nodule and accordingly chest CT is recommended for further characterization, to rule out malignancy.   Electronically Signed   By: Van Clines M.D.   On: 07/03/2014 19:31   Ct Head Wo Contrast  07/03/2014   CLINICAL DATA:  Headache and slurred speech after medication.  EXAM: CT HEAD WITHOUT CONTRAST  TECHNIQUE: Contiguous axial images were obtained from the base of the skull through the vertex without intravenous contrast.  COMPARISON:  10/15/2013  FINDINGS: Skull and Sinuses:Negative for fracture or destructive process. The mastoids, middle ears, and imaged paranasal sinuses are clear. Surgical clips anterior to the bilateral auricles.   Orbits: No acute abnormality.  Brain: No evidence of acute infarction, hemorrhage, hydrocephalus, or mass lesion/mass effect. Generalized brain atrophy which is mild for age. There is diffuse chronic small vessel disease with ischemic gliosis throughout the bilateral cerebral white matter.  IMPRESSION: 1. No acute intracranial findings. 2. Chronic small vessel disease without progression from 2015.   Electronically Signed   By: Monte Fantasia M.D.   On: 07/03/2014 19:41   Mr Jodene Nam Head Wo Contrast  07/03/2014   CLINICAL DATA:  Headache and slurred speech for several days. Aphasia.  EXAM: MRI HEAD WITHOUT CONTRAST  MRA HEAD WITHOUT CONTRAST  TECHNIQUE: Multiplanar, multiecho pulse sequences of the brain and surrounding structures were obtained without intravenous contrast. Angiographic images of the head were obtained using MRA technique without contrast.  COMPARISON:  CT head without contrast 07/03/2014. MRI brain 04/15/2006  FINDINGS: MRI HEAD FINDINGS  A 21 x 5 mm white matter infarct is present in the posterior right frontal lobe. No other areas of restricted diffusion are evident. T2 changes are associated with the acute infarct. Moderate generalized atrophy and diffuse white matter changes are evident bilaterally. There are remote lacunar infarcts within the basal ganglia bilaterally. White matter changes extend into the brainstem.  Flow is present in the major intracranial arteries. The globes and orbits are intact. The ventricles are proportionate to the degree of atrophy. No significant extraaxial fluid collection is present. Minimal mucosal thickening is present posteriorly in the right maxillary sinus. The remaining paranasal sinuses and the mastoid air cells are clear.  Midline structures are within normal limits. The skullbase is unremarkable.  MRA HEAD FINDINGS  The study is mildly degraded by patient motion.  The internal carotid arteries are within normal limits from the high cervical segments  through the ICA termini bilaterally. The A1 segments are normal. Mild irregularity is present in the distal left M1 segment, without a significant stenosis. There is a focal moderate to high-grade stenosis at the origin of the posterior-inferior right M2 segment. There is moderate attenuation of MCA branch vessels bilaterally. Branch vessels are more highly attenuated on the right.  The left vertebral artery is the dominant vessel. The left PICA origin is visualized. The vessel is small. A right AICA is noted. The basilar artery is within normal limits. The both posterior cerebral arteries originate from the basilar tip.  A high-grade stenosis is present within the proximal left P2 segment. There is mild distal segmental narrowing bilaterally, worse on the left.  IMPRESSION: 1. Acute nonhemorrhagic 21 mm white matter infarct of the posterior right frontal lobe. 2. Extensive atrophy and diffuse white matter disease corresponds to advanced chronic microvascular ischemia. 3. Moderate to high-grade stenosis of the infra posterior right M2 branch. 4. Moderate small vessel disease is evident on the MRA. 5. High-grade stenosis in the proximal left P2 segment.   Electronically Signed   By: San Morelle M.D.   On: 07/03/2014 21:04   Mr Brain Wo Contrast  07/03/2014   CLINICAL DATA:  Headache and slurred speech for several days. Aphasia.  EXAM: MRI HEAD WITHOUT CONTRAST  MRA HEAD WITHOUT CONTRAST  TECHNIQUE: Multiplanar, multiecho pulse sequences of the brain and surrounding structures were obtained without intravenous contrast. Angiographic images of the head were obtained using MRA technique without contrast.  COMPARISON:  CT head without contrast 07/03/2014. MRI brain 04/15/2006  FINDINGS: MRI HEAD FINDINGS  A 21 x 5 mm white matter infarct is present in the posterior right frontal lobe. No other areas of restricted diffusion are evident. T2 changes are associated with the acute infarct. Moderate generalized  atrophy and diffuse white matter changes are evident bilaterally. There are remote lacunar infarcts within the basal ganglia bilaterally. White matter changes extend into the brainstem.  Flow is present in the major intracranial arteries. The globes and orbits are intact. The ventricles are proportionate to the degree of atrophy. No significant extraaxial fluid collection is present. Minimal mucosal thickening is present posteriorly in the right maxillary sinus. The remaining paranasal sinuses and the mastoid air cells are clear.  Midline structures are within normal limits. The skullbase is unremarkable.  MRA HEAD FINDINGS  The study is mildly degraded by patient motion.  The internal carotid arteries are within normal limits from the high cervical segments through the ICA termini bilaterally. The A1 segments are normal. Mild irregularity is present in the distal left M1 segment, without a significant stenosis. There is a focal moderate to high-grade stenosis at the origin of the posterior-inferior right M2 segment. There is moderate attenuation of MCA branch vessels bilaterally. Branch vessels are more highly attenuated on the right.  The left vertebral artery is the dominant vessel. The left PICA origin is visualized. The vessel is small. A right AICA is noted. The basilar artery is within normal limits. The both posterior cerebral arteries originate from the basilar tip. A high-grade stenosis is present within the proximal left P2 segment. There is mild distal segmental narrowing bilaterally, worse on the left.  IMPRESSION: 1. Acute nonhemorrhagic 21 mm white matter infarct of the posterior right frontal lobe. 2. Extensive atrophy and diffuse white matter disease corresponds to advanced chronic microvascular ischemia. 3. Moderate to high-grade stenosis of the infra posterior right M2 branch. 4. Moderate small vessel disease is evident on the MRA. 5. High-grade stenosis in the proximal left P2 segment.    Electronically Signed   By: San Morelle M.D.   On: 07/03/2014 21:04     EKG Interpretation   Date/Time:  Wednesday July 03 2014 18:12:10 EDT Ventricular Rate:  102 PR Interval:  185 QRS Duration: 89 QT Interval:  368 QTC Calculation: 479 R Axis:   -4 Text Interpretation:  Sinus tachycardia Low voltage, precordial leads  Minimal ST depression, inferior leads Minimal ST elevation, lateral leads  Baseline wander in lead(s) I II III aVR aVF V4 V5  No presentation  Confirmed by Mingo Amber  MD, [place] ([id number]) on 07/03/2014 6:19:16 PM      MDM   Final diagnoses:  Headache  Aphasia  Acute ischemic stroke    81 year old female presents with headache. She reports it's been at least 1 month, but at other times he says his for many months. There is also concerned because she is having some mild aphasia. She has 1-2 words at a time will slur them. She never forgets words. Is not persistent aphasia. She has no vision changes. No trauma. Her and her caregiver here in the think he could be related to full tearing, which she's taken for leg pain. She also reports mild difficulty swallowing. 9 here vitals are stable. She has an intact neuro exam for me. We'll start head CT, labs. She does report some mild malaise and weakness, look for other possible sources of infection. Plan on MRI to rule out stroke.  MR shows acute stroke. Plan on admission over at [facility].  Evelina Bucy, MD 07/04/14 [phone]

## 2014-07-03 NOTE — H&P (Addendum)
[facility] History and Physical  Sarah [name] TMH:[id number] DOB: [birth year] DOA: 07/03/2014  Referring physician: EDP PCP: Gwendolyn Grant, MD   Chief Complaint: Headache, slurred speech   HPI: URI Sarah [name] is a 81 y.o. female who presents to the ED with c/o headache, and multi day history of slurred speech.  Symptoms onset a couple of days ago, and having mildly slurred speech since then that has been persistent.  She reports no difficulty with word finding.  Because this slurred speech was persisting she called her neurologist (who she sees for chronic peripheral neuropathy and multifactorial gait instability).  Her neurologist recommended she come in to the ED as she might be having a stroke.  In the ED there was some initial question of UTI and she was given a dose of rocephin.  Ultimately MRI does indeed confirm the presence of an acute ischemic infarct.  Review of Systems: Systems reviewed.  As above, otherwise negative  Past Medical History  Diagnosis Date  . Osteoarthritis   . Bladder incontinence   . Constipation     hx of  . Hypertension   . Hyperlipidemia   . Chest pain     S/P nuclear 07/17/2008  normal; EF 88% with no ischemia  . Chronic venous insufficiency     BLE edema, chronic, neg venous US 2/13  . History of chicken pox   . Depression   . Diverticulitis   . Polyp of colon   . Osteoporosis, postmenopausal 06/02/2011  . Esophageal stricture     s/p dilation 04/2012  . GERD (gastroesophageal reflux disease)    Past Surgical History  Procedure Laterality Date  . Appendectomy      age 49  . Tubal ligation    . Polypectomy    . Colonoscopy    . Upper gastrointestinal endoscopy     Social History:  reports that she has never smoked. She has never used smokeless tobacco. She reports that she does not drink alcohol or use illicit drugs.  Allergies  Allergen Reactions  . Celebrex [Celecoxib]     rash  . Lipitor [Atorvastatin Calcium]     Leg  weakness,memory loss  . Lovastatin     Leg weakness  . Other     Always pads  . Latex Rash    Family History  Problem Relation Age of Onset  . Cancer Son     testicular cancer  . Colon cancer Neg Hx   . Esophageal cancer Neg Hx   . Rectal cancer Neg Hx   . Stomach cancer Neg Hx   . Breast cancer Sister   . Skin cancer Sister      Prior to Admission medications   Medication Sig Start Date End Date Taking? Authorizing Provider  acetaminophen (TYLENOL) 500 MG tablet Take 1 tablet (500 mg total) by mouth every 8 (eight) hours as needed. 03/01/13  Yes Lyndal Pulley, DO  ALPRAZolam Duanne Moron) 1 MG tablet Take 1 tablet (1 mg total) by mouth 2 (two) times daily. 03/12/14  Yes Rowe Clack, MD  aspirin 81 MG tablet Take 81 mg by mouth daily.   Yes Historical Provider, MD  Cholecalciferol (VITAMIN D PO) Take 1 tablet by mouth daily.    Yes Historical Provider, MD  clobetasol ointment (TEMOVATE) 9.21 % Apply 1 application topically 2 (two) times daily.  02/03/12  Yes Historical Provider, MD  cyanocobalamin (,VITAMIN B-12,) 1000 MCG/ML injection INJECT 1 ML INTO THE MUSCLE EVERY 30 DAYS. 01/11/14  Yes Rowe Clack, MD  diclofenac sodium (VOLTAREN) 1 % GEL Apply  To feet 3 times daily prn for pain 05/20/14  Yes Donika K Patel, DO  Glucosamine HCl 750 MG TABS Take 1 tablet by mouth 2 (two) times daily.    Yes Historical Provider, MD  ibandronate (BONIVA) 150 MG tablet TAKE ONE TABLET ONCE A MONTH IN THE MORNING ON AN EMPTY STOMACH WITH WATER. REMAIN UPRIGHT. 05/24/14  Yes Rowe Clack, MD  Misc Natural Products [facility]) TABS Take 1 capsule by mouth 2 (two) times daily. 03/01/13  Yes Lyndal Pulley, DO  nitroGLYCERIN (NITROSTAT) 0.4 MG SL tablet Place 1 tablet (0.4 mg total) under the tongue every 5 (five) minutes as needed for chest pain. 07/20/13  Yes Darlin Coco, MD  nortriptyline (PAMELOR) 25 MG capsule TAKE 1 CAPSULE AT BEDTIME. 04/04/14  Yes Rowe Clack, MD   pantoprazole (PROTONIX) 40 MG tablet TAKE 1 TABLET ONCE DAILY. 03/06/14  Yes Irene Shipper, MD  perphenazine (TRILAFON) 2 MG tablet TAKE ONE TABLET AT BEDTIME. 01/08/14  Yes Rowe Clack, MD  triazolam (HALCION) 0.25 MG tablet Take 0.5-1 tablets (0.125-0.25 mg total) by mouth at bedtime as needed for sleep. 02/19/14  Yes Rowe Clack, MD  furosemide (LASIX) 20 MG tablet Take 1 tablet (20 mg total) by mouth as needed. 12/06/12   Rowe Clack, MD  solifenacin (VESICARE) 10 MG tablet Take 1 tablet (10 mg total) by mouth as needed. 12/06/12   Rowe Clack, MD   Physical Exam: Filed Vitals:   07/03/14 2051  BP: 171/73  Pulse: 90  Temp: 97.9 F (36.6 C)  Resp: 18    BP 171/73 mmHg  Pulse 90  Temp(Src) 97.9 F (36.6 C) (Oral)  Resp 18  Wt 70.308 kg (155 lb)  SpO2 96%  General Appearance:    Alert, oriented, no distress, appears stated age  Head:    Normocephalic, atraumatic  Eyes:    PERRL, EOMI, sclera non-icteric        Nose:   Nares without drainage or epistaxis. Mucosa, turbinates normal  Throat:   Moist mucous membranes. Oropharynx without erythema or exudate.  Neck:   Supple. No carotid bruits.  No thyromegaly.  No lymphadenopathy.   Back:     No CVA tenderness, no spinal tenderness  Lungs:     Clear to auscultation bilaterally, without wheezes, rhonchi or rales  Chest wall:    No tenderness to palpitation  Heart:    Regular rate and rhythm without murmurs, gallops, rubs  Abdomen:     Soft, non-tender, nondistended, normal bowel sounds, no organomegaly  Genitalia:    deferred  Rectal:    deferred  Extremities:   No clubbing, cyanosis or edema.  Pulses:   2+ and symmetric all extremities  Skin:   Skin color, texture, turgor normal, no rashes or lesions  Lymph nodes:   Cervical, supraclavicular, and axillary nodes normal  Neurologic:   CNII-XII intact. Normal strength, sensation and reflexes      throughout    Labs on Admission:  Basic Metabolic  Panel:  Recent Labs Lab 07/03/14 1832  NA 130*  K 4.0  CL 93*  CO2 27  GLUCOSE 108*  BUN 14  CREATININE 0.71  CALCIUM 9.0   Liver Function Tests: No results for input(s): AST, ALT, ALKPHOS, BILITOT, PROT, ALBUMIN in the last 168 hours. No results for input(s): LIPASE, AMYLASE in the last 168 hours. No results for input(s):  AMMONIA in the last 168 hours. CBC:  Recent Labs Lab 07/03/14 1832  WBC 5.1  HGB 12.9  HCT 39.2  MCV 96.1  PLT 380   Cardiac Enzymes:  Recent Labs Lab 07/03/14 1837  TROPONINI <0.03    BNP (last 3 results) No results for input(s): PROBNP in the last 8760 hours. CBG: No results for input(s): GLUCAP in the last 168 hours.  Radiological Exams on Admission: Dg Chest 2 View  07/03/2014   CLINICAL DATA:  Headache.  Slurred speech.  EXAM: CHEST  2 VIEW  COMPARISON:  08/13/2008 ; 04/15/2006  FINDINGS: Mildly tortuous thoracic aorta. Greater than expected 1.4 cm nodular density at the right lung base on the frontal projection, admittedly at the superimposition of 2 ribs, but an intrapulmonary nodule cannot be excluded. This is above the expected level for a nipple shadow based on the distribution of thoracic tissues.  Cardiac and mediastinal margins appear normal. No pleural effusion. Spondylosis and degenerative disc disease in the vicinity of the lower thorax and upper lumbar spine.  IMPRESSION: 1. Indistinct nodular density at the right lung base. Although potentially related to overlying bony structures, I cannot exclude a true pulmonary nodule and accordingly chest CT is recommended for further characterization, to rule out malignancy.   Electronically Signed   By: Van Clines M.D.   On: 07/03/2014 19:31   Ct Head Wo Contrast  07/03/2014   CLINICAL DATA:  Headache and slurred speech after medication.  EXAM: CT HEAD WITHOUT CONTRAST  TECHNIQUE: Contiguous axial images were obtained from the base of the skull through the vertex without intravenous  contrast.  COMPARISON:  10/15/2013  FINDINGS: Skull and Sinuses:Negative for fracture or destructive process. The mastoids, middle ears, and imaged paranasal sinuses are clear. Surgical clips anterior to the bilateral auricles.  Orbits: No acute abnormality.  Brain: No evidence of acute infarction, hemorrhage, hydrocephalus, or mass lesion/mass effect. Generalized brain atrophy which is mild for age. There is diffuse chronic small vessel disease with ischemic gliosis throughout the bilateral cerebral white matter.  IMPRESSION: 1. No acute intracranial findings. 2. Chronic small vessel disease without progression from 2015.   Electronically Signed   By: Monte Fantasia M.D.   On: 07/03/2014 19:41   Mr Jodene Nam Head Wo Contrast  07/03/2014   CLINICAL DATA:  Headache and slurred speech for several days. Aphasia.  EXAM: MRI HEAD WITHOUT CONTRAST  MRA HEAD WITHOUT CONTRAST  TECHNIQUE: Multiplanar, multiecho pulse sequences of the brain and surrounding structures were obtained without intravenous contrast. Angiographic images of the head were obtained using MRA technique without contrast.  COMPARISON:  CT head without contrast 07/03/2014. MRI brain 04/15/2006  FINDINGS: MRI HEAD FINDINGS  A 21 x 5 mm white matter infarct is present in the posterior right frontal lobe. No other areas of restricted diffusion are evident. T2 changes are associated with the acute infarct. Moderate generalized atrophy and diffuse white matter changes are evident bilaterally. There are remote lacunar infarcts within the basal ganglia bilaterally. White matter changes extend into the brainstem.  Flow is present in the major intracranial arteries. The globes and orbits are intact. The ventricles are proportionate to the degree of atrophy. No significant extraaxial fluid collection is present. Minimal mucosal thickening is present posteriorly in the right maxillary sinus. The remaining paranasal sinuses and the mastoid air cells are clear.  Midline  structures are within normal limits. The skullbase is unremarkable.  MRA HEAD FINDINGS  The study is mildly degraded by patient  motion.  The internal carotid arteries are within normal limits from the high cervical segments through the ICA termini bilaterally. The A1 segments are normal. Mild irregularity is present in the distal left M1 segment, without a significant stenosis. There is a focal moderate to high-grade stenosis at the origin of the posterior-inferior right M2 segment. There is moderate attenuation of MCA branch vessels bilaterally. Branch vessels are more highly attenuated on the right.  The left vertebral artery is the dominant vessel. The left PICA origin is visualized. The vessel is small. A right AICA is noted. The basilar artery is within normal limits. The both posterior cerebral arteries originate from the basilar tip. A high-grade stenosis is present within the proximal left P2 segment. There is mild distal segmental narrowing bilaterally, worse on the left.  IMPRESSION: 1. Acute nonhemorrhagic 21 mm white matter infarct of the posterior right frontal lobe. 2. Extensive atrophy and diffuse white matter disease corresponds to advanced chronic microvascular ischemia. 3. Moderate to high-grade stenosis of the infra posterior right M2 branch. 4. Moderate small vessel disease is evident on the MRA. 5. High-grade stenosis in the proximal left P2 segment.   Electronically Signed   By: San Morelle M.D.   On: 07/03/2014 21:04   Mr Brain Wo Contrast  07/03/2014   CLINICAL DATA:  Headache and slurred speech for several days. Aphasia.  EXAM: MRI HEAD WITHOUT CONTRAST  MRA HEAD WITHOUT CONTRAST  TECHNIQUE: Multiplanar, multiecho pulse sequences of the brain and surrounding structures were obtained without intravenous contrast. Angiographic images of the head were obtained using MRA technique without contrast.  COMPARISON:  CT head without contrast 07/03/2014. MRI brain 04/15/2006  FINDINGS: MRI  HEAD FINDINGS  A 21 x 5 mm white matter infarct is present in the posterior right frontal lobe. No other areas of restricted diffusion are evident. T2 changes are associated with the acute infarct. Moderate generalized atrophy and diffuse white matter changes are evident bilaterally. There are remote lacunar infarcts within the basal ganglia bilaterally. White matter changes extend into the brainstem.  Flow is present in the major intracranial arteries. The globes and orbits are intact. The ventricles are proportionate to the degree of atrophy. No significant extraaxial fluid collection is present. Minimal mucosal thickening is present posteriorly in the right maxillary sinus. The remaining paranasal sinuses and the mastoid air cells are clear.  Midline structures are within normal limits. The skullbase is unremarkable.  MRA HEAD FINDINGS  The study is mildly degraded by patient motion.  The internal carotid arteries are within normal limits from the high cervical segments through the ICA termini bilaterally. The A1 segments are normal. Mild irregularity is present in the distal left M1 segment, without a significant stenosis. There is a focal moderate to high-grade stenosis at the origin of the posterior-inferior right M2 segment. There is moderate attenuation of MCA branch vessels bilaterally. Branch vessels are more highly attenuated on the right.  The left vertebral artery is the dominant vessel. The left PICA origin is visualized. The vessel is small. A right AICA is noted. The basilar artery is within normal limits. The both posterior cerebral arteries originate from the basilar tip. A high-grade stenosis is present within the proximal left P2 segment. There is mild distal segmental narrowing bilaterally, worse on the left.  IMPRESSION: 1. Acute nonhemorrhagic 21 mm white matter infarct of the posterior right frontal lobe. 2. Extensive atrophy and diffuse white matter disease corresponds to advanced chronic  microvascular ischemia. 3. Moderate to  high-grade stenosis of the infra posterior right M2 branch. 4. Moderate small vessel disease is evident on the MRA. 5. High-grade stenosis in the proximal left P2 segment.   Electronically Signed   By: San Morelle M.D.   On: 07/03/2014 21:04    EKG: Independently reviewed.   Assessment/Plan Principal Problem:   Acute ischemic right MCA stroke   1. Acute ischemic R MCA stroke - 1. Neurology consulted 2. Admit under stroke pathway 3. ASA increased to 325 daily for now pending neuro recs 4. Tele monitor 5. Passed swallow in ED, does have chronic trouble with a long standing esophageal stricture but no new suggestion of aspiration on swallowing today. 6. PT/OT/SLP 7. 2d echo 8. Carotid duplex 9. Risk factor modification 10. Suspicious that this stroke may be related to intracranial atherosclerosis (as demonstrated on MRA) 2. Headache due to stroke - Tylenol for headache 3. Elevated BP without diagnosis of HTN - allow permissive HTN in setting of acute ischemic stroke.    Code Status: Full  Family Communication: Caregiver Rita at bedside, spoke with daughter on phone Disposition Plan: [facility]   Time spent: 70 min  Genee Rann M. [facility] Pager [phone]  If 7AM-7PM, please contact the day team taking care of the patient [email or web address] Password [facility] 07/03/2014, 10:27 PM

## 2014-07-03 NOTE — Telephone Encounter (Signed)
Patient called stating that she is having slurred speech and headaches.  She is scared she may be having a stroke.  I instructed her to go to hospital but she said that she has no one to take her.  I then instructed her to call 911.  Patient said that she would try to call her daytime caregiver and see if she could take her.  I requested that she keep [name] posted.

## 2014-07-03 NOTE — ED Notes (Addendum)
Pt from home with caregiver c/o headache and slurred speech x few days. Pt's caregiver reports her taking voltaren for pain relief in legs and reports she has slurred speech afterward. She is alert and oriented. Pt is also reporting that it sometimes feels as if something is stuck in her throat.Per caregiver patient is ambulatory normally with a walker and has been walking but seems weaker per caregiver.  Pt also reports urinary frequency.

## 2014-07-04 DIAGNOSIS — I639 Cerebral infarction, unspecified: Secondary | ICD-10-CM

## 2014-07-04 DIAGNOSIS — I6789 Other cerebrovascular disease: Secondary | ICD-10-CM

## 2014-07-04 LAB — TROPONIN I

## 2014-07-04 LAB — SODIUM, URINE, RANDOM: SODIUM UR: 45 mmol/L

## 2014-07-04 LAB — URINALYSIS, ROUTINE W REFLEX MICROSCOPIC
Bilirubin Urine: NEGATIVE
Glucose, UA: NEGATIVE mg/dL
Hgb urine dipstick: NEGATIVE
Ketones, ur: NEGATIVE mg/dL
LEUKOCYTES UA: NEGATIVE
NITRITE: NEGATIVE
PH: 7.5 (ref 5.0–8.0)
Protein, ur: NEGATIVE mg/dL
SPECIFIC GRAVITY, URINE: 1.009 (ref 1.005–1.030)
UROBILINOGEN UA: 0.2 mg/dL (ref 0.0–1.0)

## 2014-07-04 LAB — OSMOLALITY, URINE: Osmolality, Ur: 248 mOsm/kg — ABNORMAL LOW (ref 390–1090)

## 2014-07-04 LAB — OSMOLALITY: Osmolality: 267 mOsm/kg — ABNORMAL LOW (ref 275–300)

## 2014-07-04 LAB — CREATININE, URINE, RANDOM: Creatinine, Urine: 37.98 mg/dL

## 2014-07-04 MED ORDER — CLOPIDOGREL BISULFATE 75 MG PO TABS
75.0000 mg | ORAL_TABLET | Freq: Every day | ORAL | Status: DC
  Administered 2014-07-05: 75 mg via ORAL
  Filled 2014-07-04: qty 1

## 2014-07-04 MED ORDER — IBUPROFEN 200 MG PO TABS
400.0000 mg | ORAL_TABLET | Freq: Once | ORAL | Status: AC
  Administered 2014-07-04: 400 mg via ORAL
  Filled 2014-07-04: qty 2

## 2014-07-04 NOTE — Progress Notes (Signed)
Pt admitted from [facility] ED with symptoms of stroke,pt alert and oriented but c/o of headache, pt settled in bed, call light at bedside, will however continue to monitor. [place]As[place], Julea Hutto Efe

## 2014-07-04 NOTE — Progress Notes (Signed)
  Echocardiogram 2D Echocardiogram has been performed.  Sarah [name] FRANCES 07/04/2014, 3:52 PM

## 2014-07-04 NOTE — Progress Notes (Signed)
STROKE TEAM PROGRESS NOTE   HISTORY Sarah [name] is an 81 y.o. female with history of HTN and HLD presenting with 2-3 day history of slurred speech. Denies any word finding difficulty. Denies any weakness or sensory symptoms. + generalized pressure type headache. Called her outpatient neurologist who directed her to the ED. Had MRI brain in the ED (imaging reviewed) which shows acute infarct in posterior right frontal lobe. MRA notable for high grade stenosis of infra posterior right M2 and high grade stenosis of proximal left P2. No prior CVA or TIA. Takes ASA 81mg  daily at home. Patient was not administered TPA secondary to delay in arrival. She was admitted for further evaluation and treatment.   SUBJECTIVE (INTERVAL HISTORY) No family is at the bedside.  Overall she feels her condition is stable. She has multifactorial gait difficulty with mild neuropathy and sees Dr Narda Amber   OBJECTIVE Temp:  [97.6 F (36.4 C)-98.6 F (37 C)] 98.6 F (37 C) (04/07 1000) Pulse Rate:  [73-99] 76 (04/07 1000) Cardiac Rhythm:  [-] Normal sinus rhythm (04/07 1000) Resp:  [15-22] 15 (04/07 1000) BP: (110-186)/(45-76) 115/52 mmHg (04/07 1000) SpO2:  [95 %-100 %] 100 % (04/07 1000) Weight:  [70.308 kg (155 lb)-72.53 kg (159 lb 14.4 oz)] 72.53 kg (159 lb 14.4 oz) (04/06 2357)  No results for input(s): GLUCAP in the last 168 hours.  Recent Labs Lab 07/03/14 1832  NA 130*  K 4.0  CL 93*  CO2 27  GLUCOSE 108*  BUN 14  CREATININE 0.71  CALCIUM 9.0   No results for input(s): AST, ALT, ALKPHOS, BILITOT, PROT, ALBUMIN in the last 168 hours.  Recent Labs Lab 07/03/14 1832  WBC 5.1  HGB 12.9  HCT 39.2  MCV 96.1  PLT 380    Recent Labs Lab 07/03/14 1837 07/04/14 0842  TROPONINI <0.03 <0.03   No results for input(s): LABPROT, INR in the last 72 hours.  Recent Labs  07/03/14 1911  COLORURINE YELLOW  LABSPEC 1.009  PHURINE 7.0  GLUCOSEU NEGATIVE  HGBUR NEGATIVE  BILIRUBINUR  NEGATIVE  KETONESUR NEGATIVE  PROTEINUR NEGATIVE  UROBILINOGEN 0.2  NITRITE NEGATIVE  LEUKOCYTESUR MODERATE*       Component Value Date/Time   CHOL 193 06/28/2012 1146   TRIG 60.0 06/28/2012 1146   HDL 92.20 06/28/2012 1146   CHOLHDL 2 06/28/2012 1146   VLDL 12.0 06/28/2012 1146   LDLCALC 89 06/28/2012 1146   Lab Results  Component Value Date   HGBA1C 5.9 01/16/2013   No results found for: LABOPIA, COCAINSCRNUR, LABBENZ, AMPHETMU, THCU, LABBARB  No results for input(s): ETH in the last 168 hours.  Dg Chest 2 View  07/03/2014   CLINICAL DATA:  Headache.  Slurred speech.  EXAM: CHEST  2 VIEW  COMPARISON:  08/13/2008 ; 04/15/2006  FINDINGS: Mildly tortuous thoracic aorta. Greater than expected 1.4 cm nodular density at the right lung base on the frontal projection, admittedly at the superimposition of 2 ribs, but an intrapulmonary nodule cannot be excluded. This is above the expected level for a nipple shadow based on the distribution of thoracic tissues.  Cardiac and mediastinal margins appear normal. No pleural effusion. Spondylosis and degenerative disc disease in the vicinity of the lower thorax and upper lumbar spine.  IMPRESSION: 1. Indistinct nodular density at the right lung base. Although potentially related to overlying bony structures, I cannot exclude a true pulmonary nodule and accordingly chest CT is recommended for further characterization, to rule out malignancy.  Electronically Signed   By: Van Clines M.D.   On: 07/03/2014 19:31   Ct Head Wo Contrast  07/03/2014   CLINICAL DATA:  Headache and slurred speech after medication.  EXAM: CT HEAD WITHOUT CONTRAST  TECHNIQUE: Contiguous axial images were obtained from the base of the skull through the vertex without intravenous contrast.  COMPARISON:  10/15/2013  FINDINGS: Skull and Sinuses:Negative for fracture or destructive process. The mastoids, middle ears, and imaged paranasal sinuses are clear. Surgical clips anterior  to the bilateral auricles.  Orbits: No acute abnormality.  Brain: No evidence of acute infarction, hemorrhage, hydrocephalus, or mass lesion/mass effect. Generalized brain atrophy which is mild for age. There is diffuse chronic small vessel disease with ischemic gliosis throughout the bilateral cerebral white matter.  IMPRESSION: 1. No acute intracranial findings. 2. Chronic small vessel disease without progression from 2015.   Electronically Signed   By: Monte Fantasia M.D.   On: 07/03/2014 19:41   Mr Jodene Nam Head Wo Contrast  07/03/2014   CLINICAL DATA:  Headache and slurred speech for several days. Aphasia.  EXAM: MRI HEAD WITHOUT CONTRAST  MRA HEAD WITHOUT CONTRAST  TECHNIQUE: Multiplanar, multiecho pulse sequences of the brain and surrounding structures were obtained without intravenous contrast. Angiographic images of the head were obtained using MRA technique without contrast.  COMPARISON:  CT head without contrast 07/03/2014. MRI brain 04/15/2006  FINDINGS: MRI HEAD FINDINGS  A 21 x 5 mm white matter infarct is present in the posterior right frontal lobe. No other areas of restricted diffusion are evident. T2 changes are associated with the acute infarct. Moderate generalized atrophy and diffuse white matter changes are evident bilaterally. There are remote lacunar infarcts within the basal ganglia bilaterally. White matter changes extend into the brainstem.  Flow is present in the major intracranial arteries. The globes and orbits are intact. The ventricles are proportionate to the degree of atrophy. No significant extraaxial fluid collection is present. Minimal mucosal thickening is present posteriorly in the right maxillary sinus. The remaining paranasal sinuses and the mastoid air cells are clear.  Midline structures are within normal limits. The skullbase is unremarkable.  MRA HEAD FINDINGS  The study is mildly degraded by patient motion.  The internal carotid arteries are within normal limits from the  high cervical segments through the ICA termini bilaterally. The A1 segments are normal. Mild irregularity is present in the distal left M1 segment, without a significant stenosis. There is a focal moderate to high-grade stenosis at the origin of the posterior-inferior right M2 segment. There is moderate attenuation of MCA branch vessels bilaterally. Branch vessels are more highly attenuated on the right.  The left vertebral artery is the dominant vessel. The left PICA origin is visualized. The vessel is small. A right AICA is noted. The basilar artery is within normal limits. The both posterior cerebral arteries originate from the basilar tip. A high-grade stenosis is present within the proximal left P2 segment. There is mild distal segmental narrowing bilaterally, worse on the left.  IMPRESSION: 1. Acute nonhemorrhagic 21 mm white matter infarct of the posterior right frontal lobe. 2. Extensive atrophy and diffuse white matter disease corresponds to advanced chronic microvascular ischemia. 3. Moderate to high-grade stenosis of the infra posterior right M2 branch. 4. Moderate small vessel disease is evident on the MRA. 5. High-grade stenosis in the proximal left P2 segment.   Electronically Signed   By: San Morelle M.D.   On: 07/03/2014 21:04   Mr Brain Wo Contrast  07/03/2014   CLINICAL DATA:  Headache and slurred speech for several days. Aphasia.  EXAM: MRI HEAD WITHOUT CONTRAST  MRA HEAD WITHOUT CONTRAST  TECHNIQUE: Multiplanar, multiecho pulse sequences of the brain and surrounding structures were obtained without intravenous contrast. Angiographic images of the head were obtained using MRA technique without contrast.  COMPARISON:  CT head without contrast 07/03/2014. MRI brain 04/15/2006  FINDINGS: MRI HEAD FINDINGS  A 21 x 5 mm white matter infarct is present in the posterior right frontal lobe. No other areas of restricted diffusion are evident. T2 changes are associated with the acute infarct.  Moderate generalized atrophy and diffuse white matter changes are evident bilaterally. There are remote lacunar infarcts within the basal ganglia bilaterally. White matter changes extend into the brainstem.  Flow is present in the major intracranial arteries. The globes and orbits are intact. The ventricles are proportionate to the degree of atrophy. No significant extraaxial fluid collection is present. Minimal mucosal thickening is present posteriorly in the right maxillary sinus. The remaining paranasal sinuses and the mastoid air cells are clear.  Midline structures are within normal limits. The skullbase is unremarkable.  MRA HEAD FINDINGS  The study is mildly degraded by patient motion.  The internal carotid arteries are within normal limits from the high cervical segments through the ICA termini bilaterally. The A1 segments are normal. Mild irregularity is present in the distal left M1 segment, without a significant stenosis. There is a focal moderate to high-grade stenosis at the origin of the posterior-inferior right M2 segment. There is moderate attenuation of MCA branch vessels bilaterally. Branch vessels are more highly attenuated on the right.  The left vertebral artery is the dominant vessel. The left PICA origin is visualized. The vessel is small. A right AICA is noted. The basilar artery is within normal limits. The both posterior cerebral arteries originate from the basilar tip. A high-grade stenosis is present within the proximal left P2 segment. There is mild distal segmental narrowing bilaterally, worse on the left.  IMPRESSION: 1. Acute nonhemorrhagic 21 mm white matter infarct of the posterior right frontal lobe. 2. Extensive atrophy and diffuse white matter disease corresponds to advanced chronic microvascular ischemia. 3. Moderate to high-grade stenosis of the infra posterior right M2 branch. 4. Moderate small vessel disease is evident on the MRA. 5. High-grade stenosis in the proximal left  P2 segment.   Electronically Signed   By: San Morelle M.D.   On: 07/03/2014 21:04   Carotid Doppler  There is 1-39% bilateral ICA stenosis. Vertebral artery flow is antegrade.     PHYSICAL EXAM Pleasant elderly lady not in distress. . Afebrile. Head is nontraumatic. Neck is supple without bruit.    Cardiac exam no murmur or gallop. Lungs are clear to auscultation. Distal pulses are well felt. Neurological Exam :  ;  Awake  Alert oriented x 3. Normal speech and language.eye movements full without nystagmus.fundi were not visualized. Vision acuity and fields appear normal. Hearing is normal. Palatal movements are normal. Face symmetric. Tongue midline. Normal strength, tone, reflexes except depressed ankle jerks and normal coordination. Normal sensation but subjective pain in feet. Diminished vivration from ankles down bilaterally. Gait deferred. ASSESSMENT/PLAN Sarah [name] is a 81 y.o. female with history of hypertension and hyperlipidemia presenting with 2-3 days of slurred speech. She did not receive IV t-PA due to delay in arrival.   Stroke:  right posterior frontal lobe infarct in setting of severe R M2 stenosis, felt to be secondary  to atherosclerosis and small vessel disease  Resultant  Slurred speech improving  MRI  R frontal lobe infarct   MRA  Severe R M2 and L P2 stenosis  Carotid Doppler  No significant stenosis   2D Echo  pending   HgbA1c not drawn. Will order  Heparin 5000 units sq tid for VTE prophylaxis DIET DYS 3 Room service appropriate?: Yes; Fluid consistency:: Thin  aspirin 81 mg orally every day prior to admission, now on aspirin 325 mg orally every day. Recommend change to plavix 75 mg daily. Order written.  Patient counseled to be compliant with her antithrombotic medications  Ongoing aggressive stroke risk factor management  Therapy recommendations:  HH OT and OP ST  Disposition:  Home with outpatient therapies if she has  transportation  Sees Dr. Posey Pronto  neurologist as an OP for neuropathy. Recommend followup with Dr. Posey Pronto  Hypertension  Stable  Hyperlipidemia  Home meds:  No statin  LDL not ordered, will order, goal < 70  Based on prior results, may need statin  Other Stroke Risk Factors  Advanced age  Other Active Problems  Anxiety on xanax  DJD  hyponatremia  Hospital day # [place] for Pager information 07/04/2014 5:20 PM  I have personally examined this patient, reviewed notes, independently viewed imaging studies, participated in medical decision making and plan of care. I have made any additions or clarifications directly to the above note. Agree with note above. She presented with dysarthria due to right frontal white matter infarct likely due to combination of small vessel disease and intracranial atherosclerosis. She is at high risk for recurrent stroke/TIa and needs ongoing stroke evaluation and aggressive risk factor modification.Continue aspirin.  Antony Contras, MD Medical Director [facility] Pager: [phone] 07/04/2014 10:06 PM    To contact Stroke Continuity provider, please refer to [email or web address] After hours, contact General Neurology

## 2014-07-04 NOTE — Evaluation (Signed)
Clinical/Bedside Swallow Evaluation Patient Details  Name: Sarah [name] MRN: [id number] Date of Birth: 08/19/1926  Today's Date: 07/04/2014 Time: SLP Start Time (ACUTE ONLY): 0957 SLP Stop Time (ACUTE ONLY): 1015 SLP Time Calculation (min) (ACUTE ONLY): 18 min  Past Medical History:  Past Medical History  Diagnosis Date  . Osteoarthritis   . Bladder incontinence   . Constipation     hx of  . Hypertension   . Hyperlipidemia   . Chest pain     S/P nuclear 07/17/2008  normal; EF 88% with no ischemia  . Chronic venous insufficiency     BLE edema, chronic, neg venous US 2/13  . History of chicken pox   . Depression   . Diverticulitis   . Polyp of colon   . Osteoporosis, postmenopausal 06/02/2011  . Esophageal stricture     s/p dilation 04/2012  . GERD (gastroesophageal reflux disease)    Past Surgical History:  Past Surgical History  Procedure Laterality Date  . Appendectomy      age 41  . Tubal ligation    . Polypectomy    . Colonoscopy    . Upper gastrointestinal endoscopy     HPI:  81 y.o. female admitted with headache, and multi day history of slurred speech.MRI acute nonhemorrhagic 21 mm white matter infarct of the posterior right frontal lobe. PMH: HTN, GERD, esophageal dilation 13 times, most recent was 6 yrs ago. CXR no acute intracranial findings. No prior ST services found in documentation.   Assessment / Plan / Recommendation Clinical Impression  Pt exhibited overall functional oral and pharyngeal swallow with immediate cough following initial cup sip of thin liquids possibly due to initial difficulty controling bolus. Subsequent cup sips thin liquid were consistently safe without s/s aspiration. Mastication with solid WFL's. She reports a history of globus sensation (esophageal) but denied during assessment. Pt has history of GERD and SLP provided verbal education for pt and caregiver re: reflux strategies. Advised pt to follow up with GI MD after discharge to further  investigate esophagus and need for intervention. Recommend regualr texture, thin liquids, small sips especially if using straw, pills with small sips water, alternate liquids and solids. Will follow up x 1 for dysphagia.     Aspiration Risk   (mild-mod)    Diet Recommendation Regular;Thin liquid   Liquid Administration via: Cup;Straw Medication Administration: Whole meds with liquid Supervision: Patient able to self feed;Intermittent supervision to cue for compensatory strategies Compensations: Slow rate;Small sips/bites;Follow solids with liquid Postural Changes and/or Swallow Maneuvers: Seated upright 90 degrees;Upright 30-60 min after meal    Other  Recommendations Recommended Consults: Consider GI evaluation Oral Care Recommendations: Oral care BID   Follow Up Recommendations  None (none for swallowing)    Frequency and Duration        Pertinent Vitals/Pain none      Swallow Study Prior Functional Status  Type of Home: Apartment Available Help at Discharge: Personal care attendant (10AM-2PM)    General HPI: 81 y.o. female admitted with headache, and multi day history of slurred speech.MRI acute nonhemorrhagic 21 mm white matter infarct of the posterior right frontal lobe. PMH: HTN, GERD, esophageal dilation 13 times, most recent was 6 yrs ago. CXR no acute intracranial findings. No prior ST services found in documentation. Type of Study: Bedside swallow evaluation Previous Swallow Assessment:  (no MBS or BSE) Diet Prior to this Study: Regular;Thin liquids Temperature Spikes Noted: No Respiratory Status: Room air History of Recent Intubation: No Behavior/Cognition: Alert;Cooperative;Pleasant  mood Oral Cavity - Dentition: Adequate natural dentition Self-Feeding Abilities: Able to feed self Patient Positioning: Upright in bed Baseline Vocal Quality: Clear Volitional Cough: Weak Volitional Swallow: Able to elicit    Oral/Motor/Sensory Function Overall Oral Motor/Sensory  Function: Appears within functional limits for tasks assessed   Ice Chips Ice chips: Not tested   Thin Liquid Thin Liquid: Impaired Presentation: Cup;Straw Pharyngeal  Phase Impairments: Cough - Immediate    Nectar Thick Nectar Thick Liquid: Within functional limits   Honey Thick Honey Thick Liquid: Within functional limits   Puree Puree: Within functional limits   Solid   GO    Solid: Within functional limits       Sarah [name] 07/04/2014,1:17 PM  Sarah [name] [place].Ed [employer] [phone]

## 2014-07-04 NOTE — Consult Note (Signed)
Stroke Consult    Chief Complaint: slurred speech  HPI: Sarah [name] is an 81 y.o. female with history of HTN and HLD presenting with 2-3 day history of slurred speech. Denies any word finding difficulty. Denies any weakness or sensory symptoms. + generalized pressure type headache. Called her outpatient neurologist who directed her to the ED. Had MRI brain in the ED (imaging reviewed) which shows acute infarct in posterior right frontal lobe. MRA notable for high grade stenosis of infra posterior right M2 and high grade stenosis of proximal left P2.   No prior CVA or TIA. Takes ASA 81mg  daily at home.    Past Medical History  Diagnosis Date  . Osteoarthritis   . Bladder incontinence   . Constipation     hx of  . Hypertension   . Hyperlipidemia   . Chest pain     S/P nuclear 07/17/2008  normal; EF 88% with no ischemia  . Chronic venous insufficiency     BLE edema, chronic, neg venous US 2/13  . History of chicken pox   . Depression   . Diverticulitis   . Polyp of colon   . Osteoporosis, postmenopausal 06/02/2011  . Esophageal stricture     s/p dilation 04/2012  . GERD (gastroesophageal reflux disease)     Past Surgical History  Procedure Laterality Date  . Appendectomy      age 81  . Tubal ligation    . Polypectomy    . Colonoscopy    . Upper gastrointestinal endoscopy      Family History  Problem Relation Age of Onset  . Cancer Son     testicular cancer  . Colon cancer Neg Hx   . Esophageal cancer Neg Hx   . Rectal cancer Neg Hx   . Stomach cancer Neg Hx   . Breast cancer Sister   . Skin cancer Sister    Social History:  reports that she has never smoked. She has never used smokeless tobacco. She reports that she does not drink alcohol or use illicit drugs.  Allergies:  Allergies  Allergen Reactions  . Celebrex [Celecoxib]     rash  . Lipitor [Atorvastatin Calcium]     Leg weakness,memory loss  . Lovastatin     Leg weakness  . Other     Always pads  .  Latex Rash    Medications Prior to Admission  Medication Sig Dispense Refill  . acetaminophen (TYLENOL) 500 MG tablet Take 1 tablet (500 mg total) by mouth every 8 (eight) hours as needed. 180 tablet 2  . ALPRAZolam (XANAX) 1 MG tablet Take 1 tablet (1 mg total) by mouth 2 (two) times daily. 60 tablet 5  . aspirin 81 MG tablet Take 81 mg by mouth daily.    . Cholecalciferol (VITAMIN D PO) Take 1 tablet by mouth daily.     . clobetasol ointment (TEMOVATE) 5.46 % Apply 1 application topically 2 (two) times daily.     . cyanocobalamin (,VITAMIN B-12,) 1000 MCG/ML injection INJECT 1 ML INTO THE MUSCLE EVERY 30 DAYS. 10 mL 5  . diclofenac sodium (VOLTAREN) 1 % GEL Apply  To feet 3 times daily prn for pain 1 Tube 5  . Glucosamine HCl 750 MG TABS Take 1 tablet by mouth 2 (two) times daily.     Marland Kitchen ibandronate (BONIVA) 150 MG tablet TAKE ONE TABLET ONCE A MONTH IN THE MORNING ON AN EMPTY STOMACH WITH WATER. REMAIN UPRIGHT. 1 tablet 5  . Misc  Natural Products (TUMERSAID) TABS Take 1 capsule by mouth 2 (two) times daily. 60 tablet 3  . nitroGLYCERIN (NITROSTAT) 0.4 MG SL tablet Place 1 tablet (0.4 mg total) under the tongue every 5 (five) minutes as needed for chest pain. 100 tablet prn  . nortriptyline (PAMELOR) 25 MG capsule TAKE 1 CAPSULE AT BEDTIME. 30 capsule 5  . pantoprazole (PROTONIX) 40 MG tablet TAKE 1 TABLET ONCE DAILY. 30 tablet 3  . perphenazine (TRILAFON) 2 MG tablet TAKE ONE TABLET AT BEDTIME. 30 tablet 5  . triazolam (HALCION) 0.25 MG tablet Take 0.5-1 tablets (0.125-0.25 mg total) by mouth at bedtime as needed for sleep. 30 tablet 5  . furosemide (LASIX) 20 MG tablet Take 1 tablet (20 mg total) by mouth as needed. 30 tablet 5  . solifenacin (VESICARE) 10 MG tablet Take 1 tablet (10 mg total) by mouth as needed. 30 tablet 5    ROS: Out of a complete 14 system review, the patient complains of only the following symptoms, and all other reviewed systems are negative.    Physical  Examination: Filed Vitals:   07/03/14 2357  BP: 143/74  Pulse: 89  Temp: 98.1 F (36.7 C)  Resp: 18   Physical Exam  Constitutional: He appears well-developed and well-nourished.  Psych: Affect appropriate to situation Eyes: No scleral injection HENT: No OP obstrucion Head: Normocephalic.  Cardiovascular: Normal rate and regular rhythm.  Respiratory: Effort normal and breath sounds normal.  GI: Soft. Bowel sounds are normal. No distension. There is no tenderness.  Skin: WDI   Neurologic Examination: Mental Status: Alert, oriented, thought content appropriate.  Speech fluent without evidence of aphasia.  Mild dysarthria.  to follow 3 step commands without difficulty. Cranial Nerves: II: funduscopic exam wnl bilaterally, visual fields grossly normal, pupils equal, round, reactive to light and accommodation III,IV, VI: ptosis not present, extra-ocular motions intact bilaterally V,VII: smile symmetric, facial light touch sensation normal bilaterally VIII: hearing normal bilaterally IX,X: gag reflex present XI: trapezius strength/neck flexion strength normal bilaterally XII: tongue strength normal  Motor: Right : Upper extremity    Left:     Upper extremity 5/5 deltoid       5/5 deltoid 5/5 biceps      5/5 biceps  5/5 triceps      5/5 triceps 5/5 hand grip      5/5 hand grip  Lower extremity     Lower extremity 5/5 hip flexor      5/5 hip flexor 5/5 quadricep      5/5 quadriceps  5/5 hamstrings     5/5 hamstrings 5/5 plantar flexion       5/5 plantar flexion 5/5 plantar extension     5/5 plantar extension Tone and bulk:normal tone throughout; no atrophy noted Sensory: Pinprick and light touch intact throughout, bilaterally Deep Tendon Reflexes: 2+ and symmetric throughout Plantars: Right: downgoing   Left: downgoing Cerebellar: normal finger-to-nose, nd normal heel-to-shin test Gait: normal gait and station  Laboratory Studies:   Basic Metabolic Panel:  Recent  Labs Lab 07/03/14 1832  NA 130*  K 4.0  CL 93*  CO2 27  GLUCOSE 108*  BUN 14  CREATININE 0.71  CALCIUM 9.0    Liver Function Tests: No results for input(s): AST, ALT, ALKPHOS, BILITOT, PROT, ALBUMIN in the last 168 hours. No results for input(s): LIPASE, AMYLASE in the last 168 hours. No results for input(s): AMMONIA in the last 168 hours.  CBC:  Recent Labs Lab 07/03/14 1832  WBC 5.1  HGB 12.9  HCT 39.2  MCV 96.1  PLT 380    Cardiac Enzymes:  Recent Labs Lab 07/03/14 1837  TROPONINI <0.03    BNP: Invalid input(s): POCBNP  CBG: No results for input(s): GLUCAP in the last 168 hours.  Microbiology: No results found for this or any previous visit.  Coagulation Studies: No results for input(s): LABPROT, INR in the last 72 hours.  Urinalysis:  Recent Labs Lab 07/03/14 1911  COLORURINE YELLOW  LABSPEC 1.009  PHURINE 7.0  GLUCOSEU NEGATIVE  HGBUR NEGATIVE  BILIRUBINUR NEGATIVE  KETONESUR NEGATIVE  PROTEINUR NEGATIVE  UROBILINOGEN 0.2  NITRITE NEGATIVE  LEUKOCYTESUR MODERATE*    Lipid Panel:     Component Value Date/Time   CHOL 193 06/28/2012 1146   TRIG 60.0 06/28/2012 1146   HDL 92.20 06/28/2012 1146   CHOLHDL 2 06/28/2012 1146   VLDL 12.0 06/28/2012 1146   LDLCALC 89 06/28/2012 1146    HgbA1C:  Lab Results  Component Value Date   HGBA1C 5.9 01/16/2013    Urine Drug Screen:  No results found for: LABOPIA, COCAINSCRNUR, LABBENZ, AMPHETMU, THCU, LABBARB  Alcohol Level: No results for input(s): ETH in the last 168 hours.  Other results:  Imaging: Dg Chest 2 View  07/03/2014   CLINICAL DATA:  Headache.  Slurred speech.  EXAM: CHEST  2 VIEW  COMPARISON:  08/13/2008 ; 04/15/2006  FINDINGS: Mildly tortuous thoracic aorta. Greater than expected 1.4 cm nodular density at the right lung base on the frontal projection, admittedly at the superimposition of 2 ribs, but an intrapulmonary nodule cannot be excluded. This is above the expected level  for a nipple shadow based on the distribution of thoracic tissues.  Cardiac and mediastinal margins appear normal. No pleural effusion. Spondylosis and degenerative disc disease in the vicinity of the lower thorax and upper lumbar spine.  IMPRESSION: 1. Indistinct nodular density at the right lung base. Although potentially related to overlying bony structures, I cannot exclude a true pulmonary nodule and accordingly chest CT is recommended for further characterization, to rule out malignancy.   Electronically Signed   By: Van Clines M.D.   On: 07/03/2014 19:31   Ct Head Wo Contrast  07/03/2014   CLINICAL DATA:  Headache and slurred speech after medication.  EXAM: CT HEAD WITHOUT CONTRAST  TECHNIQUE: Contiguous axial images were obtained from the base of the skull through the vertex without intravenous contrast.  COMPARISON:  10/15/2013  FINDINGS: Skull and Sinuses:Negative for fracture or destructive process. The mastoids, middle ears, and imaged paranasal sinuses are clear. Surgical clips anterior to the bilateral auricles.  Orbits: No acute abnormality.  Brain: No evidence of acute infarction, hemorrhage, hydrocephalus, or mass lesion/mass effect. Generalized brain atrophy which is mild for age. There is diffuse chronic small vessel disease with ischemic gliosis throughout the bilateral cerebral white matter.  IMPRESSION: 1. No acute intracranial findings. 2. Chronic small vessel disease without progression from 2015.   Electronically Signed   By: Monte Fantasia M.D.   On: 07/03/2014 19:41   Mr Jodene Nam Head Wo Contrast  07/03/2014   CLINICAL DATA:  Headache and slurred speech for several days. Aphasia.  EXAM: MRI HEAD WITHOUT CONTRAST  MRA HEAD WITHOUT CONTRAST  TECHNIQUE: Multiplanar, multiecho pulse sequences of the brain and surrounding structures were obtained without intravenous contrast. Angiographic images of the head were obtained using MRA technique without contrast.  COMPARISON:  CT head  without contrast 07/03/2014. MRI brain 04/15/2006  FINDINGS: MRI HEAD FINDINGS  A  21 x 5 mm white matter infarct is present in the posterior right frontal lobe. No other areas of restricted diffusion are evident. T2 changes are associated with the acute infarct. Moderate generalized atrophy and diffuse white matter changes are evident bilaterally. There are remote lacunar infarcts within the basal ganglia bilaterally. White matter changes extend into the brainstem.  Flow is present in the major intracranial arteries. The globes and orbits are intact. The ventricles are proportionate to the degree of atrophy. No significant extraaxial fluid collection is present. Minimal mucosal thickening is present posteriorly in the right maxillary sinus. The remaining paranasal sinuses and the mastoid air cells are clear.  Midline structures are within normal limits. The skullbase is unremarkable.  MRA HEAD FINDINGS  The study is mildly degraded by patient motion.  The internal carotid arteries are within normal limits from the high cervical segments through the ICA termini bilaterally. The A1 segments are normal. Mild irregularity is present in the distal left M1 segment, without a significant stenosis. There is a focal moderate to high-grade stenosis at the origin of the posterior-inferior right M2 segment. There is moderate attenuation of MCA branch vessels bilaterally. Branch vessels are more highly attenuated on the right.  The left vertebral artery is the dominant vessel. The left PICA origin is visualized. The vessel is small. A right AICA is noted. The basilar artery is within normal limits. The both posterior cerebral arteries originate from the basilar tip. A high-grade stenosis is present within the proximal left P2 segment. There is mild distal segmental narrowing bilaterally, worse on the left.  IMPRESSION: 1. Acute nonhemorrhagic 21 mm white matter infarct of the posterior right frontal lobe. 2. Extensive atrophy  and diffuse white matter disease corresponds to advanced chronic microvascular ischemia. 3. Moderate to high-grade stenosis of the infra posterior right M2 branch. 4. Moderate small vessel disease is evident on the MRA. 5. High-grade stenosis in the proximal left P2 segment.   Electronically Signed   By: San Morelle M.D.   On: 07/03/2014 21:04   Mr Brain Wo Contrast  07/03/2014   CLINICAL DATA:  Headache and slurred speech for several days. Aphasia.  EXAM: MRI HEAD WITHOUT CONTRAST  MRA HEAD WITHOUT CONTRAST  TECHNIQUE: Multiplanar, multiecho pulse sequences of the brain and surrounding structures were obtained without intravenous contrast. Angiographic images of the head were obtained using MRA technique without contrast.  COMPARISON:  CT head without contrast 07/03/2014. MRI brain 04/15/2006  FINDINGS: MRI HEAD FINDINGS  A 21 x 5 mm white matter infarct is present in the posterior right frontal lobe. No other areas of restricted diffusion are evident. T2 changes are associated with the acute infarct. Moderate generalized atrophy and diffuse white matter changes are evident bilaterally. There are remote lacunar infarcts within the basal ganglia bilaterally. White matter changes extend into the brainstem.  Flow is present in the major intracranial arteries. The globes and orbits are intact. The ventricles are proportionate to the degree of atrophy. No significant extraaxial fluid collection is present. Minimal mucosal thickening is present posteriorly in the right maxillary sinus. The remaining paranasal sinuses and the mastoid air cells are clear.  Midline structures are within normal limits. The skullbase is unremarkable.  MRA HEAD FINDINGS  The study is mildly degraded by patient motion.  The internal carotid arteries are within normal limits from the high cervical segments through the ICA termini bilaterally. The A1 segments are normal. Mild irregularity is present in the distal left M1 segment,  without a significant stenosis. There is a focal moderate to high-grade stenosis at the origin of the posterior-inferior right M2 segment. There is moderate attenuation of MCA branch vessels bilaterally. Branch vessels are more highly attenuated on the right.  The left vertebral artery is the dominant vessel. The left PICA origin is visualized. The vessel is small. A right AICA is noted. The basilar artery is within normal limits. The both posterior cerebral arteries originate from the basilar tip. A high-grade stenosis is present within the proximal left P2 segment. There is mild distal segmental narrowing bilaterally, worse on the left.  IMPRESSION: 1. Acute nonhemorrhagic 21 mm white matter infarct of the posterior right frontal lobe. 2. Extensive atrophy and diffuse white matter disease corresponds to advanced chronic microvascular ischemia. 3. Moderate to high-grade stenosis of the infra posterior right M2 branch. 4. Moderate small vessel disease is evident on the MRA. 5. High-grade stenosis in the proximal left P2 segment.   Electronically Signed   By: San Morelle M.D.   On: 07/03/2014 21:04    Assessment: 81 y.o. female with history of HTN and HLD presenting with acute onset of slurred speech. MRI shows acute infarct likely related to intracranial atherosclerosis seen on MRA. Admitted for further stroke workup.    Plan: 1. HgbA1c, fasting lipid panel 2. MRI, MRA  of the brain without contrast completed 3. PT consult, OT consult, Speech consult 4. Echocardiogram 5. Carotid dopplers 6. Prophylactic therapy-ASA 325mg  daily 7. Risk factor modification 8. Telemetry monitoring 9. Frequent neuro checks 10. NPO until RN stroke swallow screen   Jim Like, DO [facility] [phone]  If 7pm- 7am, please page neurology on call as listed in AMION. 07/04/2014, 12:06 AM

## 2014-07-04 NOTE — Evaluation (Signed)
Physical Therapy Evaluation Patient Details Name: Sarah [name] MRN: [id number] DOB: 1927/03/08 Today's Date: 07/04/2014   History of Present Illness  81 y.o. female with Hx of HTN and osteoporosis admitted for Right frontal lobe CVA.  Clinical Impression  Pt admitted with the above diagnosis. Pt currently with functional limitations due to the deficits listed below (see PT Problem List). Ambulates with min assist, apparently near baseline per caregiver. Patient however states she feels a bit weaker and is more easily fatigued. No buckling of LEs noted, and patient uses a rollator for stability at baseline. Family is arranging 24 hour supervision at home for an undisclosed period of time. Pt will benefit from skilled PT to increase their independence and safety with mobility to allow discharge to the venue listed below.       Follow Up Recommendations Home health PT;Supervision/Assistance - 24 hour    Equipment Recommendations  None recommended by PT    Recommendations for Other Services       Precautions / Restrictions Precautions Precautions: Fall Restrictions Weight Bearing Restrictions: No      Mobility  Bed Mobility Overal bed mobility: Needs Assistance Bed Mobility: Rolling;Sidelying to Sit Rolling: Min assist Sidelying to sit: Min assist       General bed mobility comments: Min assist to facilitate correct sequencing to roll, min assist for truncal support to rise to edge of bed. No use of rail similar to home.  Transfers Overall transfer level: Needs assistance Equipment used: 2 person hand held assist Transfers: Sit to/from Stand Sit to Stand: Min assist         General transfer comment: Hand held assist for balance. leans posteriorly on on bed. Cues for upright posture. Pt reported lightheadedness upon standing that resolved within 1 minuted. SBP in 140s.  Ambulation/Gait Ambulation/Gait assistance: Min assist Ambulation Distance (Feet): 55 Feet Assistive  device: 1 person hand held assist Gait Pattern/deviations: Step-through pattern;Decreased stride length;Trunk flexed;Narrow base of support Gait velocity: slow   General Gait Details: Intermittent cues for upright posture which pt responds well to. Worsens with fatigue. States her legs feel tired with increased distance. No buckling noted. VC for foward gaze. Hand held assist for balance.  Stairs            Wheelchair Mobility    Modified Rankin (Stroke Patients Only) Modified Rankin (Stroke Patients Only) Pre-Morbid Rankin Score: Moderate disability Modified Rankin: Moderately severe disability     Balance Overall balance assessment: Needs assistance Sitting-balance support: No upper extremity supported;Feet supported Sitting balance-Leahy Scale: Fair     Standing balance support: Single extremity supported Standing balance-Leahy Scale: Poor                               Pertinent Vitals/Pain Pain Assessment: No/denies pain    Home Living Family/patient expects to be discharged to:: Private residence Living Arrangements: Spouse/significant other Available Help at Discharge: Personal care attendant Type of Home: Apartment Home Access: Stairs to enter Entrance Stairs-Rails: None Entrance Stairs-Number of Steps: 1 Home Layout: Two level;Able to live on main level with bedroom/bathroom Home Equipment: Walker - 4 wheels;Kasandra Knudsen - single point Additional Comments: Caregiver reports family is coming into town to care for patient for a period of time until she can be safely independent again.    Prior Function Level of Independence: Needs assistance   Gait / Transfers Assistance Needed: Ambulates with a rollator  ADL's / Homemaking Assistance Needed:  needs assist to bathe her back. some assist to dress  Comments: Provided per caregiver. Gets out into the community regularly     Hand Dominance   Dominant Hand: Right    Extremity/Trunk Assessment   Upper  Extremity Assessment: Defer to OT evaluation           Lower Extremity Assessment: Generalized weakness (no localized differences in strength or sensation noted)         Communication   Communication: No difficulties  Cognition Arousal/Alertness: Awake/alert Behavior During Therapy: WFL for tasks assessed/performed Overall Cognitive Status: Impaired/Different from baseline                      General Comments General comments (skin integrity, edema, etc.): caregiver reports pts gait appears baseline. Pt reports she feels more fatigued and weaker with mobility than baseline.    Exercises        Assessment/Plan    PT Assessment Patient needs continued PT services  PT Diagnosis Difficulty walking;Abnormality of gait;Generalized weakness   PT Problem List Decreased strength;Decreased activity tolerance;Decreased balance;Decreased mobility;Decreased knowledge of use of DME  PT Treatment Interventions DME instruction;Gait training;Stair training;Functional mobility training;Therapeutic activities;Therapeutic exercise;Balance training;Neuromuscular re-education;Patient/family education   PT Goals (Current goals can be found in the Care Plan section) Acute Rehab PT Goals Patient Stated Goal: Go home soon PT Goal Formulation: With patient Time For Goal Achievement: 07/18/14 Potential to Achieve Goals: Good    Frequency Min 4X/week   Barriers to discharge Decreased caregiver support Will have 24/7 supervision from family/caregiver for a period of time, to be determined by family.    Co-evaluation               End of Session Equipment Utilized During Treatment: Gait belt Activity Tolerance: Patient tolerated treatment well Patient left: in chair;with call bell/phone within reach;with family/visitor present Nurse Communication: Mobility status;Other (comment) (Pt sitting in chair with caregiver in room)         Time: 5573-2202 (-10 minutes while RN  obtained urine sample) PT Time Calculation (min) (ACUTE ONLY): 34 min   Charges:   PT Evaluation $Initial PT Evaluation Tier I: 1 Procedure PT Treatments $Gait Training: 8-22 mins   PT G CodesEllouise Newer 07/04/2014, 2:02 PM Camille Bal [place], [place]

## 2014-07-04 NOTE — Progress Notes (Signed)
Patient Demographics  Sarah [name], is a 81 y.o. female, DOB - Nov 02, 1926, OHY:[id number]  Admit date - 07/03/2014   Admitting Physician Etta Quill, DO  Outpatient Primary MD for the patient is Gwendolyn Grant, MD  LOS - 1   Chief Complaint  Patient presents with  . Headache  . Aphasia        Subjective:   Sarah [name] today has, No headache, No chest pain, No abdominal pain - No Nausea, No new weakness tingling or numbness, No Cough - SOB. Speech improving   Assessment & Plan   1. Right frontal lobe CVA. Causing some slurred speech. Symptoms improving, seen by neurology, full stroke workup underway. On aspirin 325. We will monitor on tele, obtain PT, OT, speech input, A1c and lipid panel, check  echogram, carotid duplex.   Lab Results  Component Value Date   HGBA1C 5.9 01/16/2013    Lab Results  Component Value Date   CHOL 193 06/28/2012   HDL 92.20 06/28/2012   LDLCALC 89 06/28/2012   LDLDIRECT 101.1 10/21/2010   TRIG 60.0 06/28/2012   CHOLHDL 2 06/28/2012     2. GERD. Continue PPI.   3. Anxiety. Low-dose Xanax continued.   4. DJD. Supportive care.   5. Hyponatremia. He was on low-dose Lasix. Appears euvolemic, check urine sodium-osmolality, serum os. T. Repeat BMP the morning.     Code Status:Full  Family Communication: caregiver bedside  Disposition Plan: TBD   Procedures   CT Head, MRI-A Brain, TTE, Carotids   Consults  Neuro   Medications  Scheduled Meds: . ALPRAZolam  1 mg Oral BID  . aspirin  325 mg Oral Daily  . heparin  5,000 Units Subcutaneous 3 times per day  . nortriptyline  25 mg Oral QHS  . pantoprazole  40 mg Oral Daily  . perphenazine  2 mg Oral QHS   Continuous Infusions:  PRN Meds:.acetaminophen, triazolam  DVT  Prophylaxis   Heparin   Lab Results  Component Value Date   PLT 380 07/03/2014    Antibiotics     Anti-infectives    Start     Dose/Rate Route Frequency Ordered Stop   07/03/14 2115  cefTRIAXone (ROCEPHIN) 1 g in dextrose 5 % 50 mL IVPB     1 g 100 mL/hr over 30 Minutes Intravenous  Once 07/03/14 2100 07/03/14 2135          Objective:   Filed Vitals:   07/03/14 2357 07/04/14 0200 07/04/14 0401 07/04/14 0600  BP: 143/74 115/45 120/52 123/54  Pulse: 89 77 79 73  Temp: 98.1 F (36.7 C)  98.1 F (36.7 C)   TempSrc: Oral  Oral   Resp: 18 18 18 18   Height: 5\' 2"  (1.575 m)     Weight: 72.53 kg (159 lb 14.4 oz)     SpO2: 99% 99% 96% 95%    Wt Readings from Last 3 Encounters:  07/03/14 72.53 kg (159 lb 14.4 oz)  07/03/14 70.308 kg (155 lb)  06/12/14 71.442 kg (157 lb 8 oz)    No intake or output data in the 24 hours ending 07/04/14 0943   Physical Exam  Awake Alert, Oriented X 3, No new F.N deficits, Normal affect, mildly slurred speech  [place].AT,PERRAL Supple Neck,No JVD, No cervical lymphadenopathy appriciated.  Symmetrical Chest wall movement, Good air movement bilaterally, CTAB RRR,No Gallops,Rubs or new Murmurs, No Parasternal Heave +ve B.Sounds, Abd Soft, No tenderness, No organomegaly appriciated, No rebound - guarding or rigidity. No Cyanosis, Clubbing or edema, No new Rash or bruise      Data Review   Micro Results No results found for this or any previous visit (from the past 240 hour(s)).  Radiology Reports Dg Chest 2 View  07/03/2014   CLINICAL DATA:  Headache.  Slurred speech.  EXAM: CHEST  2 VIEW  COMPARISON:  08/13/2008 ; 04/15/2006  FINDINGS: Mildly tortuous thoracic aorta. Greater than expected 1.4 cm nodular density at the right lung base on the frontal projection, admittedly at the superimposition of 2 ribs, but an intrapulmonary nodule cannot be excluded. This is above the expected level for a nipple shadow based on the distribution of thoracic  tissues.  Cardiac and mediastinal margins appear normal. No pleural effusion. Spondylosis and degenerative disc disease in the vicinity of the lower thorax and upper lumbar spine.  IMPRESSION: 1. Indistinct nodular density at the right lung base. Although potentially related to overlying bony structures, I cannot exclude a true pulmonary nodule and accordingly chest CT is recommended for further characterization, to rule out malignancy.   Electronically Signed   By: Van Clines M.D.   On: 07/03/2014 19:31   Ct Head Wo Contrast  07/03/2014   CLINICAL DATA:  Headache and slurred speech after medication.  EXAM: CT HEAD WITHOUT CONTRAST  TECHNIQUE: Contiguous axial images were obtained from the base of the skull through the vertex without intravenous contrast.  COMPARISON:  10/15/2013  FINDINGS: Skull and Sinuses:Negative for fracture or destructive process. The mastoids, middle ears, and imaged paranasal sinuses are clear. Surgical clips anterior to the bilateral auricles.  Orbits: No acute abnormality.  Brain: No evidence of acute infarction, hemorrhage, hydrocephalus, or mass lesion/mass effect. Generalized brain atrophy which is mild for age. There is diffuse chronic small vessel disease with ischemic gliosis throughout the bilateral cerebral white matter.  IMPRESSION: 1. No acute intracranial findings. 2. Chronic small vessel disease without progression from 2015.   Electronically Signed   By: Monte Fantasia M.D.   On: 07/03/2014 19:41   Mr Jodene Nam Head Wo Contrast  07/03/2014   CLINICAL DATA:  Headache and slurred speech for several days. Aphasia.  EXAM: MRI HEAD WITHOUT CONTRAST  MRA HEAD WITHOUT CONTRAST  TECHNIQUE: Multiplanar, multiecho pulse sequences of the brain and surrounding structures were obtained without intravenous contrast. Angiographic images of the head were obtained using MRA technique without contrast.  COMPARISON:  CT head without contrast 07/03/2014. MRI brain 04/15/2006  FINDINGS: MRI  HEAD FINDINGS  A 21 x 5 mm white matter infarct is present in the posterior right frontal lobe. No other areas of restricted diffusion are evident. T2 changes are associated with the acute infarct. Moderate generalized atrophy and diffuse white matter changes are evident bilaterally. There are remote lacunar infarcts within the basal ganglia bilaterally. White matter changes extend into the brainstem.  Flow is present in the major intracranial arteries. The globes and orbits are intact. The ventricles are proportionate to the degree of atrophy. No significant extraaxial fluid collection is present. Minimal mucosal thickening is present posteriorly in the right maxillary sinus. The remaining paranasal sinuses and the mastoid air cells are clear.  Midline structures are within normal limits. The skullbase is unremarkable.  MRA HEAD FINDINGS  The study  is mildly degraded by patient motion.  The internal carotid arteries are within normal limits from the high cervical segments through the ICA termini bilaterally. The A1 segments are normal. Mild irregularity is present in the distal left M1 segment, without a significant stenosis. There is a focal moderate to high-grade stenosis at the origin of the posterior-inferior right M2 segment. There is moderate attenuation of MCA branch vessels bilaterally. Branch vessels are more highly attenuated on the right.  The left vertebral artery is the dominant vessel. The left PICA origin is visualized. The vessel is small. A right AICA is noted. The basilar artery is within normal limits. The both posterior cerebral arteries originate from the basilar tip. A high-grade stenosis is present within the proximal left P2 segment. There is mild distal segmental narrowing bilaterally, worse on the left.  IMPRESSION: 1. Acute nonhemorrhagic 21 mm white matter infarct of the posterior right frontal lobe. 2. Extensive atrophy and diffuse white matter disease corresponds to advanced chronic  microvascular ischemia. 3. Moderate to high-grade stenosis of the infra posterior right M2 branch. 4. Moderate small vessel disease is evident on the MRA. 5. High-grade stenosis in the proximal left P2 segment.   Electronically Signed   By: San Morelle M.D.   On: 07/03/2014 21:04   Mr Brain Wo Contrast  07/03/2014   CLINICAL DATA:  Headache and slurred speech for several days. Aphasia.  EXAM: MRI HEAD WITHOUT CONTRAST  MRA HEAD WITHOUT CONTRAST  TECHNIQUE: Multiplanar, multiecho pulse sequences of the brain and surrounding structures were obtained without intravenous contrast. Angiographic images of the head were obtained using MRA technique without contrast.  COMPARISON:  CT head without contrast 07/03/2014. MRI brain 04/15/2006  FINDINGS: MRI HEAD FINDINGS  A 21 x 5 mm white matter infarct is present in the posterior right frontal lobe. No other areas of restricted diffusion are evident. T2 changes are associated with the acute infarct. Moderate generalized atrophy and diffuse white matter changes are evident bilaterally. There are remote lacunar infarcts within the basal ganglia bilaterally. White matter changes extend into the brainstem.  Flow is present in the major intracranial arteries. The globes and orbits are intact. The ventricles are proportionate to the degree of atrophy. No significant extraaxial fluid collection is present. Minimal mucosal thickening is present posteriorly in the right maxillary sinus. The remaining paranasal sinuses and the mastoid air cells are clear.  Midline structures are within normal limits. The skullbase is unremarkable.  MRA HEAD FINDINGS  The study is mildly degraded by patient motion.  The internal carotid arteries are within normal limits from the high cervical segments through the ICA termini bilaterally. The A1 segments are normal. Mild irregularity is present in the distal left M1 segment, without a significant stenosis. There is a focal moderate to  high-grade stenosis at the origin of the posterior-inferior right M2 segment. There is moderate attenuation of MCA branch vessels bilaterally. Branch vessels are more highly attenuated on the right.  The left vertebral artery is the dominant vessel. The left PICA origin is visualized. The vessel is small. A right AICA is noted. The basilar artery is within normal limits. The both posterior cerebral arteries originate from the basilar tip. A high-grade stenosis is present within the proximal left P2 segment. There is mild distal segmental narrowing bilaterally, worse on the left.  IMPRESSION: 1. Acute nonhemorrhagic 21 mm white matter infarct of the posterior right frontal lobe. 2. Extensive atrophy and diffuse white matter disease corresponds to advanced chronic  microvascular ischemia. 3. Moderate to high-grade stenosis of the infra posterior right M2 branch. 4. Moderate small vessel disease is evident on the MRA. 5. High-grade stenosis in the proximal left P2 segment.   Electronically Signed   By: San Morelle M.D.   On: 07/03/2014 21:04     CBC  Recent Labs Lab 07/03/14 1832  WBC 5.1  HGB 12.9  HCT 39.2  PLT 380  MCV 96.1  MCH 31.6  MCHC 32.9  RDW 12.9    Chemistries   Recent Labs Lab 07/03/14 1832  NA 130*  K 4.0  CL 93*  CO2 27  GLUCOSE 108*  BUN 14  CREATININE 0.71  CALCIUM 9.0   ------------------------------------------------------------------------------------------------------------------ estimated creatinine clearance is 48 mL/min (by C-G formula based on Cr of 0.71). ------------------------------------------------------------------------------------------------------------------ No results for input(s): HGBA1C in the last 72 hours. ------------------------------------------------------------------------------------------------------------------ No results for input(s): CHOL, HDL, LDLCALC, TRIG, CHOLHDL, LDLDIRECT in the last 72  hours. ------------------------------------------------------------------------------------------------------------------ No results for input(s): TSH, T4TOTAL, T3FREE, THYROIDAB in the last 72 hours.  Invalid input(s): FREET3 ------------------------------------------------------------------------------------------------------------------ No results for input(s): VITAMINB12, FOLATE, FERRITIN, TIBC, IRON, RETICCTPCT in the last 72 hours.  Coagulation profile No results for input(s): INR, PROTIME in the last 168 hours.  No results for input(s): DDIMER in the last 72 hours.  Cardiac Enzymes  Recent Labs Lab 07/03/14 1837  TROPONINI <0.03   ------------------------------------------------------------------------------------------------------------------ Invalid input(s): POCBNP     Time Spent in minutes  35   Emani Morad K M.D on 07/04/2014 at 9:43 AM  Between 7am to 7pm - Pager - [phone]  After 7pm go to [email or web address] - password [facility]  [facility]   Office  [phone]

## 2014-07-04 NOTE — Progress Notes (Signed)
*  PRELIMINARY RESULTS* Vascular Ultrasound Carotid Duplex (Doppler) has been completed.   Findings suggest 1-39% internal carotid artery stenosis bilaterally. Vertebral arteries are patent with antegrade flow.  07/04/2014 4:04 PM Maudry Mayhew, RVT, RDCS, RDMS

## 2014-07-04 NOTE — Evaluation (Signed)
Occupational Therapy Evaluation Patient Details Name: Sarah [name] MRN: [id number] DOB: April 15, 1926 Today's Date: 07/04/2014    History of Present Illness 81 y.o. female with Hx of HTN and osteoporosis admitted for Right frontal lobe CVA.   Clinical Impression   Pt overall min assist to min guard assist for selfcare tasks, toileting, and standing balance during grooming tasks at the sink.  She needs mod demonstrational cueing for safe use of the rollator as she does not lock the brakes appropriately.  Will have 24 hour assistance at discharge from family and hired help.  Feel she will benefit from [facility] [place] eval and treat at discharge.  No further acute care OT needs as pt will likely discharge in the next day.         Equipment Recommendations  None recommended by OT       Precautions / Restrictions Precautions Precautions: Fall Restrictions Weight Bearing Restrictions: No      Mobility Bed Mobility Overal bed mobility: Needs Assistance Bed Mobility: Rolling;Sidelying to Sit Rolling: Min assist Sidelying to sit: Min assist       General bed mobility comments: Min assist to facilitate correct sequencing to roll, min assist for truncal support to rise to edge of bed. No use of rail similar to home.  Transfers Overall transfer level: Needs assistance Equipment used: Rolling walker (2 wheeled) Transfers: Sit to/from Stand Sit to Stand: Min assist         General transfer comment: Hand held assist for balance. leans posteriorly on on bed. Cues for upright posture. Pt reported lightheadedness upon standing that resolved within 1 minuted. SBP in 140s.    Balance Overall balance assessment: Needs assistance Sitting-balance support: No upper extremity supported;Feet supported Sitting balance-Leahy Scale: Fair     Standing balance support: Single extremity supported Standing balance-Leahy Scale: Poor                              ADL Overall ADL's : Needs  assistance/impaired Eating/Feeding: Independent   Grooming: Wash/dry hands;Wash/dry face;Oral care;Standing;Minimal assistance   Upper Body Bathing: Supervision/ safety   Lower Body Bathing: Minimal assistance;Sit to/from stand   Upper Body Dressing : Sitting;Minimal assistance Upper Body Dressing Details (indicate cue type and reason): Per report pt occasionally needs assistance with bra. Lower Body Dressing: Minimal assistance;Sit to/from stand   Toilet Transfer: Minimal assistance;Comfort height toilet;Ambulation;RW   Toileting- Clothing Manipulation and Hygiene: Minimal assistance;Sit to/from stand       Functional mobility during ADLs: Minimal assistance;Rolling walker General ADL Comments: Pt's daughter and hired caregiver both present for session as well.  Pt needing cues to use rollator safely as she does not lock the brakes appropriately.       Vision Vision Assessment?: Yes;No apparent visual deficits Eye Alignment: Within Functional Limits Ocular Range of Motion: Within Functional Limits Visual Fields: No apparent deficits   Agricultural engineer Tested?: No   Praxis Praxis Praxis tested?: Not tested    Pertinent Vitals/Pain Pain Assessment: No/denies pain     Hand Dominance Right   Extremity/Trunk Assessment Upper Extremity Assessment Upper Extremity Assessment: Overall WFL for tasks assessed   Lower Extremity Assessment Lower Extremity Assessment: Defer to PT evaluation   Cervical / Trunk Assessment Cervical / Trunk Assessment: Kyphotic   Communication Communication Communication: No difficulties   Cognition Arousal/Alertness: Awake/alert Behavior During Therapy: WFL for tasks assessed/performed Overall Cognitive Status: Impaired/Different from baseline Area of Impairment: Memory;Safety/judgement  Safety/Judgement: Decreased awareness of safety     General Comments: Pt forgets to lock brakes on the rollator as well as not  reaching back for surfaces when attempting sit to stand.               Home Living Family/patient expects to be discharged to:: Private residence Living Arrangements: Spouse/significant other Available Help at Discharge: Personal care attendant Type of Home: Apartment Home Access: Stairs to enter [employer] of Steps: 1 Entrance Stairs-Rails: None Home Layout: Two level;Able to live on main level with bedroom/bathroom               Home Equipment: Walker - 4 wheels;Kasandra Knudsen - single point   Additional Comments: Caregiver reports family is coming into town to care for patient for a period of time until she can be safely independent again.  Lives With: Spouse    Prior Functioning/Environment Level of Independence: Needs assistance  Gait / Transfers Assistance Needed: Ambulates with a rollator ADL's / Homemaking Assistance Needed: needs assist to bathe her back. some assist to dress   Comments: Provided per caregiver. Gets out into the community regularly    OT Diagnosis: Generalized weakness;Cognitive deficits   OT Problem List: Decreased activity tolerance;Impaired balance (sitting and/or standing);Decreased knowledge of use of DME or AE;Decreased strength;Decreased safety awareness   OT Treatment/Interventions:      OT Goals(Current goals can be found in the care plan section) Acute Rehab OT Goals Patient Stated Goal: Go home soon                End of Session Equipment Utilized During Treatment: Rolling walker Nurse Communication: Mobility status  Activity Tolerance: Patient tolerated treatment well Patient left: in chair;with call bell/phone within reach;with nursing/sitter in room;with family/visitor present   Time: 7290-2111 OT Time Calculation (min): 45 min Charges:  OT General Charges $OT Visit: 1 Procedure OT Evaluation $Initial OT Evaluation Tier I: 1 Procedure OT Treatments $Self Care/Home Management : 38-52 mins  Kayman Snuffer  OTR/L 07/04/2014, 2:53 PM

## 2014-07-04 NOTE — Evaluation (Signed)
Speech Language Pathology Evaluation Patient Details Name: Sarah [name] MRN: [id number] DOB: Mar 21, 1927 Today's Date: 07/04/2014 Time: 6295-2841 SLP Time Calculation (min) (ACUTE ONLY): 15 min  Problem List:  Patient Active Problem List   Diagnosis Date Noted  . Acute ischemic right MCA stroke 07/03/2014  . Degenerative arthritis of knee, bilateral 03/01/2014  . Fall 10/05/2013  . Hyponatremia 10/05/2013  . Memory deficits 10/05/2013  . Arthritis of sacroiliac joint 09/11/2013  . Lumbosacral spondylosis without myelopathy 02/26/2013  . Abnormality of gait 12/15/2012  . Vaginitis and vulvovaginitis 12/06/2012  . Right shoulder pain   . Vitamin B12 deficiency 04/13/2012  . Hypercholesterolemia 11/05/2011  . Osteoporosis, postmenopausal 06/02/2011  . Mixed incontinence urge and stress 06/02/2011  . Anxiety   . Edema 04/06/2011  . Tachycardia 04/06/2011  . Osteoarthritis   . Chest pain    Past Medical History:  Past Medical History  Diagnosis Date  . Osteoarthritis   . Bladder incontinence   . Constipation     hx of  . Hypertension   . Hyperlipidemia   . Chest pain     S/P nuclear 07/17/2008  normal; EF 88% with no ischemia  . Chronic venous insufficiency     BLE edema, chronic, neg venous US 2/13  . History of chicken pox   . Depression   . Diverticulitis   . Polyp of colon   . Osteoporosis, postmenopausal 06/02/2011  . Esophageal stricture     s/p dilation 04/2012  . GERD (gastroesophageal reflux disease)    Past Surgical History:  Past Surgical History  Procedure Laterality Date  . Appendectomy      age 36  . Tubal ligation    . Polypectomy    . Colonoscopy    . Upper gastrointestinal endoscopy     HPI:  81 y.o. female admitted with headache, and multi day history of slurred speech.MRI acute nonhemorrhagic 21 mm white matter infarct of the posterior right frontal lobe. PMH: HTN, GERD, esophageal dilation 13 times, most recent was 6 yrs ago. CXR no acute  intracranial findings. No prior ST services found in documentation.   Assessment / Plan / Recommendation Clinical Impression  Pt demonstrated minimal dysarthria which caregiver reports is different from baseline. Working and prospective memory decreased confirmed by caregiver. SLP recommends ST in acute care and home health.      SLP Assessment  Patient needs continued Speech Lanaguage Pathology Services    Follow Up Recommendations  Outpatient SLP    Frequency and Duration min 1 x/week  2 weeks   Pertinent Vitals/Pain Pain Assessment: No/denies pain   SLP Goals  Potential to Achieve Goals (ACUTE ONLY): Good  SLP Evaluation Prior Functioning  Cognitive/Linguistic Baseline: Within functional limits Type of Home: Apartment  Lives With: Spouse Available Help at Discharge: Personal care attendant   Cognition  Overall Cognitive Status: Impaired/Different from baseline Arousal/Alertness: Awake/alert Orientation Level: Oriented X4 Attention: Sustained Sustained Attention: Appears intact Memory: Impaired Memory Impairment: Decreased short term memory;Decreased recall of new information;Prospective memory Awareness: Appears intact Problem Solving: Appears intact Safety/Judgment: Appears intact    Comprehension  Auditory Comprehension Overall Auditory Comprehension: Appears within functional limits for tasks assessed Visual Recognition/Discrimination Discrimination: Not tested Reading Comprehension Reading Status: Not tested    Expression Expression Primary Mode of Expression: Verbal Verbal Expression Overall Verbal Expression: Appears within functional limits for tasks assessed Naming: No impairment Pragmatics: No impairment Written Expression Dominant Hand: Right Written Expression: Not tested   Oral / Motor Oral Motor/Sensory  Function Overall Oral Motor/Sensory Function: Appears within functional limits for tasks assessed Motor Speech Overall Motor Speech:  Impaired Intelligibility:  (intelligible with trace phonemic distortions) Motor Planning: Witnin functional limits   GO     Houston Siren 07/04/2014, 1:53 PM   Orbie Pyo Colvin Caroli.Ed [employer] [phone]

## 2014-07-05 ENCOUNTER — Telehealth: Payer: Self-pay | Admitting: *Deleted

## 2014-07-05 LAB — LIPID PANEL
CHOL/HDL RATIO: 2.5 ratio
Cholesterol: 187 mg/dL (ref 0–200)
HDL: 74 mg/dL (ref >39)
LDL Cholesterol: 98 mg/dL (ref 0–99)
Triglycerides: 74 mg/dL (ref <150)
VLDL: 15 mg/dL (ref 0–40)

## 2014-07-05 LAB — URINE CULTURE: Special Requests: NORMAL

## 2014-07-05 LAB — BASIC METABOLIC PANEL
Anion gap: 5 (ref 5–15)
BUN: 10 mg/dL (ref 6–23)
CO2: 27 mmol/L (ref 19–32)
CREATININE: 0.77 mg/dL (ref 0.50–1.10)
Calcium: 8.4 mg/dL (ref 8.4–10.5)
Chloride: 98 mmol/L (ref 96–112)
GFR calc Af Amer: 86 mL/min — ABNORMAL LOW (ref >90)
GFR calc non Af Amer: 74 mL/min — ABNORMAL LOW (ref >90)
GLUCOSE: 107 mg/dL — AB (ref 70–99)
Potassium: 4.3 mmol/L (ref 3.5–5.1)
Sodium: 130 mmol/L — ABNORMAL LOW (ref 135–145)

## 2014-07-05 MED ORDER — PITAVASTATIN CALCIUM 1 MG PO TABS: 1.0000 mg | ORAL_TABLET | Freq: Every day | ORAL | Status: DC

## 2014-07-05 MED ORDER — CLOPIDOGREL BISULFATE 75 MG PO TABS: 75.0000 mg | ORAL_TABLET | Freq: Every day | ORAL | Status: DC

## 2014-07-05 NOTE — Discharge Summary (Signed)
Sarah [name], is a 81 y.o. female  DOB 09/05/1926  MRN [id number].  Admission date:  07/03/2014  Admitting Physician  Etta Quill, DO  Discharge Date:  07/05/2014   Primary MD  Gwendolyn Grant, MD  Recommendations for primary care physician for things to follow:   Monitor secondary to his factors for stroke.  Follow up pending A1c.   Admission Diagnosis  Aphasia [R47.01] Acute ischemic stroke [I63.50] Headache [R51]   Discharge Diagnosis  Aphasia [R47.01] Acute ischemic stroke [I63.50] Headache [R51]     Principal Problem:   Acute ischemic right MCA stroke Active Problems:   Osteoporosis, postmenopausal   Vitamin B12 deficiency   Right shoulder pain   Hyponatremia      Past Medical History  Diagnosis Date  . Osteoarthritis   . Bladder incontinence   . Constipation     hx of  . Hypertension   . Hyperlipidemia   . Chest pain     S/P nuclear 07/17/2008  normal; EF 88% with no ischemia  . Chronic venous insufficiency     BLE edema, chronic, neg venous US 2/13  . History of chicken pox   . Depression   . Diverticulitis   . Polyp of colon   . Osteoporosis, postmenopausal 06/02/2011  . Esophageal stricture     s/p dilation 04/2012  . GERD (gastroesophageal reflux disease)     Past Surgical History  Procedure Laterality Date  . Appendectomy      age 33  . Tubal ligation    . Polypectomy    . Colonoscopy    . Upper gastrointestinal endoscopy         History of present illness and  Hospital Course:     Kindly see H&P for history of present illness and admission details, please review complete Labs, Consult reports and Test reports for all details in brief  HPI  from the history and physical done on the day of admission  Sarah [name] is a 81 y.o. female who presents to the ED  with c/o headache, and multi day history of slurred speech. Symptoms onset a couple of days ago, and having mildly slurred speech since then that has been persistent. She reports no difficulty with word finding. Because this slurred speech was persisting she called her neurologist (who she sees for chronic peripheral neuropathy and multifactorial gait instability). Her neurologist recommended she come in to the ED as she might be having a stroke.  In the ED there was some initial question of UTI and she was given a dose of rocephin. Ultimately MRI does indeed confirm the presence of an acute ischemic infarct.   Hospital Course    1. Right frontal lobe CVA. Causing some slurred speech. Symptoms improving and almost completely resolved, seen by neurology, was on aspirin switched to Plavix, LDL was above goal and hence placed on Livalo (statin) which is the least side effects, she had some muscle aches with Lipitor in the past, her A1c is pending and will  request PCP to monitor her history of diabetes last A1c in 2014 was 5.9. She was seen by PT-OT and speech and will get PT along with home health aid at home. Discussed the plan with daughter at bedside agreeable with the plan. Echogram carotid duplex stable, will follow with neurology post discharge.    Recent Labs    Lab Results  Component Value Date   HGBA1C 5.9 01/16/2013       Recent Labs    Lab Results  Component Value Date   CHOL 193 06/28/2012   HDL 92.20 06/28/2012   LDLCALC 89 06/28/2012   LDLDIRECT 101.1 10/21/2010   TRIG 60.0 06/28/2012   CHOLHDL 2 06/28/2012       2. GERD. Continue PPI.   3. Anxiety. Low-dose Xanax continued.   4. DJD. Supportive care.   5. Chronic Hyponatremia. Continue home dose Lasix is intermittent from this standpoint.         Discharge Condition: Stable   Follow UP  Follow-up Information    Follow up with Gwendolyn Grant, MD. Schedule an  appointment as soon as possible for a visit in 1 week.   Specialty:  Internal Medicine   Contact information:   [address] [place] [address] [phone]       Follow up with SETHI,PRAMOD, MD. Schedule an appointment as soon as possible for a visit in 1 month.   Specialties:  Neurology, Radiology   Why:  CVA   Contact information:   [address] [place]  [address] [phone]         Discharge Instructions  and  Discharge Medications      Discharge Instructions    Diet - low sodium heart healthy    Complete by:  As directed      Discharge instructions    Complete by:  As directed   Follow with Primary MD Gwendolyn Grant, MD in 7 days   Get CBC, CMP, 2 view Chest X ray checked  by Primary MD next visit.    Activity: As tolerated with Full fall precautions use walker/cane & assistance as needed   Disposition Home     Diet: Heart Healthy    For Heart failure patients - Check your Weight same time everyday, if you gain over 2 pounds, or you develop in leg swelling, experience more shortness of breath or chest pain, call your Primary MD immediately. Follow Cardiac Low Salt Diet and 1.5 lit/day fluid restriction.   On your next visit with your primary care physician please Get Medicines reviewed and adjusted.   Please request your Prim.MD to go over all Hospital Tests and Procedure/Radiological results at the follow up, please get all Hospital records sent to your Prim MD by signing hospital release before you go home.   If you experience worsening of your admission symptoms, develop shortness of breath, life threatening emergency, suicidal or homicidal thoughts you must seek medical attention immediately by calling 911 or calling your MD immediately  if symptoms less severe.  You Must read complete instructions/literature along with all the possible adverse reactions/side effects for all the Medicines you take and  that have been prescribed to you. Take any new Medicines after you have completely understood and accpet all the possible adverse reactions/side effects.   Do not drive, operating heavy machinery, perform activities at heights, swimming or participation in water activities or provide baby sitting services if your were admitted for syncope  or siezures until you have seen by Primary MD or a Neurologist and advised to do so again.  Do not drive when taking Pain medications.    Do not take more than prescribed Pain, Sleep and Anxiety Medications  Special Instructions: If you have smoked or chewed Tobacco  in the last 2 yrs please stop smoking, stop any regular Alcohol  and or any Recreational drug use.  Wear Seat belts while driving.   Please note  You were cared for by a hospitalist during your hospital stay. If you have any questions about your discharge medications or the care you received while you were in the hospital after you are discharged, you can call the unit and asked to speak with the hospitalist on call if the hospitalist that took care of you is not available. Once you are discharged, your primary care physician will handle any further medical issues. Please note that NO REFILLS for any discharge medications will be authorized once you are discharged, as it is imperative that you return to your primary care physician (or establish a relationship with a primary care physician if you do not have one) for your aftercare needs so that they can reassess your need for medications and monitor your lab values.     Increase activity slowly    Complete by:  As directed             Medication List    STOP taking these medications        aspirin 81 MG tablet      TAKE these medications        acetaminophen 500 MG tablet  Commonly known as:  TYLENOL  Take 1 tablet (500 mg total) by mouth every 8 (eight) hours as needed.     ALPRAZolam 1 MG tablet  Commonly known as:  XANAX  Take  1 tablet (1 mg total) by mouth 2 (two) times daily.     clobetasol ointment 0.05 %  Commonly known as:  TEMOVATE  Apply 1 application topically 2 (two) times daily.     clopidogrel 75 MG tablet  Commonly known as:  PLAVIX  Take 1 tablet (75 mg total) by mouth daily.     cyanocobalamin 1000 MCG/ML injection  Commonly known as:  (VITAMIN B-12)  INJECT 1 ML INTO THE MUSCLE EVERY 30 DAYS.     diclofenac sodium 1 % Gel  Commonly known as:  VOLTAREN  Apply  To feet 3 times daily prn for pain     furosemide 20 MG tablet  Commonly known as:  LASIX  Take 1 tablet (20 mg total) by mouth as needed.     Glucosamine HCl 750 MG Tabs  Take 1 tablet by mouth 2 (two) times daily.     ibandronate 150 MG tablet  Commonly known as:  BONIVA  TAKE ONE TABLET ONCE A MONTH IN THE MORNING ON AN EMPTY STOMACH WITH WATER. REMAIN UPRIGHT.     nitroGLYCERIN 0.4 MG SL tablet  Commonly known as:  NITROSTAT  Place 1 tablet (0.4 mg total) under the tongue every 5 (five) minutes as needed for chest pain.     nortriptyline 25 MG capsule  Commonly known as:  PAMELOR  TAKE 1 CAPSULE AT BEDTIME.     pantoprazole 40 MG tablet  Commonly known as:  PROTONIX  TAKE 1 TABLET ONCE DAILY.     perphenazine 2 MG tablet  Commonly known as:  TRILAFON  TAKE ONE TABLET AT BEDTIME.  Pitavastatin Calcium 1 MG Tabs  Commonly known as:  LIVALO  Take 1 tablet (1 mg total) by mouth daily.     solifenacin 10 MG tablet  Commonly known as:  VESICARE  Take 1 tablet (10 mg total) by mouth as needed.     triazolam 0.25 MG tablet  Commonly known as:  HALCION  Take 0.5-1 tablets (0.125-0.25 mg total) by mouth at bedtime as needed for sleep.     TUMERSAID Tabs  Take 1 capsule by mouth 2 (two) times daily.     VITAMIN D PO  Take 1 tablet by mouth daily.          Diet and Activity recommendation: See Discharge Instructions above   Consults obtained - Neuro   Major procedures and Radiology Reports - PLEASE  review detailed and final reports for all details, in brief -   TTE  - Left ventricle: The cavity size was normal. Wall thickness was normal. Systolic function was normal. The estimated ejection fraction was in the range of 60% to 65%. Wall motion was normal; there were no regional wall motion abnormalities. Doppler parameters are consistent with abnormal left ventricular relaxation (grade 1 diastolic dysfunction). - Aortic valve: There was mild regurgitation. - Mitral valve: There was mild regurgitation.   Vascular Ultrasound Carotid Duplex (Doppler) has been completed.  Findings suggest 1-39% internal carotid artery stenosis bilaterally. Vertebral arteries are patent with antegrade flow.   Dg Chest 2 View  07/03/2014   CLINICAL DATA:  Headache.  Slurred speech.  EXAM: CHEST  2 VIEW  COMPARISON:  08/13/2008 ; 04/15/2006  FINDINGS: Mildly tortuous thoracic aorta. Greater than expected 1.4 cm nodular density at the right lung base on the frontal projection, admittedly at the superimposition of 2 ribs, but an intrapulmonary nodule cannot be excluded. This is above the expected level for a nipple shadow based on the distribution of thoracic tissues.  Cardiac and mediastinal margins appear normal. No pleural effusion. Spondylosis and degenerative disc disease in the vicinity of the lower thorax and upper lumbar spine.  IMPRESSION: 1. Indistinct nodular density at the right lung base. Although potentially related to overlying bony structures, I cannot exclude a true pulmonary nodule and accordingly chest CT is recommended for further characterization, to rule out malignancy.   Electronically Signed   By: Van Clines M.D.   On: 07/03/2014 19:31   Ct Head Wo Contrast  07/03/2014   CLINICAL DATA:  Headache and slurred speech after medication.  EXAM: CT HEAD WITHOUT CONTRAST  TECHNIQUE: Contiguous axial images were obtained from the base of the skull through the vertex without  intravenous contrast.  COMPARISON:  10/15/2013  FINDINGS: Skull and Sinuses:Negative for fracture or destructive process. The mastoids, middle ears, and imaged paranasal sinuses are clear. Surgical clips anterior to the bilateral auricles.  Orbits: No acute abnormality.  Brain: No evidence of acute infarction, hemorrhage, hydrocephalus, or mass lesion/mass effect. Generalized brain atrophy which is mild for age. There is diffuse chronic small vessel disease with ischemic gliosis throughout the bilateral cerebral white matter.  IMPRESSION: 1. No acute intracranial findings. 2. Chronic small vessel disease without progression from 2015.   Electronically Signed   By: Monte Fantasia M.D.   On: 07/03/2014 19:41   Mr Jodene Nam Head Wo Contrast  07/03/2014   CLINICAL DATA:  Headache and slurred speech for several days. Aphasia.  EXAM: MRI HEAD WITHOUT CONTRAST  MRA HEAD WITHOUT CONTRAST  TECHNIQUE: Multiplanar, multiecho pulse sequences of the brain and  surrounding structures were obtained without intravenous contrast. Angiographic images of the head were obtained using MRA technique without contrast.  COMPARISON:  CT head without contrast 07/03/2014. MRI brain 04/15/2006  FINDINGS: MRI HEAD FINDINGS  A 21 x 5 mm white matter infarct is present in the posterior right frontal lobe. No other areas of restricted diffusion are evident. T2 changes are associated with the acute infarct. Moderate generalized atrophy and diffuse white matter changes are evident bilaterally. There are remote lacunar infarcts within the basal ganglia bilaterally. White matter changes extend into the brainstem.  Flow is present in the major intracranial arteries. The globes and orbits are intact. The ventricles are proportionate to the degree of atrophy. No significant extraaxial fluid collection is present. Minimal mucosal thickening is present posteriorly in the right maxillary sinus. The remaining paranasal sinuses and the mastoid air cells are  clear.  Midline structures are within normal limits. The skullbase is unremarkable.  MRA HEAD FINDINGS  The study is mildly degraded by patient motion.  The internal carotid arteries are within normal limits from the high cervical segments through the ICA termini bilaterally. The A1 segments are normal. Mild irregularity is present in the distal left M1 segment, without a significant stenosis. There is a focal moderate to high-grade stenosis at the origin of the posterior-inferior right M2 segment. There is moderate attenuation of MCA branch vessels bilaterally. Branch vessels are more highly attenuated on the right.  The left vertebral artery is the dominant vessel. The left PICA origin is visualized. The vessel is small. A right AICA is noted. The basilar artery is within normal limits. The both posterior cerebral arteries originate from the basilar tip. A high-grade stenosis is present within the proximal left P2 segment. There is mild distal segmental narrowing bilaterally, worse on the left.  IMPRESSION: 1. Acute nonhemorrhagic 21 mm white matter infarct of the posterior right frontal lobe. 2. Extensive atrophy and diffuse white matter disease corresponds to advanced chronic microvascular ischemia. 3. Moderate to high-grade stenosis of the infra posterior right M2 branch. 4. Moderate small vessel disease is evident on the MRA. 5. High-grade stenosis in the proximal left P2 segment.   Electronically Signed   By: San Morelle M.D.   On: 07/03/2014 21:04   Mr Brain Wo Contrast  07/03/2014   CLINICAL DATA:  Headache and slurred speech for several days. Aphasia.  EXAM: MRI HEAD WITHOUT CONTRAST  MRA HEAD WITHOUT CONTRAST  TECHNIQUE: Multiplanar, multiecho pulse sequences of the brain and surrounding structures were obtained without intravenous contrast. Angiographic images of the head were obtained using MRA technique without contrast.  COMPARISON:  CT head without contrast 07/03/2014. MRI brain 04/15/2006   FINDINGS: MRI HEAD FINDINGS  A 21 x 5 mm white matter infarct is present in the posterior right frontal lobe. No other areas of restricted diffusion are evident. T2 changes are associated with the acute infarct. Moderate generalized atrophy and diffuse white matter changes are evident bilaterally. There are remote lacunar infarcts within the basal ganglia bilaterally. White matter changes extend into the brainstem.  Flow is present in the major intracranial arteries. The globes and orbits are intact. The ventricles are proportionate to the degree of atrophy. No significant extraaxial fluid collection is present. Minimal mucosal thickening is present posteriorly in the right maxillary sinus. The remaining paranasal sinuses and the mastoid air cells are clear.  Midline structures are within normal limits. The skullbase is unremarkable.  MRA HEAD FINDINGS  The study is mildly degraded  by patient motion.  The internal carotid arteries are within normal limits from the high cervical segments through the ICA termini bilaterally. The A1 segments are normal. Mild irregularity is present in the distal left M1 segment, without a significant stenosis. There is a focal moderate to high-grade stenosis at the origin of the posterior-inferior right M2 segment. There is moderate attenuation of MCA branch vessels bilaterally. Branch vessels are more highly attenuated on the right.  The left vertebral artery is the dominant vessel. The left PICA origin is visualized. The vessel is small. A right AICA is noted. The basilar artery is within normal limits. The both posterior cerebral arteries originate from the basilar tip. A high-grade stenosis is present within the proximal left P2 segment. There is mild distal segmental narrowing bilaterally, worse on the left.  IMPRESSION: 1. Acute nonhemorrhagic 21 mm white matter infarct of the posterior right frontal lobe. 2. Extensive atrophy and diffuse white matter disease corresponds to  advanced chronic microvascular ischemia. 3. Moderate to high-grade stenosis of the infra posterior right M2 branch. 4. Moderate small vessel disease is evident on the MRA. 5. High-grade stenosis in the proximal left P2 segment.   Electronically Signed   By: San Morelle M.D.   On: 07/03/2014 21:04    Micro Results      Recent Results (from the past 240 hour(s))  Urine culture     Status: None   Collection Time: 07/03/14  6:37 PM  Result Value Ref Range Status   Specimen Description URINE, CLEAN CATCH  Final   Special Requests Normal  Final   Colony Count   Final    50,000 COLONIES/ML Performed at [facility]    Culture   Final    Multiple bacterial morphotypes present, none predominant. Suggest appropriate recollection if clinically indicated. Performed at [facility]    Report Status 07/05/2014 FINAL  Final       Today   Subjective:   Eilee Schader today has no headache,no chest abdominal pain,no new weakness tingling or numbness, feels much better wants to go home today.    Objective:   Blood pressure 132/49, pulse 85, temperature 97.6 F (36.4 C), temperature source Oral, resp. rate 17, height 5\' 2"  (1.575 m), weight 72.53 kg (159 lb 14.4 oz), SpO2 97 %.  No intake or output data in the 24 hours ending 07/05/14 0940  Exam Awake Alert, Oriented x 3, No new F.N deficits, Normal affect [place].AT,PERRAL Supple Neck,No JVD, No cervical lymphadenopathy appriciated.  Symmetrical Chest wall movement, Good air movement bilaterally, CTAB RRR,No Gallops,Rubs or new Murmurs, No Parasternal Heave +ve B.Sounds, Abd Soft, Non tender, No organomegaly appriciated, No rebound -guarding or rigidity. No Cyanosis, Clubbing or edema, No new Rash or bruise  Data Review   CBC w Diff:  Lab Results  Component Value Date   WBC 5.1 07/03/2014   HGB 12.9 07/03/2014   HCT 39.2 07/03/2014   PLT 380 07/03/2014   LYMPHOPCT 30.0 10/05/2013   MONOPCT 9.0 10/05/2013    EOSPCT 1.7 10/05/2013   BASOPCT 0.3 10/05/2013    CMP:  Lab Results  Component Value Date   NA 130* 07/05/2014   K 4.3 07/05/2014   CL 98 07/05/2014   CO2 27 07/05/2014   BUN 10 07/05/2014   CREATININE 0.77 07/05/2014   CREATININE 0.77 01/16/2013   PROT 7.1 06/28/2012   ALBUMIN 4.0 06/28/2012   BILITOT 0.4 06/28/2012   ALKPHOS 53 06/28/2012   AST 9 06/28/2012  ALT 13 06/28/2012  . Lab Results  Component Value Date   HGBA1C 5.9 01/16/2013    Lab Results  Component Value Date   CHOL 187 07/05/2014   HDL 74 07/05/2014   LDLCALC 98 07/05/2014   LDLDIRECT 101.1 10/21/2010   TRIG 74 07/05/2014   CHOLHDL 2.5 07/05/2014     Total Time in preparing paper work, data evaluation and todays exam - 35 minutes  Thurnell Lose M.D on 07/05/2014 at 9:40 AM  [facility]   Office  [phone]

## 2014-07-05 NOTE — Progress Notes (Signed)
UR complete.  Niajah Sipos RN, MSN 

## 2014-07-05 NOTE — Care Management Note (Signed)
    Page 1 of 2   07/05/2014     10:52:32 AM CARE MANAGEMENT NOTE 07/05/2014  Patient:  Sarah [name], Sarah [name]   Account Number:  [id number]  Date Initiated:  07/05/2014  Documentation initiated by:  Lorne Skeens  Subjective/Objective Assessment:   Patient was admitted with CVA. Lives at home alone. Has a caregiver with her 7 days a week from 1000-1400.     Action/Plan:   Will follow for discharge needs pending PT/OT recommendations and physician orders.   Anticipated DC Date:  07/05/2014   Anticipated DC Plan:  [place]  CM consult      Choice offered to / List presented to:  C-1 Patient        [place] arranged  [place]   Status of service:  Completed, signed off Medicare Important Message given?  NO (If response is "NO", the following Medicare IM given date fields will be blank) Date Medicare IM given:   Medicare IM given by:   Date Additional Medicare IM given:   Additional Medicare IM given by:    Discharge Disposition:  [place]  Per UR Regulation:  Reviewed for med. necessity/level of care/duration of stay  If discussed at [place] of Stay Meetings, dates discussed:    Comments:  07/05/14 [place], MSN, CM- Met with patient and daughter to discuss home health services. Patient has chosen [facility], who she has been with in the past.  She is requesting that her services be provided by her previous therapists, [facility] and [place].  Patient's daughter also had multiple questions regarding adaptive equipment/handrails in the home.  CM was unable to make recommendations, but spoke with acute care PT regarding these questions.  PT suggested that the home health PT make recommendations based on the specific set-up of patient's home.  Patient and daughter are agreeable to this plan. Daughter intends to stay with patient for the first several  days following dishcharge.  Mary with [facility] was contacted and has accepted the referral for discharge home today.

## 2014-07-05 NOTE — Telephone Encounter (Signed)
Transition Care Management Follow-up Telephone Call D/C 07/05/14  How have you been since you were released from the hospital? Spoke daughter she stated they just got home from the hospital mom is doing ok   Do you understand why you were in the hospital? YES   Do you understand the discharge instrcutions? YES  Items Reviewed:  Medications reviewed: YES  Allergies reviewed: YES  Dietary changes reviewed: NO  Referrals reviewed: No referral needed   Functional Questionnaire:   Activities of Daily Living (ADLs):   She states she are independent in the following: ambulation, bathing and hygiene, feeding, continence, grooming, toileting and dressing She States she doesn't  require assistance but she is there to assist her   Any transportation issues/concerns?: NO   Any patient concerns? NO   Confirmed importance and date/time of follow-up visits scheduled: YES, made appt for 07/09/14 w/Dr. Linna Darner   Confirmed with patient if condition begins to worsen call PCP or go to the ER.

## 2014-07-05 NOTE — Discharge Instructions (Signed)
Follow with Primary MD Gwendolyn Grant, MD in 7 days   Get CBC, CMP, 2 view Chest X ray checked  by Primary MD next visit.    Activity: As tolerated with Full fall precautions use walker/cane & assistance as needed   Disposition Home     Diet: Heart Healthy    For Heart failure patients - Check your Weight same time everyday, if you gain over 2 pounds, or you develop in leg swelling, experience more shortness of breath or chest pain, call your Primary MD immediately. Follow Cardiac Low Salt Diet and 1.5 lit/day fluid restriction.   On your next visit with your primary care physician please Get Medicines reviewed and adjusted.   Please request your Prim.MD to go over all Hospital Tests and Procedure/Radiological results at the follow up, please get all Hospital records sent to your Prim MD by signing hospital release before you go home.   If you experience worsening of your admission symptoms, develop shortness of breath, life threatening emergency, suicidal or homicidal thoughts you must seek medical attention immediately by calling 911 or calling your MD immediately  if symptoms less severe.  You Must read complete instructions/literature along with all the possible adverse reactions/side effects for all the Medicines you take and that have been prescribed to you. Take any new Medicines after you have completely understood and accpet all the possible adverse reactions/side effects.   Do not drive, operating heavy machinery, perform activities at heights, swimming or participation in water activities or provide baby sitting services if your were admitted for syncope or siezures until you have seen by Primary MD or a Neurologist and advised to do so again.  Do not drive when taking Pain medications.    Do not take more than prescribed Pain, Sleep and Anxiety Medications  Special Instructions: If you have smoked or chewed Tobacco  in the last 2 yrs please stop smoking, stop any  regular Alcohol  and or any Recreational drug use.  Wear Seat belts while driving.   Please note  You were cared for by a hospitalist during your hospital stay. If you have any questions about your discharge medications or the care you received while you were in the hospital after you are discharged, you can call the unit and asked to speak with the hospitalist on call if the hospitalist that took care of you is not available. Once you are discharged, your primary care physician will handle any further medical issues. Please note that NO REFILLS for any discharge medications will be authorized once you are discharged, as it is imperative that you return to your primary care physician (or establish a relationship with a primary care physician if you do not have one) for your aftercare needs so that they can reassess your need for medications and monitor your lab values.

## 2014-07-06 DIAGNOSIS — F419 Anxiety disorder, unspecified: Secondary | ICD-10-CM | POA: Diagnosis not present

## 2014-07-06 DIAGNOSIS — Z9181 History of falling: Secondary | ICD-10-CM | POA: Diagnosis not present

## 2014-07-06 DIAGNOSIS — I69320 Aphasia following cerebral infarction: Secondary | ICD-10-CM | POA: Diagnosis not present

## 2014-07-06 DIAGNOSIS — R279 Unspecified lack of coordination: Secondary | ICD-10-CM | POA: Diagnosis not present

## 2014-07-06 DIAGNOSIS — F329 Major depressive disorder, single episode, unspecified: Secondary | ICD-10-CM | POA: Diagnosis not present

## 2014-07-06 DIAGNOSIS — M6281 Muscle weakness (generalized): Secondary | ICD-10-CM | POA: Diagnosis not present

## 2014-07-06 DIAGNOSIS — M47897 Other spondylosis, lumbosacral region: Secondary | ICD-10-CM | POA: Diagnosis not present

## 2014-07-06 DIAGNOSIS — Z87440 Personal history of urinary (tract) infections: Secondary | ICD-10-CM | POA: Diagnosis not present

## 2014-07-06 DIAGNOSIS — I10 Essential (primary) hypertension: Secondary | ICD-10-CM | POA: Diagnosis not present

## 2014-07-09 ENCOUNTER — Ambulatory Visit (INDEPENDENT_AMBULATORY_CARE_PROVIDER_SITE_OTHER): Payer: Medicare Other | Admitting: Internal Medicine

## 2014-07-09 ENCOUNTER — Encounter: Payer: Self-pay | Admitting: Internal Medicine

## 2014-07-09 ENCOUNTER — Other Ambulatory Visit (INDEPENDENT_AMBULATORY_CARE_PROVIDER_SITE_OTHER): Payer: Medicare Other

## 2014-07-09 VITALS — BP 100/70 | HR 61 | Temp 97.7°F | Ht 62.0 in | Wt 160.2 lb

## 2014-07-09 DIAGNOSIS — E871 Hypo-osmolality and hyponatremia: Secondary | ICD-10-CM

## 2014-07-09 DIAGNOSIS — R739 Hyperglycemia, unspecified: Secondary | ICD-10-CM | POA: Diagnosis not present

## 2014-07-09 DIAGNOSIS — E538 Deficiency of other specified B group vitamins: Secondary | ICD-10-CM

## 2014-07-09 DIAGNOSIS — F329 Major depressive disorder, single episode, unspecified: Secondary | ICD-10-CM | POA: Diagnosis not present

## 2014-07-09 DIAGNOSIS — I69320 Aphasia following cerebral infarction: Secondary | ICD-10-CM | POA: Diagnosis not present

## 2014-07-09 DIAGNOSIS — M6281 Muscle weakness (generalized): Secondary | ICD-10-CM | POA: Diagnosis not present

## 2014-07-09 DIAGNOSIS — I10 Essential (primary) hypertension: Secondary | ICD-10-CM | POA: Diagnosis not present

## 2014-07-09 DIAGNOSIS — I63511 Cerebral infarction due to unspecified occlusion or stenosis of right middle cerebral artery: Secondary | ICD-10-CM

## 2014-07-09 DIAGNOSIS — E7800 Pure hypercholesterolemia, unspecified: Secondary | ICD-10-CM

## 2014-07-09 DIAGNOSIS — R279 Unspecified lack of coordination: Secondary | ICD-10-CM | POA: Diagnosis not present

## 2014-07-09 DIAGNOSIS — M47897 Other spondylosis, lumbosacral region: Secondary | ICD-10-CM | POA: Diagnosis not present

## 2014-07-09 LAB — BASIC METABOLIC PANEL
BUN: 16 mg/dL (ref 6–23)
CALCIUM: 9.4 mg/dL (ref 8.4–10.5)
CO2: 28 mEq/L (ref 19–32)
Chloride: 95 mEq/L — ABNORMAL LOW (ref 96–112)
Creatinine, Ser: 0.64 mg/dL (ref 0.40–1.20)
GFR: 93.55 mL/min (ref >60.00)
Glucose, Bld: 85 mg/dL (ref 70–99)
Potassium: 4.4 mEq/L (ref 3.5–5.1)
SODIUM: 126 meq/L — AB (ref 135–145)

## 2014-07-09 LAB — CK: CK TOTAL: 36 U/L (ref 7–177)

## 2014-07-09 LAB — VITAMIN B12: Vitamin B-12: 374 pg/mL (ref 211–911)

## 2014-07-09 LAB — HEMOGLOBIN A1C: Hgb A1c MFr Bld: 5.8 % (ref 4.6–6.5)

## 2014-07-09 MED ORDER — PRAVASTATIN SODIUM 20 MG PO TABS: ORAL_TABLET | ORAL | Status: DC

## 2014-07-09 NOTE — Assessment & Plan Note (Signed)
A1c

## 2014-07-09 NOTE — Patient Instructions (Signed)
  Your next office appointment will be determined based upon review of your pending labs.  Those instructions will be transmitted to you by My Chart    Critical results will be called.   Followup as needed for any active or acute issue. Please report any significant change in your symptoms. 

## 2014-07-09 NOTE — Progress Notes (Signed)
Pre visit review using our clinic review tool, if applicable. No additional management support is needed unless otherwise documented below in the visit note. 

## 2014-07-09 NOTE — Assessment & Plan Note (Signed)
B12 level 

## 2014-07-09 NOTE — Assessment & Plan Note (Signed)
  Livalo would require pre authorization Pravastatin 20 mg 1/2 qd with lipids after 10 weeks CK now & after 10 weeks

## 2014-07-09 NOTE — Progress Notes (Signed)
   Subjective:    Patient ID: Sarah [name], female    DOB: Jan 14, 1927, 81 y.o.   MRN: [id number]  HPI  The hospital records 4/6-07/05/14 were reviewed. She was admitted because of an acute ischemic stroke of the posterior right frontal lobe manifested as slurred speech and headache. She was seen in consult by Dr. Leonie Man, neurologist. Her aspirin was changed to Plavix.  There was rapid resolution of her symptoms and she was discharged with physical therapy, occupational therapy, and home health nursing.  Random glucose was 107; follow-up of possible diabetes was recommended. Last A1c on record was 5.9 in 12/2012. On 4/8 sodium was 130.   Because of LDL of 98 Livalo was initiated. She has a history of myalgias with atorvastatin and lovastatin.Tis would require pre authorization.   She is requesting monthly  administration B12 in the home environment. The last B12 level on record was over 1500 in 09/2013.  Review of Systems   She does have intermittent lateral sharp chest pain at rest. This has been evaluated with d-dimer.  She intermittently has some cramping of her hands.     Objective:   Physical Exam Pertinent or positive findings include: Mask facies suggested slightly.  OA DIP changes noted in isolated distribution.  Pedal pulses decreased except for the right dorsalis pedis pulse  General appearance :adequately nourished; in no distress. Eyes: No conjunctival inflammation or scleral icterus is present. Oral exam:  Lips and gums are healthy appearing.There is no oropharyngeal erythema or exudate noted. Dental hygiene is good. Heart:  Normal rate and regular rhythm. S1 and S2 normal without gallop, murmur, click, rub or other extra sounds   Lungs:Chest clear to auscultation; no wheezes, rhonchi,rales ,or rubs present.No increased work of breathing.  Abdomen: bowel sounds normal, soft and non-tender without masses, organomegaly or hernias noted.  No guarding or rebound.  Vascular : no  bruits present. Skin:Warm & dry.  Intact without suspicious lesions or rashes ; no tenting or jaundice  Lymphatic: No lymphadenopathy is noted about the head, neck, axilla Neuro: Strength, tone normal.         Assessment & Plan:  See Current Assessment & Plan in Problem List under specific Diagnosis

## 2014-07-09 NOTE — Assessment & Plan Note (Signed)
BMET 

## 2014-07-09 NOTE — Assessment & Plan Note (Signed)
Baseline CK prior to starting very low dose pravastatin 10 mg daily. NMR LipoProfile after 10 weeks of this medication.

## 2014-07-10 ENCOUNTER — Telehealth: Payer: Self-pay | Admitting: Internal Medicine

## 2014-07-10 ENCOUNTER — Encounter: Payer: Self-pay | Admitting: Internal Medicine

## 2014-07-10 DIAGNOSIS — R279 Unspecified lack of coordination: Secondary | ICD-10-CM | POA: Diagnosis not present

## 2014-07-10 DIAGNOSIS — M47897 Other spondylosis, lumbosacral region: Secondary | ICD-10-CM | POA: Diagnosis not present

## 2014-07-10 DIAGNOSIS — I10 Essential (primary) hypertension: Secondary | ICD-10-CM | POA: Diagnosis not present

## 2014-07-10 DIAGNOSIS — F329 Major depressive disorder, single episode, unspecified: Secondary | ICD-10-CM | POA: Diagnosis not present

## 2014-07-10 DIAGNOSIS — I69320 Aphasia following cerebral infarction: Secondary | ICD-10-CM | POA: Diagnosis not present

## 2014-07-10 DIAGNOSIS — M6281 Muscle weakness (generalized): Secondary | ICD-10-CM | POA: Diagnosis not present

## 2014-07-10 NOTE — Telephone Encounter (Signed)
Nicki Reaper has been advised orders are ok

## 2014-07-10 NOTE — Telephone Encounter (Signed)
OK ;S/P CVA

## 2014-07-10 NOTE — Telephone Encounter (Signed)
Needs approval occupational therapy 2x week for 5 weeks.

## 2014-07-12 ENCOUNTER — Encounter: Payer: Self-pay | Admitting: Internal Medicine

## 2014-07-12 ENCOUNTER — Telehealth: Payer: Self-pay | Admitting: Internal Medicine

## 2014-07-12 ENCOUNTER — Other Ambulatory Visit: Payer: Self-pay | Admitting: Internal Medicine

## 2014-07-12 DIAGNOSIS — I10 Essential (primary) hypertension: Secondary | ICD-10-CM | POA: Diagnosis not present

## 2014-07-12 DIAGNOSIS — R279 Unspecified lack of coordination: Secondary | ICD-10-CM | POA: Diagnosis not present

## 2014-07-12 DIAGNOSIS — I69320 Aphasia following cerebral infarction: Secondary | ICD-10-CM | POA: Diagnosis not present

## 2014-07-12 DIAGNOSIS — M47897 Other spondylosis, lumbosacral region: Secondary | ICD-10-CM | POA: Diagnosis not present

## 2014-07-12 DIAGNOSIS — F329 Major depressive disorder, single episode, unspecified: Secondary | ICD-10-CM | POA: Diagnosis not present

## 2014-07-12 DIAGNOSIS — M6281 Muscle weakness (generalized): Secondary | ICD-10-CM | POA: Diagnosis not present

## 2014-07-12 NOTE — Telephone Encounter (Signed)
Called Sarah [name] (at number below).   Speech evaluation will be done.   PT verbal okay given for 2x week for 5 weeks.

## 2014-07-12 NOTE — Telephone Encounter (Signed)
[facility] is needing verbal order for speech therapy and Approval to continue physical therapy

## 2014-07-15 DIAGNOSIS — R279 Unspecified lack of coordination: Secondary | ICD-10-CM | POA: Diagnosis not present

## 2014-07-15 DIAGNOSIS — F329 Major depressive disorder, single episode, unspecified: Secondary | ICD-10-CM | POA: Diagnosis not present

## 2014-07-15 DIAGNOSIS — I10 Essential (primary) hypertension: Secondary | ICD-10-CM | POA: Diagnosis not present

## 2014-07-15 DIAGNOSIS — M6281 Muscle weakness (generalized): Secondary | ICD-10-CM | POA: Diagnosis not present

## 2014-07-15 DIAGNOSIS — I69320 Aphasia following cerebral infarction: Secondary | ICD-10-CM | POA: Diagnosis not present

## 2014-07-15 DIAGNOSIS — M47897 Other spondylosis, lumbosacral region: Secondary | ICD-10-CM | POA: Diagnosis not present

## 2014-07-15 NOTE — Telephone Encounter (Signed)
Agree, VO as given thanks

## 2014-07-16 DIAGNOSIS — I10 Essential (primary) hypertension: Secondary | ICD-10-CM | POA: Diagnosis not present

## 2014-07-16 DIAGNOSIS — I69320 Aphasia following cerebral infarction: Secondary | ICD-10-CM | POA: Diagnosis not present

## 2014-07-16 DIAGNOSIS — M6281 Muscle weakness (generalized): Secondary | ICD-10-CM | POA: Diagnosis not present

## 2014-07-16 DIAGNOSIS — F329 Major depressive disorder, single episode, unspecified: Secondary | ICD-10-CM | POA: Diagnosis not present

## 2014-07-16 DIAGNOSIS — M47897 Other spondylosis, lumbosacral region: Secondary | ICD-10-CM | POA: Diagnosis not present

## 2014-07-16 DIAGNOSIS — R279 Unspecified lack of coordination: Secondary | ICD-10-CM | POA: Diagnosis not present

## 2014-07-17 DIAGNOSIS — M6281 Muscle weakness (generalized): Secondary | ICD-10-CM | POA: Diagnosis not present

## 2014-07-17 DIAGNOSIS — F329 Major depressive disorder, single episode, unspecified: Secondary | ICD-10-CM | POA: Diagnosis not present

## 2014-07-17 DIAGNOSIS — I10 Essential (primary) hypertension: Secondary | ICD-10-CM | POA: Diagnosis not present

## 2014-07-17 DIAGNOSIS — I69320 Aphasia following cerebral infarction: Secondary | ICD-10-CM | POA: Diagnosis not present

## 2014-07-17 DIAGNOSIS — M47897 Other spondylosis, lumbosacral region: Secondary | ICD-10-CM | POA: Diagnosis not present

## 2014-07-17 DIAGNOSIS — R279 Unspecified lack of coordination: Secondary | ICD-10-CM | POA: Diagnosis not present

## 2014-07-18 ENCOUNTER — Telehealth: Payer: Self-pay | Admitting: Internal Medicine

## 2014-07-18 DIAGNOSIS — E538 Deficiency of other specified B group vitamins: Secondary | ICD-10-CM

## 2014-07-18 NOTE — Telephone Encounter (Signed)
Daughter called in and wanted to know if they can give pt more liquid.  She is saying she is so thirsty all the time     Best number [phone]

## 2014-07-18 NOTE — Telephone Encounter (Signed)
Called [facility] and they needed the dx code for the b12 med.

## 2014-07-18 NOTE — Telephone Encounter (Signed)
Spoke to Dr. Doug Sou.   After reviewing prior and recent labs she fills that 34 oz of fluid per day is reasonable and should seek urgent  Medical attention if weakness worseness.

## 2014-07-18 NOTE — Telephone Encounter (Signed)
Called Estill Bamberg at [facility]. Verbal okay given: to inject 1000 mcg/ml in the home every 30 day per PCP

## 2014-07-18 NOTE — Telephone Encounter (Signed)
Estill Bamberg called back in and is needing some more info.  She was asking if you could call her again

## 2014-07-18 NOTE — Telephone Encounter (Signed)
E53.8 as on prob list "B12 deficiency" thanks

## 2014-07-18 NOTE — Telephone Encounter (Signed)
[facility] [phone]  Need verbal order to give pt b12 injection in the home

## 2014-07-19 DIAGNOSIS — I69320 Aphasia following cerebral infarction: Secondary | ICD-10-CM | POA: Diagnosis not present

## 2014-07-19 DIAGNOSIS — M47897 Other spondylosis, lumbosacral region: Secondary | ICD-10-CM | POA: Diagnosis not present

## 2014-07-19 DIAGNOSIS — I10 Essential (primary) hypertension: Secondary | ICD-10-CM | POA: Diagnosis not present

## 2014-07-19 DIAGNOSIS — R279 Unspecified lack of coordination: Secondary | ICD-10-CM | POA: Diagnosis not present

## 2014-07-19 DIAGNOSIS — F329 Major depressive disorder, single episode, unspecified: Secondary | ICD-10-CM | POA: Diagnosis not present

## 2014-07-19 DIAGNOSIS — M6281 Muscle weakness (generalized): Secondary | ICD-10-CM | POA: Diagnosis not present

## 2014-07-19 NOTE — Telephone Encounter (Signed)
Notified amanda with md response...Sarah [name]

## 2014-07-22 DIAGNOSIS — F329 Major depressive disorder, single episode, unspecified: Secondary | ICD-10-CM | POA: Diagnosis not present

## 2014-07-22 DIAGNOSIS — M47897 Other spondylosis, lumbosacral region: Secondary | ICD-10-CM | POA: Diagnosis not present

## 2014-07-22 DIAGNOSIS — I69320 Aphasia following cerebral infarction: Secondary | ICD-10-CM | POA: Diagnosis not present

## 2014-07-22 DIAGNOSIS — I10 Essential (primary) hypertension: Secondary | ICD-10-CM | POA: Diagnosis not present

## 2014-07-22 DIAGNOSIS — M6281 Muscle weakness (generalized): Secondary | ICD-10-CM | POA: Diagnosis not present

## 2014-07-22 DIAGNOSIS — R279 Unspecified lack of coordination: Secondary | ICD-10-CM | POA: Diagnosis not present

## 2014-07-23 ENCOUNTER — Other Ambulatory Visit: Payer: Self-pay | Admitting: Internal Medicine

## 2014-07-23 DIAGNOSIS — M6281 Muscle weakness (generalized): Secondary | ICD-10-CM | POA: Diagnosis not present

## 2014-07-23 DIAGNOSIS — I10 Essential (primary) hypertension: Secondary | ICD-10-CM | POA: Diagnosis not present

## 2014-07-23 DIAGNOSIS — M47897 Other spondylosis, lumbosacral region: Secondary | ICD-10-CM | POA: Diagnosis not present

## 2014-07-23 DIAGNOSIS — I69320 Aphasia following cerebral infarction: Secondary | ICD-10-CM | POA: Diagnosis not present

## 2014-07-23 DIAGNOSIS — F329 Major depressive disorder, single episode, unspecified: Secondary | ICD-10-CM | POA: Diagnosis not present

## 2014-07-23 DIAGNOSIS — R279 Unspecified lack of coordination: Secondary | ICD-10-CM | POA: Diagnosis not present

## 2014-07-24 DIAGNOSIS — I10 Essential (primary) hypertension: Secondary | ICD-10-CM | POA: Diagnosis not present

## 2014-07-24 DIAGNOSIS — F329 Major depressive disorder, single episode, unspecified: Secondary | ICD-10-CM | POA: Diagnosis not present

## 2014-07-24 DIAGNOSIS — M6281 Muscle weakness (generalized): Secondary | ICD-10-CM | POA: Diagnosis not present

## 2014-07-24 DIAGNOSIS — R279 Unspecified lack of coordination: Secondary | ICD-10-CM | POA: Diagnosis not present

## 2014-07-24 DIAGNOSIS — M47897 Other spondylosis, lumbosacral region: Secondary | ICD-10-CM | POA: Diagnosis not present

## 2014-07-24 DIAGNOSIS — I69320 Aphasia following cerebral infarction: Secondary | ICD-10-CM | POA: Diagnosis not present

## 2014-07-26 DIAGNOSIS — M47897 Other spondylosis, lumbosacral region: Secondary | ICD-10-CM | POA: Diagnosis not present

## 2014-07-26 DIAGNOSIS — F329 Major depressive disorder, single episode, unspecified: Secondary | ICD-10-CM | POA: Diagnosis not present

## 2014-07-26 DIAGNOSIS — R279 Unspecified lack of coordination: Secondary | ICD-10-CM | POA: Diagnosis not present

## 2014-07-26 DIAGNOSIS — M6281 Muscle weakness (generalized): Secondary | ICD-10-CM | POA: Diagnosis not present

## 2014-07-26 DIAGNOSIS — I69320 Aphasia following cerebral infarction: Secondary | ICD-10-CM | POA: Diagnosis not present

## 2014-07-26 DIAGNOSIS — I10 Essential (primary) hypertension: Secondary | ICD-10-CM | POA: Diagnosis not present

## 2014-07-29 DIAGNOSIS — M47897 Other spondylosis, lumbosacral region: Secondary | ICD-10-CM | POA: Diagnosis not present

## 2014-07-29 DIAGNOSIS — F329 Major depressive disorder, single episode, unspecified: Secondary | ICD-10-CM | POA: Diagnosis not present

## 2014-07-29 DIAGNOSIS — I69320 Aphasia following cerebral infarction: Secondary | ICD-10-CM | POA: Diagnosis not present

## 2014-07-29 DIAGNOSIS — M6281 Muscle weakness (generalized): Secondary | ICD-10-CM | POA: Diagnosis not present

## 2014-07-29 DIAGNOSIS — I10 Essential (primary) hypertension: Secondary | ICD-10-CM | POA: Diagnosis not present

## 2014-07-29 DIAGNOSIS — R279 Unspecified lack of coordination: Secondary | ICD-10-CM | POA: Diagnosis not present

## 2014-07-29 NOTE — Telephone Encounter (Signed)
How long would you like this patient to be on the fluid restricted diet?

## 2014-07-29 NOTE — Telephone Encounter (Signed)
Care giver Daun Peacock)   called and wants to know how long pt would be on restricked liquid?    Best number [phone]

## 2014-07-29 NOTE — Telephone Encounter (Signed)
She should have fasting labs  After 4/22repeated  ; orders entered

## 2014-07-30 DIAGNOSIS — I10 Essential (primary) hypertension: Secondary | ICD-10-CM | POA: Diagnosis not present

## 2014-07-30 DIAGNOSIS — M6281 Muscle weakness (generalized): Secondary | ICD-10-CM | POA: Diagnosis not present

## 2014-07-30 DIAGNOSIS — I69320 Aphasia following cerebral infarction: Secondary | ICD-10-CM | POA: Diagnosis not present

## 2014-07-30 DIAGNOSIS — M47897 Other spondylosis, lumbosacral region: Secondary | ICD-10-CM | POA: Diagnosis not present

## 2014-07-30 DIAGNOSIS — R279 Unspecified lack of coordination: Secondary | ICD-10-CM | POA: Diagnosis not present

## 2014-07-30 DIAGNOSIS — F329 Major depressive disorder, single episode, unspecified: Secondary | ICD-10-CM | POA: Diagnosis not present

## 2014-07-30 NOTE — Telephone Encounter (Signed)
Left message on voicemail of fasting labs needed

## 2014-07-30 NOTE — Telephone Encounter (Signed)
Per Dr. Linna Darner (who saw the patient) she needs to come in for fasting labs.

## 2014-07-31 DIAGNOSIS — R279 Unspecified lack of coordination: Secondary | ICD-10-CM | POA: Diagnosis not present

## 2014-07-31 DIAGNOSIS — M6281 Muscle weakness (generalized): Secondary | ICD-10-CM | POA: Diagnosis not present

## 2014-07-31 DIAGNOSIS — M47897 Other spondylosis, lumbosacral region: Secondary | ICD-10-CM | POA: Diagnosis not present

## 2014-07-31 DIAGNOSIS — I10 Essential (primary) hypertension: Secondary | ICD-10-CM | POA: Diagnosis not present

## 2014-07-31 DIAGNOSIS — F329 Major depressive disorder, single episode, unspecified: Secondary | ICD-10-CM | POA: Diagnosis not present

## 2014-07-31 DIAGNOSIS — I69320 Aphasia following cerebral infarction: Secondary | ICD-10-CM | POA: Diagnosis not present

## 2014-08-02 ENCOUNTER — Other Ambulatory Visit: Payer: Self-pay | Admitting: Cardiology

## 2014-08-02 DIAGNOSIS — I639 Cerebral infarction, unspecified: Secondary | ICD-10-CM

## 2014-08-02 NOTE — Telephone Encounter (Signed)
Patient was started on this medication by an internal medicine hospital physician. Ok to refill? Please advise. Thanks, MI

## 2014-08-05 DIAGNOSIS — I10 Essential (primary) hypertension: Secondary | ICD-10-CM | POA: Diagnosis not present

## 2014-08-05 DIAGNOSIS — R279 Unspecified lack of coordination: Secondary | ICD-10-CM | POA: Diagnosis not present

## 2014-08-05 DIAGNOSIS — F329 Major depressive disorder, single episode, unspecified: Secondary | ICD-10-CM | POA: Diagnosis not present

## 2014-08-05 DIAGNOSIS — M47897 Other spondylosis, lumbosacral region: Secondary | ICD-10-CM | POA: Diagnosis not present

## 2014-08-05 DIAGNOSIS — I69320 Aphasia following cerebral infarction: Secondary | ICD-10-CM | POA: Diagnosis not present

## 2014-08-05 DIAGNOSIS — M6281 Muscle weakness (generalized): Secondary | ICD-10-CM | POA: Diagnosis not present

## 2014-08-06 ENCOUNTER — Other Ambulatory Visit (INDEPENDENT_AMBULATORY_CARE_PROVIDER_SITE_OTHER): Payer: Medicare Other

## 2014-08-06 DIAGNOSIS — E871 Hypo-osmolality and hyponatremia: Secondary | ICD-10-CM | POA: Diagnosis not present

## 2014-08-06 DIAGNOSIS — I10 Essential (primary) hypertension: Secondary | ICD-10-CM | POA: Diagnosis not present

## 2014-08-06 DIAGNOSIS — E7800 Pure hypercholesterolemia, unspecified: Secondary | ICD-10-CM

## 2014-08-06 DIAGNOSIS — F329 Major depressive disorder, single episode, unspecified: Secondary | ICD-10-CM | POA: Diagnosis not present

## 2014-08-06 DIAGNOSIS — I69320 Aphasia following cerebral infarction: Secondary | ICD-10-CM | POA: Diagnosis not present

## 2014-08-06 DIAGNOSIS — M6281 Muscle weakness (generalized): Secondary | ICD-10-CM | POA: Diagnosis not present

## 2014-08-06 DIAGNOSIS — M47897 Other spondylosis, lumbosacral region: Secondary | ICD-10-CM | POA: Diagnosis not present

## 2014-08-06 DIAGNOSIS — R279 Unspecified lack of coordination: Secondary | ICD-10-CM | POA: Diagnosis not present

## 2014-08-06 LAB — LIPID PANEL
CHOLESTEROL: 163 mg/dL (ref 0–200)
HDL: 68.7 mg/dL (ref >39.00)
LDL CALC: 77 mg/dL (ref 0–99)
NONHDL: 94.3
Total CHOL/HDL Ratio: 2
Triglycerides: 87 mg/dL (ref 0.0–149.0)
VLDL: 17.4 mg/dL (ref 0.0–40.0)

## 2014-08-06 LAB — HEPATIC FUNCTION PANEL
ALT: 13 U/L (ref 0–35)
AST: 8 U/L (ref 0–37)
Albumin: 3.7 g/dL (ref 3.5–5.2)
Alkaline Phosphatase: 50 U/L (ref 39–117)
BILIRUBIN TOTAL: 0.3 mg/dL (ref 0.2–1.2)
Bilirubin, Direct: 0.1 mg/dL (ref 0.0–0.3)
TOTAL PROTEIN: 6.7 g/dL (ref 6.0–8.3)

## 2014-08-06 LAB — BASIC METABOLIC PANEL
BUN: 14 mg/dL (ref 6–23)
CHLORIDE: 98 meq/L (ref 96–112)
CO2: 29 meq/L (ref 19–32)
CREATININE: 0.74 mg/dL (ref 0.40–1.20)
Calcium: 9.2 mg/dL (ref 8.4–10.5)
GFR: 79.1 mL/min (ref >60.00)
Glucose, Bld: 107 mg/dL — ABNORMAL HIGH (ref 70–99)
Potassium: 4.3 mEq/L (ref 3.5–5.1)
Sodium: 134 mEq/L — ABNORMAL LOW (ref 135–145)

## 2014-08-06 LAB — CK: Total CK: 42 U/L (ref 7–177)

## 2014-08-07 ENCOUNTER — Telehealth: Payer: Self-pay

## 2014-08-07 DIAGNOSIS — F329 Major depressive disorder, single episode, unspecified: Secondary | ICD-10-CM | POA: Diagnosis not present

## 2014-08-07 DIAGNOSIS — M6281 Muscle weakness (generalized): Secondary | ICD-10-CM | POA: Diagnosis not present

## 2014-08-07 DIAGNOSIS — I69320 Aphasia following cerebral infarction: Secondary | ICD-10-CM | POA: Diagnosis not present

## 2014-08-07 DIAGNOSIS — M47897 Other spondylosis, lumbosacral region: Secondary | ICD-10-CM | POA: Diagnosis not present

## 2014-08-07 DIAGNOSIS — I10 Essential (primary) hypertension: Secondary | ICD-10-CM | POA: Diagnosis not present

## 2014-08-07 DIAGNOSIS — R279 Unspecified lack of coordination: Secondary | ICD-10-CM | POA: Diagnosis not present

## 2014-08-07 NOTE — Telephone Encounter (Signed)
Patient is requesting lab results from yesterday.  Please advise.

## 2014-08-08 ENCOUNTER — Telehealth: Payer: Self-pay | Admitting: Internal Medicine

## 2014-08-08 NOTE — Telephone Encounter (Signed)
Pt called and said that she thought that her sodium was suppose to be going up.   She is wanting to know why it is going down?  What can be done to get it up?     Best number  [phone]

## 2014-08-08 NOTE — Telephone Encounter (Signed)
See labs 

## 2014-08-08 NOTE — Telephone Encounter (Signed)
Patient has been advised

## 2014-08-08 NOTE — Telephone Encounter (Signed)
Patient states her daughter checks it for her.   Patient has been advised of results. Is her fluid intake restricted to 24 oz still of juice, coffee etc? And 36 oz of water?

## 2014-08-08 NOTE — Telephone Encounter (Signed)
Left message to call back  

## 2014-08-08 NOTE — Telephone Encounter (Signed)
She can drink if thirsty but should measure daily volumes. If she is drinking > 48 oz of fluid/ day she'll need to have sodium monitored by Dr Asa Lente

## 2014-08-08 NOTE — Telephone Encounter (Signed)
Please verify they retrieved lab results through My Chart. If they are not going to utilize it ; please inactivate the account to prevent discontinuity of care. Thanks.

## 2014-08-09 ENCOUNTER — Ambulatory Visit (INDEPENDENT_AMBULATORY_CARE_PROVIDER_SITE_OTHER): Payer: Medicare Other | Admitting: Cardiology

## 2014-08-09 ENCOUNTER — Encounter: Payer: Self-pay | Admitting: Cardiology

## 2014-08-09 VITALS — BP 130/60 | HR 86 | Ht 62.0 in | Wt 160.4 lb

## 2014-08-09 DIAGNOSIS — M159 Polyosteoarthritis, unspecified: Secondary | ICD-10-CM

## 2014-08-09 DIAGNOSIS — I63511 Cerebral infarction due to unspecified occlusion or stenosis of right middle cerebral artery: Secondary | ICD-10-CM | POA: Diagnosis not present

## 2014-08-09 DIAGNOSIS — E7800 Pure hypercholesterolemia, unspecified: Secondary | ICD-10-CM

## 2014-08-09 DIAGNOSIS — M47897 Other spondylosis, lumbosacral region: Secondary | ICD-10-CM | POA: Diagnosis not present

## 2014-08-09 DIAGNOSIS — I63341 Cerebral infarction due to thrombosis of right cerebellar artery: Secondary | ICD-10-CM

## 2014-08-09 DIAGNOSIS — I119 Hypertensive heart disease without heart failure: Secondary | ICD-10-CM | POA: Diagnosis not present

## 2014-08-09 DIAGNOSIS — R279 Unspecified lack of coordination: Secondary | ICD-10-CM | POA: Diagnosis not present

## 2014-08-09 DIAGNOSIS — R079 Chest pain, unspecified: Secondary | ICD-10-CM

## 2014-08-09 DIAGNOSIS — M6281 Muscle weakness (generalized): Secondary | ICD-10-CM | POA: Diagnosis not present

## 2014-08-09 DIAGNOSIS — I10 Essential (primary) hypertension: Secondary | ICD-10-CM | POA: Diagnosis not present

## 2014-08-09 DIAGNOSIS — F329 Major depressive disorder, single episode, unspecified: Secondary | ICD-10-CM | POA: Diagnosis not present

## 2014-08-09 DIAGNOSIS — I69320 Aphasia following cerebral infarction: Secondary | ICD-10-CM | POA: Diagnosis not present

## 2014-08-09 DIAGNOSIS — M150 Primary generalized (osteo)arthritis: Secondary | ICD-10-CM

## 2014-08-09 MED ORDER — ACETAMINOPHEN 500 MG PO TABS: 500.0000 mg | ORAL_TABLET | Freq: Three times a day (TID) | ORAL | Status: AC | PRN

## 2014-08-09 NOTE — Progress Notes (Signed)
Cardiology Office Note   Date:  08/09/2014   ID:  Sarah [name], DOB [birth year], MRN [id number]  PCP:  Gwendolyn Grant, MD  Cardiologist: Darlin Coco MD  No chief complaint on file.     History of Present Illness: Sarah [name] is a 81 y.o. female who presents for followup office visit. This pleasant 81 year old widowed Caucasian female is seen for a 6 month followup office visit. She has a past history of atypical chest pain. She does not have any history of ischemic heart disease. She had a normal nuclear stress test in April 2012. She has a normal left ventricular function with ejection fraction of 88%. She does have a history of hypercholesterolemia but is intolerant of statins because of leg weakness and memory loss. Since last visit she is been stable from a cardiac standpoint. She continues to have occasional chest discomfort relieved by sublingual nitroglycerin. She has been diagnosed by her neurologist Dr. Posey Pronto with neuropathy. It affects her feet. She is no longer allowed to drive. She has a woman that comes to her home and drives her to appointments and does her shopping for her etc. The patient lives alone at night but seems to be managing okay. Had right brain stroke 3 weeks ago.  Now on plavix.  She has physical therapy and speech therapy and home health coming out to her home.  She also has a housekeeper to help her with meal preparation and housekeeping etc. she does not want to go to a retirement center if possible. Her right brain stroke cause some slurring of speech.  Her new medications are Plavix and she is no longer on aspirin and she is now on pravastatin in low dose.  Previously she could not tolerate Lipitor or lovastatin   Past Medical History  Diagnosis Date  . Osteoarthritis   . Bladder incontinence   . Constipation     hx of  . Hypertension   . Hyperlipidemia   . Chest pain     S/P nuclear 07/17/2008  normal; EF 88% with no ischemia  .  Chronic venous insufficiency     BLE edema, chronic, neg venous US 2/13  . History of chicken pox   . Depression   . Diverticulitis   . Polyp of colon   . Osteoporosis, postmenopausal 06/02/2011  . Esophageal stricture     s/p dilation 04/2012  . GERD (gastroesophageal reflux disease)   . Stroke 07/03/2014    Past Surgical History  Procedure Laterality Date  . Appendectomy      age 73  . Tubal ligation    . Polypectomy    . Colonoscopy    . Upper gastrointestinal endoscopy       Current Outpatient Prescriptions  Medication Sig Dispense Refill  . acetaminophen (TYLENOL) 500 MG tablet Take 500 mg by mouth every 6 (six) hours as needed (for pain).    Marland Kitchen ALPRAZolam (XANAX) 1 MG tablet Take 1 tablet (1 mg total) by mouth 2 (two) times daily. 60 tablet 5  . Cholecalciferol (VITAMIN D PO) Take 1 tablet by mouth daily.     . clobetasol ointment (TEMOVATE) 2.44 % Apply 1 application topically 2 (two) times daily.     . clopidogrel (PLAVIX) 75 MG tablet Take 75 mg by mouth daily.    . cyanocobalamin (,VITAMIN B-12,) 1000 MCG/ML injection INJECT 1 ML INTO THE MUSCLE EVERY 30 DAYS. 10 mL 5  . diclofenac sodium (VOLTAREN) 1 % GEL Apply  To feet 3 times daily prn for pain 1 Tube 5  . furosemide (LASIX) 20 MG tablet Take 20 mg by mouth daily as needed (for swelling).    . Glucosamine HCl 750 MG TABS Take 1 tablet by mouth 2 (two) times daily.     Marland Kitchen ibandronate (BONIVA) 150 MG tablet TAKE ONE TABLET ONCE A MONTH IN THE MORNING ON AN EMPTY STOMACH WITH WATER. REMAIN UPRIGHT. 1 tablet 5  . Misc Natural Products (TUMERSAID) TABS Take 1 capsule by mouth 2 (two) times daily. 60 tablet 3  . nitroGLYCERIN (NITROSTAT) 0.4 MG SL tablet Place 1 tablet (0.4 mg total) under the tongue every 5 (five) minutes as needed for chest pain. 100 tablet prn  . nortriptyline (PAMELOR) 25 MG capsule Take 25 mg by mouth at bedtime.    . pravastatin (PRAVACHOL) 20 MG tablet 1/2 qd 30 tablet 1  . pravastatin (PRAVACHOL) 20  MG tablet Take 10 mg by mouth daily.    . solifenacin (VESICARE) 10 MG tablet Take 10 mg by mouth daily as needed (to reduce number of bathroom visits during travel).    . triazolam (HALCION) 0.25 MG tablet Take 0.5-1 tablets (0.125-0.25 mg total) by mouth at bedtime as needed for sleep. 30 tablet 5   No current facility-administered medications for this visit.    Allergies:   Celebrex; Lipitor; Lovastatin; Other; and Latex    Social History:  The patient  reports that she has never smoked. She has never used smokeless tobacco. She reports that she does not drink alcohol or use illicit drugs.   Family History:  The patient's family history includes Breast cancer in her sister; Cancer in her son; Skin cancer in her sister. There is no history of Colon cancer, Esophageal cancer, Rectal cancer, or Stomach cancer.    ROS:  Please see the history of present illness.   Otherwise, review of systems are positive for none.   All other systems are reviewed and negative.    PHYSICAL EXAM: VS:  BP 130/60 mmHg  Pulse 86  Ht 5\' 2"  (1.575 m)  Wt 160 lb 6.4 oz (72.757 kg)  BMI 29.33 kg/m2 , BMI Body mass index is 29.33 kg/(m^2). GEN: Well nourished, well developed, in no acute distress HEENT: normal Neck: no JVD, carotid bruits, or masses Cardiac: RRR; no murmurs, rubs, or gallops,no edema  Respiratory:  clear to auscultation bilaterally, normal work of breathing GI: soft, nontender, nondistended, + BS MS: no deformity or atrophy Skin: warm and dry, no rash Neuro:  Strength and sensation are intact Psych: euthymic mood, full affect   EKG:  EKG is not ordered today.    Recent Labs: 10/05/2013: TSH 4.91* 07/03/2014: Hemoglobin 12.9; Platelets 380 08/06/2014: ALT 13; BUN 14; Creatinine 0.74; Potassium 4.3; Sodium 134*    Lipid Panel    Component Value Date/Time   CHOL 163 08/06/2014 0948   TRIG 87.0 08/06/2014 0948   HDL 68.70 08/06/2014 0948   CHOLHDL 2 08/06/2014 0948   VLDL 17.4  08/06/2014 0948   LDLCALC 77 08/06/2014 0948   LDLDIRECT 101.1 10/21/2010 0954      Wt Readings from Last 3 Encounters:  08/09/14 160 lb 6.4 oz (72.757 kg)  07/09/14 160 lb 4 oz (72.689 kg)  07/03/14 155 lb (70.308 kg)       ASSESSMENT AND PLAN:  1.hypercholesterolemia, intolerant of statins.Now on trial of pravastatin 2. Chest pain responsive to sublingual nitroglycerin. No previous objective evidence of ischemic heart disease. 3. Neuropathy  followed by neurologist 4. History of vitamin B-12 deficiency receiving monthly B12 shots 5.  Recent right brain thrombotic stroke, now on plavix  Disposition: Continue on same medication. Recheck here in 6 months for office visit and EKG    Current medicines are reviewed at length with the patient today.  The patient does not have concerns regarding medicines.  The following changes have been made:  no change  Labs/ tests ordered today include:  No orders of the defined types were placed in this encounter.      Berna Spare MD 08/09/2014 12:21 PM    [facility] [address], [place], [place]  [address] Phone: [phone]; Fax: [phone]

## 2014-08-09 NOTE — Patient Instructions (Signed)
Medication Instructions:  Your physician recommends that you continue on your current medications as directed. Please refer to the Current Medication list given to you today.  Labwork: NONE3  Testing/Procedures: NONE  Follow-Up: Your physician wants you to follow-up in: [place] will receive a reminder letter in the mail two months in advance. If you don't receive a letter, please call our office to schedule the follow-up appointment.

## 2014-08-09 NOTE — Telephone Encounter (Signed)
Left detailed mess informing pt no further eval, or changes are needed at this time and to keep her ROV with PCP as scheduled.    Entered by Hendricks Limes, MD at 08/08/2014 8:10 AM     Read by Roe Coombs at 08/08/2014 8:32 AM    The minimally decreased sodium and minimally elevated sugar is not significant and no further evaluation is needed.All other lab results are excellent.

## 2014-08-11 ENCOUNTER — Other Ambulatory Visit: Payer: Self-pay | Admitting: Internal Medicine

## 2014-08-12 DIAGNOSIS — M47897 Other spondylosis, lumbosacral region: Secondary | ICD-10-CM | POA: Diagnosis not present

## 2014-08-12 DIAGNOSIS — M6281 Muscle weakness (generalized): Secondary | ICD-10-CM | POA: Diagnosis not present

## 2014-08-12 DIAGNOSIS — I69320 Aphasia following cerebral infarction: Secondary | ICD-10-CM | POA: Diagnosis not present

## 2014-08-12 DIAGNOSIS — F329 Major depressive disorder, single episode, unspecified: Secondary | ICD-10-CM | POA: Diagnosis not present

## 2014-08-12 DIAGNOSIS — R279 Unspecified lack of coordination: Secondary | ICD-10-CM | POA: Diagnosis not present

## 2014-08-12 DIAGNOSIS — I10 Essential (primary) hypertension: Secondary | ICD-10-CM | POA: Diagnosis not present

## 2014-08-13 DIAGNOSIS — I10 Essential (primary) hypertension: Secondary | ICD-10-CM | POA: Diagnosis not present

## 2014-08-13 DIAGNOSIS — M6281 Muscle weakness (generalized): Secondary | ICD-10-CM | POA: Diagnosis not present

## 2014-08-13 DIAGNOSIS — F329 Major depressive disorder, single episode, unspecified: Secondary | ICD-10-CM | POA: Diagnosis not present

## 2014-08-13 DIAGNOSIS — I69320 Aphasia following cerebral infarction: Secondary | ICD-10-CM | POA: Diagnosis not present

## 2014-08-13 DIAGNOSIS — M47897 Other spondylosis, lumbosacral region: Secondary | ICD-10-CM | POA: Diagnosis not present

## 2014-08-13 DIAGNOSIS — R279 Unspecified lack of coordination: Secondary | ICD-10-CM | POA: Diagnosis not present

## 2014-08-14 ENCOUNTER — Ambulatory Visit (INDEPENDENT_AMBULATORY_CARE_PROVIDER_SITE_OTHER): Payer: Medicare Other | Admitting: Family Medicine

## 2014-08-14 ENCOUNTER — Other Ambulatory Visit (INDEPENDENT_AMBULATORY_CARE_PROVIDER_SITE_OTHER): Payer: Medicare Other

## 2014-08-14 ENCOUNTER — Encounter: Payer: Self-pay | Admitting: Family Medicine

## 2014-08-14 VITALS — BP 126/74 | HR 82 | Ht 60.0 in | Wt 160.0 lb

## 2014-08-14 DIAGNOSIS — M47817 Spondylosis without myelopathy or radiculopathy, lumbosacral region: Secondary | ICD-10-CM

## 2014-08-14 DIAGNOSIS — M159 Polyosteoarthritis, unspecified: Secondary | ICD-10-CM

## 2014-08-14 DIAGNOSIS — M4698 Unspecified inflammatory spondylopathy, sacral and sacrococcygeal region: Secondary | ICD-10-CM | POA: Diagnosis not present

## 2014-08-14 DIAGNOSIS — I63511 Cerebral infarction due to unspecified occlusion or stenosis of right middle cerebral artery: Secondary | ICD-10-CM

## 2014-08-14 DIAGNOSIS — M47818 Spondylosis without myelopathy or radiculopathy, sacral and sacrococcygeal region: Secondary | ICD-10-CM

## 2014-08-14 DIAGNOSIS — M25511 Pain in right shoulder: Secondary | ICD-10-CM

## 2014-08-14 DIAGNOSIS — M150 Primary generalized (osteo)arthritis: Secondary | ICD-10-CM

## 2014-08-14 NOTE — Assessment & Plan Note (Signed)
Patient given another injection today and tolerated the procedure very well. We discussed that we can continue to do the injection on a regular basis. Patient was to avoid all surgical intervention and does not want to get any more aggressive. Patient was avoid any other medications with her other comorbidities when she can. Patient given a trial of topical anti-inflammatory that I think with beneficial. We will send a prescription to see if this will be helpful. Patient and will come back and see me again in 3 months.

## 2014-08-14 NOTE — Telephone Encounter (Signed)
Called pt to check on her and to see how she is feeling. Pt stated that she is feeling better and was beginning to think that the therapy was not working but states that she it is and she is feeling better.

## 2014-08-14 NOTE — Patient Instructions (Signed)
Good to see you You are doing great for all considering.  Ice in 6 hours For your foot try pennsaid instead of voltaren See me again 3 months

## 2014-08-14 NOTE — Progress Notes (Signed)
Pre visit review using our clinic review tool, if applicable. No additional management support is needed unless otherwise documented below in the visit note. 

## 2014-08-14 NOTE — Progress Notes (Signed)
CC: Back pain followup  HPI: Patient is a very pleasant 81 year old female coming in with chronic back pain. Patient was treated for severe sacroiliac joint arthritis. It has been greater than 3 months since patient's last injection. Patient since last exam though did have a hospitalization when patient did have a CVA. Patient states that she is doing somewhat better at this time. But continues to start having pain. Seems that the three-month interval was significant enough that it seemed to be doing much better until this week. Patient would like to have another injection if possible.  Patient is also complaining of right shoulder pain. She was seen previously and did have more of a degenerative tear of the rotator cuff. Patient did have degenerative changes of the glenohumeral joint as well. Patient was given an injection. Patient states that the shoulder seems to be doing relatively well.    Past medical, surgical, family and social history reviewed. Medications reviewed all in the electronic medical record.   Review of Systems: No headache, visual changes, nausea, vomiting, diarrhea, constipation, dizziness, abdominal pain, skin rash, fevers, chills, night sweats, weight loss, swollen lymph nodes, body aches, joint swelling, muscle aches, chest pain, shortness of breath, mood changes.   Objective:    Blood pressure 126/74, pulse 82, height 5' (1.524 m), weight 160 lb (72.576 kg).   General: No apparent distress alert and oriented x3 mood and affect normal, dressed appropriately.  HEENT: Pupils equal, extraocular movements intact Respiratory: Patient's speak in full sentences and does not appear short of breath Cardiovascular: No lower extremity edema, non tender, no erythema Skin: Warm dry intact with no signs of infection or rash on extremities or on axial skeleton. Abdomen: Soft nontender Neuro: Cranial nerves II through XII are intact, neurovascularly intact in all extremities with 2+  DTRs and 2+ pulses. Lymph: No lymphadenopathy of posterior or anterior cervical chain or axillae bilaterally.  Gait walks with a cane mild broad-based gait MSK: Non tender with full range of motion and good stability and symmetric strength and tone of shoulders, elbows, wrist, hip, and ankles bilaterally. Moderate loss of range of motion of multiple joints secondary to osteoporosis. Shoulder: Right Inspection reveals no abnormalities, atrophy or asymmetry. Palpation is normal with no tenderness over AC joint or bicipital groove. ROM is full in all planes passively. Rotator cuff strength normal throughout. signs of impingement with positive Neer and Hawkin's tests, but negative empty can sign. Speeds and Yergason's tests normal. No labral pathology noted with negative Obrien's, negative clunk and good stability. Normal scapular function observed. No painful arc and no drop arm sign. No apprehension sign Contralateral shoulder has some mild crepitus and impingement as well but not as severe as the right side. Mild improvement from previous exam  Back Exam:  Inspection:increase kyphosis as well as some scoliosis Motion: Flexion 35 deg, Extension 25 deg, Side Bending to 25 deg bilaterally,  Rotation to 25 deg bilaterally  SLR laying: pain but no radiation  XSLR laying: Negative  Palpable tenderness: Right SI joint still minimally tender FABER: unable for patient to participate. Sensory change: Gross sensation intact to all lumbar and sacral dermatomes.  Reflexes: 2+ at both patellar tendons, 2+ at achilles tendons, Babinski's downgoing.  Strength at foot  Plantar-flexion: 5/5 Dorsi-flexion: 5/5 Eversion: 5/5 Inversion: 5/5  Leg strength  Quad: 4/5 Hamstring: 4/5 Hip flexor: 4/5 Hip abductors: 3/5  But symmetric to contralateral side.   Procedure: Real-time Ultrasound Guided Injection of left sacroiliac joint Device: GE Logiq  E  Ultrasound guided injection is preferred based studies  that show increased duration, increased effect, greater accuracy, decreased procedural pain, increased response rate, and decreased cost with ultrasound guided versus blind injection.  Verbal informed consent obtained.  Time-out conducted.  Noted no overlying erythema, induration, or other signs of local infection.  Skin prepped in a sterile fashion.  Local anesthesia: Topical Ethyl chloride.  With sterile technique and under real time ultrasound guidance:  With a 22-gauge 3-1/2 inch needle patient was injected with 1 mL of 0.5% Marcaine and 1 mL of Kenalog 40 mg/dL into the joint specified. Completed without difficulty  Pain immediately resolved suggesting accurate placement of the medication.  Advised to call if fevers/chills, erythema, induration, drainage, or persistent bleeding.  Images permanently stored and available for review in the ultrasound unit.  Impression: Technically successful ultrasound guided injection.   Previous imaging studies XR lumbar spine 06/07/2012: Findings: There is curvature convex to the left. There is advanced degenerative disc disease at T12-L1, L1-2 and L4-5. The L5-S1 level is transitional. There is anterolisthesis of 3 mm of L4-5.  There is retrolisthesis of 3 mm at T12-L1 and L1-2. These degenerative changes have worsened since 2010. No evidence of fracture.  IMPRESSION:  Curvature convex to the left. Worsening of degenerative disc disease at T12-L1, L1-2 and L4-5.    Impression and Recommendations:     This case required medical decision making of moderate complexity.

## 2014-08-14 NOTE — Assessment & Plan Note (Signed)
Discuss again over-the-counter medications that could be beneficial and will not interact with her other medications. We discussed which things potentially avoid the long run and we will consider changing medication the patient was a talk to primary care provider.

## 2014-08-14 NOTE — Assessment & Plan Note (Signed)
Has improved. We'll continue to monitor.

## 2014-08-16 DIAGNOSIS — M47897 Other spondylosis, lumbosacral region: Secondary | ICD-10-CM | POA: Diagnosis not present

## 2014-08-16 DIAGNOSIS — F329 Major depressive disorder, single episode, unspecified: Secondary | ICD-10-CM | POA: Diagnosis not present

## 2014-08-16 DIAGNOSIS — I10 Essential (primary) hypertension: Secondary | ICD-10-CM | POA: Diagnosis not present

## 2014-08-16 DIAGNOSIS — R279 Unspecified lack of coordination: Secondary | ICD-10-CM | POA: Diagnosis not present

## 2014-08-16 DIAGNOSIS — I69320 Aphasia following cerebral infarction: Secondary | ICD-10-CM | POA: Diagnosis not present

## 2014-08-16 DIAGNOSIS — M6281 Muscle weakness (generalized): Secondary | ICD-10-CM | POA: Diagnosis not present

## 2014-08-19 DIAGNOSIS — M6281 Muscle weakness (generalized): Secondary | ICD-10-CM | POA: Diagnosis not present

## 2014-08-19 DIAGNOSIS — M47897 Other spondylosis, lumbosacral region: Secondary | ICD-10-CM | POA: Diagnosis not present

## 2014-08-19 DIAGNOSIS — I69320 Aphasia following cerebral infarction: Secondary | ICD-10-CM | POA: Diagnosis not present

## 2014-08-19 DIAGNOSIS — R279 Unspecified lack of coordination: Secondary | ICD-10-CM | POA: Diagnosis not present

## 2014-08-19 DIAGNOSIS — F329 Major depressive disorder, single episode, unspecified: Secondary | ICD-10-CM | POA: Diagnosis not present

## 2014-08-19 DIAGNOSIS — I10 Essential (primary) hypertension: Secondary | ICD-10-CM | POA: Diagnosis not present

## 2014-08-20 DIAGNOSIS — M47897 Other spondylosis, lumbosacral region: Secondary | ICD-10-CM | POA: Diagnosis not present

## 2014-08-20 DIAGNOSIS — I10 Essential (primary) hypertension: Secondary | ICD-10-CM | POA: Diagnosis not present

## 2014-08-20 DIAGNOSIS — R279 Unspecified lack of coordination: Secondary | ICD-10-CM | POA: Diagnosis not present

## 2014-08-20 DIAGNOSIS — I69320 Aphasia following cerebral infarction: Secondary | ICD-10-CM | POA: Diagnosis not present

## 2014-08-20 DIAGNOSIS — F329 Major depressive disorder, single episode, unspecified: Secondary | ICD-10-CM | POA: Diagnosis not present

## 2014-08-20 DIAGNOSIS — M6281 Muscle weakness (generalized): Secondary | ICD-10-CM | POA: Diagnosis not present

## 2014-08-21 DIAGNOSIS — M6281 Muscle weakness (generalized): Secondary | ICD-10-CM | POA: Diagnosis not present

## 2014-08-21 DIAGNOSIS — I10 Essential (primary) hypertension: Secondary | ICD-10-CM | POA: Diagnosis not present

## 2014-08-21 DIAGNOSIS — R279 Unspecified lack of coordination: Secondary | ICD-10-CM | POA: Diagnosis not present

## 2014-08-21 DIAGNOSIS — M47897 Other spondylosis, lumbosacral region: Secondary | ICD-10-CM | POA: Diagnosis not present

## 2014-08-21 DIAGNOSIS — F329 Major depressive disorder, single episode, unspecified: Secondary | ICD-10-CM | POA: Diagnosis not present

## 2014-08-21 DIAGNOSIS — I69320 Aphasia following cerebral infarction: Secondary | ICD-10-CM | POA: Diagnosis not present

## 2014-08-27 DIAGNOSIS — I69320 Aphasia following cerebral infarction: Secondary | ICD-10-CM | POA: Diagnosis not present

## 2014-08-27 DIAGNOSIS — F329 Major depressive disorder, single episode, unspecified: Secondary | ICD-10-CM | POA: Diagnosis not present

## 2014-08-27 DIAGNOSIS — R279 Unspecified lack of coordination: Secondary | ICD-10-CM | POA: Diagnosis not present

## 2014-08-27 DIAGNOSIS — M47897 Other spondylosis, lumbosacral region: Secondary | ICD-10-CM | POA: Diagnosis not present

## 2014-08-27 DIAGNOSIS — I10 Essential (primary) hypertension: Secondary | ICD-10-CM | POA: Diagnosis not present

## 2014-08-27 DIAGNOSIS — M6281 Muscle weakness (generalized): Secondary | ICD-10-CM | POA: Diagnosis not present

## 2014-08-28 ENCOUNTER — Telehealth: Payer: Self-pay | Admitting: Internal Medicine

## 2014-08-28 NOTE — Telephone Encounter (Signed)
PLEASE NOTE: All timestamps contained within this report are represented as [place] Standard Time. CONFIDENTIALTY NOTICE: This fax transmission is intended only for the addressee. It contains information that is legally privileged, confidential or otherwise protected from use or disclosure. If you are not the intended recipient, you are strictly prohibited from reviewing, disclosing, copying using or disseminating any of this information or taking any action in reliance on or regarding this information. If you have received this fax in error, please notify us immediately by telephone so that we can arrange for its return to [name]. Phone: [phone], Toll-Free: [phone], Fax: [phone] Page: 1 of 1 Call Id: [id number] [facility] - Client [place] Patient Name: Sarah [name] [facility] DOB: February 10, 1927 Initial Comment Caller states she has headache Nurse Assessment Nurse: Markus Daft, RN, Sherre Poot Date/Time (Eastern Time): 08/28/2014 11:41:21 AM Confirm and document reason for call. If symptomatic, describe symptoms. ---Caller states she has headache for last 2 days. Taking Tylenol. Worse pain this AM, rates 7/10. Has the patient traveled out of the country within the last 30 days? ---No Does the patient require triage? ---Yes Related visit to physician within the last 2 weeks? ---No Does the PT have any chronic conditions? (i.e. diabetes, asthma, etc.) ---Yes List chronic conditions. ---TIA 08/02/14 - slurred speech HA; MRI showed a blockage in the brain; Neuropathy in legs; uses walker Guidelines Guideline Title Affirmed Question Affirmed Notes Headache [1] MODERATE headache (e.g., interferes with normal activities) AND [2] present > 24 hours AND [3] unexplained (Exceptions: analgesics not tried, typical migraine, or headache part of viral illness) Final Disposition User See Physician within [place], RN, [place] Comments RN  attempted to get pt appt for tomorrow AM with Dr. Ronnald Ramp, but caller declined. She plans to go to UC near her home today.

## 2014-08-29 ENCOUNTER — Encounter (HOSPITAL_COMMUNITY): Payer: Self-pay

## 2014-08-29 ENCOUNTER — Emergency Department (HOSPITAL_COMMUNITY)
Admission: EM | Admit: 2014-08-29 | Discharge: 2014-08-29 | Disposition: A | Discharge status: Home / Self Care | Payer: Medicare Other | Attending: Emergency Medicine | Admitting: Emergency Medicine

## 2014-08-29 ENCOUNTER — Ambulatory Visit (INDEPENDENT_AMBULATORY_CARE_PROVIDER_SITE_OTHER): Payer: Medicare Other | Admitting: Family Medicine

## 2014-08-29 ENCOUNTER — Emergency Department (HOSPITAL_COMMUNITY): Payer: Medicare Other

## 2014-08-29 VITALS — BP 128/60 | HR 90 | Temp 98.4°F | Resp 16

## 2014-08-29 DIAGNOSIS — I10 Essential (primary) hypertension: Secondary | ICD-10-CM | POA: Diagnosis not present

## 2014-08-29 DIAGNOSIS — F329 Major depressive disorder, single episode, unspecified: Secondary | ICD-10-CM | POA: Insufficient documentation

## 2014-08-29 DIAGNOSIS — E785 Hyperlipidemia, unspecified: Secondary | ICD-10-CM | POA: Diagnosis not present

## 2014-08-29 DIAGNOSIS — Z87448 Personal history of other diseases of urinary system: Secondary | ICD-10-CM | POA: Diagnosis not present

## 2014-08-29 DIAGNOSIS — Z91040 Latex allergy status: Secondary | ICD-10-CM | POA: Insufficient documentation

## 2014-08-29 DIAGNOSIS — Z7952 Long term (current) use of systemic steroids: Secondary | ICD-10-CM | POA: Insufficient documentation

## 2014-08-29 DIAGNOSIS — I639 Cerebral infarction, unspecified: Secondary | ICD-10-CM

## 2014-08-29 DIAGNOSIS — R269 Unspecified abnormalities of gait and mobility: Secondary | ICD-10-CM

## 2014-08-29 DIAGNOSIS — R519 Headache, unspecified: Secondary | ICD-10-CM

## 2014-08-29 DIAGNOSIS — Z8673 Personal history of transient ischemic attack (TIA), and cerebral infarction without residual deficits: Secondary | ICD-10-CM | POA: Diagnosis not present

## 2014-08-29 DIAGNOSIS — Z8719 Personal history of other diseases of the digestive system: Secondary | ICD-10-CM | POA: Insufficient documentation

## 2014-08-29 DIAGNOSIS — Z791 Long term (current) use of non-steroidal anti-inflammatories (NSAID): Secondary | ICD-10-CM | POA: Insufficient documentation

## 2014-08-29 DIAGNOSIS — M1990 Unspecified osteoarthritis, unspecified site: Secondary | ICD-10-CM | POA: Diagnosis not present

## 2014-08-29 DIAGNOSIS — R531 Weakness: Secondary | ICD-10-CM | POA: Diagnosis not present

## 2014-08-29 DIAGNOSIS — Z8619 Personal history of other infectious and parasitic diseases: Secondary | ICD-10-CM | POA: Diagnosis not present

## 2014-08-29 DIAGNOSIS — R0789 Other chest pain: Secondary | ICD-10-CM

## 2014-08-29 DIAGNOSIS — R51 Headache: Secondary | ICD-10-CM | POA: Diagnosis not present

## 2014-08-29 DIAGNOSIS — M810 Age-related osteoporosis without current pathological fracture: Secondary | ICD-10-CM | POA: Diagnosis not present

## 2014-08-29 DIAGNOSIS — D320 Benign neoplasm of cerebral meninges: Secondary | ICD-10-CM | POA: Diagnosis not present

## 2014-08-29 DIAGNOSIS — Z79899 Other long term (current) drug therapy: Secondary | ICD-10-CM | POA: Diagnosis not present

## 2014-08-29 DIAGNOSIS — Z86010 Personal history of colonic polyps: Secondary | ICD-10-CM | POA: Insufficient documentation

## 2014-08-29 DIAGNOSIS — R9082 White matter disease, unspecified: Secondary | ICD-10-CM | POA: Diagnosis not present

## 2014-08-29 DIAGNOSIS — H53149 Visual discomfort, unspecified: Secondary | ICD-10-CM | POA: Diagnosis not present

## 2014-08-29 LAB — COMPREHENSIVE METABOLIC PANEL
ALBUMIN: 3.4 g/dL — AB (ref 3.5–5.0)
ALT: 14 U/L (ref 14–54)
AST: 10 U/L — ABNORMAL LOW (ref 15–41)
Alkaline Phosphatase: 41 U/L (ref 38–126)
Anion gap: 7 (ref 5–15)
BUN: 14 mg/dL (ref 6–20)
CALCIUM: 8.7 mg/dL — AB (ref 8.9–10.3)
CHLORIDE: 95 mmol/L — AB (ref 101–111)
CO2: 26 mmol/L (ref 22–32)
CREATININE: 0.81 mg/dL (ref 0.44–1.00)
GFR calc Af Amer: >60 mL/min (ref >60)
GFR calc non Af Amer: >60 mL/min (ref >60)
GLUCOSE: 105 mg/dL — AB (ref 65–99)
Potassium: 4.7 mmol/L (ref 3.5–5.1)
SODIUM: 128 mmol/L — AB (ref 135–145)
Total Bilirubin: 0.3 mg/dL (ref 0.3–1.2)
Total Protein: 6.3 g/dL — ABNORMAL LOW (ref 6.5–8.1)

## 2014-08-29 LAB — CBC
HCT: 35.9 % — ABNORMAL LOW (ref 36.0–46.0)
Hemoglobin: 12.1 g/dL (ref 12.0–15.0)
MCH: 31.5 pg (ref 26.0–34.0)
MCHC: 33.7 g/dL (ref 30.0–36.0)
MCV: 93.5 fL (ref 78.0–100.0)
Platelets: 299 10*3/uL (ref 150–400)
RBC: 3.84 MIL/uL — ABNORMAL LOW (ref 3.87–5.11)
RDW: 13.3 % (ref 11.5–15.5)
WBC: 6.9 10*3/uL (ref 4.0–10.5)

## 2014-08-29 LAB — I-STAT TROPONIN, ED: TROPONIN I, POC: 0.05 ng/mL (ref 0.00–0.08)

## 2014-08-29 LAB — TROPONIN I: Troponin I: <0.03 ng/mL (ref <0.031)

## 2014-08-29 MED ORDER — ALPRAZOLAM 0.5 MG PO TABS
0.5000 mg | ORAL_TABLET | Freq: Once | ORAL | Status: AC
  Administered 2014-08-29: 0.5 mg via ORAL
  Filled 2014-08-29: qty 1

## 2014-08-29 MED ORDER — TRAMADOL HCL 50 MG PO TABS
50.0000 mg | ORAL_TABLET | Freq: Once | ORAL | Status: AC
  Administered 2014-08-29: 50 mg via ORAL
  Filled 2014-08-29: qty 1

## 2014-08-29 NOTE — ED Provider Notes (Signed)
CSN: [id number]     Arrival date & time 08/29/14  1351 History   First MD Initiated Contact with Patient 08/29/14 1450     Chief Complaint  Patient presents with  . Headache   HPI Sarah [name] is an 81 yo F with PMHx of CVA in April 2016, anxiety, OA, osteoporosis and atypical chest pain who presents with complaint of both headache and chest wall pain. Patient states she has had a bitemporal headache for 2 days, 8/10 with some relief from tylenol, and sensitivity to light and sound. She denies any prior history of headaches. She denies nausea or vomiting. Patient does have a history of headache during her previous stroke, which she is concerned about. She denies any slurred speech, changes in vision or hearing, numbness, tingling, weakness. Headache is also different from her prior headache during her CVA. Patient has been compliant with her plavix and pravastatin. Her gait is unchanged.  Patient also complaints of bilateral chest wall pain that she states feels like a bone or muscle pain. Pain is located bilaterally below her breasts with radiation to the back. She describes it as [place] and a 6/10. Tylenol makes the pain better. Pain is reproducible with palpation. Pain is not worsened by deep inspiration or movement. She states she coughed up phlegm several days ago, but denies any recurrent cough, congestion, fever, chills, shortness of breath, wheezing. Patient has not prior history of heart disease or lung disease. Patient had a stress test 5 years ago which was normal. She recently saw her Cardiologist, Dr. Mare Ferrari May 2016 and everything was normal. Patient will occasionally have atypical chest pain which resolves with nitroglycerin, but patient states this pain is not similar to those prior episodes.   Past Medical History  Diagnosis Date  . Osteoarthritis   . Bladder incontinence   . Constipation     hx of  . Hypertension   . Hyperlipidemia   . Chest pain     S/P nuclear 07/17/2008   normal; EF 88% with no ischemia  . Chronic venous insufficiency     BLE edema, chronic, neg venous US 2/13  . History of chicken pox   . Depression   . Diverticulitis   . Polyp of colon   . Osteoporosis, postmenopausal 06/02/2011  . Esophageal stricture     s/p dilation 04/2012  . GERD (gastroesophageal reflux disease)   . Stroke 07/03/2014   Past Surgical History  Procedure Laterality Date  . Appendectomy      age 75  . Tubal ligation    . Polypectomy    . Colonoscopy    . Upper gastrointestinal endoscopy     Family History  Problem Relation Age of Onset  . Cancer Son     testicular cancer  . Colon cancer Neg Hx   . Esophageal cancer Neg Hx   . Rectal cancer Neg Hx   . Stomach cancer Neg Hx   . Breast cancer Sister   . Skin cancer Sister    History  Substance Use Topics  . Smoking status: Never Smoker   . Smokeless tobacco: Never Used  . Alcohol Use: No   OB History    No data available     Review of Systems General: Denies fever, chills, fatigue, change in appetite and diaphoresis.  Respiratory: Denies SOB, cough, DOE, chest tightness, and wheezing.   Cardiovascular: Admits to chest wall pain. Denies palpitations.  Gastrointestinal: Denies nausea, vomiting Musculoskeletal: Admits to chest wall myalgias, Denies  back pain Skin: Denies rash  Neurological: Admits to headache, photophobia and phonophobia. Denies dizziness, weakness, lightheadedness, numbness, slurred speech, changes in vision or hearing  Allergies  Celebrex; Lipitor; Lovastatin; Other; and Latex  Home Medications   Prior to Admission medications   Medication Sig Start Date End Date Taking? Authorizing Provider  acetaminophen (TYLENOL) 500 MG tablet Take 1 tablet (500 mg total) by mouth every 8 (eight) hours as needed (for pain). 08/09/14   Darlin Coco, MD  ALPRAZolam Duanne Moron) 1 MG tablet Take 1 tablet (1 mg total) by mouth 2 (two) times daily. 03/12/14   Rowe Clack, MD  Cholecalciferol  (VITAMIN D PO) Take 1 tablet by mouth daily.     Historical Provider, MD  clobetasol ointment (TEMOVATE) 7.98 % Apply 1 application topically 2 (two) times daily.  02/03/12   Historical Provider, MD  clopidogrel (PLAVIX) 75 MG tablet Take 75 mg by mouth daily.    Historical Provider, MD  cyanocobalamin (,VITAMIN B-12,) 1000 MCG/ML injection INJECT 1 ML INTO THE MUSCLE EVERY 30 DAYS. 01/11/14   Rowe Clack, MD  diclofenac sodium (VOLTAREN) 1 % GEL Apply  To feet 3 times daily prn for pain 05/20/14   Donika K Patel, DO  furosemide (LASIX) 20 MG tablet Take 20 mg by mouth daily as needed (for swelling).    Historical Provider, MD  Glucosamine HCl 750 MG TABS Take 1 tablet by mouth 2 (two) times daily.     Historical Provider, MD  ibandronate (BONIVA) 150 MG tablet TAKE ONE TABLET ONCE A MONTH IN THE MORNING ON AN EMPTY STOMACH WITH WATER. REMAIN UPRIGHT. 05/24/14   Rowe Clack, MD  Misc Natural Products [facility]) TABS Take 1 capsule by mouth 2 (two) times daily. 03/01/13   Lyndal Pulley, DO  nitroGLYCERIN (NITROSTAT) 0.4 MG SL tablet Place 1 tablet (0.4 mg total) under the tongue every 5 (five) minutes as needed for chest pain. 07/20/13   Darlin Coco, MD  nortriptyline (PAMELOR) 25 MG capsule Take 25 mg by mouth at bedtime.    Historical Provider, MD  pravastatin (PRAVACHOL) 20 MG tablet 1/2 qd 07/09/14   Hendricks Limes, MD  solifenacin (VESICARE) 10 MG tablet Take 10 mg by mouth daily as needed (to reduce number of bathroom visits during travel).    Historical Provider, MD  triazolam (HALCION) 0.25 MG tablet Take 0.5 tablets (0.125 mg total) by mouth at bedtime as needed for sleep. 08/12/14   Rowe Clack, MD   Physical Exam Filed Vitals:   08/29/14 1545 08/29/14 1611 08/29/14 1615 08/29/14 1703  BP: 135/76  144/50 141/64  Pulse: 82  79 75  Temp:  98 F (36.7 C)    TempSrc:      Resp: 15  23 19   Height:      Weight:      SpO2: 95%  98% 99%   General: Vital signs  reviewed.  Patient is well-developed and well-nourished, in no acute distress and cooperative with exam.  Cardiovascular: RRR, S1 normal, S2 normal Pulmonary/Chest: Clear to auscultation bilaterally, no wheezes, rales, or rhonchi. Tenderness reproducible with palpation on anterior chest wall anteriorly and inferiorly Abdominal: Soft, non-tender, non-distended, BS + Extremities: No lower extremity edema bilaterally, pulses symmetric and intact bilaterally.   Skin: Warm, dry and intact. No rashes or erythema. Psychiatric: Normal mood and affect. speech and behavior is normal. Cognition and memory are normal.   Neurologic Exam:   Mental Status: Alert, oriented, thought content appropriate.  Speech fluent without evidence of aphasia. Able to follow 3 step commands without difficulty.  Cranial Nerves:   II: Visual fields grossly intact.  III/IV/VI: Extraocular movements intact. Pupils reactive bilaterally.  V/VII: Smile symmetric. facial light touch sensation normal bilaterally.  VIII: Grossly intact.  IX/X: Normal gag.  XI: Bilateral shoulder shrug normal.  XII: Midline tongue extension normal.  Motor:  5/5 bilaterally with normal tone and bulk  Sensory:  Pinprick and light touch intact throughout, bilaterally  Cerebellar: Normal finger-to-nose, normal rapid alternating movements and normal heel-to-shin test.      ED Course  Procedures (including critical care time) Labs Review Labs Reviewed  CBC - Abnormal; Notable for the following:    RBC 3.84 (*)    HCT 35.9 (*)    All other components within normal limits  COMPREHENSIVE METABOLIC PANEL - Abnormal; Notable for the following:    Sodium 128 (*)    Chloride 95 (*)    Glucose, Bld 105 (*)    Calcium 8.7 (*)    Total Protein 6.3 (*)    Albumin 3.4 (*)    AST 10 (*)    All other components within normal limits  TROPONIN I  I-STAT TROPOININ, ED   Imaging Review Ct Head Wo Contrast  08/29/2014   CLINICAL DATA:  Headache for 2  days. Stroke 2 months ago which cause left-sided weakness.  EXAM: CT HEAD WITHOUT CONTRAST  TECHNIQUE: Contiguous axial images were obtained from the base of the skull through the vertex without intravenous contrast.  COMPARISON:  Multiple exams, including 07/03/2014  FINDINGS: The brainstem, cerebellum, cerebral peduncles, thalamus, basal ganglia, basilar cisterns, and ventricular system appear within normal limits. Periventricular white matter and corona radiata hypodensities favor chronic ischemic microvascular white matter disease. There is some noticeably increased hypodensity in the posterior right frontal lobe white matter corresponding to the known infarct from 07/03/2014.  There is a thin calcified meningioma of the right sphenoid wing shown on images 10-12 series 202. No other mass is identified. No intracranial hemorrhage or acute CVA identified.  Chronic right maxillary sinusitis.  IMPRESSION: 1. No acute intracranial findings. 2. Periventricular white matter and corona radiata hypodensities favor chronic ischemic microvascular white matter disease. The right frontal lobe white matter infarct shown to be acute in April, 2016 is somewhat indistinctly apparent in the periventricular white matter, but no new infarct is identified. 3. Right sphenoid wing calcified meningioma. 4. Chronic right maxillary sinusitis.   Electronically Signed   By: Van Clines M.D.   On: 08/29/2014 16:52    EKG Interpretation   Date/Time:  Thursday August 29 2014 16:03:27 EDT Ventricular Rate:  79 PR Interval:  184 QRS Duration: 82 QT Interval:  385 QTC Calculation: 441 R Axis:   23 Text Interpretation:  Sinus rhythm No significant change since last  tracing Confirmed by Ashok Cordia  MD, Lennette Bihari ([id number]) on 08/29/2014 4:12:34 PM     MDM   Final diagnoses:  Headache  Musculoskeletal chest pain   Sarah [name] is an 81 yo F with PMHx of CVA in April 2016, anxiety, OA, osteoporosis and atypical chest pain who  presented with complaint of both headache and chest wall pain. Headache is most likely secondary to tension headache or migraine and not secondary to CVA. Doubt CVA given normal neurological examination. Headache has improved with tramadol. CT Head wo contrast showed no acute intracranial findings and chronic ischemic microvascular white matter disease. The right frontal lobe white matter infarct shown to be  acute in April, 2016 is somewhat indistinctly apparent in the periventricular white matter, but no new infarct is identified. Patient should follow up with her neurologist and primary care physician on discharge. Patient has an appointment in 2 weeks. I offered a prescription for tramadol, but patient would prefer to take tylenol.   Patient's bilateral chest wall pain is likely musculoskeletal in nature from new PT exercises given her normal troponins and normal EKG. Patient does not have a significant cardiac history. Doubt referred pain from infectious cause given lack of fever, symptoms and leukocytosis. Pain is better with tylenol. Patient can continue tylenol for pain and should follow up with her primary care physician after discharge.     Vickii Chafe, MD 08/29/14 [place], MD 08/30/14 1447

## 2014-08-29 NOTE — ED Notes (Signed)
Pt started having a headache 2 days ago. Pt also reports that she has a soreness in her ribcage that started before the headache. Denies any nausea or vomiting. Had a stroke in April on the 6th with left sided weakness and went to urgent care today and they sent her here to be checked out to make sure it's not another stroke. No speech deficits. States the pain in her ribcage goes all the way around.

## 2014-08-29 NOTE — Discharge Instructions (Signed)
Thank you for allowing [name] to be involved in your healthcare while you were hospitalized at [facility].   Please note that there have not been changes to your home medications.  --> PLEASE LOOK AT YOUR DISCHARGE MEDICATION LIST FOR DETAILS.  Please call your PCP if you have any questions or concerns, or any difficulty getting any of your medications.  Please return to the ER if you have worsening of your symptoms or new severe symptoms arise.   You can continue to take tylenol for your headache and for pain. Please follow up with your neurologist and primary care physicians. If you feel congested, you can use over the counter flonase or decongestants.  Migraine Headache A migraine headache is an intense, throbbing pain on one or both sides of your head. A migraine can last for 30 minutes to several hours. CAUSES  The exact cause of a migraine headache is not always known. However, a migraine may be caused when nerves in the brain become irritated and release chemicals that cause inflammation. This causes pain. Certain things may also trigger migraines, such as:  Alcohol.  Smoking.  Stress.  Menstruation.  Aged cheeses.  Foods or drinks that contain nitrates, glutamate, aspartame, or tyramine.  Lack of sleep.  Chocolate.  Caffeine.  Hunger.  Physical exertion.  Fatigue.  Medicines used to treat chest pain (nitroglycerine), birth control pills, estrogen, and some blood pressure medicines. SIGNS AND SYMPTOMS  Pain on one or both sides of your head.  Pulsating or throbbing pain.  Severe pain that prevents daily activities.  Pain that is aggravated by any physical activity.  Nausea, vomiting, or both.  Dizziness.  Pain with exposure to bright lights, loud noises, or activity.  General sensitivity to bright lights, loud noises, or smells. Before you get a migraine, you may get warning signs that a migraine is coming (aura). An aura may  include:  Seeing flashing lights.  Seeing bright spots, halos, or zigzag lines.  Having tunnel vision or blurred vision.  Having feelings of numbness or tingling.  Having trouble talking.  Having muscle weakness. DIAGNOSIS  A migraine headache is often diagnosed based on:  Symptoms.  Physical exam.  A CT scan or MRI of your head. These imaging tests cannot diagnose migraines, but they can help rule out other causes of headaches. TREATMENT Medicines may be given for pain and nausea. Medicines can also be given to help prevent recurrent migraines.  HOME CARE INSTRUCTIONS  Only take over-the-counter or prescription medicines for pain or discomfort as directed by your health care provider. The use of long-term narcotics is not recommended.  Lie down in a dark, quiet room when you have a migraine.  Keep a journal to find out what may trigger your migraine headaches. For example, write down:  What you eat and drink.  How much sleep you get.  Any change to your diet or medicines.  Limit alcohol consumption.  Quit smoking if you smoke.  Get 7-9 hours of sleep, or as recommended by your health care provider.  Limit stress.  Keep lights dim if bright lights bother you and make your migraines worse. SEEK IMMEDIATE MEDICAL CARE IF:   Your migraine becomes severe.  You have a fever.  You have a stiff neck.  You have vision loss.  You have muscular weakness or loss of muscle control.  You start losing your balance or have trouble walking.  You feel faint or pass out.  You  have severe symptoms that are different from your first symptoms. MAKE SURE YOU:   Understand these instructions.  Will watch your condition.  Will get help right away if you are not doing well or get worse. Document Released: 03/15/2005 Document Revised: 07/30/2013 Document Reviewed: 11/20/2012 [facility] Patient Information 2015 [place], [place]. This information is not intended to replace  advice given to you by your health care provider. Make sure you discuss any questions you have with your health care provider.

## 2014-08-29 NOTE — Progress Notes (Signed)
Subjective:    Patient ID: Sarah [name], female    DOB: 1927-03-17, 81 y.o.   MRN: [id number] This chart was scribed for Delman Cheadle, MD by Zola Button, Medical Scribe. This patient was seen in Room 7 and the patient's care was started at  Chief Complaint  Patient presents with  . Migraine    x2 days   . Chest Pain    x3 days per pt in the bone    1:15 PM.   HPI HPI Comments: Sarah [name] is a 81 y.o. female with a hx of acute ischemic right MCA stroke who presents to the [facility] complaining of headache that started 2 days ago.  She was seen at [facility] for primary care. She called them yesterday and told them she had a headache for the past 2 days resistant to tylenol. She had a CVA 2 months ago which presented with slurred speech and headache. She had an MRI at that time that showed an acute stroke as well as advanced microvascular ischemia and high grade stenosis of multiple vessels. Patient also reports a TIA 1 month ago. Patient was hospitalized from 4/6 - 4/8 for acute stroke presenting as aphasia and headache on the right. Patient's slurred speech improved during her hospital stay. She is followed by neurology previously for peripheral neuropathy and gait instability; she sees Dr. Posey Pronto. She was started on Plavix and started on a statin cholesterol medication, though had myalgias with Lipitor previously. She was sent home with home health PT and home health nursing aide. She has a hx of atypical chest pain and saw her cardiologist, Dr. Mare Ferrari, 3 weeks ago, and was stable. Her chest pain does respond to sublingual NTG, but no evidence of ischemic heart disease. Does not appear that she has had an opportunity to see her neurologist since her hospitalization.   Patient also reports having chest pain. She feels as if the chest pain is in her bones. It is worsened with deep breaths. She has tried Tylenol for the headache but without relief. Patient denies facial  weakness, speech difficulty, difficulty ambulating, dizziness, numbness/tingling outside of baseline, and urinary or bowel problems.  Past Medical History  Diagnosis Date  . Osteoarthritis   . Bladder incontinence   . Constipation     hx of  . Hypertension   . Hyperlipidemia   . Chest pain     S/P nuclear 07/17/2008  normal; EF 88% with no ischemia  . Chronic venous insufficiency     BLE edema, chronic, neg venous US 2/13  . History of chicken pox   . Depression   . Diverticulitis   . Polyp of colon   . Osteoporosis, postmenopausal 06/02/2011  . Esophageal stricture     s/p dilation 04/2012  . GERD (gastroesophageal reflux disease)   . Stroke 07/03/2014   Current Outpatient Prescriptions on File Prior to Visit  Medication Sig Dispense Refill  . acetaminophen (TYLENOL) 500 MG tablet Take 1 tablet (500 mg total) by mouth every 8 (eight) hours as needed (for pain). 180 tablet 2  . ALPRAZolam (XANAX) 1 MG tablet Take 1 tablet (1 mg total) by mouth 2 (two) times daily. 60 tablet 5  . Cholecalciferol (VITAMIN D PO) Take 1 tablet by mouth daily.     . clobetasol ointment (TEMOVATE) 8.93 % Apply 1 application topically 2 (two) times daily.     . cyanocobalamin (,VITAMIN B-12,) 1000 MCG/ML injection INJECT 1 ML INTO  THE MUSCLE EVERY 30 DAYS. 10 mL 5  . diclofenac sodium (VOLTAREN) 1 % GEL Apply  To feet 3 times daily prn for pain 1 Tube 5  . furosemide (LASIX) 20 MG tablet Take 20 mg by mouth daily as needed (for swelling).    . Glucosamine HCl 750 MG TABS Take 1 tablet by mouth 2 (two) times daily.     Marland Kitchen ibandronate (BONIVA) 150 MG tablet TAKE ONE TABLET ONCE A MONTH IN THE MORNING ON AN EMPTY STOMACH WITH WATER. REMAIN UPRIGHT. 1 tablet 5  . Misc Natural Products (TUMERSAID) TABS Take 1 capsule by mouth 2 (two) times daily. 60 tablet 3  . nitroGLYCERIN (NITROSTAT) 0.4 MG SL tablet Place 1 tablet (0.4 mg total) under the tongue every 5 (five) minutes as needed for chest pain. 100 tablet prn  .  nortriptyline (PAMELOR) 25 MG capsule Take 25 mg by mouth at bedtime.    . pravastatin (PRAVACHOL) 20 MG tablet 1/2 qd 30 tablet 1  . solifenacin (VESICARE) 10 MG tablet Take 10 mg by mouth daily as needed (to reduce number of bathroom visits during travel).    . triazolam (HALCION) 0.25 MG tablet Take 0.5 tablets (0.125 mg total) by mouth at bedtime as needed for sleep. 90 tablet 0   No current facility-administered medications on file prior to visit.   Allergies  Allergen Reactions  . Celebrex [Celecoxib]     rash  . Lipitor [Atorvastatin Calcium]     Leg weakness,memory loss  . Lovastatin     Leg weakness  . Other     Always pads  . Latex Rash     Review of Systems  Cardiovascular: Positive for chest pain.  Gastrointestinal: Negative.   Genitourinary: Negative.   Neurological: Positive for headaches. Negative for dizziness, weakness and numbness.       Objective:  BP 128/60 mmHg  Pulse 90  Temp(Src) 98.4 F (36.9 C) (Oral)  Resp 16  SpO2 96%  Physical Exam  Constitutional: She is oriented to person, place, and time. She appears well-developed and well-nourished. No distress.  HENT:  Head: Normocephalic and atraumatic.  Mouth/Throat: Oropharynx is clear and moist. No oropharyngeal exudate.  Eyes: Pupils are equal, round, and reactive to light.  Neck: Neck supple.  Cardiovascular: Normal rate.   Pulmonary/Chest: Effort normal.  Musculoskeletal: She exhibits no edema.  Neurological: She is alert and oriented to person, place, and time. No cranial nerve deficit. She displays a negative Romberg sign.  Cranial II - XII seem intact on gross exam. Negative pronator drift. Gait unstable, but unchanged from baseline. No ataxia or hemiparesis noted. Speech normal.  Skin: Skin is warm and dry. No rash noted.  Psychiatric: She has a normal mood and affect. Her behavior is normal.  Vitals reviewed.         Assessment & Plan:  Acute nonintractable headache, unspecified  headache type  Abnormality of gait  CVA (cerebral vascular accident)  To ER immed by private vehicle as concern for CVA due to recent similar sxs - needs stat head ct - pt has had sxs for several days so ok to go by private vehicle. I personally performed the services described in this documentation, which was scribed in my presence. The recorded information has been reviewed and considered, and addended by me as needed.  Delman Cheadle, MD MPH

## 2014-09-02 ENCOUNTER — Telehealth: Payer: Self-pay | Admitting: Neurology

## 2014-09-02 ENCOUNTER — Telehealth: Payer: Self-pay | Admitting: *Deleted

## 2014-09-02 ENCOUNTER — Encounter: Payer: Self-pay | Admitting: Neurology

## 2014-09-02 ENCOUNTER — Other Ambulatory Visit: Payer: Self-pay | Admitting: Cardiology

## 2014-09-02 ENCOUNTER — Ambulatory Visit (INDEPENDENT_AMBULATORY_CARE_PROVIDER_SITE_OTHER): Payer: Medicare Other | Admitting: Neurology

## 2014-09-02 VITALS — BP 150/84 | HR 100 | Wt 158.2 lb

## 2014-09-02 DIAGNOSIS — G43001 Migraine without aura, not intractable, with status migrainosus: Secondary | ICD-10-CM

## 2014-09-02 DIAGNOSIS — I63511 Cerebral infarction due to unspecified occlusion or stenosis of right middle cerebral artery: Secondary | ICD-10-CM

## 2014-09-02 DIAGNOSIS — I6350 Cerebral infarction due to unspecified occlusion or stenosis of unspecified cerebral artery: Secondary | ICD-10-CM | POA: Diagnosis not present

## 2014-09-02 DIAGNOSIS — I639 Cerebral infarction, unspecified: Secondary | ICD-10-CM

## 2014-09-02 MED ORDER — TRAMADOL HCL 50 MG PO TABS: 50.0000 mg | ORAL_TABLET | Freq: Two times a day (BID) | ORAL | Status: DC | PRN

## 2014-09-02 NOTE — Telephone Encounter (Signed)
Sarah [name]'s I think daughter returned your call and can be reached at [phone]

## 2014-09-02 NOTE — Progress Notes (Signed)
[facility]  Follow-up Visit   Date: 09/02/2014   Sarah [name] MRN: [id number] DOB: Aug 19, 1926   Interim History: Sarah [name] is a 81 y.o. right-handed Caucasian female with history of HTN, HPL, depression, esophageal strictures, and GERD returning to the clinic for follow-up of multifactorial gait instability.  The patient was accompanied to the clinic by [name], her caregiver.    History of present illness: Leg weakness started slowly around May 2014 when she noticed difficulty with walking and tripping on things. She has a hard time describing her walking problems, but says that her legs feel weak. She would stumble frequently, and denies any falls.  She has associated bilateral achy knee pain which feels tight and is worse when she applies pressure on it. Her symptoms steadily worsened. She reports having chronic back pain but had worsening pain in August 2014. Pain is achy located at the low back and does not radiate into her buttocks or legs. Forward flexion makes her pain worse and it is improved by laying down.   Follow-up 01/16/2013:  Neuropathy labs which all returned normal.  EMG was performed and shows moderate peripheral neuropathy of the legs and L3-L4 old radiculopathy.   Follow-up 04/18/2013:  In December, she saw Dr. Tamala Julian for back pain who found SI joint dysfunction and performed injection which some benefit. Because of ongoing leg weakness, she started using a cane in November 2014 and feels much more stable.  She also has a caregiver that comes 10-2p who helps her dress, prepare meals, and does her errands which she appreciates a lot.    Follow-up 10/09/2013:  She had an unwittnessed fall on 6/17 while transferring a few things to her dining table.  She hit the back of her head and bruised her right wrist.  There was no loss of consciousness. Caregiver says that she is more forgetful than usual, especially with conversation.  Since her fall,  she has had intermittent headaches, described as achy, occuring daily and last all day. It usually responds to tylenol.    Follow-up 11/20/2013:  She developed severe right foot pain and she podiatry who injected her foot, which helped slightly. She applies icey hot to her feet which helps.   Follow-up 02/12/2014:  She continues to have of achy knee and foot pain and reports worsening low back pain.   She uses voltaren and tumeric which significantly helps.   No tingling, besides mild tingling involving the base of her foot. No interval falls, but does endorse worsening imbalance.  UPDATE 05/20/2014:  She completed PT and reports being able to climb stairs which was a huge improvement.  No falls or hospitalizations.  She continues to have balance problems and is very cautious when walking.  Denies any shooting, stabbing, or burning pain. She has musculoskeletal pain involving her right upper arm and feet, which responds to voltaren.  UPDATE 09/02/2014:  Patient had an interval hospitalization for acute stroke manifested with slurred speech which has since resolved.  She called for same-day appointment because of severe bifrontal headache since last week.  She went to the ED 6/2 where tramadol was given and provided relief. She is getting home PT and feels that she may have over exerted herself lately which could have triggered her migraine.  There is significant photophobia and phonophobia.  No nasuea or vomiting.  No other associated neurological symptoms.  Medications:  Current Outpatient Prescriptions on File Prior to Visit  Medication Sig Dispense Refill  .  acetaminophen (TYLENOL) 500 MG tablet Take 1 tablet (500 mg total) by mouth every 8 (eight) hours as needed (for pain). 180 tablet 2  . ALPRAZolam (XANAX) 1 MG tablet Take 1 tablet (1 mg total) by mouth 2 (two) times daily. 60 tablet 5  . Cholecalciferol (VITAMIN D PO) Take 1 tablet by mouth daily.     . clobetasol ointment (TEMOVATE) 4.15 % Apply  1 application topically 2 (two) times daily.     . clopidogrel (PLAVIX) 75 MG tablet Take 75 mg by mouth daily.    . cyanocobalamin (,VITAMIN B-12,) 1000 MCG/ML injection INJECT 1 ML INTO THE MUSCLE EVERY 30 DAYS. 10 mL 5  . diclofenac sodium (VOLTAREN) 1 % GEL Apply  To feet 3 times daily prn for pain 1 Tube 5  . furosemide (LASIX) 20 MG tablet Take 20 mg by mouth daily as needed (for swelling).    . Glucosamine HCl 750 MG TABS Take 1 tablet by mouth 2 (two) times daily.     Marland Kitchen ibandronate (BONIVA) 150 MG tablet TAKE ONE TABLET ONCE A MONTH IN THE MORNING ON AN EMPTY STOMACH WITH WATER. REMAIN UPRIGHT. 1 tablet 5  . Misc Natural Products (TUMERSAID) TABS Take 1 capsule by mouth 2 (two) times daily. 60 tablet 3  . nitroGLYCERIN (NITROSTAT) 0.4 MG SL tablet Place 1 tablet (0.4 mg total) under the tongue every 5 (five) minutes as needed for chest pain. 100 tablet prn  . nortriptyline (PAMELOR) 25 MG capsule Take 25 mg by mouth at bedtime.    . pravastatin (PRAVACHOL) 20 MG tablet 1/2 qd 30 tablet 1  . solifenacin (VESICARE) 10 MG tablet Take 10 mg by mouth daily as needed (to reduce number of bathroom visits during travel).    . triazolam (HALCION) 0.25 MG tablet Take 0.5 tablets (0.125 mg total) by mouth at bedtime as needed for sleep. 90 tablet 0   No current facility-administered medications on file prior to visit.    Allergies:  Allergies  Allergen Reactions  . Celebrex [Celecoxib]     rash  . Lipitor [Atorvastatin Calcium]     Leg weakness,memory loss  . Lovastatin     Leg weakness  . Other     Always pads  . Latex Rash     Review of Systems:  CONSTITUTIONAL: No fevers, chills, night sweats, or weight loss.   EYES: No visual changes or eye pain ENT: No hearing changes.  No history of nose bleeds. RESPIRATORY: No cough, wheezing and shortness of breath.   CARDIOVASCULAR: Negative for chest pain, and palpitations.   GI: Negative for abdominal discomfort, blood in stools or black  stools.  No recent change in bowel habits.  GU:  No history of incontinence.   MUSCLOSKELETAL: No history of joint pain or swelling.  No myalgias.  + back pain SKIN: Negative for lesions, rash, and itching.   HEMATOLOGY/ONCOLOGY: Negative for prolonged bleeding, bruising easily, and swollen nodes.   ENDOCRINE: Negative for cold or heat intolerance, polydipsia or goiter.   PSYCH:  No depression or anxiety symptoms.   NEURO: As Above.   Vital Signs:  BP 150/84 mmHg  Pulse 100  Wt 158 lb 4 oz (71.782 kg)  SpO2 97%  Neurological Exam: MENTAL STATUS including orientation to time, place, person, recent and remote memory is fairly intact.  Slightly blunted affect. No dysarthria. Repetition intact  CRANIAL NERVES:  Pupils round and reactive to light. Extraocular muscles are intact. Face is symmetric. Palate elevates symmetrically  and tongue is midline.  MOTOR:  Motor strength is 5/5 in proximal muscles. Tone is normal throughout.  Mild pitting edema of the legs.  Left great toe with mild erythema, no warm or edema.     Data: XR lumbar spine 06/07/2012:  Curvature convex to the left. Worsening of degenerative disc disease at T12-L1, L1-2 and L4-5.   Labs 12/06/2012:  TSH 5.43 Labs 12/15/2012:  Vitamin B12 426, MMA 0.22, ceruloplasmin 29, copper 114 SPEP/UPEP with IFE - no monoclonal gammopathy Labs 01/16/2013:  HbA1c 5.9  EMG 01/15/2013:  Taken together, these findings are consistent with a moderate generalized, large fiber, sensorimotor polyneuropathy affecting the left side. There is a superimposed intraspinal canal lesion (i.e. radiculopathy) affecting the L2-L4 nerve root/segments bilaterally, moderate in degree electrically.  CT brain 10/15/2013: No skull fracture or intracranial hemorrhage. Fluid level within the right maxillary sinus may be related to sinus  disease without secondary findings of orbital injury. If orbital injury were of clinical concern, CT orbits recommended for  further  delineation. Prominent small vessel disease type changes without CT evidence of large acute infarct.  Mild global atrophy without hydrocephalus.  MRI/A Brain 07/03/2014: 1. Acute nonhemorrhagic 21 mm white matter infarct of the posterior right frontal lobe. 2. Extensive atrophy and diffuse white matter disease corresponds to advanced chronic microvascular ischemia. 3. Moderate to high-grade stenosis of the infra posterior right M2 branch. 4. Moderate small vessel disease is evident on the MRA. 5. High-grade stenosis in the proximal left P2 segment.  US carotids 07/04/2014:  1-39% ICA stenosis bilaterally TTE 07/04/2014:  Within normal limits, no thrombus  IMPRESSION/PLAN:   1.  New onset migraine headaches MRI/A brain from April 2016 showed acute right posterior frontal stroke and intracranial stenosis R M2 (high grade) and proximal left P2 (high grade) Responded will to tramadol 50mg  in the emergency department, so will send Rx tramadol 50mg  q12h prn Consider steroid taper if no improving She is not interested in increasing nortriptyline (taking 25mg  qhs) Patient was told she cannot take NSAIDs   2.  Recent ischemic stroke manifesting with slurred speech due to intracranial stenosis MRI/A brain from April 2016 showed acute right posterior frontal stroke and intracranial stenosis R M2 (high grade) and proximal left P2 (high grade) Continue plavix 75mg  and pravastatin 20mg  (LDL 77) Stroke warning signs discussed   3. Return to clinic in 4-weeks   The duration of this appointment visit was 25 minutes of face-to-face time with the patient.  Greater than 50% of this time was spent in counseling, explanation of diagnosis, planning of further management, and coordination of care.   Thank you for allowing me to participate in patient's care.  If I can answer any additional questions, I would be pleased to do so.    Sincerely,    Chaos Carlile K. Posey Pronto, DO

## 2014-09-02 NOTE — Telephone Encounter (Signed)
I called Sarah [name] back and she really wants patient to be seen earlier.  Informed her that Dr. Posey Pronto has patient's already scheduled but that I would ask and call her back.

## 2014-09-02 NOTE — Telephone Encounter (Signed)
Sarah [name] called back and patient is coming in at 3:00.

## 2014-09-02 NOTE — Telephone Encounter (Signed)
Attempted to call [name] back to let her know that I can give patient a shot of Toradol but Dr. Posey Pronto will not be able to see patient until 3:00.  Left message for her to call me back.

## 2014-09-02 NOTE — Patient Instructions (Addendum)
OK to take tramadol 50mg  every 12 hours as needed for migraine

## 2014-09-02 NOTE — Telephone Encounter (Signed)
I called patient and she said that she is still having a terrible migraine.  She would like to be seen.  We have an opening at 3:00.  Sarah [name] is going to call me back to set it up if she can bring her then.

## 2014-09-03 ENCOUNTER — Telehealth: Payer: Self-pay | Admitting: *Deleted

## 2014-09-03 DIAGNOSIS — R279 Unspecified lack of coordination: Secondary | ICD-10-CM | POA: Diagnosis not present

## 2014-09-03 DIAGNOSIS — F329 Major depressive disorder, single episode, unspecified: Secondary | ICD-10-CM | POA: Diagnosis not present

## 2014-09-03 DIAGNOSIS — M47897 Other spondylosis, lumbosacral region: Secondary | ICD-10-CM | POA: Diagnosis not present

## 2014-09-03 DIAGNOSIS — I69320 Aphasia following cerebral infarction: Secondary | ICD-10-CM | POA: Diagnosis not present

## 2014-09-03 DIAGNOSIS — I10 Essential (primary) hypertension: Secondary | ICD-10-CM | POA: Diagnosis not present

## 2014-09-03 DIAGNOSIS — M6281 Muscle weakness (generalized): Secondary | ICD-10-CM | POA: Diagnosis not present

## 2014-09-03 NOTE — Telephone Encounter (Signed)
Per note 5.13.16

## 2014-09-03 NOTE — Telephone Encounter (Signed)
[facility] - Client [facility] Patient Name: Sarah [name] Gender: Female DOB: 02/02/1988 Age: 81 Y 79 M 25 D Return Phone Number: [phone] (Primary) Address: City/State/Zip: [place] Client [facility] - Client Client Site [facility] [place], [place] Type Call Call Type Triage / Clinical Caller Name Loa Socks Relationship To Patient Spouse Appointment Disposition EMR Appointment Not Necessary Info pasted into Epic Yes Return Phone Number [phone] (Primary) Chief Complaint Abdominal Pain Initial Comment Caller states wife is having stomach issues PreDisposition Go to ED Nurse Assessment Nurse: Vallery Sa, RN, Tye [place] Date/Time (Eastern Time): 08/28/2014 10:56:10 AM Confirm and document reason for call. If symptomatic, describe symptoms. ---Caller states his wife developed lower right abdominal pain last night. She developed fever last night (estimates her temperature to be about 100.0 by touch).No injury in the past 3 days. She developed vomiting and diarrhea last night. Her abdominal pain is persistent even after vomiting or diarrhea. Has the patient traveled out of the country within the last 30 days? ---No Does the patient require triage? ---Yes Related visit to physician within the last 2 weeks? ---No Does the PT have any chronic conditions? (i.e. diabetes, asthma, etc.) ---No Did the patient indicate they were pregnant? ---No Guidelines Guideline Title Affirmed Question Affirmed Notes Nurse Date/Time Eilene Ghazi Time) Abdominal Pain - Female [1] SEVERE pain (e.g., excruciating) AND [2] present > 1 hour [place], RN, Cathy 08/28/2014 11:00:15 AM Disp. Time Eilene Ghazi Time) Disposition Final User 08/28/2014 11:03:39 AM Go to ED Now Yes Vallery Sa, RN, Tye [place]

## 2014-09-04 ENCOUNTER — Telehealth: Payer: Self-pay | Admitting: Neurology

## 2014-09-04 ENCOUNTER — Other Ambulatory Visit: Payer: Self-pay | Admitting: *Deleted

## 2014-09-04 DIAGNOSIS — Z9181 History of falling: Secondary | ICD-10-CM | POA: Diagnosis not present

## 2014-09-04 DIAGNOSIS — F329 Major depressive disorder, single episode, unspecified: Secondary | ICD-10-CM | POA: Diagnosis not present

## 2014-09-04 DIAGNOSIS — F419 Anxiety disorder, unspecified: Secondary | ICD-10-CM | POA: Diagnosis not present

## 2014-09-04 DIAGNOSIS — I6931 Cognitive deficits following cerebral infarction: Secondary | ICD-10-CM | POA: Diagnosis not present

## 2014-09-04 DIAGNOSIS — I69320 Aphasia following cerebral infarction: Secondary | ICD-10-CM | POA: Diagnosis not present

## 2014-09-04 DIAGNOSIS — M6281 Muscle weakness (generalized): Secondary | ICD-10-CM | POA: Diagnosis not present

## 2014-09-04 DIAGNOSIS — I69998 Other sequelae following unspecified cerebrovascular disease: Secondary | ICD-10-CM | POA: Diagnosis not present

## 2014-09-04 DIAGNOSIS — I2510 Atherosclerotic heart disease of native coronary artery without angina pectoris: Secondary | ICD-10-CM | POA: Diagnosis not present

## 2014-09-04 DIAGNOSIS — I10 Essential (primary) hypertension: Secondary | ICD-10-CM | POA: Diagnosis not present

## 2014-09-04 MED ORDER — PREDNISONE 10 MG PO TABS: ORAL_TABLET | ORAL | Status: DC

## 2014-09-04 NOTE — Telephone Encounter (Signed)
Called Rita back.  Left message for her to call me.

## 2014-09-04 NOTE — Telephone Encounter (Signed)
I spoke with Sarah [name] and she said that patient is still having a bad headache.  She rates it around a 5 until she moves around and then it goes to an 8.  Please advise.

## 2014-09-04 NOTE — Telephone Encounter (Signed)
Sarah [name] called and states that the medication only got her headache to a 5 and she wants to know what she can do for Sarah [name] please call [phone]

## 2014-09-04 NOTE — Telephone Encounter (Signed)
Let's do prednisone taper starting at 60mg  and reduce by 10mg  each day over one week. If agreeable, please send Rx prednisone 10mg  take 6 tab on first day, then 5, then 4, then 3, then 2, then 1. #22, 0 refills.

## 2014-09-04 NOTE — Telephone Encounter (Signed)
Called [name] back and left message that I will be sending Rx for the prednisone.  Rx sent to [facility].

## 2014-09-05 NOTE — Telephone Encounter (Signed)
Called back and left message for patient to call me back.

## 2014-09-05 NOTE — Telephone Encounter (Signed)
Sarah [name] is returning your call please call [phone]

## 2014-09-05 NOTE — Telephone Encounter (Signed)
Sarah [name] called back and said that she did not get my message.  I informed her that I sent in a prescription for prednisone per Dr. Posey Pronto.  Sarah [name] will pick up today and have patient start med.

## 2014-09-09 DIAGNOSIS — M6281 Muscle weakness (generalized): Secondary | ICD-10-CM | POA: Diagnosis not present

## 2014-09-09 DIAGNOSIS — I69320 Aphasia following cerebral infarction: Secondary | ICD-10-CM | POA: Diagnosis not present

## 2014-09-09 DIAGNOSIS — I2510 Atherosclerotic heart disease of native coronary artery without angina pectoris: Secondary | ICD-10-CM | POA: Diagnosis not present

## 2014-09-09 DIAGNOSIS — I69998 Other sequelae following unspecified cerebrovascular disease: Secondary | ICD-10-CM | POA: Diagnosis not present

## 2014-09-09 DIAGNOSIS — I10 Essential (primary) hypertension: Secondary | ICD-10-CM | POA: Diagnosis not present

## 2014-09-09 DIAGNOSIS — I6931 Cognitive deficits following cerebral infarction: Secondary | ICD-10-CM | POA: Diagnosis not present

## 2014-09-11 ENCOUNTER — Other Ambulatory Visit: Payer: Self-pay | Admitting: Internal Medicine

## 2014-09-11 DIAGNOSIS — M6281 Muscle weakness (generalized): Secondary | ICD-10-CM | POA: Diagnosis not present

## 2014-09-11 DIAGNOSIS — I6931 Cognitive deficits following cerebral infarction: Secondary | ICD-10-CM | POA: Diagnosis not present

## 2014-09-11 DIAGNOSIS — I10 Essential (primary) hypertension: Secondary | ICD-10-CM | POA: Diagnosis not present

## 2014-09-11 DIAGNOSIS — I69320 Aphasia following cerebral infarction: Secondary | ICD-10-CM | POA: Diagnosis not present

## 2014-09-11 DIAGNOSIS — I2510 Atherosclerotic heart disease of native coronary artery without angina pectoris: Secondary | ICD-10-CM | POA: Diagnosis not present

## 2014-09-11 DIAGNOSIS — I69998 Other sequelae following unspecified cerebrovascular disease: Secondary | ICD-10-CM | POA: Diagnosis not present

## 2014-09-11 NOTE — Telephone Encounter (Signed)
MD out of office. Is this ok to refill.../lmb 

## 2014-09-11 NOTE — Telephone Encounter (Signed)
Done hardcopy to Dahlia  

## 2014-09-12 NOTE — Telephone Encounter (Signed)
Rx faxed to pharmacy  

## 2014-09-16 DIAGNOSIS — I2510 Atherosclerotic heart disease of native coronary artery without angina pectoris: Secondary | ICD-10-CM | POA: Diagnosis not present

## 2014-09-16 DIAGNOSIS — I69320 Aphasia following cerebral infarction: Secondary | ICD-10-CM | POA: Diagnosis not present

## 2014-09-16 DIAGNOSIS — I6931 Cognitive deficits following cerebral infarction: Secondary | ICD-10-CM | POA: Diagnosis not present

## 2014-09-16 DIAGNOSIS — I69998 Other sequelae following unspecified cerebrovascular disease: Secondary | ICD-10-CM | POA: Diagnosis not present

## 2014-09-16 DIAGNOSIS — M6281 Muscle weakness (generalized): Secondary | ICD-10-CM | POA: Diagnosis not present

## 2014-09-16 DIAGNOSIS — I10 Essential (primary) hypertension: Secondary | ICD-10-CM | POA: Diagnosis not present

## 2014-09-17 ENCOUNTER — Encounter: Payer: Self-pay | Admitting: Internal Medicine

## 2014-09-17 ENCOUNTER — Ambulatory Visit (INDEPENDENT_AMBULATORY_CARE_PROVIDER_SITE_OTHER): Payer: Medicare Other | Admitting: Internal Medicine

## 2014-09-17 VITALS — BP 127/64 | HR 93 | Temp 98.1°F | Resp 12 | Ht 60.0 in | Wt 160.0 lb

## 2014-09-17 DIAGNOSIS — E871 Hypo-osmolality and hyponatremia: Secondary | ICD-10-CM | POA: Diagnosis not present

## 2014-09-17 DIAGNOSIS — Z0000 Encounter for general adult medical examination without abnormal findings: Secondary | ICD-10-CM | POA: Diagnosis not present

## 2014-09-17 DIAGNOSIS — E7800 Pure hypercholesterolemia, unspecified: Secondary | ICD-10-CM

## 2014-09-17 DIAGNOSIS — Z23 Encounter for immunization: Secondary | ICD-10-CM | POA: Diagnosis not present

## 2014-09-17 DIAGNOSIS — E538 Deficiency of other specified B group vitamins: Secondary | ICD-10-CM

## 2014-09-17 MED ORDER — CYANOCOBALAMIN 1000 MCG/ML IJ SOLN
1000.0000 ug | Freq: Once | INTRAMUSCULAR | Status: AC
  Administered 2014-09-17: 1000 ug via INTRAMUSCULAR

## 2014-09-17 NOTE — Assessment & Plan Note (Signed)
Review last several years of labs and appears to be chronic. Advised to drink to thirst and not force liquids but not limit.

## 2014-09-17 NOTE — Patient Instructions (Signed)
You can stop taking the once a week medicine called ibandronate (boniva) as you have been on it long enough.   We do not need to check any blood work today.  We have given you the pneumonia booster shot.  Health Maintenance Adopting a healthy lifestyle and getting preventive care can go a long way to promote health and wellness. Talk with your health care provider about what schedule of regular examinations is right for you. This is a good chance for you to check in with your provider about disease prevention and staying healthy. In between checkups, there are plenty of things you can do on your own. Experts have done a lot of research about which lifestyle changes and preventive measures are most likely to keep you healthy. Ask your health care provider for more information. WEIGHT AND DIET  Eat a healthy diet  Be sure to include plenty of vegetables, fruits, low-fat dairy products, and lean protein.  Do not eat a lot of foods high in solid fats, added sugars, or salt.  Get regular exercise. This is one of the most important things you can do for your health.  Most adults should exercise for at least 150 minutes each week. The exercise should increase your heart rate and make you sweat (moderate-intensity exercise).  Most adults should also do strengthening exercises at least twice a week. This is in addition to the moderate-intensity exercise.  Maintain a healthy weight  Body mass index (BMI) is a measurement that can be used to identify possible weight problems. It estimates body fat based on height and weight. Your health care provider can help determine your BMI and help you achieve or maintain a healthy weight.  For females 13 years of age and older:   A BMI below 18.5 is considered underweight.  A BMI of 18.5 to 24.9 is normal.  A BMI of 25 to 29.9 is considered overweight.  A BMI of 30 and above is considered obese.  Watch levels of cholesterol and blood lipids  You  should start having your blood tested for lipids and cholesterol at 79 years of age, then have this test every 5 years.  You may need to have your cholesterol levels checked more often if:  Your lipid or cholesterol levels are high.  You are older than 79 years of age.  You are at high risk for heart disease.  CANCER SCREENING   Lung Cancer  Lung cancer screening is recommended for adults 80-40 years old who are at high risk for lung cancer because of a history of smoking.  A yearly low-dose CT scan of the lungs is recommended for people who:  Currently smoke.  Have quit within the past 15 years.  Have at least a 30-pack-year history of smoking. A pack year is smoking an average of one pack of cigarettes a day for 1 year.  Yearly screening should continue until it has been 15 years since you quit.  Yearly screening should stop if you develop a health problem that would prevent you from having lung cancer treatment.  Breast Cancer  Practice breast self-awareness. This means understanding how your breasts normally appear and feel.  It also means doing regular breast self-exams. Let your health care provider know about any changes, no matter how small.  If you are in your 20s or 30s, you should have a clinical breast exam (CBE) by a health care provider every 1-3 years as part of a regular health exam.  If  you are 40 or older, have a CBE every year. Also consider having a breast X-ray (mammogram) every year.  If you have a family history of breast cancer, talk to your health care provider about genetic screening.  If you are at high risk for breast cancer, talk to your health care provider about having an MRI and a mammogram every year.  Breast cancer gene (BRCA) assessment is recommended for women who have family members with BRCA-related cancers. BRCA-related cancers include:  Breast.  Ovarian.  Tubal.  Peritoneal cancers.  Results of the assessment will determine  the need for genetic counseling and BRCA1 and BRCA2 testing. Cervical Cancer Routine pelvic examinations to screen for cervical cancer are no longer recommended for nonpregnant women who are considered low risk for cancer of the pelvic organs (ovaries, uterus, and vagina) and who do not have symptoms. A pelvic examination may be necessary if you have symptoms including those associated with pelvic infections. Ask your health care provider if a screening pelvic exam is right for you.   The Pap test is the screening test for cervical cancer for women who are considered at risk.  If you had a hysterectomy for a problem that was not cancer or a condition that could lead to cancer, then you no longer need Pap tests.  If you are older than 65 years, and you have had normal Pap tests for the past 10 years, you no longer need to have Pap tests.  If you have had past treatment for cervical cancer or a condition that could lead to cancer, you need Pap tests and screening for cancer for at least 20 years after your treatment.  If you no longer get a Pap test, assess your risk factors if they change (such as having a new sexual partner). This can affect whether you should start being screened again.  Some women have medical problems that increase their chance of getting cervical cancer. If this is the case for you, your health care provider may recommend more frequent screening and Pap tests.  The human papillomavirus (HPV) test is another test that may be used for cervical cancer screening. The HPV test looks for the virus that can cause cell changes in the cervix. The cells collected during the Pap test can be tested for HPV.  The HPV test can be used to screen women 55 years of age and older. Getting tested for HPV can extend the interval between normal Pap tests from three to five years.  An HPV test also should be used to screen women of any age who have unclear Pap test results.  After 79 years of  age, women should have HPV testing as often as Pap tests.  Colorectal Cancer  This type of cancer can be detected and often prevented.  Routine colorectal cancer screening usually begins at 79 years of age and continues through 79 years of age.  Your health care provider may recommend screening at an earlier age if you have risk factors for colon cancer.  Your health care provider may also recommend using home test kits to check for hidden blood in the stool.  A small camera at the end of a tube can be used to examine your colon directly (sigmoidoscopy or colonoscopy). This is done to check for the earliest forms of colorectal cancer.  Routine screening usually begins at age 14.  Direct examination of the colon should be repeated every 5-10 years through 79 years of age. However, you  may need to be screened more often if early forms of precancerous polyps or small growths are found. Skin Cancer  Check your skin from head to toe regularly.  Tell your health care provider about any new moles or changes in moles, especially if there is a change in a mole's shape or color.  Also tell your health care provider if you have a mole that is larger than the size of a pencil eraser.  Always use sunscreen. Apply sunscreen liberally and repeatedly throughout the day.  Protect yourself by wearing long sleeves, pants, a wide-brimmed hat, and sunglasses whenever you are outside. HEART DISEASE, DIABETES, AND HIGH BLOOD PRESSURE   Have your blood pressure checked at least every 1-2 years. High blood pressure causes heart disease and increases the risk of stroke.  If you are between 19 years and 68 years old, ask your health care provider if you should take aspirin to prevent strokes.  Have regular diabetes screenings. This involves taking a blood sample to check your fasting blood sugar level.  If you are at a normal weight and have a low risk for diabetes, have this test once every three years  after 79 years of age.  If you are overweight and have a high risk for diabetes, consider being tested at a younger age or more often. PREVENTING INFECTION  Hepatitis B  If you have a higher risk for hepatitis B, you should be screened for this virus. You are considered at high risk for hepatitis B if:  You were born in a country where hepatitis B is common. Ask your health care provider which countries are considered high risk.  Your parents were born in a high-risk country, and you have not been immunized against hepatitis B (hepatitis B vaccine).  You have HIV or AIDS.  You use needles to inject street drugs.  You live with someone who has hepatitis B.  You have had sex with someone who has hepatitis B.  You get hemodialysis treatment.  You take certain medicines for conditions, including cancer, organ transplantation, and autoimmune conditions. Hepatitis C  Blood testing is recommended for:  Everyone born from [birth year] through 1963.  Anyone with known risk factors for hepatitis C. Sexually transmitted infections (STIs)  You should be screened for sexually transmitted infections (STIs) including gonorrhea and chlamydia if:  You are sexually active and are younger than 79 years of age.  You are older than 79 years of age and your health care provider tells you that you are at risk for this type of infection.  Your sexual activity has changed since you were last screened and you are at an increased risk for chlamydia or gonorrhea. Ask your health care provider if you are at risk.  If you do not have HIV, but are at risk, it may be recommended that you take a prescription medicine daily to prevent HIV infection. This is called pre-exposure prophylaxis (PrEP). You are considered at risk if:  You are sexually active and do not regularly use condoms or know the HIV status of your partner(s).  You take drugs by injection.  You are sexually active with a partner who has  HIV. Talk with your health care provider about whether you are at high risk of being infected with HIV. If you choose to begin PrEP, you should first be tested for HIV. You should then be tested every 3 months for as long as you are taking PrEP.  PREGNANCY   If you  are premenopausal and you may become pregnant, ask your health care provider about preconception counseling.  If you may become pregnant, take 400 to 800 micrograms (mcg) of folic acid every day.  If you want to prevent pregnancy, talk to your health care provider about birth control (contraception). OSTEOPOROSIS AND MENOPAUSE   Osteoporosis is a disease in which the bones lose minerals and strength with aging. This can result in serious bone fractures. Your risk for osteoporosis can be identified using a bone density scan.  If you are 38 years of age or older, or if you are at risk for osteoporosis and fractures, ask your health care provider if you should be screened.  Ask your health care provider whether you should take a calcium or vitamin D supplement to lower your risk for osteoporosis.  Menopause may have certain physical symptoms and risks.  Hormone replacement therapy may reduce some of these symptoms and risks. Talk to your health care provider about whether hormone replacement therapy is right for you.  HOME CARE INSTRUCTIONS   Schedule regular health, dental, and eye exams.  Stay current with your immunizations.   Do not use any tobacco products including cigarettes, chewing tobacco, or electronic cigarettes.  If you are pregnant, do not drink alcohol.  If you are breastfeeding, limit how much and how often you drink alcohol.  Limit alcohol intake to no more than 1 drink per day for nonpregnant women. One drink equals 12 ounces of beer, 5 ounces of wine, or 1 ounces of hard liquor.  Do not use street drugs.  Do not share needles.  Ask your health care provider for help if you need support or  information about quitting drugs.  Tell your health care provider if you often feel depressed.  Tell your health care provider if you have ever been abused or do not feel safe at home. Document Released: 09/28/2010 Document Revised: 07/30/2013 Document Reviewed: 02/14/2013 [facility] Patient Information 2015 [place], [place]. This information is not intended to replace advice given to you by your health care provider. Make sure you discuss any questions you have with your health care provider.

## 2014-09-17 NOTE — Assessment & Plan Note (Signed)
Last LDL at goal, BP okay today. No new stroke symptoms and continue to monitor. Some unsteadiness and Pt/OT still working with her.

## 2014-09-17 NOTE — Progress Notes (Signed)
Pre visit review using our clinic review tool, if applicable. No additional management support is needed unless otherwise documented below in the visit note. 

## 2014-09-17 NOTE — Progress Notes (Signed)
   Subjective:    Patient ID: Sarah [name], female    DOB: 15-May-1926, 81 y.o.   MRN: [id number]  HPI Here for medicare wellness, no new complaints. Please see A/P for status and treatment of chronic medical problems.   Diet: heart healthy Physical activity: sedentary Depression/mood screen: negative Hearing: intact to whispered voice Visual acuity: corrects to normal with glasses ADLs: capable with help  Fall risk: low-medium Home safety: fair Cognitive evaluation: intact to orientation, naming, recall and repetition EOL planning: adv directives discussed  I have personally reviewed and have noted 1. The patient's medical and social history - reviewed today no changes 2. Their use of alcohol, tobacco or illicit drugs 3. Their current medications and supplements 4. The patient's functional ability including ADL's, fall risks, home safety risks and hearing or visual impairment. 5. Diet and physical activities 6. Evidence for depression or mood disorders 7. Care team reviewed and updated (available in snapshot)  Review of Systems  Constitutional: Negative for fever, activity change, appetite change and fatigue.  HENT: Negative.   Eyes: Negative.   Respiratory: Negative for cough, chest tightness, shortness of breath and wheezing.   Cardiovascular: Negative for chest pain, palpitations and leg swelling.  Gastrointestinal: Positive for abdominal distention. Negative for abdominal pain, diarrhea and constipation.  Musculoskeletal: Negative for myalgias and arthralgias.  Skin: Negative.   Neurological: Positive for weakness and numbness. Negative for dizziness.  Psychiatric/Behavioral: Negative.       Objective:   Physical Exam  Constitutional: She is oriented to person, place, and time. She appears well-developed and well-nourished.  HENT:  Head: Normocephalic and atraumatic.  Right Ear: External ear normal.  Left Ear: External ear normal.  Eyes: EOM are normal.  Neck:  Normal range of motion.  Cardiovascular: Normal rate and regular rhythm.   Pulmonary/Chest: Effort normal and breath sounds normal. No respiratory distress. She has no wheezes.  Abdominal: Soft. She exhibits no distension. There is no tenderness. There is no rebound.  Musculoskeletal: She exhibits no edema.  Neurological: She is alert and oriented to person, place, and time.  Skin: Skin is warm and dry.  Decreased pulses in the feet, decreased sensation  Psychiatric: She has a normal mood and affect.   Filed Vitals:   09/17/14 1315  BP: 127/64  Pulse: 93  Temp: 98.1 F (36.7 C)  TempSrc: Oral  Resp: 12  Height: 5' (1.524 m)  Weight: 160 lb (72.576 kg)  SpO2: 95%      Assessment & Plan:  Prevnar given at visit, B12 administered during visit as well

## 2014-09-17 NOTE — Assessment & Plan Note (Signed)
10 year health screening given to patient. Prevnar given to patient to complete pneumonia series. Colonoscopy overdue but aged out of screening. Non-smoker but not exercising regularly. Working with Pt/OT and advised her to increase her exercise level.

## 2014-09-17 NOTE — Assessment & Plan Note (Signed)
Has been on boniva since 2010 per notes. She confirms that she has been on many years. Will stop due to lack of benefit and to decrease pill burden.

## 2014-09-18 ENCOUNTER — Encounter: Payer: Medicare Other | Admitting: Internal Medicine

## 2014-09-20 ENCOUNTER — Encounter: Payer: Self-pay | Admitting: Neurology

## 2014-09-20 ENCOUNTER — Ambulatory Visit (INDEPENDENT_AMBULATORY_CARE_PROVIDER_SITE_OTHER): Payer: Medicare Other | Admitting: Neurology

## 2014-09-20 VITALS — BP 134/70 | HR 88 | Wt 161.0 lb

## 2014-09-20 DIAGNOSIS — G608 Other hereditary and idiopathic neuropathies: Secondary | ICD-10-CM | POA: Diagnosis not present

## 2014-09-20 DIAGNOSIS — R0789 Other chest pain: Secondary | ICD-10-CM

## 2014-09-20 DIAGNOSIS — I639 Cerebral infarction, unspecified: Secondary | ICD-10-CM

## 2014-09-20 DIAGNOSIS — R269 Unspecified abnormalities of gait and mobility: Secondary | ICD-10-CM

## 2014-09-20 NOTE — Patient Instructions (Signed)
Return to clinic in 3-4 months. 

## 2014-09-20 NOTE — Progress Notes (Signed)
[facility]  Follow-up Visit   Date: 09/20/2014   Sarah [name] MRN: [id number] DOB: 07-01-1926   Interim History: Sarah [name] is a 81 y.o. right-handed Caucasian female with history of HTN, HPL, depression, esophageal strictures, and GERD returning to the clinic for follow-up of multifactorial gait instability, headaches, and stroke.  The patient was accompanied to the clinic by her daughter, [name].  History of present illness: Leg weakness started slowly around May 2014 when she noticed difficulty with walking and tripping on things. She would stumble frequently, and denies any falls.  She has associated bilateral achy knee pain which feels tight and is worse when she applies pressure on it. She reports having chronic back pain but had worsening pain in August 2014. Pain is achy located at the low back and does not radiate into her buttocks or legs. Forward flexion makes her pain worse and it is improved by laying down.   Follow-up 01/16/2013:  Neuropathy labs which all returned normal.  EMG was performed and shows moderate peripheral neuropathy of the legs and L3-L4 old radiculopathy.   Follow-up 04/18/2013:  In December, she saw Dr. Tamala Julian for back pain who found SI joint dysfunction and performed injection which some benefit. Because of ongoing leg weakness, she started using a cane in November 2014 and feels much more stable.  She also has a caregiver that comes 10-2p who helps her dress, prepare meals, and does her errands which she appreciates a lot.    Follow-up 10/09/2013:  She had an unwittnessed fall on 6/17 while transferring a few things to her dining table.  She hit the back of her head and bruised her right wrist.  Caregiver says that she is more forgetful than usual, especially with conversation.  Since her fall, she has had intermittent headaches, described as achy, occuring daily and last all day. It usually responds to tylenol.    Follow-up  11/20/2013:  She developed severe right foot pain and she podiatry who injected her foot, which helped slightly. She applies icey hot to her feet which helps.   Follow-up 02/12/2014:  She continues to have of achy knee and foot pain and reports worsening low back pain.   She uses voltaren and tumeric which significantly helps.   No tingling, besides mild tingling involving the base of her foot. No interval falls, but does endorse worsening imbalance.  UPDATE 05/20/2014:  She completed PT and reports being able to climb stairs which was a huge improvement.  No falls or hospitalizations.  She continues to have balance problems and is very cautious when walking.  Denies any shooting, stabbing, or burning pain. She has musculoskeletal pain involving her right upper arm and feet, which responds to voltaren.  UPDATE 09/02/2014:  Patient had an interval hospitalization for acute stroke manifested with slurred speech which has since resolved.  She called for same-day appointment because of severe bifrontal headache since last week.  She went to the ED 6/2 where tramadol was given and provided relief. She is getting home PT and feels that she may have over exerted herself lately which could have triggered her migraine.  There is significant photophobia and phonophobia.  No nasuea or vomiting.  No other associated neurological symptoms.  UPDATE 09/20/2014:  She is so relieved that her headaches improved with prednisone.  She is doing well and has not had any further headaches, slurred speech, or falls.  She is complaining of new chest wall pain, especially when  she reaches for objects.  There is mild tenderness if she pushes on her sternum.  Medications:  Current Outpatient Prescriptions on File Prior to Visit  Medication Sig Dispense Refill  . acetaminophen (TYLENOL) 500 MG tablet Take 1 tablet (500 mg total) by mouth every 8 (eight) hours as needed (for pain). 180 tablet 2  . ALPRAZolam (XANAX) 1 MG tablet TAKE 1  TABLET TWICE DAILY. 60 tablet 1  . Cholecalciferol (VITAMIN D PO) Take 1 tablet by mouth daily.     . clobetasol ointment (TEMOVATE) 2.59 % Apply 1 application topically 2 (two) times daily.     . clopidogrel (PLAVIX) 75 MG tablet TAKE 1 TABLET ONCE DAILY. 30 tablet 6  . cyanocobalamin (,VITAMIN B-12,) 1000 MCG/ML injection INJECT 1 ML INTO THE MUSCLE EVERY 30 DAYS. 10 mL 5  . diclofenac sodium (VOLTAREN) 1 % GEL Apply  To feet 3 times daily prn for pain 1 Tube 5  . furosemide (LASIX) 20 MG tablet Take 20 mg by mouth daily as needed (for swelling).    . Glucosamine HCl 750 MG TABS Take 1 tablet by mouth 2 (two) times daily.     . Misc Natural Products (TUMERSAID) TABS Take 1 capsule by mouth 2 (two) times daily. 60 tablet 3  . nitroGLYCERIN (NITROSTAT) 0.4 MG SL tablet Place 1 tablet (0.4 mg total) under the tongue every 5 (five) minutes as needed for chest pain. 100 tablet prn  . nortriptyline (PAMELOR) 25 MG capsule Take 25 mg by mouth at bedtime.    . pantoprazole (PROTONIX) 40 MG tablet Take 40 mg by mouth daily.   2  . pravastatin (PRAVACHOL) 20 MG tablet 1/2 qd 30 tablet 1  . solifenacin (VESICARE) 10 MG tablet Take 10 mg by mouth daily as needed (to reduce number of bathroom visits during travel).    . triazolam (HALCION) 0.25 MG tablet Take 0.5 tablets (0.125 mg total) by mouth at bedtime as needed for sleep. 90 tablet 0   No current facility-administered medications on file prior to visit.    Allergies:  Allergies  Allergen Reactions  . Celebrex [Celecoxib]     rash  . Lipitor [Atorvastatin Calcium]     Leg weakness,memory loss  . Lovastatin     Leg weakness  . Other     Always pads  . Latex Rash     Review of Systems:  CONSTITUTIONAL: No fevers, chills, night sweats, or weight loss.   EYES: No visual changes or eye pain ENT: No hearing changes.  No history of nose bleeds. RESPIRATORY: No cough, wheezing and shortness of breath.   CARDIOVASCULAR: Negative for chest  pain, and palpitations.   GI: Negative for abdominal discomfort, blood in stools or black stools.  No recent change in bowel habits.  GU:  No history of incontinence.   MUSCLOSKELETAL: No history of joint pain or swelling.  No myalgias.  + back pain SKIN: Negative for lesions, rash, and itching.   HEMATOLOGY/ONCOLOGY: Negative for prolonged bleeding, bruising easily, and swollen nodes.   ENDOCRINE: Negative for cold or heat intolerance, polydipsia or goiter.   PSYCH:  No depression or anxiety symptoms.   NEURO: As Above.   Vital Signs:  BP 134/70 mmHg  Pulse 88  Wt 161 lb (73.029 kg)  SpO2 99%  Neurological Exam: MENTAL STATUS including orientation to time, place, person, recent and remote memory is fairly intact.  Slightly blunted affect. No dysarthria. Repetition intact  CRANIAL NERVES:  Pupils round and  reactive to light. Extraocular muscles are intact. Face is symmetric. Palate elevates symmetrically and tongue is midline.  MOTOR:  Motor strength is 5/5 in proximal muscles. Tone is normal throughout.    SENSORY:  Absent vibration distal to ankles   Data: XR lumbar spine 06/07/2012:  Curvature convex to the left. Worsening of degenerative disc disease at T12-L1, L1-2 and L4-5.   Labs 12/06/2012:  TSH 5.43 Labs 12/15/2012:  Vitamin B12 426, MMA 0.22, ceruloplasmin 29, copper 114 SPEP/UPEP with IFE - no monoclonal gammopathy Labs 01/16/2013:  HbA1c 5.9  EMG 01/15/2013:  Taken together, these findings are consistent with a moderate generalized, large fiber, sensorimotor polyneuropathy affecting the left side. There is a superimposed intraspinal canal lesion (i.e. radiculopathy) affecting the L2-L4 nerve root/segments bilaterally, moderate in degree electrically.  CT brain 10/15/2013: No skull fracture or intracranial hemorrhage. Fluid level within the right maxillary sinus may be related to sinus  disease without secondary findings of orbital injury. If orbital injury were of  clinical concern, CT orbits recommended for further  delineation. Prominent small vessel disease type changes without CT evidence of large acute infarct.  Mild global atrophy without hydrocephalus.  MRI/A Brain 07/03/2014: 1. Acute nonhemorrhagic 21 mm white matter infarct of the posterior right frontal lobe. 2. Extensive atrophy and diffuse white matter disease corresponds to advanced chronic microvascular ischemia. 3. Moderate to high-grade stenosis of the infra posterior right M2 branch. 4. Moderate small vessel disease is evident on the MRA. 5. High-grade stenosis in the proximal left P2 segment.  US carotids 07/04/2014:  1-39% ICA stenosis bilaterally TTE 07/04/2014:  Within normal limits, no thrombus   IMPRESSION/PLAN:   1.  New onset migraine headaches - resolved with prednisone taper pack MRI/A brain from April 2016 showed acute right posterior frontal stroke and intracranial stenosis R M2 (high grade) and proximal left P2 (high grade) Patient was told she cannot take NSAIDs  2.  Costochondritis Recommended tylenol, watching posture If no improvement f/u with PCP  3.  Peripheral neuropathy Continue nortriptyline 25mg  qhs   4.  Recent ischemic stroke manifesting with slurred speech due to intracranial stenosis MRI/A brain from April 2016 showed acute right posterior frontal stroke and intracranial stenosis R M2 (high grade) and proximal left P2 (high grade) Continue plavix 75mg  and pravastatin 20mg  (LDL 77) Continue home speech therapy Stroke warning signs discussed  Return to clinic in 48-months   The duration of this appointment visit was 25 minutes of face-to-face time with the patient.  Greater than 50% of this time was spent in counseling, explanation of diagnosis, planning of further management, and coordination of care.   Thank you for allowing me to participate in patient's care.  If I can answer any additional questions, I would be pleased to do so.     Sincerely,    Donika K. Posey Pronto, DO

## 2014-09-24 NOTE — Telephone Encounter (Signed)
Patient bone in chest cavity is hurting (not her heart), please advise

## 2014-09-24 NOTE — Telephone Encounter (Signed)
She declined visit and stated she was going to urgent care. If she is not then please schedule visit for tomorrow with someone.

## 2014-09-25 DIAGNOSIS — M6281 Muscle weakness (generalized): Secondary | ICD-10-CM | POA: Diagnosis not present

## 2014-09-25 DIAGNOSIS — I6931 Cognitive deficits following cerebral infarction: Secondary | ICD-10-CM | POA: Diagnosis not present

## 2014-09-25 DIAGNOSIS — I69320 Aphasia following cerebral infarction: Secondary | ICD-10-CM | POA: Diagnosis not present

## 2014-09-25 DIAGNOSIS — I69998 Other sequelae following unspecified cerebrovascular disease: Secondary | ICD-10-CM | POA: Diagnosis not present

## 2014-09-25 DIAGNOSIS — I10 Essential (primary) hypertension: Secondary | ICD-10-CM | POA: Diagnosis not present

## 2014-09-25 DIAGNOSIS — I2510 Atherosclerotic heart disease of native coronary artery without angina pectoris: Secondary | ICD-10-CM | POA: Diagnosis not present

## 2014-09-27 DIAGNOSIS — I10 Essential (primary) hypertension: Secondary | ICD-10-CM | POA: Diagnosis not present

## 2014-09-27 DIAGNOSIS — I6931 Cognitive deficits following cerebral infarction: Secondary | ICD-10-CM | POA: Diagnosis not present

## 2014-09-27 DIAGNOSIS — M6281 Muscle weakness (generalized): Secondary | ICD-10-CM | POA: Diagnosis not present

## 2014-09-27 DIAGNOSIS — I2510 Atherosclerotic heart disease of native coronary artery without angina pectoris: Secondary | ICD-10-CM | POA: Diagnosis not present

## 2014-09-27 DIAGNOSIS — I69998 Other sequelae following unspecified cerebrovascular disease: Secondary | ICD-10-CM | POA: Diagnosis not present

## 2014-09-27 DIAGNOSIS — I69320 Aphasia following cerebral infarction: Secondary | ICD-10-CM | POA: Diagnosis not present

## 2014-09-30 DIAGNOSIS — I69998 Other sequelae following unspecified cerebrovascular disease: Secondary | ICD-10-CM | POA: Diagnosis not present

## 2014-09-30 DIAGNOSIS — I6931 Cognitive deficits following cerebral infarction: Secondary | ICD-10-CM | POA: Diagnosis not present

## 2014-09-30 DIAGNOSIS — M6281 Muscle weakness (generalized): Secondary | ICD-10-CM | POA: Diagnosis not present

## 2014-09-30 DIAGNOSIS — I69320 Aphasia following cerebral infarction: Secondary | ICD-10-CM | POA: Diagnosis not present

## 2014-09-30 DIAGNOSIS — I2510 Atherosclerotic heart disease of native coronary artery without angina pectoris: Secondary | ICD-10-CM | POA: Diagnosis not present

## 2014-09-30 DIAGNOSIS — I10 Essential (primary) hypertension: Secondary | ICD-10-CM | POA: Diagnosis not present

## 2014-10-01 ENCOUNTER — Other Ambulatory Visit: Payer: Self-pay | Admitting: Internal Medicine

## 2014-10-02 DIAGNOSIS — M6281 Muscle weakness (generalized): Secondary | ICD-10-CM | POA: Diagnosis not present

## 2014-10-02 DIAGNOSIS — I10 Essential (primary) hypertension: Secondary | ICD-10-CM | POA: Diagnosis not present

## 2014-10-02 DIAGNOSIS — I6931 Cognitive deficits following cerebral infarction: Secondary | ICD-10-CM | POA: Diagnosis not present

## 2014-10-02 DIAGNOSIS — I69998 Other sequelae following unspecified cerebrovascular disease: Secondary | ICD-10-CM | POA: Diagnosis not present

## 2014-10-02 DIAGNOSIS — I2510 Atherosclerotic heart disease of native coronary artery without angina pectoris: Secondary | ICD-10-CM | POA: Diagnosis not present

## 2014-10-02 DIAGNOSIS — I69320 Aphasia following cerebral infarction: Secondary | ICD-10-CM | POA: Diagnosis not present

## 2014-10-07 ENCOUNTER — Ambulatory Visit: Payer: Medicare Other | Admitting: Podiatry

## 2014-10-08 DIAGNOSIS — I6931 Cognitive deficits following cerebral infarction: Secondary | ICD-10-CM | POA: Diagnosis not present

## 2014-10-08 DIAGNOSIS — I69998 Other sequelae following unspecified cerebrovascular disease: Secondary | ICD-10-CM | POA: Diagnosis not present

## 2014-10-08 DIAGNOSIS — M6281 Muscle weakness (generalized): Secondary | ICD-10-CM | POA: Diagnosis not present

## 2014-10-08 DIAGNOSIS — I10 Essential (primary) hypertension: Secondary | ICD-10-CM | POA: Diagnosis not present

## 2014-10-08 DIAGNOSIS — I69320 Aphasia following cerebral infarction: Secondary | ICD-10-CM | POA: Diagnosis not present

## 2014-10-08 DIAGNOSIS — I2510 Atherosclerotic heart disease of native coronary artery without angina pectoris: Secondary | ICD-10-CM | POA: Diagnosis not present

## 2014-10-11 DIAGNOSIS — I10 Essential (primary) hypertension: Secondary | ICD-10-CM | POA: Diagnosis not present

## 2014-10-11 DIAGNOSIS — I6931 Cognitive deficits following cerebral infarction: Secondary | ICD-10-CM | POA: Diagnosis not present

## 2014-10-11 DIAGNOSIS — M6281 Muscle weakness (generalized): Secondary | ICD-10-CM | POA: Diagnosis not present

## 2014-10-11 DIAGNOSIS — I2510 Atherosclerotic heart disease of native coronary artery without angina pectoris: Secondary | ICD-10-CM | POA: Diagnosis not present

## 2014-10-11 DIAGNOSIS — I69998 Other sequelae following unspecified cerebrovascular disease: Secondary | ICD-10-CM | POA: Diagnosis not present

## 2014-10-11 DIAGNOSIS — I69320 Aphasia following cerebral infarction: Secondary | ICD-10-CM | POA: Diagnosis not present

## 2014-10-14 DIAGNOSIS — I10 Essential (primary) hypertension: Secondary | ICD-10-CM | POA: Diagnosis not present

## 2014-10-14 DIAGNOSIS — M6281 Muscle weakness (generalized): Secondary | ICD-10-CM | POA: Diagnosis not present

## 2014-10-14 DIAGNOSIS — I2510 Atherosclerotic heart disease of native coronary artery without angina pectoris: Secondary | ICD-10-CM | POA: Diagnosis not present

## 2014-10-14 DIAGNOSIS — I6931 Cognitive deficits following cerebral infarction: Secondary | ICD-10-CM | POA: Diagnosis not present

## 2014-10-14 DIAGNOSIS — I69998 Other sequelae following unspecified cerebrovascular disease: Secondary | ICD-10-CM | POA: Diagnosis not present

## 2014-10-14 DIAGNOSIS — I69320 Aphasia following cerebral infarction: Secondary | ICD-10-CM | POA: Diagnosis not present

## 2014-10-15 DIAGNOSIS — I6931 Cognitive deficits following cerebral infarction: Secondary | ICD-10-CM | POA: Diagnosis not present

## 2014-10-15 DIAGNOSIS — I10 Essential (primary) hypertension: Secondary | ICD-10-CM | POA: Diagnosis not present

## 2014-10-15 DIAGNOSIS — M6281 Muscle weakness (generalized): Secondary | ICD-10-CM | POA: Diagnosis not present

## 2014-10-15 DIAGNOSIS — I69320 Aphasia following cerebral infarction: Secondary | ICD-10-CM | POA: Diagnosis not present

## 2014-10-15 DIAGNOSIS — I2510 Atherosclerotic heart disease of native coronary artery without angina pectoris: Secondary | ICD-10-CM | POA: Diagnosis not present

## 2014-10-15 DIAGNOSIS — I69998 Other sequelae following unspecified cerebrovascular disease: Secondary | ICD-10-CM | POA: Diagnosis not present

## 2014-10-16 DIAGNOSIS — I6931 Cognitive deficits following cerebral infarction: Secondary | ICD-10-CM | POA: Diagnosis not present

## 2014-10-16 DIAGNOSIS — I69320 Aphasia following cerebral infarction: Secondary | ICD-10-CM | POA: Diagnosis not present

## 2014-10-16 DIAGNOSIS — I2510 Atherosclerotic heart disease of native coronary artery without angina pectoris: Secondary | ICD-10-CM | POA: Diagnosis not present

## 2014-10-16 DIAGNOSIS — I69998 Other sequelae following unspecified cerebrovascular disease: Secondary | ICD-10-CM | POA: Diagnosis not present

## 2014-10-16 DIAGNOSIS — I10 Essential (primary) hypertension: Secondary | ICD-10-CM | POA: Diagnosis not present

## 2014-10-16 DIAGNOSIS — M6281 Muscle weakness (generalized): Secondary | ICD-10-CM | POA: Diagnosis not present

## 2014-10-21 DIAGNOSIS — I69320 Aphasia following cerebral infarction: Secondary | ICD-10-CM | POA: Diagnosis not present

## 2014-10-21 DIAGNOSIS — I69998 Other sequelae following unspecified cerebrovascular disease: Secondary | ICD-10-CM | POA: Diagnosis not present

## 2014-10-21 DIAGNOSIS — M6281 Muscle weakness (generalized): Secondary | ICD-10-CM | POA: Diagnosis not present

## 2014-10-21 DIAGNOSIS — I6931 Cognitive deficits following cerebral infarction: Secondary | ICD-10-CM | POA: Diagnosis not present

## 2014-10-21 DIAGNOSIS — I10 Essential (primary) hypertension: Secondary | ICD-10-CM | POA: Diagnosis not present

## 2014-10-21 DIAGNOSIS — I2510 Atherosclerotic heart disease of native coronary artery without angina pectoris: Secondary | ICD-10-CM | POA: Diagnosis not present

## 2014-10-23 ENCOUNTER — Other Ambulatory Visit: Payer: Self-pay

## 2014-10-24 ENCOUNTER — Ambulatory Visit (INDEPENDENT_AMBULATORY_CARE_PROVIDER_SITE_OTHER): Payer: Medicare Other

## 2014-10-24 DIAGNOSIS — E538 Deficiency of other specified B group vitamins: Secondary | ICD-10-CM | POA: Diagnosis not present

## 2014-10-24 MED ORDER — CYANOCOBALAMIN 1000 MCG/ML IJ SOLN
1000.0000 ug | Freq: Once | INTRAMUSCULAR | Status: AC
  Administered 2014-10-24: 1000 ug via SUBCUTANEOUS

## 2014-10-25 ENCOUNTER — Telehealth: Payer: Self-pay

## 2014-10-25 DIAGNOSIS — I2510 Atherosclerotic heart disease of native coronary artery without angina pectoris: Secondary | ICD-10-CM | POA: Diagnosis not present

## 2014-10-25 DIAGNOSIS — I6931 Cognitive deficits following cerebral infarction: Secondary | ICD-10-CM | POA: Diagnosis not present

## 2014-10-25 DIAGNOSIS — M6281 Muscle weakness (generalized): Secondary | ICD-10-CM | POA: Diagnosis not present

## 2014-10-25 DIAGNOSIS — I69998 Other sequelae following unspecified cerebrovascular disease: Secondary | ICD-10-CM | POA: Diagnosis not present

## 2014-10-25 DIAGNOSIS — I10 Essential (primary) hypertension: Secondary | ICD-10-CM | POA: Diagnosis not present

## 2014-10-25 DIAGNOSIS — I69320 Aphasia following cerebral infarction: Secondary | ICD-10-CM | POA: Diagnosis not present

## 2014-10-25 NOTE — Telephone Encounter (Signed)
Received PA request from [facility] for [place]. Last prescribed by Dr. Asa Lente on 08/13/14. [facility]  We would not do a PA. She stated it was also sent to Dr. Asa Lente.

## 2014-10-28 ENCOUNTER — Other Ambulatory Visit: Payer: Self-pay | Admitting: Geriatric Medicine

## 2014-10-28 ENCOUNTER — Telehealth: Payer: Self-pay

## 2014-10-28 ENCOUNTER — Telehealth: Payer: Self-pay | Admitting: Internal Medicine

## 2014-10-28 DIAGNOSIS — I10 Essential (primary) hypertension: Secondary | ICD-10-CM | POA: Diagnosis not present

## 2014-10-28 DIAGNOSIS — I69998 Other sequelae following unspecified cerebrovascular disease: Secondary | ICD-10-CM | POA: Diagnosis not present

## 2014-10-28 DIAGNOSIS — I2510 Atherosclerotic heart disease of native coronary artery without angina pectoris: Secondary | ICD-10-CM | POA: Diagnosis not present

## 2014-10-28 DIAGNOSIS — M6281 Muscle weakness (generalized): Secondary | ICD-10-CM | POA: Diagnosis not present

## 2014-10-28 DIAGNOSIS — I6931 Cognitive deficits following cerebral infarction: Secondary | ICD-10-CM | POA: Diagnosis not present

## 2014-10-28 DIAGNOSIS — I69320 Aphasia following cerebral infarction: Secondary | ICD-10-CM | POA: Diagnosis not present

## 2014-10-28 MED ORDER — TRIAZOLAM 0.25 MG PO TABS
0.1250 mg | ORAL_TABLET | Freq: Every evening | ORAL | Status: DC | PRN
Start: 2014-10-28 — End: 2015-05-02

## 2014-10-28 NOTE — Telephone Encounter (Signed)
Patient called in asking for refill of Halcion for sleep. Please advise. Thank you.

## 2014-10-28 NOTE — Telephone Encounter (Signed)
Now that she has a PCP we should have her get her sleeping meds from her PCP.

## 2014-10-28 NOTE — Telephone Encounter (Signed)
Sent to pharmacy 

## 2014-10-28 NOTE — Telephone Encounter (Signed)
Patient is requesting halcion to be sent over to [facility].  States pharmacy has been trying since Wed to get refilled.

## 2014-10-30 ENCOUNTER — Telehealth: Payer: Self-pay | Admitting: Cardiology

## 2014-10-30 DIAGNOSIS — I6931 Cognitive deficits following cerebral infarction: Secondary | ICD-10-CM | POA: Diagnosis not present

## 2014-10-30 DIAGNOSIS — I69998 Other sequelae following unspecified cerebrovascular disease: Secondary | ICD-10-CM | POA: Diagnosis not present

## 2014-10-30 DIAGNOSIS — M6281 Muscle weakness (generalized): Secondary | ICD-10-CM | POA: Diagnosis not present

## 2014-10-30 DIAGNOSIS — I2510 Atherosclerotic heart disease of native coronary artery without angina pectoris: Secondary | ICD-10-CM | POA: Diagnosis not present

## 2014-10-30 DIAGNOSIS — I10 Essential (primary) hypertension: Secondary | ICD-10-CM | POA: Diagnosis not present

## 2014-10-30 DIAGNOSIS — I69320 Aphasia following cerebral infarction: Secondary | ICD-10-CM | POA: Diagnosis not present

## 2014-10-30 NOTE — Telephone Encounter (Signed)
Left message to call back  

## 2014-10-30 NOTE — Telephone Encounter (Signed)
New message     Pt returning Kim's call

## 2014-10-30 NOTE — Telephone Encounter (Signed)
Informed the pt that Maudie Mercury was contacting her to inform her that per Dr Mare Ferrari, now that she has a PCP, she should request her sleeping meds from her PCP. Pt verbalized understanding and states that she called her PCP yesterday and they filled her sleeping med.  Pt gracious for all the assistance provided.

## 2014-11-03 DIAGNOSIS — F419 Anxiety disorder, unspecified: Secondary | ICD-10-CM | POA: Diagnosis not present

## 2014-11-03 DIAGNOSIS — M170 Bilateral primary osteoarthritis of knee: Secondary | ICD-10-CM | POA: Diagnosis not present

## 2014-11-03 DIAGNOSIS — Z9181 History of falling: Secondary | ICD-10-CM | POA: Diagnosis not present

## 2014-11-03 DIAGNOSIS — I69320 Aphasia following cerebral infarction: Secondary | ICD-10-CM | POA: Diagnosis not present

## 2014-11-03 DIAGNOSIS — I6931 Cognitive deficits following cerebral infarction: Secondary | ICD-10-CM | POA: Diagnosis not present

## 2014-11-03 DIAGNOSIS — I2510 Atherosclerotic heart disease of native coronary artery without angina pectoris: Secondary | ICD-10-CM | POA: Diagnosis not present

## 2014-11-03 DIAGNOSIS — I872 Venous insufficiency (chronic) (peripheral): Secondary | ICD-10-CM | POA: Diagnosis not present

## 2014-11-03 DIAGNOSIS — M6281 Muscle weakness (generalized): Secondary | ICD-10-CM | POA: Diagnosis not present

## 2014-11-03 DIAGNOSIS — M47817 Spondylosis without myelopathy or radiculopathy, lumbosacral region: Secondary | ICD-10-CM | POA: Diagnosis not present

## 2014-11-03 DIAGNOSIS — I69398 Other sequelae of cerebral infarction: Secondary | ICD-10-CM | POA: Diagnosis not present

## 2014-11-03 DIAGNOSIS — Z602 Problems related to living alone: Secondary | ICD-10-CM | POA: Diagnosis not present

## 2014-11-03 DIAGNOSIS — I10 Essential (primary) hypertension: Secondary | ICD-10-CM | POA: Diagnosis not present

## 2014-11-03 DIAGNOSIS — D519 Vitamin B12 deficiency anemia, unspecified: Secondary | ICD-10-CM | POA: Diagnosis not present

## 2014-11-03 DIAGNOSIS — F329 Major depressive disorder, single episode, unspecified: Secondary | ICD-10-CM | POA: Diagnosis not present

## 2014-11-04 DIAGNOSIS — H2513 Age-related nuclear cataract, bilateral: Secondary | ICD-10-CM | POA: Diagnosis not present

## 2014-11-04 DIAGNOSIS — H47321 Drusen of optic disc, right eye: Secondary | ICD-10-CM | POA: Diagnosis not present

## 2014-11-04 DIAGNOSIS — H04123 Dry eye syndrome of bilateral lacrimal glands: Secondary | ICD-10-CM | POA: Diagnosis not present

## 2014-11-04 DIAGNOSIS — H3531 Nonexudative age-related macular degeneration: Secondary | ICD-10-CM | POA: Diagnosis not present

## 2014-11-06 ENCOUNTER — Other Ambulatory Visit: Payer: Self-pay | Admitting: Internal Medicine

## 2014-11-06 DIAGNOSIS — M6281 Muscle weakness (generalized): Secondary | ICD-10-CM | POA: Diagnosis not present

## 2014-11-06 DIAGNOSIS — I10 Essential (primary) hypertension: Secondary | ICD-10-CM | POA: Diagnosis not present

## 2014-11-06 DIAGNOSIS — I69398 Other sequelae of cerebral infarction: Secondary | ICD-10-CM | POA: Diagnosis not present

## 2014-11-06 DIAGNOSIS — I6931 Cognitive deficits following cerebral infarction: Secondary | ICD-10-CM | POA: Diagnosis not present

## 2014-11-06 DIAGNOSIS — M170 Bilateral primary osteoarthritis of knee: Secondary | ICD-10-CM | POA: Diagnosis not present

## 2014-11-06 DIAGNOSIS — I69320 Aphasia following cerebral infarction: Secondary | ICD-10-CM | POA: Diagnosis not present

## 2014-11-07 ENCOUNTER — Other Ambulatory Visit: Payer: Self-pay | Admitting: Emergency Medicine

## 2014-11-07 DIAGNOSIS — E7800 Pure hypercholesterolemia, unspecified: Secondary | ICD-10-CM

## 2014-11-07 DIAGNOSIS — I63511 Cerebral infarction due to unspecified occlusion or stenosis of right middle cerebral artery: Secondary | ICD-10-CM

## 2014-11-07 MED ORDER — PRAVASTATIN SODIUM 20 MG PO TABS: ORAL_TABLET | ORAL | Status: DC

## 2014-11-08 ENCOUNTER — Other Ambulatory Visit: Payer: Self-pay | Admitting: Internal Medicine

## 2014-11-08 ENCOUNTER — Ambulatory Visit (INDEPENDENT_AMBULATORY_CARE_PROVIDER_SITE_OTHER): Payer: Medicare Other | Admitting: Family Medicine

## 2014-11-08 VITALS — BP 138/64 | HR 87 | Temp 98.2°F | Resp 12 | Ht 60.0 in | Wt 166.0 lb

## 2014-11-08 DIAGNOSIS — I5031 Acute diastolic (congestive) heart failure: Secondary | ICD-10-CM

## 2014-11-08 DIAGNOSIS — Z8673 Personal history of transient ischemic attack (TIA), and cerebral infarction without residual deficits: Secondary | ICD-10-CM | POA: Diagnosis not present

## 2014-11-08 DIAGNOSIS — I639 Cerebral infarction, unspecified: Secondary | ICD-10-CM

## 2014-11-08 DIAGNOSIS — I69398 Other sequelae of cerebral infarction: Secondary | ICD-10-CM | POA: Diagnosis not present

## 2014-11-08 DIAGNOSIS — M7989 Other specified soft tissue disorders: Secondary | ICD-10-CM | POA: Diagnosis not present

## 2014-11-08 DIAGNOSIS — E871 Hypo-osmolality and hyponatremia: Secondary | ICD-10-CM

## 2014-11-08 LAB — POCT CBC
Granulocyte percent: 61.5 %G (ref 37–80)
HEMATOCRIT: 36 % — AB (ref 37.7–47.9)
HEMOGLOBIN: 11.6 g/dL — AB (ref 12.2–16.2)
Lymph, poc: 1.6 (ref 0.6–3.4)
MCH: 30 pg (ref 27–31.2)
MCHC: 32.3 g/dL (ref 31.8–35.4)
MCV: 92.9 fL (ref 80–97)
MID (cbc): 0.5 (ref 0–0.9)
MPV: 6.5 fL (ref 0–99.8)
POC Granulocyte: 3.3 (ref 2–6.9)
POC LYMPH PERCENT: 30 %L (ref 10–50)
POC MID %: 8.5 % (ref 0–12)
Platelet Count, POC: 349 10*3/uL (ref 142–424)
RBC: 3.88 M/uL — AB (ref 4.04–5.48)
RDW, POC: 13.8 %
WBC: 5.3 10*3/uL (ref 4.6–10.2)

## 2014-11-08 LAB — COMPREHENSIVE METABOLIC PANEL
ALBUMIN: 4 g/dL (ref 3.6–5.1)
ALK PHOS: 39 U/L (ref 33–130)
ALT: 14 U/L (ref 6–29)
AST: 9 U/L — AB (ref 10–35)
BUN: 12 mg/dL (ref 7–25)
CO2: 27 mmol/L (ref 20–31)
Calcium: 9 mg/dL (ref 8.6–10.4)
Chloride: 93 mmol/L — ABNORMAL LOW (ref 98–110)
Creat: 0.59 mg/dL — ABNORMAL LOW (ref 0.60–0.88)
Glucose, Bld: 107 mg/dL — ABNORMAL HIGH (ref 65–99)
Potassium: 4.3 mmol/L (ref 3.5–5.3)
SODIUM: 130 mmol/L — AB (ref 135–146)
TOTAL PROTEIN: 6.3 g/dL (ref 6.1–8.1)
Total Bilirubin: 0.2 mg/dL (ref 0.2–1.2)

## 2014-11-08 NOTE — Patient Instructions (Signed)
1. START LASIX 20MG  ONE TABLET EVERY MORNING.

## 2014-11-08 NOTE — Progress Notes (Addendum)
Subjective:  This chart was scribed for Reginia Forts, MD by Thea Alken, ED Scribe. This patient was seen in room 3 and the patient's care was started at 3:27 PM.   Patient ID: Sarah [name], female    DOB: [birth year], 81 y.o.   MRN: [id number]  HPI Chief Complaint  Patient presents with   Edema    Bilateral, Ankles and Legs   HPI Comments: Sarah [name] is a 81 y.o. female who presents to the [facility] for bilateral LE edema.  Last saw cardio 08/09/14. Normal EF 88%. She had a 2D echo in April 2016, EF 60% she had a grade 1 dystolic dysfunction. Her weight is up 5 lbs from June. Sodium 128 6/16 at ED visit. Stroke April 2015. Pt reports stroke took part of her memory. Pt has PT twice a week.  Pt has had intermittent LE swelling for several years but states current episode of edema started 3 weeks ago. She took a lasix one day last week at about 11am, which caused urinary urgency during the night, having difficulty getting to the bathroom, ambulates with a walker. She stopped taking lasix, and started drinking lemon water, a remedy they found online for swelling without relief. She usually takes lasix she develops swelling. She tried to get an appointment with Dr. Doug Sou today, but states he was unavailable. She does not eat food with salts and drinking a lot of soda. She has SOB with getting into her bed and lying flat at night, She denies CP and palpitations.  Left knee pain started today. Has been taking tylenol extra strength.  Pt also has a intermittent, dry, cough, since having the stroke. Pt is not concerned much about the cough. Per pt's daughter, pt will occasional cough during the night.     Past Medical History  Diagnosis Date   Osteoarthritis    Bladder incontinence    Constipation     hx of   Hypertension    Hyperlipidemia    Chest pain     S/P nuclear 07/17/2008  normal; EF 88% with no ischemia   Chronic venous insufficiency     BLE edema,  chronic, neg venous US 2/13   History of chicken pox    Depression    Diverticulitis    Polyp of colon    Osteoporosis, postmenopausal 06/02/2011   Esophageal stricture     s/p dilation 04/2012   GERD (gastroesophageal reflux disease)    Stroke 07/03/2014   Prior to Admission medications   Medication Sig Start Date End Date Taking? Authorizing Provider  acetaminophen (TYLENOL) 500 MG tablet Take 1 tablet (500 mg total) by mouth every 8 (eight) hours as needed (for pain). 08/09/14  Yes Darlin Coco, MD  ALPRAZolam Duanne Moron) 1 MG tablet TAKE 1 TABLET TWICE DAILY. 09/11/14  Yes Biagio Borg, MD  Cholecalciferol (VITAMIN D PO) Take 1 tablet by mouth daily.    Yes Historical Provider, MD  clobetasol ointment (TEMOVATE) 8.54 % Apply 1 application topically 2 (two) times daily.  02/03/12  Yes Historical Provider, MD  clopidogrel (PLAVIX) 75 MG tablet TAKE 1 TABLET ONCE DAILY. 09/03/14  Yes Darlin Coco, MD  cyanocobalamin (,VITAMIN B-12,) 1000 MCG/ML injection INJECT 1 ML INTO THE MUSCLE EVERY 30 DAYS. 01/11/14  Yes Rowe Clack, MD  diclofenac sodium (VOLTAREN) 1 % GEL Apply  To feet 3 times daily prn for pain 05/20/14  Yes Donika K Patel, DO  furosemide (LASIX) 20  MG tablet Take 20 mg by mouth daily as needed (for swelling).   Yes Historical Provider, MD  Glucosamine HCl 750 MG TABS Take 1 tablet by mouth 2 (two) times daily.    Yes Historical Provider, MD  Misc Natural Products (TUMERSAID) TABS Take 1 capsule by mouth 2 (two) times daily. 03/01/13  Yes Lyndal Pulley, DO  nitroGLYCERIN (NITROSTAT) 0.4 MG SL tablet Place 1 tablet (0.4 mg total) under the tongue every 5 (five) minutes as needed for chest pain. 07/20/13  Yes Darlin Coco, MD  nortriptyline (PAMELOR) 25 MG capsule TAKE 1 CAPSULE AT BEDTIME. 10/01/14  Yes Olga Millers, MD  pantoprazole (PROTONIX) 40 MG tablet Take 40 mg by mouth daily.  08/11/14  Yes Historical Provider, MD  pravastatin (PRAVACHOL) 20 MG tablet 1/2 qd  11/07/14  Yes Hendricks Limes, MD  solifenacin (VESICARE) 10 MG tablet Take 10 mg by mouth daily as needed (to reduce number of bathroom visits during travel).   Yes Historical Provider, MD  triazolam (HALCION) 0.25 MG tablet Take 0.5 tablets (0.125 mg total) by mouth at bedtime as needed for sleep. 10/28/14  Yes Olga Millers, MD   Social History   Social History   Marital Status: Widowed    Spouse Name: N/A   Number of Children: N/A   Years of Education: N/A   Occupational History   Not on file.   Social History Main Topics   Smoking status: Never Smoker    Smokeless tobacco: Never Used   Alcohol Use: No   Drug Use: No   Sexual Activity: Not on file   Other Topics Concern   Not on file   Social History Narrative   She lives alone in [place] home, but stays on the first floor.   Review of Systems  Constitutional: Negative for fever, chills, diaphoresis and fatigue.  Eyes: Negative for visual disturbance.  Respiratory: Positive for cough and shortness of breath.   Cardiovascular: Positive for leg swelling. Negative for chest pain and palpitations.  Gastrointestinal: Negative for nausea, vomiting, abdominal pain, diarrhea and constipation.  Endocrine: Negative for cold intolerance, heat intolerance, polydipsia, polyphagia and polyuria.  Neurological: Negative for dizziness, tremors, seizures, syncope, facial asymmetry, speech difficulty, weakness, light-headedness, numbness and headaches.   Objective:   Physical Exam  Constitutional: She is oriented to person, place, and time. She appears well-developed and well-nourished. No distress.  HENT:  Head: Normocephalic and atraumatic.  Right Ear: External ear normal.  Left Ear: External ear normal.  Nose: Nose normal.  Mouth/Throat: Oropharynx is clear and moist.  Eyes: Conjunctivae and EOM are normal. Pupils are equal, round, and reactive to light.  Neck: Normal range of motion. Neck supple. Carotid bruit is not  present. No thyromegaly present.  Cardiovascular: Normal rate, regular rhythm, normal heart sounds and intact distal pulses.  Exam reveals no gallop and no friction rub.   No murmur heard. Pulses:      Posterior tibial pulses are 2+ on the left side.  Pulmonary/Chest: Effort normal and breath sounds normal. She has no wheezes. She has no rales.  Abdominal: Soft. Bowel sounds are normal. She exhibits no distension and no mass. There is no tenderness. There is no rebound and no guarding.  Musculoskeletal: Normal range of motion.  Lymphadenopathy:    She has no cervical adenopathy.  Neurological: She is alert and oriented to person, place, and time. No cranial nerve deficit.  1+ -2+ edema extending to proximal knees bilaterally.  Skin:  Skin is warm and dry. No rash noted. She is not diaphoretic. No erythema. No pallor.  Psychiatric: She has a normal mood and affect. Her behavior is normal.  Nursing note and vitals reviewed.    EKG: NSR; no acute changes.  Filed Vitals:   11/08/14 1401  BP: 138/64  Pulse: 87  Temp: 98.2 F (36.8 C)  TempSrc: Oral  Resp: 12  Height: 5' (1.524 m)  Weight: 166 lb (75.297 kg)  SpO2: 95%    Assessment & Plan:   1. Leg swelling   2. Acute diastolic heart failure   3. Hyponatremia   4. History of CVA (cerebrovascular accident)    No orders of the defined types were placed in this encounter.     1.  B lower extremity edema:  Recurrent; onset three weeks ago; known diastolic heart failure grade I; start Lasix 20mg  daily for the next 5-7 days.  Close follow-up with PCP; weights daily. 2.  Acute diastolic heart failure: Recurrent; restart daily lasix.  3.  Hyponatremia; New in June; repeat today. 4.  History of CVA: less active since CVA which is likely contributing to swelling as well.   I personally performed the services described in this documentation, which was scribed in my presence. The recorded information has been reviewed and  considered.  Kristi Elayne Guerin, M.D. [facility] [address] [place], [place]  [address] [phone] phone [phone] fax

## 2014-11-09 ENCOUNTER — Telehealth: Payer: Self-pay

## 2014-11-09 NOTE — Telephone Encounter (Signed)
Patient's daughter Charna Busman called in about Mom's test results that were done yesterday. Dr. Tamala Julian is suppose to call with the results and they prefer that she call the daughter with this information at [phone]

## 2014-11-10 NOTE — Telephone Encounter (Signed)
Please call pt/daughter with lab results.

## 2014-11-10 NOTE — Telephone Encounter (Signed)
Lab results given to daughter by Lamar Blinks

## 2014-11-11 ENCOUNTER — Other Ambulatory Visit: Payer: Self-pay | Admitting: Internal Medicine

## 2014-11-11 ENCOUNTER — Telehealth: Payer: Self-pay | Admitting: Cardiology

## 2014-11-11 NOTE — Telephone Encounter (Signed)
New message      Talk to [facility] regarding her urgent care visit on last Friday.

## 2014-11-11 NOTE — Telephone Encounter (Signed)
Patient phoned and had to go to Urgent Care 11/08/14 secondary to LE edema, was given Lasix daily until Friday, then decrease to as needed Stated swelling improving.  Advised patient if returns and needing Lasix daily to call back, verbalized understanding.  Scheduled her follow up appointment

## 2014-11-12 DIAGNOSIS — I69320 Aphasia following cerebral infarction: Secondary | ICD-10-CM | POA: Diagnosis not present

## 2014-11-12 DIAGNOSIS — I6931 Cognitive deficits following cerebral infarction: Secondary | ICD-10-CM | POA: Diagnosis not present

## 2014-11-12 DIAGNOSIS — M170 Bilateral primary osteoarthritis of knee: Secondary | ICD-10-CM | POA: Diagnosis not present

## 2014-11-12 DIAGNOSIS — I69398 Other sequelae of cerebral infarction: Secondary | ICD-10-CM | POA: Diagnosis not present

## 2014-11-12 DIAGNOSIS — I10 Essential (primary) hypertension: Secondary | ICD-10-CM | POA: Diagnosis not present

## 2014-11-12 DIAGNOSIS — M6281 Muscle weakness (generalized): Secondary | ICD-10-CM | POA: Diagnosis not present

## 2014-11-13 ENCOUNTER — Ambulatory Visit (INDEPENDENT_AMBULATORY_CARE_PROVIDER_SITE_OTHER): Payer: Medicare Other | Admitting: Family Medicine

## 2014-11-13 ENCOUNTER — Encounter: Payer: Self-pay | Admitting: Family Medicine

## 2014-11-13 VITALS — BP 124/78 | HR 92 | Ht 60.0 in | Wt 167.0 lb

## 2014-11-13 DIAGNOSIS — M4698 Unspecified inflammatory spondylopathy, sacral and sacrococcygeal region: Secondary | ICD-10-CM

## 2014-11-13 DIAGNOSIS — I639 Cerebral infarction, unspecified: Secondary | ICD-10-CM

## 2014-11-13 DIAGNOSIS — M47818 Spondylosis without myelopathy or radiculopathy, sacral and sacrococcygeal region: Secondary | ICD-10-CM

## 2014-11-13 NOTE — Assessment & Plan Note (Signed)
Patient given an injection today and tolerated the procedure well. We discussed icing regimen and as well as doing the topical anti-inflammatory and patient given a trial size. Patient can have repeat injections are 3-4 months. Discussed with patient though to make a follow-up appointment in 3-4 weeks if she has any shoulder or knee pain that she discussed when leaving. Spent  25 minutes with patient face-to-face and had greater than 50% of counseling including as described above in assessment and plan.

## 2014-11-13 NOTE — Progress Notes (Signed)
CC: Back pain followup  HPI: Patient is a very pleasant 81 year old female coming in with chronic back pain. Patient was treated for severe sacroiliac joint arthritis. It has been greater than 3 months since patient's last injection. Patient since last exam though did have a hospitalization when patient did have a CVA.  Patient states overall she is starting have worsening back pain again. Continued to have some mild bilateral knee pain with the back pain is worse. Still over the sacroiliac joint. Patient states that she is still in the leading with the aid of her walker. Denies any significant radiation down the legs but has noticed more swelling of her legs. Was seen in urgent care and told to take for a some mild on a more regular basis. Patient is followed by a cardiologist are ready but denies any chest pain or any difficulty breathing.    Previous imaging studies XR lumbar spine 06/07/2012: Findings: There is curvature convex to the left. There is advanced degenerative disc disease at T12-L1, L1-2 and L4-5. The L5-S1 level is transitional. There is anterolisthesis of 3 mm of L4-5.  There is retrolisthesis of 3 mm at T12-L1 and L1-2. These degenerative changes have worsened since 2010. No evidence of fracture.  IMPRESSION:  Curvature convex to the left. Worsening of degenerative disc disease at T12-L1, L1-2 and L4-5.   Past medical, surgical, family and social history reviewed. Medications reviewed all in the electronic medical record.   Review of Systems: No headache, visual changes, nausea, vomiting, diarrhea, constipation, dizziness, abdominal pain, skin rash, fevers, chills, night sweats, weight loss, swollen lymph nodes, body aches, joint swelling, muscle aches, chest pain, shortness of breath, mood changes.   Objective:    Blood pressure 124/78, pulse 92, height 5' (1.524 m), weight 167 lb (75.751 kg), SpO2 93 %.   General: No apparent distress alert and oriented x3 mood and affect  normal, dressed appropriately.  HEENT: Pupils equal, extraocular movements intact Respiratory: Patient's speak in full sentences and does not appear short of breath Cardiovascular: No lower extremity edema, non tender, no erythema Skin: Warm dry intact with no signs of infection or rash on extremities or on axial skeleton. Abdomen: Soft nontender Neuro: Cranial nerves II through XII are intact, neurovascularly intact in all extremities with 2+ DTRs and 2+ pulses. Lymph: No lymphadenopathy of posterior or anterior cervical chain or axillae bilaterally.  Gait walks with a walker and shuffling gait MSK: Non tender with full range of motion and good stability and symmetric strength and tone of shoulders, elbows, wrist, hip, and ankles bilaterally. Moderate loss of range of motion of multiple joints secondary to osteoporosis. Shoulder: Right Inspection reveals no abnormalities, atrophy or asymmetry. Palpation is normal with no tenderness over AC joint or bicipital groove. ROM is full in all planes passively. Rotator cuff strength normal throughout. signs of impingement with positive Neer and Hawkin's tests, but negative empty can sign. Speeds and Yergason's tests normal. No labral pathology noted with negative Obrien's, negative clunk and good stability. Normal scapular function observed. No painful arc and no drop arm sign. No apprehension sign Contralateral shoulder has some mild crepitus and impingement as well but not as severe as the right side. Mild improvement from previous exam  Back Exam:  Inspection:increase kyphosis as well as some scoliosis Motion: Flexion 35 deg, Extension 25 deg, Side Bending to 25 deg bilaterally,  Rotation to 25 deg bilaterally  SLR laying: pain but no radiation  XSLR laying: Negative  Palpable tenderness:  Severe tenderness over the right sacroiliac joint FABER: unable for patient to participate. Sensory change: Gross sensation intact to all lumbar and  sacral dermatomes.  Reflexes: 2+ at both patellar tendons, 2+ at achilles tendons, Babinski's downgoing.  Strength at foot  Plantar-flexion: 5/5 Dorsi-flexion: 5/5 Eversion: 5/5 Inversion: 5/5  Leg strength  Quad: 4/5 Hamstring: 4/5 Hip flexor: 4/5 Hip abductors: 3/5  But symmetric to contralateral side.   Procedure: Real-time Ultrasound Guided Injection of left sacroiliac joint Device: GE Logiq E  Ultrasound guided injection is preferred based studies that show increased duration, increased effect, greater accuracy, decreased procedural pain, increased response rate, and decreased cost with ultrasound guided versus blind injection.  Verbal informed consent obtained.  Time-out conducted.  Noted no overlying erythema, induration, or other signs of local infection.  Skin prepped in a sterile fashion.  Local anesthesia: Topical Ethyl chloride.  With sterile technique and under real time ultrasound guidance:  With a 22-gauge 3-1/2 inch needle patient was injected with 1 mL of 0.5% Marcaine and 1 mL of Kenalog 40 mg/dL into the joint specified. Completed without difficulty  Pain immediately resolved suggesting accurate placement of the medication.  Advised to call if fevers/chills, erythema, induration, drainage, or persistent bleeding.  Images permanently stored and available for review in the ultrasound unit.  Impression: Technically successful ultrasound guided injection.      Impression and Recommendations:     This case required medical decision making of moderate complexity.

## 2014-11-13 NOTE — Patient Instructions (Addendum)
Good to see you Keep being you! Ice is your friend when you need it Try to be active Lets take the lasix daily until swelling subsides We can repeat every 3-4 months.   See me again  Though in 3 weeks if shoulder and knees still hurting.

## 2014-11-13 NOTE — Progress Notes (Signed)
Pre visit review using our clinic review tool, if applicable. No additional management support is needed unless otherwise documented below in the visit note. 

## 2014-11-14 ENCOUNTER — Other Ambulatory Visit: Payer: Self-pay | Admitting: Internal Medicine

## 2014-11-15 DIAGNOSIS — I10 Essential (primary) hypertension: Secondary | ICD-10-CM | POA: Diagnosis not present

## 2014-11-15 DIAGNOSIS — I69320 Aphasia following cerebral infarction: Secondary | ICD-10-CM | POA: Diagnosis not present

## 2014-11-15 DIAGNOSIS — I69398 Other sequelae of cerebral infarction: Secondary | ICD-10-CM | POA: Diagnosis not present

## 2014-11-15 DIAGNOSIS — M170 Bilateral primary osteoarthritis of knee: Secondary | ICD-10-CM | POA: Diagnosis not present

## 2014-11-15 DIAGNOSIS — M6281 Muscle weakness (generalized): Secondary | ICD-10-CM | POA: Diagnosis not present

## 2014-11-15 DIAGNOSIS — I6931 Cognitive deficits following cerebral infarction: Secondary | ICD-10-CM | POA: Diagnosis not present

## 2014-11-18 ENCOUNTER — Other Ambulatory Visit: Payer: Self-pay | Admitting: Internal Medicine

## 2014-11-18 DIAGNOSIS — I69398 Other sequelae of cerebral infarction: Secondary | ICD-10-CM | POA: Diagnosis not present

## 2014-11-18 DIAGNOSIS — I6931 Cognitive deficits following cerebral infarction: Secondary | ICD-10-CM | POA: Diagnosis not present

## 2014-11-18 DIAGNOSIS — I69320 Aphasia following cerebral infarction: Secondary | ICD-10-CM | POA: Diagnosis not present

## 2014-11-18 DIAGNOSIS — M6281 Muscle weakness (generalized): Secondary | ICD-10-CM | POA: Diagnosis not present

## 2014-11-18 DIAGNOSIS — I10 Essential (primary) hypertension: Secondary | ICD-10-CM | POA: Diagnosis not present

## 2014-11-18 DIAGNOSIS — M170 Bilateral primary osteoarthritis of knee: Secondary | ICD-10-CM | POA: Diagnosis not present

## 2014-11-21 ENCOUNTER — Ambulatory Visit: Payer: Medicare Other

## 2014-11-25 DIAGNOSIS — I69320 Aphasia following cerebral infarction: Secondary | ICD-10-CM | POA: Diagnosis not present

## 2014-11-25 DIAGNOSIS — I6931 Cognitive deficits following cerebral infarction: Secondary | ICD-10-CM | POA: Diagnosis not present

## 2014-11-25 DIAGNOSIS — M170 Bilateral primary osteoarthritis of knee: Secondary | ICD-10-CM | POA: Diagnosis not present

## 2014-11-25 DIAGNOSIS — I10 Essential (primary) hypertension: Secondary | ICD-10-CM | POA: Diagnosis not present

## 2014-11-25 DIAGNOSIS — M6281 Muscle weakness (generalized): Secondary | ICD-10-CM | POA: Diagnosis not present

## 2014-11-25 DIAGNOSIS — I69398 Other sequelae of cerebral infarction: Secondary | ICD-10-CM | POA: Diagnosis not present

## 2014-11-28 ENCOUNTER — Ambulatory Visit (INDEPENDENT_AMBULATORY_CARE_PROVIDER_SITE_OTHER): Payer: Medicare Other

## 2014-11-28 DIAGNOSIS — E538 Deficiency of other specified B group vitamins: Secondary | ICD-10-CM | POA: Diagnosis not present

## 2014-11-29 DIAGNOSIS — I69398 Other sequelae of cerebral infarction: Secondary | ICD-10-CM | POA: Diagnosis not present

## 2014-11-29 DIAGNOSIS — M6281 Muscle weakness (generalized): Secondary | ICD-10-CM | POA: Diagnosis not present

## 2014-11-29 DIAGNOSIS — I10 Essential (primary) hypertension: Secondary | ICD-10-CM | POA: Diagnosis not present

## 2014-11-29 DIAGNOSIS — I69320 Aphasia following cerebral infarction: Secondary | ICD-10-CM | POA: Diagnosis not present

## 2014-11-29 DIAGNOSIS — I6931 Cognitive deficits following cerebral infarction: Secondary | ICD-10-CM | POA: Diagnosis not present

## 2014-11-29 DIAGNOSIS — M170 Bilateral primary osteoarthritis of knee: Secondary | ICD-10-CM | POA: Diagnosis not present

## 2014-11-29 MED ORDER — CYANOCOBALAMIN 1000 MCG/ML IJ SOLN
1000.0000 ug | Freq: Once | INTRAMUSCULAR | Status: AC
  Administered 2014-11-28: 1000 ug via INTRAMUSCULAR

## 2014-12-02 DIAGNOSIS — M6281 Muscle weakness (generalized): Secondary | ICD-10-CM | POA: Diagnosis not present

## 2014-12-02 DIAGNOSIS — I10 Essential (primary) hypertension: Secondary | ICD-10-CM | POA: Diagnosis not present

## 2014-12-02 DIAGNOSIS — I69398 Other sequelae of cerebral infarction: Secondary | ICD-10-CM | POA: Diagnosis not present

## 2014-12-02 DIAGNOSIS — I6931 Cognitive deficits following cerebral infarction: Secondary | ICD-10-CM | POA: Diagnosis not present

## 2014-12-02 DIAGNOSIS — I69320 Aphasia following cerebral infarction: Secondary | ICD-10-CM | POA: Diagnosis not present

## 2014-12-02 DIAGNOSIS — M170 Bilateral primary osteoarthritis of knee: Secondary | ICD-10-CM | POA: Diagnosis not present

## 2014-12-09 DIAGNOSIS — I69320 Aphasia following cerebral infarction: Secondary | ICD-10-CM | POA: Diagnosis not present

## 2014-12-09 DIAGNOSIS — I69398 Other sequelae of cerebral infarction: Secondary | ICD-10-CM | POA: Diagnosis not present

## 2014-12-09 DIAGNOSIS — I10 Essential (primary) hypertension: Secondary | ICD-10-CM | POA: Diagnosis not present

## 2014-12-09 DIAGNOSIS — I6931 Cognitive deficits following cerebral infarction: Secondary | ICD-10-CM | POA: Diagnosis not present

## 2014-12-09 DIAGNOSIS — M170 Bilateral primary osteoarthritis of knee: Secondary | ICD-10-CM | POA: Diagnosis not present

## 2014-12-09 DIAGNOSIS — M6281 Muscle weakness (generalized): Secondary | ICD-10-CM | POA: Diagnosis not present

## 2014-12-16 DIAGNOSIS — I69398 Other sequelae of cerebral infarction: Secondary | ICD-10-CM | POA: Diagnosis not present

## 2014-12-16 DIAGNOSIS — I10 Essential (primary) hypertension: Secondary | ICD-10-CM | POA: Diagnosis not present

## 2014-12-16 DIAGNOSIS — I6931 Cognitive deficits following cerebral infarction: Secondary | ICD-10-CM | POA: Diagnosis not present

## 2014-12-16 DIAGNOSIS — M6281 Muscle weakness (generalized): Secondary | ICD-10-CM | POA: Diagnosis not present

## 2014-12-16 DIAGNOSIS — M170 Bilateral primary osteoarthritis of knee: Secondary | ICD-10-CM | POA: Diagnosis not present

## 2014-12-16 DIAGNOSIS — I69320 Aphasia following cerebral infarction: Secondary | ICD-10-CM | POA: Diagnosis not present

## 2014-12-18 DIAGNOSIS — M6281 Muscle weakness (generalized): Secondary | ICD-10-CM | POA: Diagnosis not present

## 2014-12-18 DIAGNOSIS — M170 Bilateral primary osteoarthritis of knee: Secondary | ICD-10-CM | POA: Diagnosis not present

## 2014-12-18 DIAGNOSIS — I69398 Other sequelae of cerebral infarction: Secondary | ICD-10-CM | POA: Diagnosis not present

## 2014-12-18 DIAGNOSIS — I6931 Cognitive deficits following cerebral infarction: Secondary | ICD-10-CM | POA: Diagnosis not present

## 2014-12-18 DIAGNOSIS — I10 Essential (primary) hypertension: Secondary | ICD-10-CM | POA: Diagnosis not present

## 2014-12-18 DIAGNOSIS — I69320 Aphasia following cerebral infarction: Secondary | ICD-10-CM | POA: Diagnosis not present

## 2014-12-23 ENCOUNTER — Ambulatory Visit (INDEPENDENT_AMBULATORY_CARE_PROVIDER_SITE_OTHER): Payer: Medicare Other | Admitting: Family Medicine

## 2014-12-23 VITALS — BP 140/75 | HR 93 | Temp 97.5°F | Resp 18 | Ht 60.0 in | Wt 163.2 lb

## 2014-12-23 DIAGNOSIS — J0100 Acute maxillary sinusitis, unspecified: Secondary | ICD-10-CM

## 2014-12-23 DIAGNOSIS — M170 Bilateral primary osteoarthritis of knee: Secondary | ICD-10-CM | POA: Diagnosis not present

## 2014-12-23 DIAGNOSIS — J309 Allergic rhinitis, unspecified: Secondary | ICD-10-CM | POA: Diagnosis not present

## 2014-12-23 DIAGNOSIS — M6281 Muscle weakness (generalized): Secondary | ICD-10-CM | POA: Diagnosis not present

## 2014-12-23 DIAGNOSIS — Z8673 Personal history of transient ischemic attack (TIA), and cerebral infarction without residual deficits: Secondary | ICD-10-CM

## 2014-12-23 DIAGNOSIS — I69320 Aphasia following cerebral infarction: Secondary | ICD-10-CM | POA: Diagnosis not present

## 2014-12-23 DIAGNOSIS — I6931 Cognitive deficits following cerebral infarction: Secondary | ICD-10-CM | POA: Diagnosis not present

## 2014-12-23 DIAGNOSIS — I639 Cerebral infarction, unspecified: Secondary | ICD-10-CM | POA: Diagnosis not present

## 2014-12-23 DIAGNOSIS — I10 Essential (primary) hypertension: Secondary | ICD-10-CM | POA: Diagnosis not present

## 2014-12-23 DIAGNOSIS — I69398 Other sequelae of cerebral infarction: Secondary | ICD-10-CM | POA: Diagnosis not present

## 2014-12-23 MED ORDER — AMOXICILLIN 500 MG PO CAPS: 500.0000 mg | ORAL_CAPSULE | Freq: Two times a day (BID) | ORAL | Status: DC

## 2014-12-23 MED ORDER — METHYLPREDNISOLONE 4 MG PO TBPK: ORAL_TABLET | ORAL | Status: DC

## 2014-12-23 NOTE — Progress Notes (Signed)
Chief Complaint:  Chief Complaint  Patient presents with  . Sinus Problem    C/O headache, facial pressure, congestion, off & on x 2-3 months    HPI: Sarah [name] is a 81 y.o. female who reports to [facility] today complaining of intermittent 2-3 month hx of runny nose, when she turn her head slushy feeling and she has no energy. Denies cough, denies SOB or wheezing.  She has allergies and has tried everything, flonase, antihistamines. She has had chills , she has sinus pain, she has had a hx of stroke, she also has chronic sinus issues. Denies any weakness, numbness or tingling. LASt CT scan in June 2016 shows:   IMPRESSION: 1. No acute intracranial findings. 2. Periventricular white matter and corona radiata hypodensities favor chronic ischemic microvascular white matter disease. The right frontal lobe white matter infarct shown to be acute in April, 2016 is somewhat indistinctly apparent in the periventricular white matter, but no new infarct is identified. 3. Right sphenoid wing calcified meningioma. 4. Chronic right maxillary sinusitis.   Electronically Signed  By: Van Clines M.D.  On: 08/29/2014 16:52  Past Medical History  Diagnosis Date  . Osteoarthritis   . Bladder incontinence   . Constipation     hx of  . Hypertension   . Hyperlipidemia   . Chest pain     S/P nuclear 07/17/2008  normal; EF 88% with no ischemia  . Chronic venous insufficiency     BLE edema, chronic, neg venous US 2/13  . History of chicken pox   . Depression   . Diverticulitis   . Polyp of colon   . Osteoporosis, postmenopausal 06/02/2011  . Esophageal stricture     s/p dilation 04/2012  . GERD (gastroesophageal reflux disease)   . Stroke 07/03/2014   Past Surgical History  Procedure Laterality Date  . Appendectomy      age 32  . Tubal ligation    . Polypectomy    . Colonoscopy    . Upper gastrointestinal endoscopy     Social History   Social History  . Marital Status:  Widowed    Spouse Name: N/A  . Number of Children: N/A  . Years of Education: N/A   Social History Main Topics  . Smoking status: Never Smoker   . Smokeless tobacco: Never Used  . Alcohol Use: No  . Drug Use: No  . Sexual Activity: Not Asked   Other Topics Concern  . None   Social History Narrative   She lives alone in [place] home, but stays on the first floor.   Family History  Problem Relation Age of Onset  . Cancer Son     testicular cancer  . Colon cancer Neg Hx   . Esophageal cancer Neg Hx   . Rectal cancer Neg Hx   . Stomach cancer Neg Hx   . Breast cancer Sister   . Skin cancer Sister    Allergies  Allergen Reactions  . Celebrex [Celecoxib]     rash  . Lipitor [Atorvastatin Calcium]     Leg weakness,memory loss  . Lovastatin     Leg weakness  . Other     Always pads  . Latex Rash   Prior to Admission medications   Medication Sig Start Date End Date Taking? Authorizing Provider  acetaminophen (TYLENOL) 500 MG tablet Take 1 tablet (500 mg total) by mouth every 8 (eight) hours as needed (for pain). 08/09/14  Yes Darlin Coco,  MD  ALPRAZolam (XANAX) 1 MG tablet TAKE 1 TABLET TWICE DAILY. 11/12/14  Yes Rowe Clack, MD  Cholecalciferol (VITAMIN D PO) Take 1 tablet by mouth daily.    Yes Historical Provider, MD  clobetasol ointment (TEMOVATE) 3.97 % Apply 1 application topically 2 (two) times daily.  02/03/12  Yes Historical Provider, MD  clopidogrel (PLAVIX) 75 MG tablet TAKE 1 TABLET ONCE DAILY. 09/03/14  Yes Darlin Coco, MD  cyanocobalamin (,VITAMIN B-12,) 1000 MCG/ML injection INJECT 1 ML INTO THE MUSCLE EVERY 30 DAYS. 01/11/14  Yes Rowe Clack, MD  diclofenac sodium (VOLTAREN) 1 % GEL Apply  To feet 3 times daily prn for pain 05/20/14  Yes Donika K Patel, DO  furosemide (LASIX) 20 MG tablet TAKE 1 TABLET DAILY AS NEEDED. 11/11/14  Yes Olga Millers, MD  Glucosamine HCl 750 MG TABS Take 1 tablet by mouth 2 (two) times daily.    Yes  Historical Provider, MD  Misc Natural Products (TUMERSAID) TABS Take 1 capsule by mouth 2 (two) times daily. 03/01/13  Yes Lyndal Pulley, DO  nitroGLYCERIN (NITROSTAT) 0.4 MG SL tablet Place 1 tablet (0.4 mg total) under the tongue every 5 (five) minutes as needed for chest pain. 07/20/13  Yes Darlin Coco, MD  nortriptyline (PAMELOR) 25 MG capsule TAKE 1 CAPSULE AT BEDTIME. 10/01/14  Yes Olga Millers, MD  pantoprazole (PROTONIX) 40 MG tablet TAKE 1 TABLET ONCE DAILY. 11/19/14  Yes Irene Shipper, MD  pravastatin (PRAVACHOL) 20 MG tablet 1/2 qd 11/07/14  Yes Hendricks Limes, MD  solifenacin (VESICARE) 10 MG tablet Take 10 mg by mouth daily as needed (to reduce number of bathroom visits during travel).   Yes Historical Provider, MD  triazolam (HALCION) 0.25 MG tablet Take 0.5 tablets (0.125 mg total) by mouth at bedtime as needed for sleep. 10/28/14  Yes Olga Millers, MD     ROS: The patient denies fevers,  night sweats, unintentional weight loss, chest pain, palpitations, wheezing, dyspnea on exertion, nausea, vomiting, abdominal pain, dysuria, hematuria, melena, numbness, weakness, or tingling.   All other systems have been reviewed and were otherwise negative with the exception of those mentioned in the HPI and as above.    PHYSICAL EXAM: Filed Vitals:   12/23/14 1911  BP: 140/75  Pulse: 93  Temp: 97.5 F (36.4 C)  Resp: 18   SpO2 Readings from Last 3 Encounters:  12/23/14 99%  11/13/14 93%  11/08/14 95%    Body mass index is 31.88 kg/(m^2).   General: Alert, no acute distress HEENT:  Normocephalic, atraumatic, oropharynx patent. EOMI, PERRLA, fundo exam grossly normal  Erythematous throat, no exudates, TM normal, + right max sinus tenderness, + erythematous/boggy nasal mucosa Cardiovascular:  Regular rate and rhythm, no rubs , gallops.  No pedal edema.  Respiratory: Clear to auscultation bilaterally.  No wheezes, rales, or rhonchi.  No cyanosis, no use of accessory  musculature Abdominal: No organomegaly, abdomen is soft and non-tender, positive bowel sounds. No masses. Skin: No rashes. Neurologic: Facial musculature symmetric. Psychiatric: Patient acts appropriately throughout our interaction. Lymphatic: No cervical or submandibular lymphadenopathy Musculoskeletal: Gait uses walker . No edema, tenderness in LE UE and LE 5/5 strength   LABS:    EKG/XRAY:   Primary read interpreted by Dr. Marin Comment at [facility]   ASSESSMENT/PLAN: Encounter Diagnoses  Name Primary?  . Acute maxillary sinusitis, recurrence not specified Yes  . Allergic rhinitis, unspecified allergic rhinitis type   . History of stroke  I think this may be poorly controlled allergies but she states she has tried everything She will be given amox and also medrol dose pack, both of which she has taken before If no sxs releif then need to address with neurology whom she will be seeiing thsi Friday for atypical HA for her and also abnormal CT scan, I have given them CT results but advise that there is a calcified meningioma but that may or maynot be of any significance sicne she had this done in June and they released her from the ER and currently she is nuerologically intact.  Gross sideeffects, risk and benefits, and alternatives of medications d/w patient. Patient is aware that all medications have potential sideeffects and we are unable to predict every sideeffect or drug-drug interaction that may occur.  Thao Le DO  12/23/2014 7:39 PM   12/26/14-Feeling better in the face and sinus but still has HA, she has no neuro sxs besides that , seeing neuro tomorrow.

## 2014-12-23 NOTE — Patient Instructions (Signed)

## 2014-12-27 ENCOUNTER — Encounter: Payer: Self-pay | Admitting: Neurology

## 2014-12-27 ENCOUNTER — Ambulatory Visit (INDEPENDENT_AMBULATORY_CARE_PROVIDER_SITE_OTHER): Payer: Medicare Other | Admitting: Neurology

## 2014-12-27 VITALS — BP 110/78 | HR 89 | Ht 60.0 in | Wt 164.5 lb

## 2014-12-27 DIAGNOSIS — I639 Cerebral infarction, unspecified: Secondary | ICD-10-CM

## 2014-12-27 DIAGNOSIS — G4486 Cervicogenic headache: Secondary | ICD-10-CM

## 2014-12-27 DIAGNOSIS — G608 Other hereditary and idiopathic neuropathies: Secondary | ICD-10-CM

## 2014-12-27 DIAGNOSIS — R51 Headache: Secondary | ICD-10-CM | POA: Diagnosis not present

## 2014-12-27 DIAGNOSIS — R2681 Unsteadiness on feet: Secondary | ICD-10-CM

## 2014-12-27 MED ORDER — TIZANIDINE HCL 2 MG PO CAPS: 2.0000 mg | ORAL_CAPSULE | Freq: Three times a day (TID) | ORAL | Status: DC | PRN

## 2014-12-27 NOTE — Patient Instructions (Addendum)
Start tizandine 2mg  every 8 hours as needed for headache  Continue prednisone taper Start home neck physiotherapy Call with an update in 2 weeks Return to clinic in 3-4 months

## 2014-12-27 NOTE — Progress Notes (Signed)
[facility]  Follow-up Visit   Date: 12/27/2014   Sarah [name] MRN: [id number] DOB: [birth year]   Interim History: Sarah [name] is a 81 y.o. right-handed Caucasian female with history of HTN, HPL, depression, esophageal strictures, and GERD returning to the clinic for follow-up of multifactorial gait instability, headaches, and stroke.  The patient was accompanied to the clinic by her son.  History of present illness: Leg weakness started slowly around May 2014 when she noticed difficulty with walking and tripping on things. She would stumble frequently, no falls.  She has associated bilateral achy knee pain and chronic back pain but had worsening pain in August 2014. Pain is achy located at the low back and does not radiate into her buttocks or legs. Forward flexion makes her pain worse and it is improved by laying down.   Follow-up 01/16/2013:  Neuropathy labs which all returned normal.  EMG was performed and shows moderate peripheral neuropathy of the legs and L3-L4 old radiculopathy.   Follow-up 04/18/2013:  In December, she saw Dr. Tamala Julian for back pain who found SI joint dysfunction and performed injection which some benefit. Because of ongoing leg weakness, she started using a cane in November 2014 and feels much more stable.  She also has a caregiver that comes 10-2p who helps her dress, prepare meals, and does her errands which she appreciates a lot.    Follow-up 10/09/2013:  She had an unwittnessed fall on 6/17 while transferring a few things to her dining table.  She hit the back of her head and bruised her right wrist.  Caregiver says that she is more forgetful than usual, especially with conversation.  Since her fall, she has had intermittent headaches, described as achy, occuring daily and last all day. It usually responds to tylenol.    Follow-up 11/20/2013:  She developed severe right foot pain and she podiatry who injected her foot, which helped  slightly. She applies icey hot to her feet which helps.   Follow-up 02/12/2014:  She continues to have of achy knee and foot pain and reports worsening low back pain.   She uses voltaren and tumeric which significantly helps.   No tingling, besides mild tingling involving the base of her foot. No interval falls, but does endorse worsening imbalance.  UPDATE 05/20/2014:  She completed PT and reports being able to climb stairs which was a huge improvement.  No falls or hospitalizations.  She continues to have balance problems and is very cautious when walking.  Denies any shooting, stabbing, or burning pain. She has musculoskeletal pain involving her right upper arm and feet, which responds to voltaren.  UPDATE 09/02/2014:  Patient had an interval hospitalization for acute stroke manifested with slurred speech which has since resolved.  She called for same-day appointment because of severe bifrontal headache since last week.  She went to the ED 6/2 where tramadol was given and provided relief. She is getting home PT and feels that she may have over exerted herself lately which could have triggered her migraine.  There is significant photophobia and phonophobia.  No nasuea or vomiting.  No other associated neurological symptoms.  UPDATE 09/20/2014:  She is so relieved that her headaches improved with prednisone.  She is doing well and has not had any further headaches, slurred speech, or falls.  She is complaining of new chest wall pain, especially when she reaches for objects.  There is mild tenderness if she pushes on her sternum.  UPDATE  12/27/2014:   She started having headaches several weeks ago which are bifrontal and throbbing. She went to urgent care on 9/26 where she was givne prednisone and amoxicillin for possible sinusitis. She tried excedrin migraine did not provide any relief.  No worsening of headache when laying flat, with sneezing/coughing, and she denies any vision changes.  Headache is similar  to what she has experienced in the past.    Medications:  Current Outpatient Prescriptions on File Prior to Visit  Medication Sig Dispense Refill  . acetaminophen (TYLENOL) 500 MG tablet Take 1 tablet (500 mg total) by mouth every 8 (eight) hours as needed (for pain). 180 tablet 2  . ALPRAZolam (XANAX) 1 MG tablet TAKE 1 TABLET TWICE DAILY. 60 tablet 1  . amoxicillin (AMOXIL) 500 MG capsule Take 1 capsule (500 mg total) by mouth 2 (two) times daily. 14 capsule 0  . Cholecalciferol (VITAMIN D PO) Take 1 tablet by mouth daily.     . clobetasol ointment (TEMOVATE) 6.96 % Apply 1 application topically 2 (two) times daily.     . clopidogrel (PLAVIX) 75 MG tablet TAKE 1 TABLET ONCE DAILY. 30 tablet 6  . cyanocobalamin (,VITAMIN B-12,) 1000 MCG/ML injection INJECT 1 ML INTO THE MUSCLE EVERY 30 DAYS. 10 mL 5  . diclofenac sodium (VOLTAREN) 1 % GEL Apply  To feet 3 times daily prn for pain 1 Tube 5  . furosemide (LASIX) 20 MG tablet TAKE 1 TABLET DAILY AS NEEDED. 30 tablet 0  . Glucosamine HCl 750 MG TABS Take 1 tablet by mouth 2 (two) times daily.     . methylPREDNISolone (MEDROL DOSEPAK) 4 MG TBPK tablet Take as directed 21 tablet 0  . Misc Natural Products (TUMERSAID) TABS Take 1 capsule by mouth 2 (two) times daily. 60 tablet 3  . nitroGLYCERIN (NITROSTAT) 0.4 MG SL tablet Place 1 tablet (0.4 mg total) under the tongue every 5 (five) minutes as needed for chest pain. 100 tablet prn  . nortriptyline (PAMELOR) 25 MG capsule TAKE 1 CAPSULE AT BEDTIME. 30 capsule 2  . pantoprazole (PROTONIX) 40 MG tablet TAKE 1 TABLET ONCE DAILY. 30 tablet 1  . pravastatin (PRAVACHOL) 20 MG tablet 1/2 qd 45 tablet 1  . solifenacin (VESICARE) 10 MG tablet Take 10 mg by mouth daily as needed (to reduce number of bathroom visits during travel).    . triazolam (HALCION) 0.25 MG tablet Take 0.5 tablets (0.125 mg total) by mouth at bedtime as needed for sleep. 90 tablet 0   No current facility-administered medications on  file prior to visit.    Allergies:  Allergies  Allergen Reactions  . Celebrex [Celecoxib]     rash  . Lipitor [Atorvastatin Calcium]     Leg weakness,memory loss  . Lovastatin     Leg weakness  . Macrobid [employer] Macro]   . Other     Always pads  . Latex Rash     Review of Systems:  CONSTITUTIONAL: No fevers, chills, night sweats, or weight loss.   EYES: No visual changes or eye pain ENT: No hearing changes.  No history of nose bleeds. RESPIRATORY: No cough, wheezing and shortness of breath.   CARDIOVASCULAR: Negative for chest pain, and palpitations.   GI: Negative for abdominal discomfort, blood in stools or black stools.  No recent change in bowel habits.  GU:  No history of incontinence.   MUSCLOSKELETAL: No history of joint pain or swelling.  No myalgias.  + back pain SKIN: Negative for  lesions, rash, and itching.   HEMATOLOGY/ONCOLOGY: Negative for prolonged bleeding, bruising easily, and swollen nodes.   ENDOCRINE: Negative for cold or heat intolerance, polydipsia or goiter.   PSYCH:  No depression or anxiety symptoms.   NEURO: As Above.   Vital Signs:  BP 110/78 mmHg  Pulse 89  Ht 5' (1.524 m)  Wt 164 lb 8 oz (74.617 kg)  BMI 32.13 kg/m2  SpO2 98%  Neurological Exam: MENTAL STATUS including orientation to time, place, person, recent and remote memory is fairly intact.  Slightly blunted affect. No dysarthria. Repetition intact  CRANIAL NERVES:  Pupils round and reactive to light. Extraocular muscles are intact. Face is symmetric. Palate elevates symmetrically and tongue is midline.  MOTOR:  Motor strength is 5/5 in proximal muscles. Tone is normal throughout.    SENSORY:  Absent vibration distal to ankles  GAIT:  Stable assisted with walker and slow   Data: XR lumbar spine 06/07/2012:  Curvature convex to the left. Worsening of degenerative disc disease at T12-L1, L1-2 and L4-5.   Labs 12/06/2012:  TSH 5.43 Labs 12/15/2012:  Vitamin B12  426, MMA 0.22, ceruloplasmin 29, copper 114 SPEP/UPEP with IFE - no monoclonal gammopathy Labs 01/16/2013:  HbA1c 5.9  EMG 01/15/2013:  Taken together, these findings are consistent with a moderate generalized, large fiber, sensorimotor polyneuropathy affecting the left side. There is a superimposed intraspinal canal lesion (i.e. radiculopathy) affecting the L2-L4 nerve root/segments bilaterally, moderate in degree electrically.  CT brain 10/15/2013: No skull fracture or intracranial hemorrhage. Fluid level within the right maxillary sinus may be related to sinus  disease without secondary findings of orbital injury. If orbital injury were of clinical concern, CT orbits recommended for further  delineation. Prominent small vessel disease type changes without CT evidence of large acute infarct.  Mild global atrophy without hydrocephalus.  MRI/A Brain 07/03/2014: 1. Acute nonhemorrhagic 21 mm white matter infarct of the posterior right frontal lobe. 2. Extensive atrophy and diffuse white matter disease corresponds to advanced chronic microvascular ischemia. 3. Moderate to high-grade stenosis of the infra posterior right M2 branch. 4. Moderate small vessel disease is evident on the MRA. 5. High-grade stenosis in the proximal left P2 segment.  US carotids 07/04/2014:  1-39% ICA stenosis bilaterally TTE 07/04/2014:  Within normal limits, no thrombus   IMPRESSION/PLAN:   1.  Chronic tension headaches   - Recently worsening, previously responded to prednisone taper pack   - She is being treated for sinusitis with prednisone and amoxicillin  - Start tizandine 2mg  q8h prn as abortive therapy  - Start home neck PT  - Consider increasing nortriptyline if no improvement  2.  Peripheral neuropathy Continue nortriptyline 25mg  qhs   4.  Recent ischemic stroke manifesting with slurred speech due to intracranial stenosis MRI/A brain from April 2016 showed acute right posterior frontal stroke and  intracranial stenosis R M2 (high grade) and proximal left P2 (high grade) Continue plavix 75mg  and pravastatin 20mg  (LDL 77) Continue home speech therapy Stroke warning signs discussed  Return to clinic in 70-months   The duration of this appointment visit was 30 minutes of face-to-face time with the patient.  Greater than 50% of this time was spent in counseling, explanation of diagnosis, planning of further management, and coordination of care.   Thank you for allowing me to participate in patient's care.  If I can answer any additional questions, I would be pleased to do so.    Sincerely,    Donika K. Posey Pronto,  DO

## 2014-12-30 ENCOUNTER — Telehealth: Payer: Self-pay | Admitting: *Deleted

## 2014-12-30 DIAGNOSIS — I69398 Other sequelae of cerebral infarction: Secondary | ICD-10-CM | POA: Diagnosis not present

## 2014-12-30 DIAGNOSIS — I69320 Aphasia following cerebral infarction: Secondary | ICD-10-CM | POA: Diagnosis not present

## 2014-12-30 DIAGNOSIS — I6931 Cognitive deficits following cerebral infarction: Secondary | ICD-10-CM | POA: Diagnosis not present

## 2014-12-30 DIAGNOSIS — M6281 Muscle weakness (generalized): Secondary | ICD-10-CM | POA: Diagnosis not present

## 2014-12-30 DIAGNOSIS — I10 Essential (primary) hypertension: Secondary | ICD-10-CM | POA: Diagnosis not present

## 2014-12-30 DIAGNOSIS — M170 Bilateral primary osteoarthritis of knee: Secondary | ICD-10-CM | POA: Diagnosis not present

## 2014-12-30 NOTE — Telephone Encounter (Signed)
Please have her increase nortriptyline to 50mg  (take two tablets of 25mg ) at bedtime.  Tell her to watch for side effects of sleepiness or lightheadedness.

## 2014-12-30 NOTE — Telephone Encounter (Signed)
Patient called and is still having terrible headaches.  I informed her that we will be starting neck PT and would like to see if this will help.  She said that she wanted to know if there was anything else to try.  She has been in bed all day trying to stay still.  Please advise.

## 2014-12-30 NOTE — Telephone Encounter (Signed)
Patient has been given instructions and agrees with plan.

## 2015-01-01 ENCOUNTER — Other Ambulatory Visit: Payer: Self-pay | Admitting: Internal Medicine

## 2015-01-02 ENCOUNTER — Ambulatory Visit: Payer: Medicare Other

## 2015-01-02 ENCOUNTER — Telehealth: Payer: Self-pay | Admitting: Neurology

## 2015-01-02 ENCOUNTER — Encounter: Payer: Self-pay | Admitting: Neurology

## 2015-01-02 DIAGNOSIS — D519 Vitamin B12 deficiency anemia, unspecified: Secondary | ICD-10-CM | POA: Diagnosis not present

## 2015-01-02 DIAGNOSIS — I872 Venous insufficiency (chronic) (peripheral): Secondary | ICD-10-CM | POA: Diagnosis not present

## 2015-01-02 DIAGNOSIS — M6281 Muscle weakness (generalized): Secondary | ICD-10-CM | POA: Diagnosis not present

## 2015-01-02 DIAGNOSIS — M170 Bilateral primary osteoarthritis of knee: Secondary | ICD-10-CM | POA: Diagnosis not present

## 2015-01-02 DIAGNOSIS — M47817 Spondylosis without myelopathy or radiculopathy, lumbosacral region: Secondary | ICD-10-CM | POA: Diagnosis not present

## 2015-01-02 DIAGNOSIS — I69320 Aphasia following cerebral infarction: Secondary | ICD-10-CM | POA: Diagnosis not present

## 2015-01-02 DIAGNOSIS — F329 Major depressive disorder, single episode, unspecified: Secondary | ICD-10-CM | POA: Diagnosis not present

## 2015-01-02 DIAGNOSIS — I2510 Atherosclerotic heart disease of native coronary artery without angina pectoris: Secondary | ICD-10-CM | POA: Diagnosis not present

## 2015-01-02 DIAGNOSIS — Z602 Problems related to living alone: Secondary | ICD-10-CM | POA: Diagnosis not present

## 2015-01-02 DIAGNOSIS — G629 Polyneuropathy, unspecified: Secondary | ICD-10-CM | POA: Diagnosis not present

## 2015-01-02 DIAGNOSIS — I10 Essential (primary) hypertension: Secondary | ICD-10-CM | POA: Diagnosis not present

## 2015-01-02 DIAGNOSIS — Z9181 History of falling: Secondary | ICD-10-CM | POA: Diagnosis not present

## 2015-01-02 DIAGNOSIS — J019 Acute sinusitis, unspecified: Secondary | ICD-10-CM | POA: Diagnosis not present

## 2015-01-02 DIAGNOSIS — I69398 Other sequelae of cerebral infarction: Secondary | ICD-10-CM | POA: Diagnosis not present

## 2015-01-02 DIAGNOSIS — F419 Anxiety disorder, unspecified: Secondary | ICD-10-CM | POA: Diagnosis not present

## 2015-01-02 NOTE — Telephone Encounter (Signed)
Called patient back and spoke with Sarah [name].  She said that the patient did not take the zanaflex today because that is what is making her weak so she will no longer be taking it.  They are going to stay at the same nortriptyline dose for now.  Instructed her to call with any other problems.

## 2015-01-02 NOTE — Telephone Encounter (Signed)
I called patient back and she said her arms and legs are very weak.  She is wondering if it is being caused by the increase in nortriptyline.  She said that her head feels better as long as she has a pain pill.  I told her that I would check with the doctor and call her back.

## 2015-01-02 NOTE — Telephone Encounter (Signed)
PT called and stated that she is having severe weakness in her legs and wants to know if there is something she can take/Dawn CB# [phone]

## 2015-01-02 NOTE — Telephone Encounter (Signed)
Did she also take zanaflex (tizanidine) which is a muscle relaxant?  Please ask her to stop this medication, if she did take it because it may be causing her weakness.   If she did not take zanaflex, please ask her if she would be interested in taking lower dose of the nortriptyline such as 40mg  - if she agrees, she will need a new Rx for 10mg  tablets - take 4 at bedtime.

## 2015-01-06 DIAGNOSIS — M170 Bilateral primary osteoarthritis of knee: Secondary | ICD-10-CM | POA: Diagnosis not present

## 2015-01-06 DIAGNOSIS — I872 Venous insufficiency (chronic) (peripheral): Secondary | ICD-10-CM | POA: Diagnosis not present

## 2015-01-06 DIAGNOSIS — G629 Polyneuropathy, unspecified: Secondary | ICD-10-CM | POA: Diagnosis not present

## 2015-01-06 DIAGNOSIS — M47817 Spondylosis without myelopathy or radiculopathy, lumbosacral region: Secondary | ICD-10-CM | POA: Diagnosis not present

## 2015-01-06 DIAGNOSIS — I69398 Other sequelae of cerebral infarction: Secondary | ICD-10-CM | POA: Diagnosis not present

## 2015-01-06 DIAGNOSIS — M6281 Muscle weakness (generalized): Secondary | ICD-10-CM | POA: Diagnosis not present

## 2015-01-07 ENCOUNTER — Ambulatory Visit (INDEPENDENT_AMBULATORY_CARE_PROVIDER_SITE_OTHER): Payer: Medicare Other | Admitting: Internal Medicine

## 2015-01-07 ENCOUNTER — Encounter: Payer: Self-pay | Admitting: Internal Medicine

## 2015-01-07 VITALS — BP 142/74 | HR 88 | Temp 97.9°F | Resp 12 | Ht 60.0 in | Wt 169.0 lb

## 2015-01-07 DIAGNOSIS — E538 Deficiency of other specified B group vitamins: Secondary | ICD-10-CM

## 2015-01-07 DIAGNOSIS — I639 Cerebral infarction, unspecified: Secondary | ICD-10-CM | POA: Diagnosis not present

## 2015-01-07 DIAGNOSIS — Z23 Encounter for immunization: Secondary | ICD-10-CM | POA: Diagnosis not present

## 2015-01-07 DIAGNOSIS — M542 Cervicalgia: Secondary | ICD-10-CM

## 2015-01-07 NOTE — Patient Instructions (Signed)
We have given you the B12 shot and the flu shot today.   Keep using the voltaren gel on the spots that hurt as it is likely a muscle strain. Keep working with physical therapist for the neck pain.

## 2015-01-07 NOTE — Progress Notes (Signed)
Pre visit review using our clinic review tool, if applicable. No additional management support is needed unless otherwise documented below in the visit note. 

## 2015-01-08 MED ORDER — CYANOCOBALAMIN 1000 MCG/ML IJ SOLN
1000.0000 ug | Freq: Once | INTRAMUSCULAR | Status: AC
  Administered 2015-01-07: 1000 ug via INTRAMUSCULAR

## 2015-01-09 ENCOUNTER — Other Ambulatory Visit: Payer: Self-pay | Admitting: Internal Medicine

## 2015-01-09 NOTE — Telephone Encounter (Signed)
MD out of office pls advise on refill.../lmb 

## 2015-01-09 NOTE — Telephone Encounter (Signed)
Faxed script to [facility]...[name]

## 2015-01-10 DIAGNOSIS — M47817 Spondylosis without myelopathy or radiculopathy, lumbosacral region: Secondary | ICD-10-CM | POA: Diagnosis not present

## 2015-01-10 DIAGNOSIS — I69398 Other sequelae of cerebral infarction: Secondary | ICD-10-CM | POA: Diagnosis not present

## 2015-01-10 DIAGNOSIS — G629 Polyneuropathy, unspecified: Secondary | ICD-10-CM | POA: Diagnosis not present

## 2015-01-10 DIAGNOSIS — M542 Cervicalgia: Secondary | ICD-10-CM | POA: Insufficient documentation

## 2015-01-10 DIAGNOSIS — M6281 Muscle weakness (generalized): Secondary | ICD-10-CM | POA: Diagnosis not present

## 2015-01-10 DIAGNOSIS — I872 Venous insufficiency (chronic) (peripheral): Secondary | ICD-10-CM | POA: Diagnosis not present

## 2015-01-10 DIAGNOSIS — M170 Bilateral primary osteoarthritis of knee: Secondary | ICD-10-CM | POA: Diagnosis not present

## 2015-01-10 NOTE — Assessment & Plan Note (Signed)
B12 injection given today at visit.

## 2015-01-10 NOTE — Progress Notes (Signed)
   Subjective:    Patient ID: Sarah [name], female    DOB: October 23, 1926, 81 y.o.   MRN: [id number]  HPI The patient is an 81 YO female coming in for neck pain and headache. She thought that it was related to her sinuses but took antibiotics for a sinus infection which helped her sinuses but not the headaches. She has started working with PT for her neck and this has started to help. She is using some voltaren gel on the neck which is also partially helpful. Using tylenol OTC for pain. No numbness or radiation of the pain to her arms. No jaw pain or chest pains. No SOB.   Review of Systems  Constitutional: Negative for fever, activity change, appetite change and fatigue.  Eyes: Negative.   Respiratory: Negative for cough, chest tightness, shortness of breath and wheezing.   Cardiovascular: Negative for chest pain, palpitations and leg swelling.  Gastrointestinal: Negative for abdominal pain, diarrhea and constipation.  Musculoskeletal: Positive for neck pain. Negative for arthralgias.  Skin: Negative.   Neurological: Positive for weakness, numbness and headaches. Negative for dizziness.       No change  Psychiatric/Behavioral: Negative.       Objective:   Physical Exam  Constitutional: She is oriented to person, place, and time. She appears well-developed and well-nourished.  HENT:  Head: Normocephalic and atraumatic.  Right Ear: External ear normal.  Left Ear: External ear normal.  Eyes: EOM are normal.  Neck: Normal range of motion.  Cardiovascular: Normal rate and regular rhythm.   Pulmonary/Chest: Effort normal and breath sounds normal. No respiratory distress. She has no wheezes.  Abdominal: Soft. She exhibits no distension. There is no tenderness. There is no rebound.  Musculoskeletal: She exhibits tenderness. She exhibits no edema.  Mild tenderness in the left neck muscles without radiation  Neurological: She is alert and oriented to person, place, and time.  Skin: Skin is  warm and dry.  Psychiatric: She has a normal mood and affect.   Filed Vitals:   01/07/15 1438  BP: 142/74  Pulse: 88  Temp: 97.9 F (36.6 C)  TempSrc: Oral  Resp: 12  Height: 5' (1.524 m)  Weight: 169 lb (76.658 kg)  SpO2: 94%      Assessment & Plan:  B12 and flu shot given at visit.

## 2015-01-10 NOTE — Assessment & Plan Note (Signed)
Advised to continue using rx voltaren gel for pain as well as tylenol. She can also use heating pad and continue with PT for the pain. No indication for imaging at today's visit. No red flag signs.

## 2015-01-13 DIAGNOSIS — I69398 Other sequelae of cerebral infarction: Secondary | ICD-10-CM | POA: Diagnosis not present

## 2015-01-13 DIAGNOSIS — M170 Bilateral primary osteoarthritis of knee: Secondary | ICD-10-CM | POA: Diagnosis not present

## 2015-01-13 DIAGNOSIS — M6281 Muscle weakness (generalized): Secondary | ICD-10-CM | POA: Diagnosis not present

## 2015-01-13 DIAGNOSIS — G629 Polyneuropathy, unspecified: Secondary | ICD-10-CM | POA: Diagnosis not present

## 2015-01-13 DIAGNOSIS — M47817 Spondylosis without myelopathy or radiculopathy, lumbosacral region: Secondary | ICD-10-CM | POA: Diagnosis not present

## 2015-01-13 DIAGNOSIS — I872 Venous insufficiency (chronic) (peripheral): Secondary | ICD-10-CM | POA: Diagnosis not present

## 2015-01-14 ENCOUNTER — Other Ambulatory Visit: Payer: Self-pay | Admitting: Internal Medicine

## 2015-01-15 ENCOUNTER — Ambulatory Visit (INDEPENDENT_AMBULATORY_CARE_PROVIDER_SITE_OTHER): Payer: Medicare Other | Admitting: Internal Medicine

## 2015-01-15 ENCOUNTER — Encounter: Payer: Self-pay | Admitting: Internal Medicine

## 2015-01-15 VITALS — BP 124/74 | HR 76 | Ht 60.0 in | Wt 168.0 lb

## 2015-01-15 DIAGNOSIS — K219 Gastro-esophageal reflux disease without esophagitis: Secondary | ICD-10-CM | POA: Diagnosis not present

## 2015-01-15 DIAGNOSIS — K222 Esophageal obstruction: Secondary | ICD-10-CM

## 2015-01-15 DIAGNOSIS — I639 Cerebral infarction, unspecified: Secondary | ICD-10-CM

## 2015-01-15 DIAGNOSIS — R1310 Dysphagia, unspecified: Secondary | ICD-10-CM | POA: Diagnosis not present

## 2015-01-15 DIAGNOSIS — I63311 Cerebral infarction due to thrombosis of right middle cerebral artery: Secondary | ICD-10-CM | POA: Diagnosis not present

## 2015-01-15 NOTE — Patient Instructions (Signed)
We have sent the following medications to your pharmacy for you to pick up at your convenience:  Pantoprazole  Please follow up with Dr. Henrene Pastor in 2 years.

## 2015-01-15 NOTE — Progress Notes (Signed)
HISTORY OF PRESENT ILLNESS:  Sarah [name] is a 81 y.o. female who has been followed in this office for history of adenomatous colon polyps, GERD complicated by peptic stricture requiring esophageal dilation, and iron deficiency anemia. She presents today with a chief complaint of ongoing management of chronic GERD and the need for medication refill. She is accompanied today by her nurses aide. Patient was last evaluated in the office 04/14/2012 after having had a food impaction relieved endoscopically at [facility] the month prior. Thus, on 05/05/2012 she underwent upper endoscopy. She was found to have a distal esophageal stricture (tandem) which was dilated with 15 mm balloon. She was continued on PPI thereafter. Patient has been taking pantoprazole 40 mg daily with good control of reflux symptoms. She has had decline in her overall health in the past 6 months including an acute right brain cerebrovascular accident April 2016 with resultant left-sided deficits. She is now on chronic Plavix alone. She does report some dysphagia for which she chews her food very carefully. She has had no further food impaction or transient impaction. No lower GI complaints. Her last complete colonoscopy in 2007 was remarkable for diverticulosis and diminutive adenoma. Review of laboratories from August 2016 reveals hemoglobin 11.6 with MCV 92.9.  REVIEW OF SYSTEMS:  All non-GI ROS negative except for arthritis, visual change, confusion, cough, depression, fatigue, headaches, hearing problems, ankle edema, increased thirst, urinary leakage, shortness of breath  Past Medical History  Diagnosis Date  . Osteoarthritis   . Bladder incontinence   . Constipation     hx of  . Hypertension   . Hyperlipidemia   . Chest pain     S/P nuclear 07/17/2008  normal; EF 88% with no ischemia  . Chronic venous insufficiency     BLE edema, chronic, neg venous US 2/13  . History of chicken pox   . Depression   . Diverticulitis   .  Polyp of colon   . Osteoporosis, postmenopausal 06/02/2011  . Esophageal stricture     s/p dilation 04/2012  . GERD (gastroesophageal reflux disease)   . Stroke [facility]) 07/03/2014    Past Surgical History  Procedure Laterality Date  . Appendectomy      age 49  . Tubal ligation    . Polypectomy    . Colonoscopy    . Upper gastrointestinal endoscopy      Social History RAISA DITTO  reports that she has never smoked. She has never used smokeless tobacco. She reports that she does not drink alcohol or use illicit drugs.  family history includes Breast cancer in her sister; Cancer in her son; Skin cancer in her sister. There is no history of Colon cancer, Esophageal cancer, Rectal cancer, or Stomach cancer.  Allergies  Allergen Reactions  . Celebrex [Celecoxib]     rash  . Lipitor [Atorvastatin Calcium]     Leg weakness,memory loss  . Lovastatin     Leg weakness  . Macrobid [employer] Macro]   . Other     Always pads  . Latex Rash       PHYSICAL EXAMINATION: Vital signs: BP 124/74 mmHg  Pulse 76  Ht 5' (1.524 m)  Wt 168 lb (76.204 kg)  BMI 32.81 kg/m2 General: Frail elderly female, no acute distress. Has Walker HEENT: Sclerae are anicteric, conjunctiva pink. Oral mucosa intact Lungs: Clear Heart: Regular Abdomen: soft, obese, nontender, nondistended, no obvious ascites, no peritoneal signs, normal bowel sounds. No organomegaly. Extremities: No clubbing or cyanosis. 1+  edema bilaterally at the ankles Neuro: Subtle weakness of the left upper extremity and left lower extremity (4/5) Skin: No jaundice. Bruising on the upper extremities Psychiatric: alert and oriented x3. Cooperative   ASSESSMENT:  #1. Chronic GERD. GERD symptoms controlled with PPI #2. History of peptic stricture with recurrent food impaction. Having dysphagia currently. She does not feel it is severe. She is not interested in esophageal dilation. She would be very high risk for dilation  given her history of stroke and the need to address Plavix. We discussed this issue in detail   PLAN:  #1. Reflux precautions #2. Continue pantoprazole 40 mg daily. Prescription refilled  #3. Chew food extremely well #4. Routine follow-up 2 years. Sooner if needed  25 was spent face-to-face with the patient. Greater than 50% of the time used for counseling regarding her above diagnoses and recommended plan.

## 2015-01-17 DIAGNOSIS — I69398 Other sequelae of cerebral infarction: Secondary | ICD-10-CM | POA: Diagnosis not present

## 2015-01-17 DIAGNOSIS — M47817 Spondylosis without myelopathy or radiculopathy, lumbosacral region: Secondary | ICD-10-CM | POA: Diagnosis not present

## 2015-01-17 DIAGNOSIS — I872 Venous insufficiency (chronic) (peripheral): Secondary | ICD-10-CM | POA: Diagnosis not present

## 2015-01-17 DIAGNOSIS — G629 Polyneuropathy, unspecified: Secondary | ICD-10-CM | POA: Diagnosis not present

## 2015-01-17 DIAGNOSIS — M170 Bilateral primary osteoarthritis of knee: Secondary | ICD-10-CM | POA: Diagnosis not present

## 2015-01-17 DIAGNOSIS — M6281 Muscle weakness (generalized): Secondary | ICD-10-CM | POA: Diagnosis not present

## 2015-01-20 DIAGNOSIS — G629 Polyneuropathy, unspecified: Secondary | ICD-10-CM | POA: Diagnosis not present

## 2015-01-20 DIAGNOSIS — M47817 Spondylosis without myelopathy or radiculopathy, lumbosacral region: Secondary | ICD-10-CM | POA: Diagnosis not present

## 2015-01-20 DIAGNOSIS — M6281 Muscle weakness (generalized): Secondary | ICD-10-CM | POA: Diagnosis not present

## 2015-01-20 DIAGNOSIS — M170 Bilateral primary osteoarthritis of knee: Secondary | ICD-10-CM | POA: Diagnosis not present

## 2015-01-20 DIAGNOSIS — I69398 Other sequelae of cerebral infarction: Secondary | ICD-10-CM | POA: Diagnosis not present

## 2015-01-20 DIAGNOSIS — I872 Venous insufficiency (chronic) (peripheral): Secondary | ICD-10-CM | POA: Diagnosis not present

## 2015-01-21 ENCOUNTER — Other Ambulatory Visit: Payer: Self-pay | Admitting: Internal Medicine

## 2015-01-21 NOTE — Telephone Encounter (Signed)
Have never filled or discussed with her. Not on her med list from our last 2 visits. Would need visit to discuss.

## 2015-01-22 ENCOUNTER — Other Ambulatory Visit: Payer: Self-pay | Admitting: Internal Medicine

## 2015-01-22 NOTE — Telephone Encounter (Signed)
This was refused yesterday. The 2 times I have seen her she has not mentioned and it has not been on her medication list. She needs visit to discuss.

## 2015-01-23 ENCOUNTER — Telehealth: Payer: Self-pay | Admitting: Neurology

## 2015-01-23 NOTE — Telephone Encounter (Signed)
Pt needs a call back after 10am 01/24/15//call back @ [phone]

## 2015-01-24 DIAGNOSIS — M6281 Muscle weakness (generalized): Secondary | ICD-10-CM | POA: Diagnosis not present

## 2015-01-24 DIAGNOSIS — G629 Polyneuropathy, unspecified: Secondary | ICD-10-CM | POA: Diagnosis not present

## 2015-01-24 DIAGNOSIS — I872 Venous insufficiency (chronic) (peripheral): Secondary | ICD-10-CM | POA: Diagnosis not present

## 2015-01-24 DIAGNOSIS — M170 Bilateral primary osteoarthritis of knee: Secondary | ICD-10-CM | POA: Diagnosis not present

## 2015-01-24 DIAGNOSIS — M47817 Spondylosis without myelopathy or radiculopathy, lumbosacral region: Secondary | ICD-10-CM | POA: Diagnosis not present

## 2015-01-24 DIAGNOSIS — I69398 Other sequelae of cerebral infarction: Secondary | ICD-10-CM | POA: Diagnosis not present

## 2015-01-24 NOTE — Telephone Encounter (Signed)
Please have her follow-up with PCP for evaluation, she may need her sodium levels checked and evaluate for UTI.

## 2015-01-24 NOTE — Telephone Encounter (Signed)
Patient given instructions and agreed with plan.  

## 2015-01-24 NOTE — Telephone Encounter (Signed)
Called patient back and she said that her arms and legs are very weak.  She said that she is still doing PT twice a week.  She was wondering if you had any suggestions for the weakness.

## 2015-01-27 ENCOUNTER — Other Ambulatory Visit: Payer: Self-pay | Admitting: Internal Medicine

## 2015-01-27 DIAGNOSIS — M170 Bilateral primary osteoarthritis of knee: Secondary | ICD-10-CM | POA: Diagnosis not present

## 2015-01-27 DIAGNOSIS — M47817 Spondylosis without myelopathy or radiculopathy, lumbosacral region: Secondary | ICD-10-CM | POA: Diagnosis not present

## 2015-01-27 DIAGNOSIS — I872 Venous insufficiency (chronic) (peripheral): Secondary | ICD-10-CM | POA: Diagnosis not present

## 2015-01-27 DIAGNOSIS — G629 Polyneuropathy, unspecified: Secondary | ICD-10-CM | POA: Diagnosis not present

## 2015-01-27 DIAGNOSIS — I69398 Other sequelae of cerebral infarction: Secondary | ICD-10-CM | POA: Diagnosis not present

## 2015-01-27 DIAGNOSIS — M6281 Muscle weakness (generalized): Secondary | ICD-10-CM | POA: Diagnosis not present

## 2015-01-29 ENCOUNTER — Ambulatory Visit (INDEPENDENT_AMBULATORY_CARE_PROVIDER_SITE_OTHER): Payer: Medicare Other | Admitting: Internal Medicine

## 2015-01-29 ENCOUNTER — Other Ambulatory Visit (INDEPENDENT_AMBULATORY_CARE_PROVIDER_SITE_OTHER): Payer: Medicare Other

## 2015-01-29 ENCOUNTER — Encounter: Payer: Self-pay | Admitting: Internal Medicine

## 2015-01-29 VITALS — BP 118/70 | HR 103 | Temp 98.4°F | Resp 16 | Ht 60.0 in | Wt 169.0 lb

## 2015-01-29 DIAGNOSIS — M810 Age-related osteoporosis without current pathological fracture: Secondary | ICD-10-CM

## 2015-01-29 DIAGNOSIS — N3946 Mixed incontinence: Secondary | ICD-10-CM

## 2015-01-29 DIAGNOSIS — E559 Vitamin D deficiency, unspecified: Secondary | ICD-10-CM

## 2015-01-29 DIAGNOSIS — W19XXXS Unspecified fall, sequela: Secondary | ICD-10-CM

## 2015-01-29 DIAGNOSIS — R531 Weakness: Secondary | ICD-10-CM | POA: Diagnosis not present

## 2015-01-29 DIAGNOSIS — I639 Cerebral infarction, unspecified: Secondary | ICD-10-CM | POA: Diagnosis not present

## 2015-01-29 DIAGNOSIS — E538 Deficiency of other specified B group vitamins: Secondary | ICD-10-CM

## 2015-01-29 LAB — VITAMIN D 25 HYDROXY (VIT D DEFICIENCY, FRACTURES): VITD: 45.17 ng/mL (ref 30.00–100.00)

## 2015-01-29 LAB — CBC
HEMATOCRIT: 35.7 % — AB (ref 36.0–46.0)
Hemoglobin: 11.8 g/dL — ABNORMAL LOW (ref 12.0–15.0)
MCHC: 33.2 g/dL (ref 30.0–36.0)
MCV: 93.2 fl (ref 78.0–100.0)
Platelets: 319 10*3/uL (ref 150.0–400.0)
RBC: 3.83 Mil/uL — ABNORMAL LOW (ref 3.87–5.11)
RDW: 14 % (ref 11.5–15.5)
WBC: 4.9 10*3/uL (ref 4.0–10.5)

## 2015-01-29 LAB — COMPREHENSIVE METABOLIC PANEL
ALT: 13 U/L (ref 0–35)
AST: 8 U/L (ref 0–37)
Albumin: 3.8 g/dL (ref 3.5–5.2)
Alkaline Phosphatase: 47 U/L (ref 39–117)
BUN: 12 mg/dL (ref 6–23)
CHLORIDE: 96 meq/L (ref 96–112)
CO2: 31 meq/L (ref 19–32)
CREATININE: 0.79 mg/dL (ref 0.40–1.20)
Calcium: 9.1 mg/dL (ref 8.4–10.5)
GFR: 73.27 mL/min (ref >60.00)
Glucose, Bld: 105 mg/dL — ABNORMAL HIGH (ref 70–99)
Potassium: 4.4 mEq/L (ref 3.5–5.1)
Sodium: 132 mEq/L — ABNORMAL LOW (ref 135–145)
Total Bilirubin: 0.3 mg/dL (ref 0.2–1.2)
Total Protein: 6.6 g/dL (ref 6.0–8.3)

## 2015-01-29 LAB — VITAMIN B12: Vitamin B-12: 318 pg/mL (ref 211–911)

## 2015-01-29 LAB — TSH: TSH: 4.89 u[IU]/mL — ABNORMAL HIGH (ref 0.35–4.50)

## 2015-01-29 MED ORDER — SULFAMETHOXAZOLE-TRIMETHOPRIM 800-160 MG PO TABS: 1.0000 | ORAL_TABLET | Freq: Two times a day (BID) | ORAL | Status: DC

## 2015-01-29 NOTE — Patient Instructions (Signed)
We will check the blood work today and call you back with the results.   We will treat you for urine infection with bactrim. Take 1 pill twice a day with food for 5 days to clear an infection.

## 2015-01-29 NOTE — Progress Notes (Signed)
Pre visit review using our clinic review tool, if applicable. No additional management support is needed unless otherwise documented below in the visit note. 

## 2015-01-31 ENCOUNTER — Telehealth: Payer: Self-pay | Admitting: Internal Medicine

## 2015-01-31 DIAGNOSIS — G629 Polyneuropathy, unspecified: Secondary | ICD-10-CM | POA: Diagnosis not present

## 2015-01-31 DIAGNOSIS — M6281 Muscle weakness (generalized): Secondary | ICD-10-CM | POA: Diagnosis not present

## 2015-01-31 DIAGNOSIS — I872 Venous insufficiency (chronic) (peripheral): Secondary | ICD-10-CM | POA: Diagnosis not present

## 2015-01-31 DIAGNOSIS — I69398 Other sequelae of cerebral infarction: Secondary | ICD-10-CM | POA: Diagnosis not present

## 2015-01-31 DIAGNOSIS — M170 Bilateral primary osteoarthritis of knee: Secondary | ICD-10-CM | POA: Diagnosis not present

## 2015-01-31 DIAGNOSIS — M47817 Spondylosis without myelopathy or radiculopathy, lumbosacral region: Secondary | ICD-10-CM | POA: Diagnosis not present

## 2015-01-31 NOTE — Telephone Encounter (Signed)
Inform pt of the result.

## 2015-01-31 NOTE — Telephone Encounter (Signed)
Left message for patient to call back  

## 2015-01-31 NOTE — Assessment & Plan Note (Signed)
Concern that the acute worsening is related to UTI. She was not able to give sample today so will treat with bactrim. If symptoms not improved will try again for collection. Concern also that this could be worsening her balance.

## 2015-01-31 NOTE — Progress Notes (Signed)
   Subjective:    Patient ID: Sarah [name], female    DOB: 1926-09-20, 81 y.o.   MRN: [id number]  HPI The patient is a 81 YO female coming in for possible UTI. She is having more incontinence. No pain or burning. She was walking down the stairs to the basement with boxes in her hands. She did fall in the basement. She denies hitting her head. Denies LOC. Was able to crawl over to the staircase and get upstairs. Balance is poor due to her parkinson's but stable. Not always using her walker lately due to feeling of better balance.   Review of Systems  Constitutional: Negative for fever, activity change, appetite change and fatigue.  Eyes: Negative.   Respiratory: Negative for cough, chest tightness, shortness of breath and wheezing.   Cardiovascular: Negative for chest pain, palpitations and leg swelling.  Gastrointestinal: Negative for abdominal pain, diarrhea and constipation.  Genitourinary: Positive for dysuria and urgency.  Musculoskeletal: Positive for myalgias and neck pain. Negative for arthralgias and neck stiffness.  Skin: Negative.   Neurological: Positive for weakness, numbness and headaches. Negative for dizziness.       No change  Psychiatric/Behavioral: Negative.       Objective:   Physical Exam  Constitutional: She is oriented to person, place, and time. She appears well-developed and well-nourished.  HENT:  Head: Normocephalic and atraumatic.  Right Ear: External ear normal.  Left Ear: External ear normal.  Eyes: EOM are normal.  Neck: Normal range of motion.  Cardiovascular: Normal rate and regular rhythm.   Pulmonary/Chest: Effort normal and breath sounds normal. No respiratory distress. She has no wheezes.  Abdominal: Soft. She exhibits no distension. There is no tenderness. There is no rebound.  Musculoskeletal: She exhibits no edema.  Neurological: She is alert and oriented to person, place, and time.  Skin: Skin is warm and dry.  No overt bruising.     Psychiatric: She has a normal mood and affect.   Filed Vitals:   01/29/15 1107  BP: 118/70  Pulse: 103  Temp: 98.4 F (36.9 C)  TempSrc: Oral  Resp: 16  Height: 5' (1.524 m)  Weight: 169 lb (76.658 kg)  SpO2: 96%      Assessment & Plan:

## 2015-01-31 NOTE — Telephone Encounter (Signed)
Please call patient with lab results

## 2015-01-31 NOTE — Assessment & Plan Note (Signed)
No obvious injury and did not seek care after injury. No imaging needed today. Reinforced strongly the need for her to not go into the basement without assistance even if she thinks it is important due to risk of serious injury with fall.

## 2015-02-03 DIAGNOSIS — I69398 Other sequelae of cerebral infarction: Secondary | ICD-10-CM | POA: Diagnosis not present

## 2015-02-03 DIAGNOSIS — G629 Polyneuropathy, unspecified: Secondary | ICD-10-CM | POA: Diagnosis not present

## 2015-02-03 DIAGNOSIS — M6281 Muscle weakness (generalized): Secondary | ICD-10-CM | POA: Diagnosis not present

## 2015-02-03 DIAGNOSIS — I872 Venous insufficiency (chronic) (peripheral): Secondary | ICD-10-CM | POA: Diagnosis not present

## 2015-02-03 DIAGNOSIS — M170 Bilateral primary osteoarthritis of knee: Secondary | ICD-10-CM | POA: Diagnosis not present

## 2015-02-03 DIAGNOSIS — M47817 Spondylosis without myelopathy or radiculopathy, lumbosacral region: Secondary | ICD-10-CM | POA: Diagnosis not present

## 2015-02-05 ENCOUNTER — Encounter: Payer: Self-pay | Admitting: Family Medicine

## 2015-02-05 ENCOUNTER — Ambulatory Visit (INDEPENDENT_AMBULATORY_CARE_PROVIDER_SITE_OTHER): Payer: Medicare Other | Admitting: Family Medicine

## 2015-02-05 ENCOUNTER — Other Ambulatory Visit (INDEPENDENT_AMBULATORY_CARE_PROVIDER_SITE_OTHER): Payer: Medicare Other

## 2015-02-05 VITALS — BP 116/64 | HR 97 | Ht 60.0 in | Wt 169.0 lb

## 2015-02-05 DIAGNOSIS — M47818 Spondylosis without myelopathy or radiculopathy, sacral and sacrococcygeal region: Secondary | ICD-10-CM

## 2015-02-05 DIAGNOSIS — M4698 Unspecified inflammatory spondylopathy, sacral and sacrococcygeal region: Secondary | ICD-10-CM

## 2015-02-05 DIAGNOSIS — I639 Cerebral infarction, unspecified: Secondary | ICD-10-CM

## 2015-02-05 NOTE — Progress Notes (Signed)
CC: Back pain followup  HPI: Patient is a very pleasant 81 year old female coming in with chronic back pain. Patient was treated for severe sacroiliac joint arthritis. It has been greater than 3 months since patient's last injection. Patient since last exam though did have a hospitalization when patient did have a CVA.  Patient states overall she is starting have worsening back pain again. Seems to be located over the sacroiliac joint. Patient states when her back hurt she starts getting more pain on her knees. States though that she is even had any recent fall. Patient is kind of weaker she states. Patient states that she is having to do more rehabilitation. Not eating as much she was previously. Denies any fevers, chills, or any abnormal weight loss.    Previous imaging studies XR lumbar spine 06/07/2012: Findings: There is curvature convex to the left. There is advanced degenerative disc disease at T12-L1, L1-2 and L4-5. The L5-S1 level is transitional. There is anterolisthesis of 3 mm of L4-5.  There is retrolisthesis of 3 mm at T12-L1 and L1-2. These degenerative changes have worsened since 2010. No evidence of fracture.  IMPRESSION:  Curvature convex to the left. Worsening of degenerative disc disease at T12-L1, L1-2 and L4-5.   Past medical, surgical, family and social history reviewed. Medications reviewed all in the electronic medical record.   Review of Systems: No headache, visual changes, nausea, vomiting, diarrhea, constipation, dizziness, abdominal pain, skin rash, fevers, chills, night sweats, weight loss, swollen lymph nodes, body aches, joint swelling, muscle aches, chest pain, shortness of breath, mood changes.   Objective:    Blood pressure 116/64, pulse 97, height 5' (1.524 m), weight 169 lb (76.658 kg), SpO2 96 %.   General: No apparent distress alert and oriented x3 mood and affect normal, dressed appropriately.  HEENT: Pupils equal, extraocular movements  intact Respiratory: Patient's speak in full sentences and does not appear short of breath Cardiovascular: No lower extremity edema, non tender, no erythema Skin: Warm dry intact with no signs of infection or rash on extremities or on axial skeleton. Abdomen: Soft nontender Neuro: Cranial nerves II through XII are intact, neurovascularly intact in all extremities with 2+ DTRs and 2+ pulses. Lymph: No lymphadenopathy of posterior or anterior cervical chain or axillae bilaterally.  Gait walks with a walker and shuffling gait MSK: Non tender with full range of motion and good stability and symmetric strength and tone of shoulders, elbows, wrist, hip, and ankles bilaterally. Moderate loss of range of motion of multiple joints secondary to osteoporosis. Shoulder: Right Inspection reveals no abnormalities, atrophy or asymmetry. Palpation is normal with no tenderness over AC joint or bicipital groove. ROM is full in all planes passively. Rotator cuff strength normal throughout. signs of impingement with positive Neer and Hawkin's tests, but negative empty can sign. Speeds and Yergason's tests normal. No labral pathology noted with negative Obrien's, negative clunk and good stability. Normal scapular function observed. No painful arc and no drop arm sign. No apprehension sign Contralateral shoulder has some mild crepitus and impingement as well but not as severe as the right side. Mild improvement from previous exam  Back Exam:  Inspection:increase kyphosis as well as some scoliosis Motion: Flexion 35 deg, Extension 25 deg, Side Bending to 25 deg bilaterally,  Rotation to 25 deg bilaterally  SLR laying: pain but no radiation  XSLR laying: Negative  Palpable tenderness: Severe tenderness over the right sacroiliac joint FABER: unable for patient to participate. Sensory change: Gross sensation intact to  all lumbar and sacral dermatomes.  Reflexes: 2+ at both patellar tendons, 2+ at achilles  tendons, Babinski's downgoing.  Strength at foot  Plantar-flexion: 5/5 Dorsi-flexion: 5/5 Eversion: 5/5 Inversion: 5/5  Leg strength  Quad: 4/5 Hamstring: 4/5 Hip flexor: 4/5 Hip abductors: 3/5  But symmetric to contralateral side.   Procedure: Real-time Ultrasound Guided Injection of left sacroiliac joint Device: GE Logiq E  Ultrasound guided injection is preferred based studies that show increased duration, increased effect, greater accuracy, decreased procedural pain, increased response rate, and decreased cost with ultrasound guided versus blind injection.  Verbal informed consent obtained.  Time-out conducted.  Noted no overlying erythema, induration, or other signs of local infection.  Skin prepped in a sterile fashion.  Local anesthesia: Topical Ethyl chloride.  With sterile technique and under real time ultrasound guidance:  With a 22-gauge 3-1/2 inch needle patient was injected with 1 mL of 0.5% Marcaine and 1 mL of Kenalog 40 mg/dL into the joint specified. Completed without difficulty  Pain immediately resolved suggesting accurate placement of the medication.  Advised to call if fevers/chills, erythema, induration, drainage, or persistent bleeding.  Images permanently stored and available for review in the ultrasound unit.  Impression: Technically successful ultrasound guided injection.      Impression and Recommendations:     This case required medical decision making of moderate complexity.

## 2015-02-05 NOTE — Assessment & Plan Note (Signed)
Given another injection today. Develop a change in medication secondary to her other comorbidities. We discussed the possibility of patient having worsening peripheral neuropathy. Patient will continue with all her other medications. Discuss have any weakness occurs to call us again and we will further evaluate. Patient otherwise will continue do the same activities that she is doing at this time.  Spent  25 minutes with patient face-to-face and had greater than 50% of counseling including as described above in assessment and plan.

## 2015-02-05 NOTE — Progress Notes (Signed)
Pre visit review using our clinic review tool, if applicable. No additional management support is needed unless otherwise documented below in the visit note. 

## 2015-02-05 NOTE — Patient Instructions (Signed)
Good to see you Stay active Ice can always help pennsaid pinkie amount topically 2 times daily as needed.  See me again in 10-12 weeks Happy holidays!

## 2015-02-12 ENCOUNTER — Ambulatory Visit (INDEPENDENT_AMBULATORY_CARE_PROVIDER_SITE_OTHER): Payer: Medicare Other | Admitting: Cardiology

## 2015-02-12 ENCOUNTER — Ambulatory Visit
Admission: RE | Admit: 2015-02-12 | Discharge: 2015-02-12 | Disposition: A | Discharge status: Home / Self Care | Payer: Medicare Other | Source: Ambulatory Visit | Attending: Cardiology | Admitting: Cardiology

## 2015-02-12 ENCOUNTER — Encounter: Payer: Self-pay | Admitting: Cardiology

## 2015-02-12 VITALS — BP 120/70 | HR 89 | Ht 60.0 in | Wt 165.4 lb

## 2015-02-12 DIAGNOSIS — R0609 Other forms of dyspnea: Principal | ICD-10-CM

## 2015-02-12 DIAGNOSIS — I119 Hypertensive heart disease without heart failure: Secondary | ICD-10-CM | POA: Diagnosis not present

## 2015-02-12 DIAGNOSIS — I639 Cerebral infarction, unspecified: Secondary | ICD-10-CM | POA: Diagnosis not present

## 2015-02-12 DIAGNOSIS — R609 Edema, unspecified: Secondary | ICD-10-CM

## 2015-02-12 DIAGNOSIS — R0600 Dyspnea, unspecified: Secondary | ICD-10-CM | POA: Diagnosis not present

## 2015-02-12 NOTE — Progress Notes (Signed)
Cardiology Office Note   Date:  02/12/2015   ID:  Sarah [name], DOB Apr 16, 1926, MRN [id number]  PCP:  Hoyt Koch, MD  Cardiologist: Darlin Coco MD  Chief Complaint  Patient presents with  . Chest Pain      History of Present Illness: Sarah [name] is a 81 y.o. female who presents for a six-month follow-up office visit   She has a past history of atypical chest pain. She does not have any history of ischemic heart disease. She had a normal nuclear stress test in April 2012. She has a normal left ventricular function with ejection fraction of 88%. She does have a history of hypercholesterolemia but is intolerant of statins because of leg weakness and memory loss. Since last visit she is been stable from a cardiac standpoint. She continues to have occasional chest discomfort relieved by sublingual nitroglycerin. She has been diagnosed by her neurologist Dr. Posey Pronto with neuropathy. It affects her feet. She is no longer allowed to drive. She has a woman that comes to her home and drives her to appointments and does her shopping for her etc. The patient lives alone at night but seems to be managing okay. In July 2016 the patient had a right brain stroke. Now on plavix. She has physical therapy and speech therapy and home health coming out to her home. She also has a housekeeper to help her with meal preparation and housekeeping etc. she does not want to go to a retirement center if possible. Her right brain stroke cause some slurring of speech. Her new medications are Plavix and she is no longer on aspirin and she is now on pravastatin in low dose. Previously she could not tolerate Lipitor or lovastatin. Patient has been experiencing some intermittent peripheral edema.  She also has been having more exertional dyspnea.  Her last chest x-ray was in April 2016.  It was mildly abnormal.  We will get a follow-up chest x-ray at this time.  Past Medical History    Diagnosis Date  . Osteoarthritis   . Bladder incontinence   . Constipation     hx of  . Hypertension   . Hyperlipidemia   . Chest pain     S/P nuclear 07/17/2008  normal; EF 88% with no ischemia  . Chronic venous insufficiency     BLE edema, chronic, neg venous US 2/13  . History of chicken pox   . Depression   . Diverticulitis   . Polyp of colon   . Osteoporosis, postmenopausal 06/02/2011  . Esophageal stricture     s/p dilation 04/2012  . GERD (gastroesophageal reflux disease)   . Stroke [facility]) 07/03/2014    Past Surgical History  Procedure Laterality Date  . Appendectomy      age 44  . Tubal ligation    . Polypectomy    . Colonoscopy    . Upper gastrointestinal endoscopy       Current Outpatient Prescriptions  Medication Sig Dispense Refill  . acetaminophen (TYLENOL) 500 MG tablet Take 1 tablet (500 mg total) by mouth every 8 (eight) hours as needed (for pain). 180 tablet 2  . ALPRAZolam (XANAX) 1 MG tablet Take 1 mg by mouth 2 (two) times daily.    . Cholecalciferol (VITAMIN D PO) Take 1 tablet by mouth daily.     . clobetasol ointment (TEMOVATE) [id number] % Apply 1 application topically 2 (two) times daily.     . clopidogrel (PLAVIX) 75 MG tablet Take  75 mg by mouth daily.    . cyanocobalamin (,VITAMIN B-12,) 1000 MCG/ML injection INJECT 1 ML INTO THE MUSCLE EVERY 30 DAYS. 10 mL 5  . diclofenac sodium (VOLTAREN) 1 % GEL Apply  To feet 3 times daily prn for pain 1 Tube 5  . furosemide (LASIX) 20 MG tablet Take 20 mg by mouth daily as needed (edema).    . Glucosamine HCl 750 MG TABS Take 1 tablet by mouth 2 (two) times daily.     . Misc Natural Products (TUMERSAID) TABS Take 1 capsule by mouth 2 (two) times daily. 60 tablet 3  . nitroGLYCERIN (NITROSTAT) 0.4 MG SL tablet Place 1 tablet (0.4 mg total) under the tongue every 5 (five) minutes as needed for chest pain. 100 tablet prn  . nortriptyline (PAMELOR) 25 MG capsule Take 25 mg by mouth at bedtime.    . pantoprazole  (PROTONIX) 40 MG tablet Take 40 mg by mouth daily.    Marland Kitchen perphenazine (TRILAFON) 2 MG tablet Take 2 mg by mouth at bedtime.    . pravastatin (PRAVACHOL) 20 MG tablet Take 10 mg by mouth daily.    . solifenacin (VESICARE) 10 MG tablet Take 10 mg by mouth daily as needed (to reduce number of bathroom visits during travel).    Marland Kitchen sulfamethoxazole-trimethoprim (BACTRIM DS,SEPTRA DS) 800-160 MG tablet Take 1 tablet by mouth 2 (two) times daily. 10 tablet 0  . triazolam (HALCION) 0.25 MG tablet Take 0.5 tablets (0.125 mg total) by mouth at bedtime as needed for sleep. 90 tablet 0   No current facility-administered medications for this visit.    Allergies:   Celebrex; Lipitor; Lovastatin; Macrobid; Other; and Latex    Social History:  The patient  reports that she has never smoked. She has never used smokeless tobacco. She reports that she does not drink alcohol or use illicit drugs.   Family History:  The patient's family history includes Breast cancer in her sister; Cancer in her son; Skin cancer in her sister. There is no history of Colon cancer, Esophageal cancer, Rectal cancer, or Stomach cancer.    ROS:  Please see the history of present illness.   Otherwise, review of systems are positive for none.   All other systems are reviewed and negative.    PHYSICAL EXAM: VS:  BP 120/70 mmHg  Pulse 89  Ht 5' (1.524 m)  Wt 165 lb 6.4 oz (75.025 kg)  BMI 32.30 kg/m2 , BMI Body mass index is 32.3 kg/(m^2). GEN: Well nourished, well developed, in no acute distress HEENT: normal Neck: no JVD, carotid bruits, or masses Cardiac: RRR; no murmurs, rubs, or gallops,no edema  Respiratory:  clear to auscultation bilaterally, normal work of breathing GI: soft, nontender, nondistended, + BS MS: no deformity or atrophy Skin: warm and dry, no rash Neuro:  Strength and sensation are intact Psych: euthymic mood, full affect   EKG:  EKG is ordered today. The ekg ordered today demonstrates normal sinus  rhythm at 89 bpm.  Nonspecific ST-T changes.  No acute changes.   Recent Labs: 01/29/2015: ALT 13; BUN 12; Creatinine, Ser 0.79; Hemoglobin 11.8*; Platelets 319.0; Potassium 4.4; Sodium 132*; TSH 4.89*    Lipid Panel    Component Value Date/Time   CHOL 163 08/06/2014 0948   TRIG 87.0 08/06/2014 0948   HDL 68.70 08/06/2014 0948   CHOLHDL 2 08/06/2014 0948   VLDL 17.4 08/06/2014 0948   LDLCALC 77 08/06/2014 0948   LDLDIRECT 101.1 10/21/2010 0954  Wt Readings from Last 3 Encounters:  02/12/15 165 lb 6.4 oz (75.025 kg)  02/05/15 169 lb (76.658 kg)  01/29/15 169 lb (76.658 kg)         ASSESSMENT AND PLAN:  1.hypercholesterolemia, intolerant of statins.Now on trial of pravastatin 2. Chest pain responsive to sublingual nitroglycerin. No previous objective evidence of ischemic heart disease. 3. Neuropathy followed by neurologist 4. History of vitamin B-12 deficiency receiving monthly B12 shots 5. Recent right brain thrombotic stroke, now on plavix 6.  Dyspnea on exertion  Disposition: Continue on same medication. Recheck here in 6 months for office visit.  With Dr. Marlou Porch.  Get chest x-ray to evaluate complaints of exertional dyspnea   Current medicines are reviewed at length with the patient today.  The patient does not have concerns regarding medicines.  The following changes have been made:  no change  Labs/ tests ordered today include:   Orders Placed This Encounter  Procedures  . DG Chest 2 View  . EKG 12-Lead     Disposition:    Berna Spare MD 02/12/2015 2:41 PM    [facility] [address], [place], [place]  [address] Phone: [phone]; Fax: [phone]

## 2015-02-12 NOTE — Patient Instructions (Signed)
Medication Instructions:  Your physician recommends that you continue on your current medications as directed. Please refer to the Current Medication list given to you today.  Labwork: none  Testing/Procedures: A chest x-ray takes a picture of the organs and structures inside the chest, including the heart, lungs, and blood vessels. This test can show several things, including, whether the heart is enlarges; whether fluid is building up in the lungs; and whether pacemaker / defibrillator leads are still in place. [facility] [place]  Follow-Up: Your physician wants you to follow-up in: [place] will receive a reminder letter in the mail two months in advance. If you don't receive a letter, please call our office to schedule the follow-up appointment.  If you need a refill on your cardiac medications before your next appointment, please call your pharmacy.

## 2015-02-14 DIAGNOSIS — M170 Bilateral primary osteoarthritis of knee: Secondary | ICD-10-CM | POA: Diagnosis not present

## 2015-02-14 DIAGNOSIS — I872 Venous insufficiency (chronic) (peripheral): Secondary | ICD-10-CM | POA: Diagnosis not present

## 2015-02-14 DIAGNOSIS — G629 Polyneuropathy, unspecified: Secondary | ICD-10-CM | POA: Diagnosis not present

## 2015-02-14 DIAGNOSIS — M6281 Muscle weakness (generalized): Secondary | ICD-10-CM | POA: Diagnosis not present

## 2015-02-14 DIAGNOSIS — I69398 Other sequelae of cerebral infarction: Secondary | ICD-10-CM | POA: Diagnosis not present

## 2015-02-14 DIAGNOSIS — M47817 Spondylosis without myelopathy or radiculopathy, lumbosacral region: Secondary | ICD-10-CM | POA: Diagnosis not present

## 2015-02-17 ENCOUNTER — Other Ambulatory Visit: Payer: Self-pay | Admitting: Internal Medicine

## 2015-02-17 DIAGNOSIS — I69398 Other sequelae of cerebral infarction: Secondary | ICD-10-CM | POA: Diagnosis not present

## 2015-02-17 DIAGNOSIS — I872 Venous insufficiency (chronic) (peripheral): Secondary | ICD-10-CM | POA: Diagnosis not present

## 2015-02-17 DIAGNOSIS — M170 Bilateral primary osteoarthritis of knee: Secondary | ICD-10-CM | POA: Diagnosis not present

## 2015-02-17 DIAGNOSIS — M6281 Muscle weakness (generalized): Secondary | ICD-10-CM | POA: Diagnosis not present

## 2015-02-17 DIAGNOSIS — M47817 Spondylosis without myelopathy or radiculopathy, lumbosacral region: Secondary | ICD-10-CM | POA: Diagnosis not present

## 2015-02-17 DIAGNOSIS — G629 Polyneuropathy, unspecified: Secondary | ICD-10-CM | POA: Diagnosis not present

## 2015-02-17 NOTE — Telephone Encounter (Signed)
Faxed script back to [facility] [place]...[name]

## 2015-02-24 DIAGNOSIS — I872 Venous insufficiency (chronic) (peripheral): Secondary | ICD-10-CM | POA: Diagnosis not present

## 2015-02-24 DIAGNOSIS — M47817 Spondylosis without myelopathy or radiculopathy, lumbosacral region: Secondary | ICD-10-CM | POA: Diagnosis not present

## 2015-02-24 DIAGNOSIS — M6281 Muscle weakness (generalized): Secondary | ICD-10-CM | POA: Diagnosis not present

## 2015-02-24 DIAGNOSIS — I69398 Other sequelae of cerebral infarction: Secondary | ICD-10-CM | POA: Diagnosis not present

## 2015-02-24 DIAGNOSIS — M170 Bilateral primary osteoarthritis of knee: Secondary | ICD-10-CM | POA: Diagnosis not present

## 2015-02-24 DIAGNOSIS — G629 Polyneuropathy, unspecified: Secondary | ICD-10-CM | POA: Diagnosis not present

## 2015-02-26 ENCOUNTER — Other Ambulatory Visit: Payer: Self-pay | Admitting: Internal Medicine

## 2015-02-27 ENCOUNTER — Other Ambulatory Visit: Payer: Self-pay | Admitting: Geriatric Medicine

## 2015-02-27 MED ORDER — NORTRIPTYLINE HCL 25 MG PO CAPS: ORAL_CAPSULE | ORAL | Status: DC

## 2015-02-28 DIAGNOSIS — I872 Venous insufficiency (chronic) (peripheral): Secondary | ICD-10-CM | POA: Diagnosis not present

## 2015-02-28 DIAGNOSIS — M6281 Muscle weakness (generalized): Secondary | ICD-10-CM | POA: Diagnosis not present

## 2015-02-28 DIAGNOSIS — M170 Bilateral primary osteoarthritis of knee: Secondary | ICD-10-CM | POA: Diagnosis not present

## 2015-02-28 DIAGNOSIS — I69398 Other sequelae of cerebral infarction: Secondary | ICD-10-CM | POA: Diagnosis not present

## 2015-02-28 DIAGNOSIS — G629 Polyneuropathy, unspecified: Secondary | ICD-10-CM | POA: Diagnosis not present

## 2015-02-28 DIAGNOSIS — M47817 Spondylosis without myelopathy or radiculopathy, lumbosacral region: Secondary | ICD-10-CM | POA: Diagnosis not present

## 2015-03-03 ENCOUNTER — Ambulatory Visit (INDEPENDENT_AMBULATORY_CARE_PROVIDER_SITE_OTHER): Payer: Medicare Other | Admitting: Internal Medicine

## 2015-03-03 ENCOUNTER — Encounter: Payer: Self-pay | Admitting: Internal Medicine

## 2015-03-03 VITALS — BP 128/64 | HR 93 | Temp 97.6°F | Resp 12 | Ht 60.0 in | Wt 168.0 lb

## 2015-03-03 DIAGNOSIS — M159 Polyosteoarthritis, unspecified: Secondary | ICD-10-CM

## 2015-03-03 DIAGNOSIS — E538 Deficiency of other specified B group vitamins: Secondary | ICD-10-CM | POA: Diagnosis not present

## 2015-03-03 DIAGNOSIS — R413 Other amnesia: Secondary | ICD-10-CM

## 2015-03-03 DIAGNOSIS — N3946 Mixed incontinence: Secondary | ICD-10-CM

## 2015-03-03 DIAGNOSIS — M47817 Spondylosis without myelopathy or radiculopathy, lumbosacral region: Secondary | ICD-10-CM | POA: Diagnosis not present

## 2015-03-03 DIAGNOSIS — M170 Bilateral primary osteoarthritis of knee: Secondary | ICD-10-CM | POA: Diagnosis not present

## 2015-03-03 DIAGNOSIS — I2510 Atherosclerotic heart disease of native coronary artery without angina pectoris: Secondary | ICD-10-CM | POA: Diagnosis not present

## 2015-03-03 DIAGNOSIS — I69320 Aphasia following cerebral infarction: Secondary | ICD-10-CM | POA: Diagnosis not present

## 2015-03-03 DIAGNOSIS — I872 Venous insufficiency (chronic) (peripheral): Secondary | ICD-10-CM | POA: Diagnosis not present

## 2015-03-03 DIAGNOSIS — Z602 Problems related to living alone: Secondary | ICD-10-CM | POA: Diagnosis not present

## 2015-03-03 DIAGNOSIS — D519 Vitamin B12 deficiency anemia, unspecified: Secondary | ICD-10-CM | POA: Diagnosis not present

## 2015-03-03 DIAGNOSIS — I10 Essential (primary) hypertension: Secondary | ICD-10-CM | POA: Diagnosis not present

## 2015-03-03 DIAGNOSIS — I639 Cerebral infarction, unspecified: Secondary | ICD-10-CM

## 2015-03-03 DIAGNOSIS — Z9181 History of falling: Secondary | ICD-10-CM | POA: Diagnosis not present

## 2015-03-03 DIAGNOSIS — F329 Major depressive disorder, single episode, unspecified: Secondary | ICD-10-CM | POA: Diagnosis not present

## 2015-03-03 DIAGNOSIS — M150 Primary generalized (osteo)arthritis: Secondary | ICD-10-CM

## 2015-03-03 DIAGNOSIS — G629 Polyneuropathy, unspecified: Secondary | ICD-10-CM | POA: Diagnosis not present

## 2015-03-03 DIAGNOSIS — M6281 Muscle weakness (generalized): Secondary | ICD-10-CM | POA: Diagnosis not present

## 2015-03-03 DIAGNOSIS — I69398 Other sequelae of cerebral infarction: Secondary | ICD-10-CM | POA: Diagnosis not present

## 2015-03-03 DIAGNOSIS — F419 Anxiety disorder, unspecified: Secondary | ICD-10-CM | POA: Diagnosis not present

## 2015-03-03 MED ORDER — CYANOCOBALAMIN 1000 MCG/ML IJ SOLN
1000.0000 ug | Freq: Once | INTRAMUSCULAR | Status: AC
  Administered 2015-03-03: 1000 ug via INTRAMUSCULAR

## 2015-03-03 NOTE — Progress Notes (Signed)
Pre visit review using our clinic review tool, if applicable. No additional management support is needed unless otherwise documented below in the visit note. 

## 2015-03-03 NOTE — Patient Instructions (Signed)
We have given you the B12 shot today today.   Try to use the bathroom about every 2 hours to keep your bladder empty as it may have some neuropathy that causes you to not be able to hold your urine well.   We have given you the handicapped sticker application today and the wheelchair prescription.   Your back is curved some from the arthritis which is causing the right side to be higher. There is no danger from this.

## 2015-03-05 ENCOUNTER — Other Ambulatory Visit: Payer: Self-pay | Admitting: Internal Medicine

## 2015-03-05 DIAGNOSIS — M6281 Muscle weakness (generalized): Secondary | ICD-10-CM | POA: Diagnosis not present

## 2015-03-05 DIAGNOSIS — I69398 Other sequelae of cerebral infarction: Secondary | ICD-10-CM | POA: Diagnosis not present

## 2015-03-05 DIAGNOSIS — M170 Bilateral primary osteoarthritis of knee: Secondary | ICD-10-CM | POA: Diagnosis not present

## 2015-03-05 DIAGNOSIS — G629 Polyneuropathy, unspecified: Secondary | ICD-10-CM | POA: Diagnosis not present

## 2015-03-05 DIAGNOSIS — M47817 Spondylosis without myelopathy or radiculopathy, lumbosacral region: Secondary | ICD-10-CM | POA: Diagnosis not present

## 2015-03-05 DIAGNOSIS — I872 Venous insufficiency (chronic) (peripheral): Secondary | ICD-10-CM | POA: Diagnosis not present

## 2015-03-05 NOTE — Assessment & Plan Note (Signed)
Doing okay with tylenol and voltaren gel. She is not a candidate for oral NSAIDs. Talked to her about the need to stay mobile to help with decreasing progression.

## 2015-03-05 NOTE — Assessment & Plan Note (Signed)
Gets shots monthly and just had several weeks ago so none at visit today.

## 2015-03-05 NOTE — Assessment & Plan Note (Signed)
Would advise to decrease the lasix to once a week. Legs not swollen today off her lasix. She is taking vesicare which helps some. Hopefully decreasing lasix will help as well. Talked with her about decreasing fluid intake after 6PM.

## 2015-03-05 NOTE — Assessment & Plan Note (Signed)
They do not wish treatment at this time but there is some noticeable loss of memory and she is not always able to tell about her own health concerns. Her caretaker was with her at visit and always comes with her.

## 2015-03-05 NOTE — Assessment & Plan Note (Signed)
Doing okay with tylenol and voltaren gel. The voltaren is helping most and will continue. No recent falls although she does fall a lot at home. Talked to her about the need to use her walker all the time. Rx for lightweight wheelchair for longer distances so she can go out with her family.

## 2015-03-05 NOTE — Progress Notes (Signed)
   Subjective:    Patient ID: Sarah [name], female    DOB: [birth year], 81 y.o.   MRN: [id number]  HPI The patient is an 81 YO female coming in for follow up of several medical conditions including: memory problems (still doing about the same, not aware of many recent events, long term memory intact, does not drive anymore) her osteoarthritis (still having severe pain in her back and her hips, since her hip problems has some assymetry in her pelvis, taking tylenol using voltaren gel for the pain) and her incontinence (she does not take her lasix as this causes her incontinence often, usually better during the day and worse at night time). No new concerns.   Review of Systems  Constitutional: Negative for fever, activity change, appetite change and fatigue.  Eyes: Negative.   Respiratory: Negative for cough, chest tightness, shortness of breath and wheezing.   Cardiovascular: Negative for chest pain, palpitations and leg swelling.  Gastrointestinal: Negative for abdominal pain, diarrhea and constipation.  Genitourinary: Positive for urgency. Negative for dysuria.  Musculoskeletal: Positive for myalgias, back pain, arthralgias and neck pain. Negative for neck stiffness.  Skin: Negative.   Neurological: Positive for weakness, numbness and headaches. Negative for dizziness.       No change  Psychiatric/Behavioral: Negative.       Objective:   Physical Exam  Constitutional: She is oriented to person, place, and time. She appears well-developed and well-nourished.  HENT:  Head: Normocephalic and atraumatic.  Right Ear: External ear normal.  Left Ear: External ear normal.  Eyes: EOM are normal.  Neck: Normal range of motion.  Cardiovascular: Normal rate and regular rhythm.   Pulmonary/Chest: Effort normal and breath sounds normal. No respiratory distress. She has no wheezes.  Abdominal: Soft. She exhibits no distension. There is no tenderness. There is no rebound.  Musculoskeletal: She  exhibits no edema.  Tenderness in the low back and left thigh region  Neurological: She is alert and oriented to person, place, and time.  Skin: Skin is warm and dry.  Psychiatric: She has a normal mood and affect.   Filed Vitals:   03/03/15 1308  BP: 128/64  Pulse: 93  Temp: 97.6 F (36.4 C)  TempSrc: Oral  Resp: 12  Height: 5' (1.524 m)  Weight: 168 lb (76.204 kg)  SpO2: 98%      Assessment & Plan:

## 2015-03-07 DIAGNOSIS — I872 Venous insufficiency (chronic) (peripheral): Secondary | ICD-10-CM | POA: Diagnosis not present

## 2015-03-07 DIAGNOSIS — I69398 Other sequelae of cerebral infarction: Secondary | ICD-10-CM | POA: Diagnosis not present

## 2015-03-07 DIAGNOSIS — M6281 Muscle weakness (generalized): Secondary | ICD-10-CM | POA: Diagnosis not present

## 2015-03-07 DIAGNOSIS — G629 Polyneuropathy, unspecified: Secondary | ICD-10-CM | POA: Diagnosis not present

## 2015-03-07 DIAGNOSIS — M170 Bilateral primary osteoarthritis of knee: Secondary | ICD-10-CM | POA: Diagnosis not present

## 2015-03-07 DIAGNOSIS — M47817 Spondylosis without myelopathy or radiculopathy, lumbosacral region: Secondary | ICD-10-CM | POA: Diagnosis not present

## 2015-03-10 DIAGNOSIS — G629 Polyneuropathy, unspecified: Secondary | ICD-10-CM | POA: Diagnosis not present

## 2015-03-10 DIAGNOSIS — M6281 Muscle weakness (generalized): Secondary | ICD-10-CM | POA: Diagnosis not present

## 2015-03-10 DIAGNOSIS — I69398 Other sequelae of cerebral infarction: Secondary | ICD-10-CM | POA: Diagnosis not present

## 2015-03-10 DIAGNOSIS — M47817 Spondylosis without myelopathy or radiculopathy, lumbosacral region: Secondary | ICD-10-CM | POA: Diagnosis not present

## 2015-03-10 DIAGNOSIS — I872 Venous insufficiency (chronic) (peripheral): Secondary | ICD-10-CM | POA: Diagnosis not present

## 2015-03-10 DIAGNOSIS — M170 Bilateral primary osteoarthritis of knee: Secondary | ICD-10-CM | POA: Diagnosis not present

## 2015-03-17 DIAGNOSIS — I69398 Other sequelae of cerebral infarction: Secondary | ICD-10-CM | POA: Diagnosis not present

## 2015-03-17 DIAGNOSIS — M170 Bilateral primary osteoarthritis of knee: Secondary | ICD-10-CM | POA: Diagnosis not present

## 2015-03-17 DIAGNOSIS — I872 Venous insufficiency (chronic) (peripheral): Secondary | ICD-10-CM | POA: Diagnosis not present

## 2015-03-17 DIAGNOSIS — M6281 Muscle weakness (generalized): Secondary | ICD-10-CM | POA: Diagnosis not present

## 2015-03-17 DIAGNOSIS — G629 Polyneuropathy, unspecified: Secondary | ICD-10-CM | POA: Diagnosis not present

## 2015-03-17 DIAGNOSIS — M47817 Spondylosis without myelopathy or radiculopathy, lumbosacral region: Secondary | ICD-10-CM | POA: Diagnosis not present

## 2015-03-19 ENCOUNTER — Telehealth: Payer: Self-pay | Admitting: *Deleted

## 2015-03-19 ENCOUNTER — Other Ambulatory Visit: Payer: Self-pay | Admitting: Internal Medicine

## 2015-03-19 MED ORDER — CIPROFLOXACIN HCL 250 MG PO TABS: 250.0000 mg | ORAL_TABLET | Freq: Two times a day (BID) | ORAL | Status: DC

## 2015-03-19 NOTE — Telephone Encounter (Signed)
It would be ideal to have a urine sample. I have sent in Ciprofloxacin to the pharmacy. She needs to seek additional care if the symptoms are not improved or if fever or back pain develop.

## 2015-03-19 NOTE — Telephone Encounter (Signed)
Pt left msg on triage stating md rx antibiotic for UTI sxs. She has finish med, but don't think it has cleared up. Still having burning when urinating & frequency. Wanting to see if she can have another round antibiotic or something stronger...[name]

## 2015-03-19 NOTE — Telephone Encounter (Signed)
Left message informing patient that an antibiotic was sent in, but if she is not feeling better to call us back.

## 2015-03-19 NOTE — Telephone Encounter (Signed)
Spoke with patient. She states she is still in pain and is having urgency. She was treated for a UTI on 01/29/15, with bactrim. This is more than likely a whole new infection. She is using over the counter AZO for relief, but says she needs an antibiotic. Are you willing to send in an antibiotic? Her caregiver states that it is very hard to get the patient in to leave a sample. I explained that it may be necessary in order to treat accordingly. Please advise if you would go ahead and send something in, or if I need to place order for a urinalysis and have patient go to the lab. Thanks.

## 2015-03-25 DIAGNOSIS — M170 Bilateral primary osteoarthritis of knee: Secondary | ICD-10-CM | POA: Diagnosis not present

## 2015-03-25 DIAGNOSIS — I69398 Other sequelae of cerebral infarction: Secondary | ICD-10-CM | POA: Diagnosis not present

## 2015-03-25 DIAGNOSIS — M6281 Muscle weakness (generalized): Secondary | ICD-10-CM | POA: Diagnosis not present

## 2015-03-25 DIAGNOSIS — M47817 Spondylosis without myelopathy or radiculopathy, lumbosacral region: Secondary | ICD-10-CM | POA: Diagnosis not present

## 2015-03-28 DIAGNOSIS — I872 Venous insufficiency (chronic) (peripheral): Secondary | ICD-10-CM | POA: Diagnosis not present

## 2015-03-28 DIAGNOSIS — M47817 Spondylosis without myelopathy or radiculopathy, lumbosacral region: Secondary | ICD-10-CM | POA: Diagnosis not present

## 2015-03-28 DIAGNOSIS — M6281 Muscle weakness (generalized): Secondary | ICD-10-CM | POA: Diagnosis not present

## 2015-03-28 DIAGNOSIS — G629 Polyneuropathy, unspecified: Secondary | ICD-10-CM | POA: Diagnosis not present

## 2015-03-28 DIAGNOSIS — I69398 Other sequelae of cerebral infarction: Secondary | ICD-10-CM | POA: Diagnosis not present

## 2015-03-28 DIAGNOSIS — M170 Bilateral primary osteoarthritis of knee: Secondary | ICD-10-CM | POA: Diagnosis not present

## 2015-04-02 ENCOUNTER — Other Ambulatory Visit: Payer: Self-pay | Admitting: Cardiology

## 2015-04-04 DIAGNOSIS — M47817 Spondylosis without myelopathy or radiculopathy, lumbosacral region: Secondary | ICD-10-CM | POA: Diagnosis not present

## 2015-04-04 DIAGNOSIS — M170 Bilateral primary osteoarthritis of knee: Secondary | ICD-10-CM | POA: Diagnosis not present

## 2015-04-04 DIAGNOSIS — G629 Polyneuropathy, unspecified: Secondary | ICD-10-CM | POA: Diagnosis not present

## 2015-04-04 DIAGNOSIS — I69398 Other sequelae of cerebral infarction: Secondary | ICD-10-CM | POA: Diagnosis not present

## 2015-04-04 DIAGNOSIS — M6281 Muscle weakness (generalized): Secondary | ICD-10-CM | POA: Diagnosis not present

## 2015-04-04 DIAGNOSIS — I872 Venous insufficiency (chronic) (peripheral): Secondary | ICD-10-CM | POA: Diagnosis not present

## 2015-04-07 DIAGNOSIS — I872 Venous insufficiency (chronic) (peripheral): Secondary | ICD-10-CM | POA: Diagnosis not present

## 2015-04-07 DIAGNOSIS — G629 Polyneuropathy, unspecified: Secondary | ICD-10-CM | POA: Diagnosis not present

## 2015-04-07 DIAGNOSIS — M6281 Muscle weakness (generalized): Secondary | ICD-10-CM | POA: Diagnosis not present

## 2015-04-07 DIAGNOSIS — I69398 Other sequelae of cerebral infarction: Secondary | ICD-10-CM | POA: Diagnosis not present

## 2015-04-07 DIAGNOSIS — M47817 Spondylosis without myelopathy or radiculopathy, lumbosacral region: Secondary | ICD-10-CM | POA: Diagnosis not present

## 2015-04-07 DIAGNOSIS — M170 Bilateral primary osteoarthritis of knee: Secondary | ICD-10-CM | POA: Diagnosis not present

## 2015-04-11 DIAGNOSIS — I69398 Other sequelae of cerebral infarction: Secondary | ICD-10-CM | POA: Diagnosis not present

## 2015-04-11 DIAGNOSIS — M170 Bilateral primary osteoarthritis of knee: Secondary | ICD-10-CM | POA: Diagnosis not present

## 2015-04-11 DIAGNOSIS — M6281 Muscle weakness (generalized): Secondary | ICD-10-CM | POA: Diagnosis not present

## 2015-04-11 DIAGNOSIS — G629 Polyneuropathy, unspecified: Secondary | ICD-10-CM | POA: Diagnosis not present

## 2015-04-11 DIAGNOSIS — M47817 Spondylosis without myelopathy or radiculopathy, lumbosacral region: Secondary | ICD-10-CM | POA: Diagnosis not present

## 2015-04-11 DIAGNOSIS — I872 Venous insufficiency (chronic) (peripheral): Secondary | ICD-10-CM | POA: Diagnosis not present

## 2015-04-16 DIAGNOSIS — I69398 Other sequelae of cerebral infarction: Secondary | ICD-10-CM | POA: Diagnosis not present

## 2015-04-16 DIAGNOSIS — I872 Venous insufficiency (chronic) (peripheral): Secondary | ICD-10-CM | POA: Diagnosis not present

## 2015-04-16 DIAGNOSIS — G629 Polyneuropathy, unspecified: Secondary | ICD-10-CM | POA: Diagnosis not present

## 2015-04-16 DIAGNOSIS — M6281 Muscle weakness (generalized): Secondary | ICD-10-CM | POA: Diagnosis not present

## 2015-04-16 DIAGNOSIS — M47817 Spondylosis without myelopathy or radiculopathy, lumbosacral region: Secondary | ICD-10-CM | POA: Diagnosis not present

## 2015-04-16 DIAGNOSIS — M170 Bilateral primary osteoarthritis of knee: Secondary | ICD-10-CM | POA: Diagnosis not present

## 2015-04-18 DIAGNOSIS — M47817 Spondylosis without myelopathy or radiculopathy, lumbosacral region: Secondary | ICD-10-CM | POA: Diagnosis not present

## 2015-04-18 DIAGNOSIS — M170 Bilateral primary osteoarthritis of knee: Secondary | ICD-10-CM | POA: Diagnosis not present

## 2015-04-18 DIAGNOSIS — I69398 Other sequelae of cerebral infarction: Secondary | ICD-10-CM | POA: Diagnosis not present

## 2015-04-18 DIAGNOSIS — G629 Polyneuropathy, unspecified: Secondary | ICD-10-CM | POA: Diagnosis not present

## 2015-04-18 DIAGNOSIS — I872 Venous insufficiency (chronic) (peripheral): Secondary | ICD-10-CM | POA: Diagnosis not present

## 2015-04-18 DIAGNOSIS — M6281 Muscle weakness (generalized): Secondary | ICD-10-CM | POA: Diagnosis not present

## 2015-04-21 DIAGNOSIS — M170 Bilateral primary osteoarthritis of knee: Secondary | ICD-10-CM | POA: Diagnosis not present

## 2015-04-21 DIAGNOSIS — M6281 Muscle weakness (generalized): Secondary | ICD-10-CM | POA: Diagnosis not present

## 2015-04-21 DIAGNOSIS — I69398 Other sequelae of cerebral infarction: Secondary | ICD-10-CM | POA: Diagnosis not present

## 2015-04-21 DIAGNOSIS — G629 Polyneuropathy, unspecified: Secondary | ICD-10-CM | POA: Diagnosis not present

## 2015-04-21 DIAGNOSIS — M47817 Spondylosis without myelopathy or radiculopathy, lumbosacral region: Secondary | ICD-10-CM | POA: Diagnosis not present

## 2015-04-21 DIAGNOSIS — I872 Venous insufficiency (chronic) (peripheral): Secondary | ICD-10-CM | POA: Diagnosis not present

## 2015-04-25 ENCOUNTER — Telehealth: Payer: Self-pay | Admitting: Neurology

## 2015-04-25 NOTE — Telephone Encounter (Signed)
If she is having pain, we should try to reduce the intensity of her exercises.  If she is having muscle spasm, we can try low dose flexeril 2.5mg , but if her pain is more joint-related, she will need to ask her PCP.  Please be sure she is staying well-hydrated.  Sarah [name] K. Posey Pronto, DO

## 2015-04-25 NOTE — Telephone Encounter (Signed)
I spoke with patient and she would like to try the flexeril.  Rx called in for 2.5 mg qhs prn #15 with 3 refills.

## 2015-04-25 NOTE — Telephone Encounter (Signed)
Patient is still doing PT and is in pain the day after.  Is there anything else she can take.  She has tried ibuprofen and it does not work.

## 2015-04-25 NOTE — Telephone Encounter (Signed)
Pt needs to talk to someone about neuropathy [phone]

## 2015-04-26 DIAGNOSIS — I69398 Other sequelae of cerebral infarction: Secondary | ICD-10-CM | POA: Diagnosis not present

## 2015-04-26 DIAGNOSIS — G629 Polyneuropathy, unspecified: Secondary | ICD-10-CM | POA: Diagnosis not present

## 2015-04-26 DIAGNOSIS — M6281 Muscle weakness (generalized): Secondary | ICD-10-CM | POA: Diagnosis not present

## 2015-04-26 DIAGNOSIS — I872 Venous insufficiency (chronic) (peripheral): Secondary | ICD-10-CM | POA: Diagnosis not present

## 2015-04-26 DIAGNOSIS — M47817 Spondylosis without myelopathy or radiculopathy, lumbosacral region: Secondary | ICD-10-CM | POA: Diagnosis not present

## 2015-04-26 DIAGNOSIS — M170 Bilateral primary osteoarthritis of knee: Secondary | ICD-10-CM | POA: Diagnosis not present

## 2015-04-28 DIAGNOSIS — M47817 Spondylosis without myelopathy or radiculopathy, lumbosacral region: Secondary | ICD-10-CM | POA: Diagnosis not present

## 2015-04-28 DIAGNOSIS — I69398 Other sequelae of cerebral infarction: Secondary | ICD-10-CM | POA: Diagnosis not present

## 2015-04-28 DIAGNOSIS — M170 Bilateral primary osteoarthritis of knee: Secondary | ICD-10-CM | POA: Diagnosis not present

## 2015-04-28 DIAGNOSIS — M6281 Muscle weakness (generalized): Secondary | ICD-10-CM | POA: Diagnosis not present

## 2015-04-28 DIAGNOSIS — G629 Polyneuropathy, unspecified: Secondary | ICD-10-CM | POA: Diagnosis not present

## 2015-04-28 DIAGNOSIS — I872 Venous insufficiency (chronic) (peripheral): Secondary | ICD-10-CM | POA: Diagnosis not present

## 2015-05-02 ENCOUNTER — Other Ambulatory Visit: Payer: Self-pay | Admitting: Internal Medicine

## 2015-05-02 DIAGNOSIS — I69320 Aphasia following cerebral infarction: Secondary | ICD-10-CM | POA: Diagnosis not present

## 2015-05-02 DIAGNOSIS — I10 Essential (primary) hypertension: Secondary | ICD-10-CM | POA: Diagnosis not present

## 2015-05-02 DIAGNOSIS — I69354 Hemiplegia and hemiparesis following cerebral infarction affecting left non-dominant side: Secondary | ICD-10-CM | POA: Diagnosis not present

## 2015-05-02 DIAGNOSIS — I25119 Atherosclerotic heart disease of native coronary artery with unspecified angina pectoris: Secondary | ICD-10-CM | POA: Diagnosis not present

## 2015-05-02 DIAGNOSIS — M47817 Spondylosis without myelopathy or radiculopathy, lumbosacral region: Secondary | ICD-10-CM | POA: Diagnosis not present

## 2015-05-02 DIAGNOSIS — F329 Major depressive disorder, single episode, unspecified: Secondary | ICD-10-CM | POA: Diagnosis not present

## 2015-05-02 DIAGNOSIS — Z602 Problems related to living alone: Secondary | ICD-10-CM | POA: Diagnosis not present

## 2015-05-02 DIAGNOSIS — M170 Bilateral primary osteoarthritis of knee: Secondary | ICD-10-CM | POA: Diagnosis not present

## 2015-05-02 DIAGNOSIS — Z9181 History of falling: Secondary | ICD-10-CM | POA: Diagnosis not present

## 2015-05-02 DIAGNOSIS — G629 Polyneuropathy, unspecified: Secondary | ICD-10-CM | POA: Diagnosis not present

## 2015-05-02 DIAGNOSIS — F419 Anxiety disorder, unspecified: Secondary | ICD-10-CM | POA: Diagnosis not present

## 2015-05-02 DIAGNOSIS — M6281 Muscle weakness (generalized): Secondary | ICD-10-CM | POA: Diagnosis not present

## 2015-05-02 DIAGNOSIS — I872 Venous insufficiency (chronic) (peripheral): Secondary | ICD-10-CM | POA: Diagnosis not present

## 2015-05-02 NOTE — Telephone Encounter (Signed)
Faxed to pharmacy

## 2015-05-05 ENCOUNTER — Ambulatory Visit (INDEPENDENT_AMBULATORY_CARE_PROVIDER_SITE_OTHER): Payer: Medicare Other | Admitting: Neurology

## 2015-05-05 ENCOUNTER — Encounter: Payer: Self-pay | Admitting: Neurology

## 2015-05-05 VITALS — BP 110/80 | HR 92 | Wt 169.6 lb

## 2015-05-05 DIAGNOSIS — R51 Headache: Secondary | ICD-10-CM | POA: Diagnosis not present

## 2015-05-05 DIAGNOSIS — G608 Other hereditary and idiopathic neuropathies: Secondary | ICD-10-CM | POA: Diagnosis not present

## 2015-05-05 DIAGNOSIS — R2681 Unsteadiness on feet: Secondary | ICD-10-CM | POA: Diagnosis not present

## 2015-05-05 DIAGNOSIS — E538 Deficiency of other specified B group vitamins: Secondary | ICD-10-CM

## 2015-05-05 DIAGNOSIS — G4486 Cervicogenic headache: Secondary | ICD-10-CM

## 2015-05-05 MED ORDER — CYANOCOBALAMIN 1000 MCG/ML IJ SOLN
1000.0000 ug | Freq: Once | INTRAMUSCULAR | Status: AC
  Administered 2015-05-05: 1000 ug via INTRAMUSCULAR

## 2015-05-05 NOTE — Progress Notes (Signed)
Patient requested for me to give her B12 shot.

## 2015-05-05 NOTE — Patient Instructions (Signed)
Return to clinic in 4 months.

## 2015-05-05 NOTE — Progress Notes (Signed)
[facility]  Follow-up Visit   Date: 05/05/2015   Sarah [name] MRN: [id number] DOB: [birth year]   Interim History: Sarah [name] is a 82 y.o. right-handed Caucasian female with history of HTN, HPL, depression, esophageal strictures, and GERD returning to the clinic for follow-up of multifactorial gait instability, headaches, and stroke.  The patient was accompanied to the clinic by her caregiver, [name].  History of present illness: Leg weakness started slowly around May 2014 when she noticed difficulty with walking and tripping on things. She would stumble frequently, no falls.  She has associated bilateral achy knee pain and chronic back pain but had worsening pain in August 2014. Pain is achy located at the low back and does not radiate into her buttocks or legs. Forward flexion makes her pain worse and it is improved by laying down.   EMG was performed and shows moderate peripheral neuropathy of the legs and L3-L4 old radiculopathy.   Follow-up 10/09/2013:  She had an unwittnessed fall on 6/17 while transferring a few things to her dining table.  She hit the back of her head and bruised her right wrist.  Caregiver says that she is more forgetful than usual, especially with conversation.  Since her fall, she has had intermittent headaches, described as achy, occuring daily and last all day. It usually responds to tylenol.    UPDATE 09/02/2014:  Patient had an interval hospitalization for acute stroke manifested with slurred speech which has since resolved.  She called for same-day appointment because of severe bifrontal headache since last week.  She went to the ED 6/2 where tramadol was given and provided relief. She is getting home PT and feels that she may have over exerted herself lately which could have triggered her migraine.  There is significant photophobia and phonophobia.  No nasuea or vomiting.    UPDATE 09/20/2014:  She is so relieved that her  headaches improved with prednisone.  She is doing well and has not had any further headaches, slurred speech, or falls.  She is complaining of new chest wall pain, especially when she reaches for objects.  There is mild tenderness if she pushes on her sternum.  UPDATE 12/27/2014:   She started having headaches several weeks ago which are bifrontal and throbbing. She went to urgent care on 9/26 where she was givne prednisone and amoxicillin for possible sinusitis. She tried excedrin migraine did not provide any relief.  No worsening of headache when laying flat, with sneezing/coughing, and she denies any vision changes.  Headache is similar to what she has experienced in the past.   UPDATE 05/05/2015:  She is complaining of joint pain, especially in the knees and leg swelling.  She is requesting to have fluid drawn off, and I explained that this is not my area of expertise. Her headaches are much improved since she started taking ginger supplements.  She denies painful paresthesias of the feet, but does feel like her balance is becoming worse.  Fortunately, there have been no interval falls.  She continues to use a rollator.  She is very determined to loose 5lb so she can fit into a dress for her grandson's wedding in the summer and is inquiring about weight loss strategies.    Medications:  Current Outpatient Prescriptions on File Prior to Visit  Medication Sig Dispense Refill  . acetaminophen (TYLENOL) 500 MG tablet Take 1 tablet (500 mg total) by mouth every 8 (eight) hours as needed (for pain). 180 tablet  2  . ALPRAZolam (XANAX) 1 MG tablet TAKE 1 TABLET TWICE DAILY. 60 tablet 2  . Cholecalciferol (VITAMIN D PO) Take 1 tablet by mouth daily.     . clobetasol ointment (TEMOVATE) [id number] % Apply 1 application topically 2 (two) times daily.     . clopidogrel (PLAVIX) 75 MG tablet TAKE 1 TABLET ONCE DAILY. 30 tablet 6  . cyanocobalamin (,VITAMIN B-12,) 1000 MCG/ML injection INJECT 1 ML INTO THE MUSCLE EVERY  30 DAYS. 10 mL 5  . diclofenac sodium (VOLTAREN) 1 % GEL Apply  To feet 3 times daily prn for pain 1 Tube 5  . furosemide (LASIX) 20 MG tablet Take 20 mg by mouth daily as needed (edema).    . Glucosamine HCl 750 MG TABS Take 1 tablet by mouth 2 (two) times daily.     Marland Kitchen HALCION 0.25 MG tablet TAKE 1/2 TABLET AT BEDTIME AS NEEDED FOR SLEEP. 45 tablet 0  . Misc Natural Products (TUMERSAID) TABS Take 1 capsule by mouth 2 (two) times daily. 60 tablet 3  . nitroGLYCERIN (NITROSTAT) 0.4 MG SL tablet Place 1 tablet (0.4 mg total) under the tongue every 5 (five) minutes as needed for chest pain. 100 tablet prn  . nortriptyline (PAMELOR) 25 MG capsule TAKE 1 CAPSULE AT BEDTIME. 90 capsule 3  . pantoprazole (PROTONIX) 40 MG tablet Take 40 mg by mouth daily.    Marland Kitchen perphenazine (TRILAFON) 2 MG tablet TAKE ONE TABLET AT BEDTIME. 30 tablet 2  . pravastatin (PRAVACHOL) 20 MG tablet Take 10 mg by mouth daily.    . solifenacin (VESICARE) 10 MG tablet Take 10 mg by mouth daily as needed (to reduce number of bathroom visits during travel).     No current facility-administered medications on file prior to visit.    Allergies:  Allergies  Allergen Reactions  . Celebrex [Celecoxib]     rash  . Lipitor [Atorvastatin Calcium]     Leg weakness,memory loss  . Lovastatin     Leg weakness  . Macrobid [employer] Macro]   . Other     Always pads  . Latex Rash     Review of Systems:  CONSTITUTIONAL: No fevers, chills, night sweats, or weight loss.   EYES: No visual changes or eye pain ENT: No hearing changes.  No history of nose bleeds. RESPIRATORY: No cough, wheezing and shortness of breath.   CARDIOVASCULAR: Negative for chest pain, and palpitations.   GI: Negative for abdominal discomfort, blood in stools or black stools.  No recent change in bowel habits.  GU:  No history of incontinence.   MUSCLOSKELETAL: No history of joint pain or swelling.  No myalgias.  + back pain SKIN: Negative for  lesions, rash, and itching.   HEMATOLOGY/ONCOLOGY: Negative for prolonged bleeding, bruising easily, and swollen nodes.   ENDOCRINE: Negative for cold or heat intolerance, polydipsia or goiter.   PSYCH:  No depression or anxiety symptoms.   NEURO: As Above.   Vital Signs:  BP 110/80 mmHg  Pulse 92  Wt 169 lb 9 oz (76.913 kg)  SpO2 99%  Neurological Exam: MENTAL STATUS including orientation to time, place, person, recent and remote memory is fairly intact.  Moderately blunted affect. No dysarthria. Repetition intact  CRANIAL NERVES:  Pupils round and reactive to light. Extraocular muscles are intact. Face is symmetric. Palate elevates symmetrically and tongue is midline.  MOTOR:  Motor strength is 5/5 in proximal muscles, LLE limited by knee pain. Tone is normal throughout.  SENSORY:  Absent vibration distal to ankles  Data: XR lumbar spine 06/07/2012:  Curvature convex to the left. Worsening of degenerative disc disease at T12-L1, L1-2 and L4-5.   Labs 12/06/2012:  TSH 5.43 Labs 12/15/2012:  Vitamin B12 426, MMA 0.22, ceruloplasmin 29, copper 114 SPEP/UPEP with IFE - no monoclonal gammopathy Labs 01/16/2013:  HbA1c 5.9  EMG 01/15/2013:  Taken together, these findings are consistent with a moderate generalized, large fiber, sensorimotor polyneuropathy affecting the left side. There is a superimposed intraspinal canal lesion (i.e. radiculopathy) affecting the L2-L4 nerve root/segments bilaterally, moderate in degree electrically.  CT brain 10/15/2013: No skull fracture or intracranial hemorrhage. Fluid level within the right maxillary sinus may be related to sinus  disease without secondary findings of orbital injury. If orbital injury were of clinical concern, CT orbits recommended for further  delineation. Prominent small vessel disease type changes without CT evidence of large acute infarct.  Mild global atrophy without hydrocephalus.  MRI/A Brain 07/03/2014: 1. Acute  nonhemorrhagic 21 mm white matter infarct of the posterior right frontal lobe. 2. Extensive atrophy and diffuse white matter disease corresponds to advanced chronic microvascular ischemia. 3. Moderate to high-grade stenosis of the infra posterior right M2 branch. 4. Moderate small vessel disease is evident on the MRA. 5. High-grade stenosis in the proximal left P2 segment.  US carotids 07/04/2014:  1-39% ICA stenosis bilaterally TTE 07/04/2014:  Within normal limits, no thrombus   IMPRESSION/PLAN:   1.  Chronic tension headaches - improved   - Patient has been using ginger supplement which has helped  - Previously tried:  Flexeril (sedation), medrol dose pack  2.  Peripheral neuropathy causing painful paresthesias and gait instability (worsening)  - Continue nortriptyline 25mg  qhs which controls her painful paresthesias  - Unfortunately, gait ataxia is due to the progression of neuropathy.  She is already doing PT and uses a rollator  - I discussed using a wheelchair for long distances, but patient really does not wish to use one at this time   3.  Right posterior frontal ischemic stroke manifesting with slurred speech due to intracranial stenosis (April 2016)  - Etiology: intracranial stenosis R M2 (high grade) and proximal left P2 (high grade)  - Continue plavix 75mg  and pravastatin 20mg  (LDL 77)  4.  Vitamin B12 deficiency  - Administer injection today, patient provided own B12  5. Weight loss  - Weight loss strategies specifically watching her caloric and sugar intake discussed  - She is unable to exercise due to significant arthralgias and imbalance, so I recommended water exercises but she does not have access to a pool  6.  Osteoarthritis, knee pain and swelling  - Follow-up with Dr. Tamala Julian and/or PCP   Return to clinic in 70-months   The duration of this appointment visit was 30 minutes of face-to-face time with the patient.  Greater than 50% of this time was spent in  counseling, explanation of diagnosis, planning of further management, and coordination of care.   Thank you for allowing me to participate in patient's care.  If I can answer any additional questions, I would be pleased to do so.    Sincerely,    Tyronn Golda K. Posey Pronto, DO

## 2015-05-06 ENCOUNTER — Telehealth: Payer: Self-pay | Admitting: Neurology

## 2015-05-06 NOTE — Telephone Encounter (Signed)
I called patient and she said that she has just fallen.  Her neighbors are with her at this time.  Instructed her to go to ER if she needs to.  Patient agreed.  Informed her that I will call her in the morning to check on her.  She asked for me not to call before 10 am.  Please route this back to me.

## 2015-05-06 NOTE — Telephone Encounter (Signed)
PT returned your call/Dawn CB# [phone]

## 2015-05-06 NOTE — Telephone Encounter (Signed)
Noted, please call her tomorrow and see how she is doing.

## 2015-05-07 DIAGNOSIS — M6281 Muscle weakness (generalized): Secondary | ICD-10-CM | POA: Diagnosis not present

## 2015-05-07 DIAGNOSIS — M170 Bilateral primary osteoarthritis of knee: Secondary | ICD-10-CM | POA: Diagnosis not present

## 2015-05-07 DIAGNOSIS — I69354 Hemiplegia and hemiparesis following cerebral infarction affecting left non-dominant side: Secondary | ICD-10-CM | POA: Diagnosis not present

## 2015-05-07 DIAGNOSIS — I872 Venous insufficiency (chronic) (peripheral): Secondary | ICD-10-CM | POA: Diagnosis not present

## 2015-05-07 DIAGNOSIS — M47817 Spondylosis without myelopathy or radiculopathy, lumbosacral region: Secondary | ICD-10-CM | POA: Diagnosis not present

## 2015-05-07 DIAGNOSIS — G629 Polyneuropathy, unspecified: Secondary | ICD-10-CM | POA: Diagnosis not present

## 2015-05-07 NOTE — Telephone Encounter (Signed)
I called patient and spoke with Sarah [name].  Sarah [name] said that patient is ok.  She has a small bruise on her right arm but no bumps or bruises anywhere else.  PT will be coming out today and will check patient out.  Sarah [name] will call back with any further updates.

## 2015-05-08 ENCOUNTER — Other Ambulatory Visit: Payer: Self-pay | Admitting: Internal Medicine

## 2015-05-08 ENCOUNTER — Telehealth: Payer: Self-pay | Admitting: *Deleted

## 2015-05-08 ENCOUNTER — Telehealth: Payer: Self-pay | Admitting: Neurology

## 2015-05-08 NOTE — Telephone Encounter (Signed)
See next note

## 2015-05-08 NOTE — Telephone Encounter (Signed)
Is the shaking constant?  At rest?  In the past, we discussed that she has some mild signs of parkinsonism, we can certainly offer a trial of sinemet.  If she is interested, please start as follows and send Rx for 1 tab TID - you will need to give [name] the schedule for the medication as we titrate it.  Start Carbidopa Levodopa as follows at least 30-min prior to meals:     AM  Afternoon   Evening   Week 1:  1/2 tab  1/2 tab   1/2 tab  Week 2:   1/2 tab  1/2 tab   1 tab  Week 3:  1/2 tab  1 tab   1 tab  Week 4:  1 tab  1 tab   1 tab  *Avoid taking with protein products, such as milk, meat, cheese  *if you develop nausea, take with crackers  Geanie Pacifico K. Posey Pronto, DO

## 2015-05-08 NOTE — Telephone Encounter (Signed)
Patient called back and said that she will check on the price of the medication and call me back to let me know if she would like to start it.

## 2015-05-08 NOTE — Telephone Encounter (Signed)
PT returned your call/Dawn CB# [phone]

## 2015-05-08 NOTE — Telephone Encounter (Signed)
Patient called stating that she is having shaking in her arms when she uses her grab bar.  Anything to do for this?

## 2015-05-08 NOTE — Telephone Encounter (Signed)
Left patient a message to call me if she is interested in starting the sinemet.

## 2015-05-12 ENCOUNTER — Ambulatory Visit (INDEPENDENT_AMBULATORY_CARE_PROVIDER_SITE_OTHER): Payer: Medicare Other | Admitting: Family Medicine

## 2015-05-12 ENCOUNTER — Encounter: Payer: Self-pay | Admitting: Family Medicine

## 2015-05-12 ENCOUNTER — Other Ambulatory Visit (INDEPENDENT_AMBULATORY_CARE_PROVIDER_SITE_OTHER): Payer: Medicare Other

## 2015-05-12 VITALS — BP 112/82 | HR 95 | Wt 162.0 lb

## 2015-05-12 DIAGNOSIS — M47818 Spondylosis without myelopathy or radiculopathy, sacral and sacrococcygeal region: Secondary | ICD-10-CM

## 2015-05-12 DIAGNOSIS — M170 Bilateral primary osteoarthritis of knee: Secondary | ICD-10-CM | POA: Diagnosis not present

## 2015-05-12 DIAGNOSIS — R609 Edema, unspecified: Secondary | ICD-10-CM | POA: Insufficient documentation

## 2015-05-12 DIAGNOSIS — M4698 Unspecified inflammatory spondylopathy, sacral and sacrococcygeal region: Secondary | ICD-10-CM

## 2015-05-12 DIAGNOSIS — R600 Localized edema: Secondary | ICD-10-CM | POA: Insufficient documentation

## 2015-05-12 NOTE — Progress Notes (Signed)
CC: Back pain followup  HPI: Patient is a very pleasant 82 year old female coming in with chronic back pain. Patient was treated for severe sacroiliac joint arthritis. It has been greater than 3 months since patient's last injection. Patient since last exam though did have a hospitalization when patient did have a CVA.  Patient is coming back again with worsening back pain. Does have arthritis of the sacroiliac joint. Has responded fairly well to injections previously. Patient is now 3 months out since last injection. Patient states overall she is been doing relatively well but said having worsening symptoms.  Patient also is complaining of leftknee pain.patient actually states that both knees are hurting her. Has known arthritis in the knees. Significant osteoporosis as well. Patient has been given injections long time ago but has not needed them. Seems to be worsening. Could be compensated for her back. States that the left knee is giving her such severe pain she would like a potential injection. Once to avoid any other chronic medications possible secondary to her other comorbidities. States that she did recently have a fall. This is her first fall. Didn't fall more on her back but had to get on her knees to get up and since then has had pain. No locking or giving out on her.  Previous imaging studies XR lumbar spine 06/07/2012: Findings: There is curvature convex to the left. There is advanced degenerative disc disease at T12-L1, L1-2 and L4-5. The L5-S1 level is transitional. There is anterolisthesis of 3 mm of L4-5.  There is retrolisthesis of 3 mm at T12-L1 and L1-2. These degenerative changes have worsened since 2010. No evidence of fracture.  IMPRESSION:  Curvature convex to the left. Worsening of degenerative disc disease at T12-L1, L1-2 and L4-5.   Past medical, surgical, family and social history reviewed. Medications reviewed all in the electronic medical record.   Review of Systems: No  headache, visual changes, nausea, vomiting, diarrhea, constipation, dizziness, abdominal pain, skin rash, fevers, chills, night sweats, weight loss, swollen lymph nodes, body aches, joint swelling, muscle aches, chest pain, shortness of breath, mood changes.   Objective:    Blood pressure 112/82, pulse 95, weight 162 lb (73.483 kg), SpO2 97 %.   General: No apparent distress alert and oriented x3 mood and affect normal, dressed appropriately. Masked faces worse than previous exam HEENT: Pupils equal, extraocular movements intact Respiratory: Patient's speak in full sentences and does not appear short of breath Cardiovascular: 2+ pitting edema to mid thigh bilaterally Skin: Warm dry intact with no signs of infection or rash on extremities or on axial skeleton. Abdomen: Soft nontender Neuro: Cranial nerves II through XII are intact, neurovascularly intact in all extremities with 2+ DTRs and 2+ pulses. Lymph: No lymphadenopathy of posterior or anterior cervical chain or axillae bilaterally.  Gait walks with a walker and shuffling gait MSK: Non tender with full range of motion and good stability and symmetric strength and tone of shoulders, elbows, wrist, hip, and ankles bilaterally. Moderate loss of range of motion of multiple joints secondary to osteoporosis.  Knee:Right Normal to inspection with no erythema or effusion or obvious bony abnormalities. Medial joint line tenderness as well as some lateral joint line tenderness. ROM full in flexion and extension and lower leg rotation.patient does have some rigidity noted significant for Parkinson's. Ligaments with solid consistent endpoints including ACL, PCL, LCL, MCL. Negative Mcmurray's, Apley's, and Thessalonian tests. Non painful patellar compression. Patellar glide mildcrepitus. Patellar and quadriceps tendons unremarkable. Hamstring and quadriceps strength  is normal.    Shoulder: Right Inspection reveals no abnormalities, atrophy or  asymmetry. Palpation is normal with no tenderness over AC joint or bicipital groove. ROM is full in all planes passively. Rotator cuff strength normal throughout. signs of impingement with positive Neer and Hawkin's tests, but negative empty can sign. Speeds and Yergason's tests normal. No labral pathology noted with negative Obrien's, negative clunk and good stability. Normal scapular function observed. No painful arc and no drop arm sign. No apprehension sign Contralateral shoulder has some mild crepitus and impingement as well but not as severe as the right side. Mild improvement from previous exam  Back Exam:  Inspection:increase kyphosis as well as some scoliosis Motion: Flexion 35 deg, Extension 25 deg, Side Bending to 25 deg bilaterally,  Rotation to 25 deg bilaterally  SLR laying: pain but no radiation  XSLR laying: Negative  Palpable tenderness: Severe tenderness over the right sacroiliac joint  FABER: unable for patient to participate. Sensory change: Gross sensation intact to all lumbar and sacral dermatomes.  Reflexes: 2+ at both patellar tendons, 2+ at achilles tendons, Babinski's downgoing.  Strength at foot  Plantar-flexion: 5/5 Dorsi-flexion: 5/5 Eversion: 5/5 Inversion: 5/5  Leg strength  Quad: 4/5 Hamstring: 4/5 Hip flexor: 4/5 Hip abductors: 3/5  But symmetric to contralateral side.   Procedure: Real-time Ultrasound Guided Injection of left sacroiliac joint Device: GE Logiq E  Ultrasound guided injection is preferred based studies that show increased duration, increased effect, greater accuracy, decreased procedural pain, increased response rate, and decreased cost with ultrasound guided versus blind injection.  Verbal informed consent obtained.  Time-out conducted.  Noted no overlying erythema, induration, or other signs of local infection.  Skin prepped in a sterile fashion.  Local anesthesia: Topical Ethyl chloride.  With sterile technique and under real  time ultrasound guidance:  With a 22-gauge 3-1/2 inch needle patient was injected with 1 mL of 0.5% Marcaine and 1 mL of Kenalog 40 mg/dL into the joint specified. Completed without difficulty  Pain immediately resolved suggesting accurate placement of the medication.  Advised to call if fevers/chills, erythema, induration, drainage, or persistent bleeding.  Images permanently stored and available for review in the ultrasound unit.  Impression: Technically successful ultrasound guided injection.  After informed written and verbal consent, patient was seated on exam table. Right knee was prepped with alcohol swab and utilizing anterolateral approach, patient's right knee space was injected with 4:1  marcaine 0.5%: Kenalog 40mg /dL. Patient tolerated the procedure well without immediate complications.    Impression and Recommendations:     This case required medical decision making of moderate complexity.

## 2015-05-12 NOTE — Assessment & Plan Note (Signed)
Patient given another injection again today. Tolerated the procedure well. We discussed icing regimen and home exercises. We discussed which activities to do an which ones to avoid. Patient given another series of topical anti-inflammatory's. Patient will come back and see me again in 10 and 12 weeks.

## 2015-05-12 NOTE — Assessment & Plan Note (Signed)
Encouraged patient to take Lasix daily for the next week. Patient was taken in 3 days and is artery loss 6 pounds.

## 2015-05-12 NOTE — Assessment & Plan Note (Signed)
Patient given injection today tolerated the procedure well. We discussed possible bracing which patient declined. Patient I think also is having some knee pain secondary to the lower joint swelling. Patient will take her Lasix on a regular basis. Patient and will follow-up in 2-3 weeks. If the right side continues to give her trouble we'll consider injection. Patient in the long run could be also a candidate for viscous supplementation.

## 2015-05-12 NOTE — Patient Instructions (Addendum)
Always good to see you Stay active and keep moving.  Ice can always help up to 10 minutes  pennsaid pinkie amount topically 2 times daily as needed.  Gave you more samples. Consider adding tylenol 500mg  3 times a day  If you want we can always consider aquatic therapy if we need something more.  Continue the lasix daily for another 7-10 days to get the fluid off.  Make another appointment in 2 weeks if the right knee is not better and we will inject.  See me again in 10-12 weeks

## 2015-05-14 DIAGNOSIS — I69354 Hemiplegia and hemiparesis following cerebral infarction affecting left non-dominant side: Secondary | ICD-10-CM | POA: Diagnosis not present

## 2015-05-14 DIAGNOSIS — M6281 Muscle weakness (generalized): Secondary | ICD-10-CM | POA: Diagnosis not present

## 2015-05-14 DIAGNOSIS — M47817 Spondylosis without myelopathy or radiculopathy, lumbosacral region: Secondary | ICD-10-CM | POA: Diagnosis not present

## 2015-05-14 DIAGNOSIS — M170 Bilateral primary osteoarthritis of knee: Secondary | ICD-10-CM | POA: Diagnosis not present

## 2015-05-14 DIAGNOSIS — G629 Polyneuropathy, unspecified: Secondary | ICD-10-CM | POA: Diagnosis not present

## 2015-05-14 DIAGNOSIS — I872 Venous insufficiency (chronic) (peripheral): Secondary | ICD-10-CM | POA: Diagnosis not present

## 2015-05-15 ENCOUNTER — Telehealth: Payer: Self-pay

## 2015-05-15 DIAGNOSIS — I69354 Hemiplegia and hemiparesis following cerebral infarction affecting left non-dominant side: Secondary | ICD-10-CM | POA: Diagnosis not present

## 2015-05-15 NOTE — Telephone Encounter (Signed)
Home Health Cert/Plan of Care received (05/02/2015 - 06/30/2015) and placed on MD's desk for signature

## 2015-05-15 NOTE — Telephone Encounter (Signed)
Paperwork singed, faxed, copy sent to scan

## 2015-05-16 ENCOUNTER — Telehealth: Payer: Self-pay | Admitting: Internal Medicine

## 2015-05-16 DIAGNOSIS — G629 Polyneuropathy, unspecified: Secondary | ICD-10-CM | POA: Diagnosis not present

## 2015-05-16 DIAGNOSIS — M6281 Muscle weakness (generalized): Secondary | ICD-10-CM | POA: Diagnosis not present

## 2015-05-16 DIAGNOSIS — M170 Bilateral primary osteoarthritis of knee: Secondary | ICD-10-CM | POA: Diagnosis not present

## 2015-05-16 DIAGNOSIS — I872 Venous insufficiency (chronic) (peripheral): Secondary | ICD-10-CM | POA: Diagnosis not present

## 2015-05-16 DIAGNOSIS — I69354 Hemiplegia and hemiparesis following cerebral infarction affecting left non-dominant side: Secondary | ICD-10-CM | POA: Diagnosis not present

## 2015-05-16 DIAGNOSIS — M47817 Spondylosis without myelopathy or radiculopathy, lumbosacral region: Secondary | ICD-10-CM | POA: Diagnosis not present

## 2015-05-16 NOTE — Telephone Encounter (Signed)
Pt called in and said that she needs to talk to the dr about her meds.  She would like a nurse to call her

## 2015-05-19 ENCOUNTER — Other Ambulatory Visit: Payer: Self-pay | Admitting: Geriatric Medicine

## 2015-05-19 DIAGNOSIS — M170 Bilateral primary osteoarthritis of knee: Secondary | ICD-10-CM | POA: Diagnosis not present

## 2015-05-19 DIAGNOSIS — M6281 Muscle weakness (generalized): Secondary | ICD-10-CM | POA: Diagnosis not present

## 2015-05-19 DIAGNOSIS — I872 Venous insufficiency (chronic) (peripheral): Secondary | ICD-10-CM | POA: Diagnosis not present

## 2015-05-19 DIAGNOSIS — G629 Polyneuropathy, unspecified: Secondary | ICD-10-CM | POA: Diagnosis not present

## 2015-05-19 DIAGNOSIS — M47817 Spondylosis without myelopathy or radiculopathy, lumbosacral region: Secondary | ICD-10-CM | POA: Diagnosis not present

## 2015-05-19 DIAGNOSIS — I69354 Hemiplegia and hemiparesis following cerebral infarction affecting left non-dominant side: Secondary | ICD-10-CM | POA: Diagnosis not present

## 2015-05-19 MED ORDER — DICLOFENAC SODIUM 1 % TD GEL
TRANSDERMAL | Status: DC
Start: 2015-05-19 — End: 2016-05-13

## 2015-05-19 NOTE — Telephone Encounter (Signed)
Patient wanted a refill of her diclofenac gel. Sent to pharmacy.

## 2015-05-23 ENCOUNTER — Other Ambulatory Visit: Payer: Self-pay | Admitting: Internal Medicine

## 2015-05-23 DIAGNOSIS — M6281 Muscle weakness (generalized): Secondary | ICD-10-CM | POA: Diagnosis not present

## 2015-05-23 DIAGNOSIS — M47817 Spondylosis without myelopathy or radiculopathy, lumbosacral region: Secondary | ICD-10-CM | POA: Diagnosis not present

## 2015-05-23 DIAGNOSIS — I872 Venous insufficiency (chronic) (peripheral): Secondary | ICD-10-CM | POA: Diagnosis not present

## 2015-05-23 DIAGNOSIS — G629 Polyneuropathy, unspecified: Secondary | ICD-10-CM | POA: Diagnosis not present

## 2015-05-23 DIAGNOSIS — I69354 Hemiplegia and hemiparesis following cerebral infarction affecting left non-dominant side: Secondary | ICD-10-CM | POA: Diagnosis not present

## 2015-05-23 DIAGNOSIS — M170 Bilateral primary osteoarthritis of knee: Secondary | ICD-10-CM | POA: Diagnosis not present

## 2015-05-26 ENCOUNTER — Other Ambulatory Visit: Payer: Self-pay

## 2015-05-26 DIAGNOSIS — I69354 Hemiplegia and hemiparesis following cerebral infarction affecting left non-dominant side: Secondary | ICD-10-CM | POA: Diagnosis not present

## 2015-05-26 DIAGNOSIS — M170 Bilateral primary osteoarthritis of knee: Secondary | ICD-10-CM | POA: Diagnosis not present

## 2015-05-26 DIAGNOSIS — I872 Venous insufficiency (chronic) (peripheral): Secondary | ICD-10-CM | POA: Diagnosis not present

## 2015-05-26 DIAGNOSIS — G629 Polyneuropathy, unspecified: Secondary | ICD-10-CM | POA: Diagnosis not present

## 2015-05-26 DIAGNOSIS — M47817 Spondylosis without myelopathy or radiculopathy, lumbosacral region: Secondary | ICD-10-CM | POA: Diagnosis not present

## 2015-05-26 DIAGNOSIS — M6281 Muscle weakness (generalized): Secondary | ICD-10-CM | POA: Diagnosis not present

## 2015-05-26 MED ORDER — NITROGLYCERIN 0.4 MG SL SUBL: 0.4000 mg | SUBLINGUAL_TABLET | SUBLINGUAL | Status: DC | PRN

## 2015-05-26 NOTE — Telephone Encounter (Signed)
Darlin Coco, MD at 02/12/2015 2:09 PM  2. Chest pain responsive to sublingual nitroglycerin. No previous objective evidence of ischemic heart disease.

## 2015-05-29 ENCOUNTER — Ambulatory Visit: Payer: Medicare Other | Admitting: Family Medicine

## 2015-05-30 DIAGNOSIS — M6281 Muscle weakness (generalized): Secondary | ICD-10-CM | POA: Diagnosis not present

## 2015-05-30 DIAGNOSIS — I69354 Hemiplegia and hemiparesis following cerebral infarction affecting left non-dominant side: Secondary | ICD-10-CM | POA: Diagnosis not present

## 2015-05-30 DIAGNOSIS — M170 Bilateral primary osteoarthritis of knee: Secondary | ICD-10-CM | POA: Diagnosis not present

## 2015-05-30 DIAGNOSIS — M47817 Spondylosis without myelopathy or radiculopathy, lumbosacral region: Secondary | ICD-10-CM | POA: Diagnosis not present

## 2015-05-30 DIAGNOSIS — I872 Venous insufficiency (chronic) (peripheral): Secondary | ICD-10-CM | POA: Diagnosis not present

## 2015-05-30 DIAGNOSIS — G629 Polyneuropathy, unspecified: Secondary | ICD-10-CM | POA: Diagnosis not present

## 2015-06-02 ENCOUNTER — Encounter: Payer: Self-pay | Admitting: Podiatry

## 2015-06-02 ENCOUNTER — Ambulatory Visit (INDEPENDENT_AMBULATORY_CARE_PROVIDER_SITE_OTHER): Payer: Medicare Other | Admitting: Podiatry

## 2015-06-02 VITALS — BP 121/68 | HR 85 | Resp 17

## 2015-06-02 DIAGNOSIS — L84 Corns and callosities: Secondary | ICD-10-CM

## 2015-06-02 DIAGNOSIS — L600 Ingrowing nail: Secondary | ICD-10-CM

## 2015-06-04 ENCOUNTER — Other Ambulatory Visit: Payer: Self-pay | Admitting: Internal Medicine

## 2015-06-04 DIAGNOSIS — G629 Polyneuropathy, unspecified: Secondary | ICD-10-CM | POA: Diagnosis not present

## 2015-06-04 DIAGNOSIS — I872 Venous insufficiency (chronic) (peripheral): Secondary | ICD-10-CM | POA: Diagnosis not present

## 2015-06-04 DIAGNOSIS — M6281 Muscle weakness (generalized): Secondary | ICD-10-CM | POA: Diagnosis not present

## 2015-06-04 DIAGNOSIS — M47817 Spondylosis without myelopathy or radiculopathy, lumbosacral region: Secondary | ICD-10-CM | POA: Diagnosis not present

## 2015-06-04 DIAGNOSIS — M170 Bilateral primary osteoarthritis of knee: Secondary | ICD-10-CM | POA: Diagnosis not present

## 2015-06-04 DIAGNOSIS — I69354 Hemiplegia and hemiparesis following cerebral infarction affecting left non-dominant side: Secondary | ICD-10-CM | POA: Diagnosis not present

## 2015-06-04 NOTE — Progress Notes (Signed)
Subjective:     Patient ID: Sarah [name], female   DOB: 10/18/1926, 82 y.o.   MRN: [id number]  HPI patient presents with damaged left hallux nail with looseness of the nail and keratotic lesion right big toe   Review of Systems     Objective:   Physical Exam Neurovascular status unchanged from previous visit with damaged left hallux nail that is loose on the lateral side and hanging on the central and medial portion of the proximal nail bed. Keratotic lesion hallux right    Assessment:     Damaged nailbed left hallux with trauma and keratotic lesion right    Plan:     H&P conditions reviewed and recommended nail removal left with infiltrated 60 mg I can Marcaine mixture sterile conditions remove the hallux nail and applied sterile dressing. For the right debrided lesion and reappoint to recheck

## 2015-06-05 NOTE — Telephone Encounter (Signed)
Sent to pharmacy 

## 2015-06-06 DIAGNOSIS — M6281 Muscle weakness (generalized): Secondary | ICD-10-CM | POA: Diagnosis not present

## 2015-06-06 DIAGNOSIS — I872 Venous insufficiency (chronic) (peripheral): Secondary | ICD-10-CM | POA: Diagnosis not present

## 2015-06-06 DIAGNOSIS — M47817 Spondylosis without myelopathy or radiculopathy, lumbosacral region: Secondary | ICD-10-CM | POA: Diagnosis not present

## 2015-06-06 DIAGNOSIS — I69354 Hemiplegia and hemiparesis following cerebral infarction affecting left non-dominant side: Secondary | ICD-10-CM | POA: Diagnosis not present

## 2015-06-06 DIAGNOSIS — G629 Polyneuropathy, unspecified: Secondary | ICD-10-CM | POA: Diagnosis not present

## 2015-06-06 DIAGNOSIS — M170 Bilateral primary osteoarthritis of knee: Secondary | ICD-10-CM | POA: Diagnosis not present

## 2015-06-09 ENCOUNTER — Encounter: Payer: Self-pay | Admitting: Family Medicine

## 2015-06-09 ENCOUNTER — Ambulatory Visit (INDEPENDENT_AMBULATORY_CARE_PROVIDER_SITE_OTHER): Payer: Medicare Other | Admitting: Family Medicine

## 2015-06-09 VITALS — BP 110/72 | HR 89

## 2015-06-09 DIAGNOSIS — M170 Bilateral primary osteoarthritis of knee: Secondary | ICD-10-CM | POA: Diagnosis not present

## 2015-06-09 NOTE — Progress Notes (Signed)
Pre visit review using our clinic review tool, if applicable. No additional management support is needed unless otherwise documented below in the visit note. 

## 2015-06-09 NOTE — Progress Notes (Signed)
CC: Back pain followup  HPI: Patient is a very pleasant 82 year old female coming in with chronic back pain. Was seen one month ago and given a sacroiliac injection. Doing fairly well. We'll wait 2 more months toe she has another injection.  Patient also is complaining of leftknee pain.patient actually states that both knees are hurting her. Has known arthritis in the knees. Significant osteoporosis as well. Patient was seen previously and was given an injection of the right knee. States that this did help. Having worsening symptoms in the left knee. Dull throbbing aching sensation. Making it difficult for ambulation.   Previous imaging studies XR lumbar spine 06/07/2012: Findings: There is curvature convex to the left. There is advanced degenerative disc disease at T12-L1, L1-2 and L4-5. The L5-S1 level is transitional. There is anterolisthesis of 3 mm of L4-5.  There is retrolisthesis of 3 mm at T12-L1 and L1-2. These degenerative changes have worsened since 2010. No evidence of fracture.  IMPRESSION:  Curvature convex to the left. Worsening of degenerative disc disease at T12-L1, L1-2 and L4-5.   Past medical, surgical, family and social history reviewed. Medications reviewed all in the electronic medical record.   Review of Systems: No headache, visual changes, nausea, vomiting, diarrhea, constipation, dizziness, abdominal pain, skin rash, fevers, chills, night sweats, weight loss, swollen lymph nodes, body aches, joint swelling, muscle aches, chest pain, shortness of breath, mood changes.   Objective:    Blood pressure 110/72, pulse 89, SpO2 97 %.   General: No apparent distress alert and oriented x3 mood and affect normal, dressed appropriately. Masked faces worse than previous exam HEENT: Pupils equal, extraocular movements intact Respiratory: Patient's speak in full sentences and does not appear short of breath Cardiovascular: 2+ pitting edema to mid thigh bilaterally Skin: Warm dry  intact with no signs of infection or rash on extremities or on axial skeleton. Abdomen: Soft nontender Neuro: Cranial nerves II through XII are intact, neurovascularly intact in all extremities with 2+ DTRs and 2+ pulses. Lymph: No lymphadenopathy of posterior or anterior cervical chain or axillae bilaterally.  Gait walks with a walker and shuffling gait MSK: Non tender with full range of motion and good stability and symmetric strength and tone of shoulders, elbows, wrist, hip, and ankles bilaterally. Moderate loss of range of motion of multiple joints secondary to osteoporosis.  Knee:left  Normal to inspection with no erythema or effusion or obvious bony abnormalities. Medial joint line tenderness as well as some lateral joint line tenderness. ROM full in flexion and extension and lower leg rotation.patient does have some rigidity noted significant for Parkinson's. Ligaments with solid consistent endpoints including ACL, PCL, LCL, MCL. Negative Mcmurray's, Apley's, and Thessalonian tests. Non painful patellar compression. Patellar glide mildcrepitus. Patellar and quadriceps tendons unremarkable. Hamstring and quadriceps strength is normal.  Contralateral knee mild tenderness to palpation.  Back Exam:  Inspection:increase kyphosis as well as some scoliosis Motion: Flexion 35 deg, Extension 25 deg, Side Bending to 25 deg bilaterally,  Rotation to 25 deg bilaterally  SLR laying: pain but no radiation  XSLR laying: Negative  Palpable tenderness: Less tender over the sacroiliac joint FABER: unable for patient to participate. Sensory change: Gross sensation intact to all lumbar and sacral dermatomes.  Reflexes: 2+ at both patellar tendons, 2+ at achilles tendons, Babinski's downgoing.  Strength at foot  Plantar-flexion: 5/5 Dorsi-flexion: 5/5 Eversion: 5/5 Inversion: 5/5  Leg strength  Quad: 4/5 Hamstring: 4/5 Hip flexor: 4/5 Hip abductors: 3/5  But symmetric to contralateral side.  After informed written and verbal consent, patient was seated on exam table. Left knee was prepped with alcohol swab and utilizing anterolateral approach, patient's right knee space was injected with 4:1  marcaine 0.5%: Kenalog 40mg /dL. Patient tolerated the procedure well without immediate complications.    Impression and Recommendations:     This case required medical decision making of moderate complexity.

## 2015-06-09 NOTE — Patient Instructions (Signed)
Good to see you  Ice is your friend Every 3 months.  monovisc is other option.

## 2015-06-18 DIAGNOSIS — I872 Venous insufficiency (chronic) (peripheral): Secondary | ICD-10-CM | POA: Diagnosis not present

## 2015-06-18 DIAGNOSIS — G629 Polyneuropathy, unspecified: Secondary | ICD-10-CM | POA: Diagnosis not present

## 2015-06-18 DIAGNOSIS — M47817 Spondylosis without myelopathy or radiculopathy, lumbosacral region: Secondary | ICD-10-CM | POA: Diagnosis not present

## 2015-06-18 DIAGNOSIS — M170 Bilateral primary osteoarthritis of knee: Secondary | ICD-10-CM | POA: Diagnosis not present

## 2015-06-18 DIAGNOSIS — M6281 Muscle weakness (generalized): Secondary | ICD-10-CM | POA: Diagnosis not present

## 2015-06-18 DIAGNOSIS — I69354 Hemiplegia and hemiparesis following cerebral infarction affecting left non-dominant side: Secondary | ICD-10-CM | POA: Diagnosis not present

## 2015-06-20 DIAGNOSIS — M47817 Spondylosis without myelopathy or radiculopathy, lumbosacral region: Secondary | ICD-10-CM | POA: Diagnosis not present

## 2015-06-20 DIAGNOSIS — G629 Polyneuropathy, unspecified: Secondary | ICD-10-CM | POA: Diagnosis not present

## 2015-06-20 DIAGNOSIS — M170 Bilateral primary osteoarthritis of knee: Secondary | ICD-10-CM | POA: Diagnosis not present

## 2015-06-20 DIAGNOSIS — I872 Venous insufficiency (chronic) (peripheral): Secondary | ICD-10-CM | POA: Diagnosis not present

## 2015-06-20 DIAGNOSIS — M6281 Muscle weakness (generalized): Secondary | ICD-10-CM | POA: Diagnosis not present

## 2015-06-20 DIAGNOSIS — I69354 Hemiplegia and hemiparesis following cerebral infarction affecting left non-dominant side: Secondary | ICD-10-CM | POA: Diagnosis not present

## 2015-06-22 ENCOUNTER — Other Ambulatory Visit: Payer: Self-pay | Admitting: Internal Medicine

## 2015-06-23 NOTE — Telephone Encounter (Signed)
Faxed to pharmacy

## 2015-06-26 DIAGNOSIS — I872 Venous insufficiency (chronic) (peripheral): Secondary | ICD-10-CM | POA: Diagnosis not present

## 2015-06-26 DIAGNOSIS — G629 Polyneuropathy, unspecified: Secondary | ICD-10-CM | POA: Diagnosis not present

## 2015-06-26 DIAGNOSIS — M170 Bilateral primary osteoarthritis of knee: Secondary | ICD-10-CM | POA: Diagnosis not present

## 2015-06-26 DIAGNOSIS — M47817 Spondylosis without myelopathy or radiculopathy, lumbosacral region: Secondary | ICD-10-CM | POA: Diagnosis not present

## 2015-06-26 DIAGNOSIS — I69354 Hemiplegia and hemiparesis following cerebral infarction affecting left non-dominant side: Secondary | ICD-10-CM | POA: Diagnosis not present

## 2015-06-26 DIAGNOSIS — M6281 Muscle weakness (generalized): Secondary | ICD-10-CM | POA: Diagnosis not present

## 2015-06-27 ENCOUNTER — Encounter: Payer: Self-pay | Admitting: Internal Medicine

## 2015-06-27 ENCOUNTER — Ambulatory Visit (INDEPENDENT_AMBULATORY_CARE_PROVIDER_SITE_OTHER): Payer: Medicare Other | Admitting: Internal Medicine

## 2015-06-27 VITALS — BP 138/76 | HR 91 | Temp 98.1°F | Resp 18 | Wt 163.0 lb

## 2015-06-27 DIAGNOSIS — R51 Headache: Secondary | ICD-10-CM | POA: Diagnosis not present

## 2015-06-27 DIAGNOSIS — E538 Deficiency of other specified B group vitamins: Secondary | ICD-10-CM

## 2015-06-27 DIAGNOSIS — F32A Depression, unspecified: Secondary | ICD-10-CM

## 2015-06-27 DIAGNOSIS — R519 Headache, unspecified: Secondary | ICD-10-CM

## 2015-06-27 DIAGNOSIS — F329 Major depressive disorder, single episode, unspecified: Secondary | ICD-10-CM | POA: Diagnosis not present

## 2015-06-27 MED ORDER — CYANOCOBALAMIN 1000 MCG/ML IJ SOLN
1000.0000 ug | INTRAMUSCULAR | Status: DC
  Administered 2015-06-27: 1000 ug via INTRAMUSCULAR

## 2015-06-27 NOTE — Progress Notes (Signed)
Subjective:    Patient ID: Sarah [name], female    DOB: November 21, 1926, 82 y.o.   MRN: [id number]  HPI She is here for an acute visit for depression and headaches.  She is here with her caregiver.    She has chronic tension headaches and follows with neurology.  She started having headaches in the fall that were frontal and throbbing.  Per neuro's last note her headaches had improved with ginger supplements.  She is no longer taking the ginger.  She does have frontal headaches.  Her caregiver has been giving her claritin because she thinks she has some allergies.  She has tiredness, no energy, sneezing, and  rhinorrhea.  She is taking flonase daily in addition to the claritin and it seems to be helping. She takes tylenol or excedrin for headaches.   Peripheral neruopathy:  Taking nortriptyline 25 mg qhs.  Her gait ataxia is related to her neruopathy.  She has done PT in the past and uses a walker.     She feels depressed and her family feels she is depressed. She is wondering if there is something she can take for depression.    She does feel tired throughout the day and has low energy.    She was told she had parkinson's disease and thinks she is ready to start medication - she wondered if I could start that.   Medications and allergies reviewed with patient and updated if appropriate.  Patient Active Problem List   Diagnosis Date Noted  . Peripheral edema 05/12/2015  . DOE (dyspnea on exertion) 02/12/2015  . Neck pain 01/10/2015  . Routine general medical examination at a health care facility 09/17/2014  . Hyperglycemia 07/09/2014  . Acute ischemic right MCA stroke ([place]) 07/03/2014  . Degenerative arthritis of knee, bilateral 03/01/2014  . Fall 10/05/2013  . Hyponatremia 10/05/2013  . Memory deficits 10/05/2013  . Arthritis of sacroiliac joint ([place]) 09/11/2013  . Lumbosacral spondylosis without myelopathy 02/26/2013  . Abnormality of gait 12/15/2012  . Right shoulder pain   .  Vitamin B12 deficiency 04/13/2012  . Hypercholesterolemia 11/05/2011  . Osteoporosis, postmenopausal 06/02/2011  . Mixed incontinence urge and stress 06/02/2011  . Anxiety   . Edema 04/06/2011  . Tachycardia 04/06/2011  . Osteoarthritis   . Chest pain     Current Outpatient Prescriptions on File Prior to Visit  Medication Sig Dispense Refill  . acetaminophen (TYLENOL) 500 MG tablet Take 1 tablet (500 mg total) by mouth every 8 (eight) hours as needed (for pain). 180 tablet 2  . ALPRAZolam (XANAX) 1 MG tablet TAKE 1 TABLET TWICE DAILY. 60 tablet 0  . Cholecalciferol (VITAMIN D PO) Take 1 tablet by mouth daily.     . clobetasol ointment (TEMOVATE) [id number] % Apply 1 application topically 2 (two) times daily.     . clopidogrel (PLAVIX) 75 MG tablet TAKE 1 TABLET ONCE DAILY. 30 tablet 6  . cyanocobalamin (,VITAMIN B-12,) 1000 MCG/ML injection INJECT 1 ML INTO THE MUSCLE EVERY 30 DAYS. 10 mL 5  . diclofenac sodium (VOLTAREN) 1 % GEL Apply  To feet 3 times daily prn for pain 1 Tube 5  . furosemide (LASIX) 20 MG tablet TAKE 1 TABLET DAILY AS NEEDED. 30 tablet 0  . Glucosamine HCl 750 MG TABS Take 1 tablet by mouth 2 (two) times daily.     Marland Kitchen HALCION 0.25 MG tablet TAKE 1/2 TABLET AT BEDTIME AS NEEDED FOR SLEEP. 45 tablet 0  . Misc Natural  Products (TUMERSAID) TABS Take 1 capsule by mouth 2 (two) times daily. 60 tablet 3  . nitroGLYCERIN (NITROSTAT) 0.4 MG SL tablet Place 1 tablet (0.4 mg total) under the tongue every 5 (five) minutes as needed for chest pain. 50 tablet prn  . nortriptyline (PAMELOR) 25 MG capsule TAKE 1 CAPSULE AT BEDTIME. 90 capsule 3  . pantoprazole (PROTONIX) 40 MG tablet Take 40 mg by mouth daily.    Marland Kitchen perphenazine (TRILAFON) 2 MG tablet TAKE ONE TABLET AT BEDTIME. 30 tablet 3  . pravastatin (PRAVACHOL) 20 MG tablet TAKE (1/2) TABLET DAILY. 45 tablet 0  . solifenacin (VESICARE) 10 MG tablet Take 10 mg by mouth daily as needed (to reduce number of bathroom visits during travel).      No current facility-administered medications on file prior to visit.    Past Medical History  Diagnosis Date  . Osteoarthritis   . Bladder incontinence   . Constipation     hx of  . Hypertension   . Hyperlipidemia   . Chest pain     S/P nuclear 07/17/2008  normal; EF 88% with no ischemia  . Chronic venous insufficiency     BLE edema, chronic, neg venous US 2/13  . History of chicken pox   . Depression   . Diverticulitis   . Polyp of colon   . Osteoporosis, postmenopausal 06/02/2011  . Esophageal stricture     s/p dilation 04/2012  . GERD (gastroesophageal reflux disease)   . Stroke [facility]) 07/03/2014    Past Surgical History  Procedure Laterality Date  . Appendectomy      age 89  . Tubal ligation    . Polypectomy    . Colonoscopy    . Upper gastrointestinal endoscopy      Social History   Social History  . Marital Status: Widowed    Spouse Name: N/A  . Number of Children: N/A  . Years of Education: N/A   Social History Main Topics  . Smoking status: Never Smoker   . Smokeless tobacco: Never Used  . Alcohol Use: No  . Drug Use: No  . Sexual Activity: Not Asked   Other Topics Concern  . None   Social History Narrative   She lives alone in [place] home, but stays on the first floor.    Family History  Problem Relation Age of Onset  . Cancer Son     testicular cancer  . Colon cancer Neg Hx   . Esophageal cancer Neg Hx   . Rectal cancer Neg Hx   . Stomach cancer Neg Hx   . Breast cancer Sister   . Skin cancer Sister     Review of Systems  Constitutional: Positive for fatigue. Negative for fever and chills.  HENT: Positive for congestion, rhinorrhea and sneezing. Negative for ear pain, sinus pressure and sore throat.   Respiratory: Positive for shortness of breath (exertion, chronic - deconditioning). Negative for cough and wheezing.   Cardiovascular: Positive for chest pain (chronic) and palpitations (intermittent, no change, chronic). Negative for  leg swelling.  Neurological: Positive for weakness (legs) and headaches.  Psychiatric/Behavioral: Positive for dysphoric mood.       Objective:   Filed Vitals:   06/27/15 1548  BP: 138/76  Pulse: 91  Temp: 98.1 F (36.7 C)  Resp: 18   Filed Weights   06/27/15 1548  Weight: 163 lb (73.936 kg)   Body mass index is 31.83 kg/(m^2).   Physical Exam  Constitutional:  Elderly female  in no acute distress  Neck: Neck supple. No tracheal deviation present. No thyromegaly present.  Cardiovascular: Normal rate and regular rhythm.   Pulmonary/Chest: Effort normal and breath sounds normal. No respiratory distress. She has no wheezes. She has no rales.  Musculoskeletal: She exhibits no edema.  Lymphadenopathy:    She has no cervical adenopathy.  Skin: Skin is warm and dry.  Psychiatric: She has a normal mood and affect. Her behavior is normal.        Assessment & Plan:   Frontal headaches Chronic in nature She has discussed with her neurologist The headaches improved for a while with ginger supplements Possible allergy related She is currently taking claritin and flonase - she will continue Will discuss with neurology at her next appointment  Depression She is on nortriptyline for her peripheral neuropathy - she does feel tired in the morning and I do not think we can increase the dose She also is taking xanax and halcion She drinks 1-2 glasses of wine at night She is also taking perphenazine and has been on that for years -- I am less familiar with that medication Given her age and other medications I think she would benefit from seeing a psychiatrist to help adjust her medication Her caregiver agrees so I will refer today  Advised decreasing alcohol intake  She will discuss with neurology regarding the parkinson's medication  B12 deficiency B12 injection today  Follow up with pcp

## 2015-06-27 NOTE — Patient Instructions (Signed)
Continue allergy medication  Consider moving up your neurology appointment  A referral was ordered for psychiatry to help with your depression and review/revise your medications.

## 2015-06-27 NOTE — Progress Notes (Signed)
Pre visit review using our clinic review tool, if applicable. No additional management support is needed unless otherwise documented below in the visit note. 

## 2015-06-29 ENCOUNTER — Other Ambulatory Visit: Payer: Self-pay | Admitting: Internal Medicine

## 2015-06-30 ENCOUNTER — Telehealth: Payer: Self-pay | Admitting: Neurology

## 2015-06-30 DIAGNOSIS — I872 Venous insufficiency (chronic) (peripheral): Secondary | ICD-10-CM | POA: Diagnosis not present

## 2015-06-30 DIAGNOSIS — I69354 Hemiplegia and hemiparesis following cerebral infarction affecting left non-dominant side: Secondary | ICD-10-CM | POA: Diagnosis not present

## 2015-06-30 DIAGNOSIS — G629 Polyneuropathy, unspecified: Secondary | ICD-10-CM | POA: Diagnosis not present

## 2015-06-30 DIAGNOSIS — M6281 Muscle weakness (generalized): Secondary | ICD-10-CM | POA: Diagnosis not present

## 2015-06-30 DIAGNOSIS — M170 Bilateral primary osteoarthritis of knee: Secondary | ICD-10-CM | POA: Diagnosis not present

## 2015-06-30 DIAGNOSIS — M47817 Spondylosis without myelopathy or radiculopathy, lumbosacral region: Secondary | ICD-10-CM | POA: Diagnosis not present

## 2015-06-30 NOTE — Telephone Encounter (Signed)
Called [name] back and she said that patient has been having a lot of headaches lately.  Dr. Quay Burow would like for her appointment to be moved up to see Dr. Posey Pronto.  He also wants her taken off some of the pm medicines.  Please advise.

## 2015-06-30 NOTE — Telephone Encounter (Signed)
Can she come in tomorrow at 11am?    [place]. Posey Pronto, DO

## 2015-06-30 NOTE — Telephone Encounter (Signed)
Spoke to Dazey and she will have patient here tomorrow.

## 2015-06-30 NOTE — Telephone Encounter (Signed)
PT's caregiver [name] called and  would like a call back at [phone]

## 2015-07-01 ENCOUNTER — Ambulatory Visit (INDEPENDENT_AMBULATORY_CARE_PROVIDER_SITE_OTHER): Payer: Medicare Other | Admitting: Neurology

## 2015-07-01 ENCOUNTER — Encounter: Payer: Self-pay | Admitting: Neurology

## 2015-07-01 VITALS — BP 110/78 | HR 100 | Wt 162.4 lb

## 2015-07-01 DIAGNOSIS — M170 Bilateral primary osteoarthritis of knee: Secondary | ICD-10-CM | POA: Diagnosis not present

## 2015-07-01 DIAGNOSIS — F329 Major depressive disorder, single episode, unspecified: Secondary | ICD-10-CM | POA: Diagnosis not present

## 2015-07-01 DIAGNOSIS — M6281 Muscle weakness (generalized): Secondary | ICD-10-CM | POA: Diagnosis not present

## 2015-07-01 DIAGNOSIS — G4489 Other headache syndrome: Secondary | ICD-10-CM

## 2015-07-01 DIAGNOSIS — N390 Urinary tract infection, site not specified: Secondary | ICD-10-CM | POA: Diagnosis not present

## 2015-07-01 DIAGNOSIS — G4440 Drug-induced headache, not elsewhere classified, not intractable: Secondary | ICD-10-CM

## 2015-07-01 DIAGNOSIS — I69354 Hemiplegia and hemiparesis following cerebral infarction affecting left non-dominant side: Secondary | ICD-10-CM | POA: Diagnosis not present

## 2015-07-01 DIAGNOSIS — I25119 Atherosclerotic heart disease of native coronary artery with unspecified angina pectoris: Secondary | ICD-10-CM | POA: Diagnosis not present

## 2015-07-01 DIAGNOSIS — G609 Hereditary and idiopathic neuropathy, unspecified: Secondary | ICD-10-CM

## 2015-07-01 DIAGNOSIS — F419 Anxiety disorder, unspecified: Secondary | ICD-10-CM | POA: Diagnosis not present

## 2015-07-01 DIAGNOSIS — M47817 Spondylosis without myelopathy or radiculopathy, lumbosacral region: Secondary | ICD-10-CM | POA: Diagnosis not present

## 2015-07-01 DIAGNOSIS — G20 Parkinson's disease: Secondary | ICD-10-CM

## 2015-07-01 DIAGNOSIS — Z9181 History of falling: Secondary | ICD-10-CM | POA: Diagnosis not present

## 2015-07-01 DIAGNOSIS — Z602 Problems related to living alone: Secondary | ICD-10-CM | POA: Diagnosis not present

## 2015-07-01 DIAGNOSIS — I10 Essential (primary) hypertension: Secondary | ICD-10-CM | POA: Diagnosis not present

## 2015-07-01 DIAGNOSIS — Z7901 Long term (current) use of anticoagulants: Secondary | ICD-10-CM | POA: Diagnosis not present

## 2015-07-01 DIAGNOSIS — F32A Depression, unspecified: Secondary | ICD-10-CM

## 2015-07-01 DIAGNOSIS — I872 Venous insufficiency (chronic) (peripheral): Secondary | ICD-10-CM | POA: Diagnosis not present

## 2015-07-01 DIAGNOSIS — G629 Polyneuropathy, unspecified: Secondary | ICD-10-CM | POA: Diagnosis not present

## 2015-07-01 DIAGNOSIS — G4441 Drug-induced headache, not elsewhere classified, intractable: Secondary | ICD-10-CM

## 2015-07-01 DIAGNOSIS — I69320 Aphasia following cerebral infarction: Secondary | ICD-10-CM | POA: Diagnosis not present

## 2015-07-01 MED ORDER — CARBIDOPA-LEVODOPA 25-100 MG PO TABS: 1.0000 | ORAL_TABLET | Freq: Three times a day (TID) | ORAL | Status: DC

## 2015-07-01 NOTE — Patient Instructions (Addendum)
1.  Start Carbidopa Levodopa as follows at least 30-min prior to meals:     AM  Afternoon   Evening   Week 1:  1/2 tab  1/2 tab   1/2 tab  Week 2:   1/2 tab  1/2 tab   1 tab  Week 3:  1/2 tab  1 tab   1 tab  Week 4:  1 tab  1 tab   1 tab  *Avoid taking with protein products, such as milk, meat, cheese  *if you develop nausea, take with crackers  2.  Limit tylenol and excedrin to twice per week  3.  Continue to treat allergies with over the counter medications  4.  Agree with seeing a psychiatrist for depression

## 2015-07-01 NOTE — Progress Notes (Signed)
[facility]  Follow-up Visit   Date: 07/01/2015   Sarah [name] MRN: [id number] DOB: 01-04-1927   Interim History: Sarah [name] is a 82 y.o. right-handed Caucasian female with history of HTN, HPL, depression, esophageal strictures, and GERD returning to the clinic for follow-up of worsening headaches and gait instability.  The patient was accompanied to the clinic by her caregiver, [name].  History of present illness: Leg weakness started slowly around May 2014 when she noticed difficulty with walking and tripping on things. She would stumble frequently, no falls.  She has associated bilateral achy knee pain and chronic back pain but had worsening pain in August 2014. Pain is achy located at the low back and does not radiate into her buttocks or legs. Forward flexion makes her pain worse and it is improved by laying down. EMG was performed and shows moderate peripheral neuropathy of the legs and L3-L4 old radiculopathy.   Follow-up 10/09/2013:  She had an unwittnessed fall on 6/17 while transferring a few things to her dining table.  She hit the back of her head and bruised her right wrist.  Caregiver says that she is more forgetful than usual, especially with conversation.  Since her fall, she has had intermittent headaches, described as achy, occuring daily and last all day. It usually responds to tylenol.    UPDATE 09/02/2014:  Patient had an interval hospitalization for acute stroke manifested with slurred speech which has since resolved.  She called for same-day appointment because of severe bifrontal headache since last week.  She went to the ED 6/2 where tramadol was given and provided relief. She is getting home PT and feels that she may have over exerted herself lately which could have triggered her migraine.    UPDATE 09/20/2014:  She is so relieved that her headaches improved with prednisone.  She is doing well and has not had any further headaches, slurred  speech, or falls.    UPDATE 12/27/2014:   She started having headaches several weeks ago which are bifrontal and throbbing. She went to urgent care on 9/26 where she was givne prednisone and amoxicillin for possible sinusitis. She tried excedrin migraine did not provide any relief.  No worsening of headache when laying flat, with sneezing/coughing, and she denies any vision changes.  Headache is similar to what she has experienced in the past. She was give tizanidine, but did not tolerate this.  Neck PT helped.   UPDATE 05/05/2015:  She is complaining of joint pain, especially in the knees and leg swelling.  Her headaches are much improved since she started taking ginger supplements.  She denies painful paresthesias of the feet, but does feel like her balance is becoming worse.  Fortunately, there have been no interval falls.  She continues to use a rollator.    UPDATE 07/01/2015:  Patient called for urgent visit due to worsening headaches and gait instability.  She arrived 15 minutes late to her appointment.  About 3-4 weeks ago, she began having rhinitis, head congestion, and headaches. [name] suggested this may be due to allergies, which she often gets, so started taking claritin and flonase which does provide relief.  Patient also has been taking tylenol or excedrin daily for the past month.    She reports having a stumble a few weeks ago, but her life alert did not work.  Fortunately, she was outside trying to pick up a piece of paper on the floor and some neighbors helped her up.  She is very depressed and lacks the motivation and interest to engage in physical therapy or other activities she usually would enjoy.  She is worried that she will be unable to attend her granddaughter's wedding in [place].  Her memory has been much worse lately and she often forgets what she is saying in mid conversation.   Medications:  Current Outpatient Prescriptions on File Prior to Visit  Medication Sig Dispense  Refill  . acetaminophen (TYLENOL) 500 MG tablet Take 1 tablet (500 mg total) by mouth every 8 (eight) hours as needed (for pain). 180 tablet 2  . ALPRAZolam (XANAX) 1 MG tablet TAKE 1 TABLET TWICE DAILY. 60 tablet 0  . Cholecalciferol (VITAMIN D PO) Take 1 tablet by mouth daily.     . clobetasol ointment (TEMOVATE) AB-123456789 % Apply 1 application topically 2 (two) times daily.     . clopidogrel (PLAVIX) 75 MG tablet TAKE 1 TABLET ONCE DAILY. 30 tablet 6  . cyanocobalamin (,VITAMIN B-12,) 1000 MCG/ML injection INJECT 1 ML INTO THE MUSCLE EVERY 30 DAYS. 10 mL 5  . diclofenac sodium (VOLTAREN) 1 % GEL Apply  To feet 3 times daily prn for pain 1 Tube 5  . furosemide (LASIX) 20 MG tablet TAKE 1 TABLET DAILY AS NEEDED. 30 tablet 0  . Glucosamine HCl 750 MG TABS Take 1 tablet by mouth 2 (two) times daily.     Marland Kitchen HALCION 0.25 MG tablet TAKE 1/2 TABLET AT BEDTIME AS NEEDED FOR SLEEP. 45 tablet 0  . Misc Natural Products (TUMERSAID) TABS Take 1 capsule by mouth 2 (two) times daily. 60 tablet 3  . nitroGLYCERIN (NITROSTAT) 0.4 MG SL tablet Place 1 tablet (0.4 mg total) under the tongue every 5 (five) minutes as needed for chest pain. 50 tablet prn  . nortriptyline (PAMELOR) 25 MG capsule TAKE 1 CAPSULE AT BEDTIME. 90 capsule 3  . pantoprazole (PROTONIX) 40 MG tablet Take 40 mg by mouth daily.    Marland Kitchen perphenazine (TRILAFON) 2 MG tablet TAKE ONE TABLET AT BEDTIME. 30 tablet 3  . pravastatin (PRAVACHOL) 20 MG tablet TAKE (1/2) TABLET DAILY. 45 tablet 0  . solifenacin (VESICARE) 10 MG tablet Take 10 mg by mouth daily as needed (to reduce number of bathroom visits during travel).     Current Facility-Administered Medications on File Prior to Visit  Medication Dose Route Frequency Provider Last Rate Last Dose  . cyanocobalamin ((VITAMIN B-12)) injection 1,000 mcg  1,000 mcg Intramuscular Q30 days Binnie Rail, MD   1,000 mcg at 06/27/15 1545    Allergies:  Allergies  Allergen Reactions  . Celebrex [Celecoxib]      rash  . Lipitor [Atorvastatin Calcium]     Leg weakness,memory loss  . Lovastatin     Leg weakness  . Macrobid [employer] Macro]   . Other     Always pads  . Latex Rash     Review of Systems:  CONSTITUTIONAL: No fevers, chills, night sweats, or weight loss.   EYES: No visual changes or eye pain ENT: No hearing changes.  No history of nose bleeds. RESPIRATORY: No cough, wheezing and shortness of breath.   CARDIOVASCULAR: Negative for chest pain, and palpitations.   GI: Negative for abdominal discomfort, blood in stools or black stools.  No recent change in bowel habits.  GU:  No history of incontinence.   MUSCLOSKELETAL: +history of joint pain or swelling.  +myalgias.  + back pain SKIN: Negative for lesions, rash, and itching.   HEMATOLOGY/ONCOLOGY:  Negative for prolonged bleeding, bruising easily, and swollen nodes.   ENDOCRINE: Negative for cold or heat intolerance, polydipsia or goiter.   PSYCH:  +depression or anxiety symptoms.   NEURO: As Above.   Vital Signs:  BP 110/78 mmHg  Pulse 100  Wt 162 lb 6 oz (73.653 kg)  SpO2 96%  Neurological Exam: MENTAL STATUS including orientation to time, place, person, recent and remote memory is fairly intact. She easily get distracted and forgets her thoughts.  Moderately blunted affect. No dysarthria.   CRANIAL NERVES:  Pupils round and reactive to light. Extraocular muscles are intact. Face is symmetric. Palate elevates symmetrically and tongue is midline.    MOTOR:  Motor strength is 5/5 in proximal muscles. Tone is normal throughout.  There is very mild resting hand tremor on the right (new)  SENSORY:  Absent vibration distal to ankles.  COORDINATION/GAIT:  Slowed movement throughout, including finger tapping and heel tapping.  Slow, shuffling, gait pattern.  Data:  EMG 01/15/2013:  Taken together, these findings are consistent with a moderate generalized, large fiber, sensorimotor polyneuropathy affecting the  left side. There is a superimposed intraspinal canal lesion (i.e. radiculopathy) affecting the L2-L4 nerve root/segments bilaterally, moderate in degree electrically.  CT brain 10/15/2013: No skull fracture or intracranial hemorrhage. Fluid level within the right maxillary sinus may be related to sinus  disease without secondary findings of orbital injury. If orbital injury were of clinical concern, CT orbits recommended for further  delineation. Prominent small vessel disease type changes without CT evidence of large acute infarct.  Mild global atrophy without hydrocephalus.  MRI/A Brain 07/03/2014: 1. Acute nonhemorrhagic 21 mm white matter infarct of the posterior right frontal lobe. 2. Extensive atrophy and diffuse white matter disease corresponds to advanced chronic microvascular ischemia. 3. Moderate to high-grade stenosis of the infra posterior right M2 branch. 4. Moderate small vessel disease is evident on the MRA. 5. High-grade stenosis in the proximal left P2 segment.  US carotids 07/04/2014:  1-39% ICA stenosis bilaterally TTE 07/04/2014:  Within normal limits, no thrombus   IMPRESSION/PLAN:   1.  Parkinsonism  Exam shows blunted affect, slightly slowed movements, stooped posture with shuffling gait, mild resting right hand tremor  - Previously, patient has been reluctant to start sinemet for various reasons  - Today, she is agreeable to starting sinemet 25/100 half tab TID and titrate to 1 tab TID (titration schedule provided).  Side effects discussed  - Fall precautions discussed  2. Medication overuse headaches  - Limit tylenol and Excedrin to twice per week  - She is not interested in prednisone or muscle relaxants  3.  Sinus congestion, seasonal allergies likely contributing to headaches   - Continue to treat allergies with claritin    4.  Peripheral neuropathy causing painful paresthesias and gait instability   - Continue nortriptyline 25mg  qhs which controls her painful  paresthesias, avoid increasing the dose due to sedation   - May switch her to Lyrica going forward  - I discussed using a wheelchair for long distances  5.  Cognitive impairment due to probable dementia and depressoin.    - As to avoid starting too many medications at the same time, we elected to hold on starting Aricept today.   - I agree with referral to psychiatry as she endorses depressed feeling for over the past year.    Return to clinic in 31-months   The duration of this appointment visit was 45 minutes of face-to-face time with the patient.  Greater than 50% of this time was spent in counseling, explanation of diagnosis, planning of further management, and coordination of care.   Thank you for allowing me to participate in patient's care.  If I can answer any additional questions, I would be pleased to do so.    Sincerely,    Byren Pankow K. Posey Pronto, DO

## 2015-07-03 ENCOUNTER — Telehealth: Payer: Self-pay

## 2015-07-03 DIAGNOSIS — I69354 Hemiplegia and hemiparesis following cerebral infarction affecting left non-dominant side: Secondary | ICD-10-CM | POA: Diagnosis not present

## 2015-07-03 NOTE — Telephone Encounter (Signed)
Home Health Cert/Plan of Care received (07/01/2015 - 08/29/2015) and placed on MD's desk for signature

## 2015-07-04 ENCOUNTER — Telehealth: Payer: Self-pay | Admitting: Neurology

## 2015-07-04 DIAGNOSIS — M170 Bilateral primary osteoarthritis of knee: Secondary | ICD-10-CM | POA: Diagnosis not present

## 2015-07-04 DIAGNOSIS — F419 Anxiety disorder, unspecified: Secondary | ICD-10-CM | POA: Diagnosis not present

## 2015-07-04 DIAGNOSIS — M47817 Spondylosis without myelopathy or radiculopathy, lumbosacral region: Secondary | ICD-10-CM | POA: Diagnosis not present

## 2015-07-04 DIAGNOSIS — I69354 Hemiplegia and hemiparesis following cerebral infarction affecting left non-dominant side: Secondary | ICD-10-CM | POA: Diagnosis not present

## 2015-07-04 DIAGNOSIS — M6281 Muscle weakness (generalized): Secondary | ICD-10-CM | POA: Diagnosis not present

## 2015-07-04 DIAGNOSIS — N390 Urinary tract infection, site not specified: Secondary | ICD-10-CM | POA: Diagnosis not present

## 2015-07-04 NOTE — Telephone Encounter (Signed)
Called [name] and instructed her to have patient take 1/2 tablet of sinemet with crackers.  Do not take with meats or cheese per Dr. Posey Pronto.

## 2015-07-04 NOTE — Telephone Encounter (Signed)
PT's care giver [name] called in regards to her having some nausea/Dawn CB# [phone]

## 2015-07-07 ENCOUNTER — Other Ambulatory Visit: Payer: Self-pay | Admitting: Internal Medicine

## 2015-07-07 DIAGNOSIS — I69354 Hemiplegia and hemiparesis following cerebral infarction affecting left non-dominant side: Secondary | ICD-10-CM | POA: Diagnosis not present

## 2015-07-07 DIAGNOSIS — M47817 Spondylosis without myelopathy or radiculopathy, lumbosacral region: Secondary | ICD-10-CM | POA: Diagnosis not present

## 2015-07-07 DIAGNOSIS — M6281 Muscle weakness (generalized): Secondary | ICD-10-CM | POA: Diagnosis not present

## 2015-07-07 DIAGNOSIS — N390 Urinary tract infection, site not specified: Secondary | ICD-10-CM | POA: Diagnosis not present

## 2015-07-07 DIAGNOSIS — F419 Anxiety disorder, unspecified: Secondary | ICD-10-CM | POA: Diagnosis not present

## 2015-07-07 DIAGNOSIS — M170 Bilateral primary osteoarthritis of knee: Secondary | ICD-10-CM | POA: Diagnosis not present

## 2015-07-07 NOTE — Telephone Encounter (Signed)
Sent to pharmacy 

## 2015-07-14 ENCOUNTER — Telehealth: Payer: Self-pay | Admitting: Neurology

## 2015-07-14 DIAGNOSIS — M47817 Spondylosis without myelopathy or radiculopathy, lumbosacral region: Secondary | ICD-10-CM | POA: Diagnosis not present

## 2015-07-14 DIAGNOSIS — I69354 Hemiplegia and hemiparesis following cerebral infarction affecting left non-dominant side: Secondary | ICD-10-CM | POA: Diagnosis not present

## 2015-07-14 DIAGNOSIS — F419 Anxiety disorder, unspecified: Secondary | ICD-10-CM | POA: Diagnosis not present

## 2015-07-14 DIAGNOSIS — M170 Bilateral primary osteoarthritis of knee: Secondary | ICD-10-CM | POA: Diagnosis not present

## 2015-07-14 DIAGNOSIS — M6281 Muscle weakness (generalized): Secondary | ICD-10-CM | POA: Diagnosis not present

## 2015-07-14 DIAGNOSIS — N390 Urinary tract infection, site not specified: Secondary | ICD-10-CM | POA: Diagnosis not present

## 2015-07-14 NOTE — Telephone Encounter (Signed)
Please inform the patient that for her nausea associated with sinemet, we can start carbidopa 25mg  1 tablet TID.  This will prevent breakdown of the levodopa which causes the side effects.  She should take this with sinemet to see if that will reduce nausea.  Did she start on half-tablet three times daily?  If so, she can reduce it to twice daily and then work up on the dose slowly.    Sarah [name] K. Posey Pronto, DO

## 2015-07-14 NOTE — Telephone Encounter (Signed)
I spoke with patient and her daughter and patient is doing much better.  They have started taking 1/4 tablet in the am and then another 1/4 an hour later.  They are doing the same thing at lunch.  At bedtime she takes 1/2 tablet and then another 1/2 and hour later.  Instructed them to keep on this regimen since it is working for the patient.  Dr. Posey Pronto informed and agreed.

## 2015-07-17 ENCOUNTER — Telehealth: Payer: Self-pay | Admitting: Neurology

## 2015-07-17 NOTE — Telephone Encounter (Signed)
[name] (caregiver) for Sarah [name] called and just wanted to make you and Dr.Patel aware that [name] lost her balance today and fell today. She is ok. They did have EMT come out and look at her. She is a little bruised up. Her number is [phone].Thank you

## 2015-07-18 DIAGNOSIS — I69354 Hemiplegia and hemiparesis following cerebral infarction affecting left non-dominant side: Secondary | ICD-10-CM | POA: Diagnosis not present

## 2015-07-18 DIAGNOSIS — M170 Bilateral primary osteoarthritis of knee: Secondary | ICD-10-CM | POA: Diagnosis not present

## 2015-07-18 DIAGNOSIS — M6281 Muscle weakness (generalized): Secondary | ICD-10-CM | POA: Diagnosis not present

## 2015-07-18 DIAGNOSIS — F419 Anxiety disorder, unspecified: Secondary | ICD-10-CM | POA: Diagnosis not present

## 2015-07-18 DIAGNOSIS — N390 Urinary tract infection, site not specified: Secondary | ICD-10-CM | POA: Diagnosis not present

## 2015-07-18 DIAGNOSIS — M47817 Spondylosis without myelopathy or radiculopathy, lumbosacral region: Secondary | ICD-10-CM | POA: Diagnosis not present

## 2015-07-18 NOTE — Telephone Encounter (Signed)
Dr. Patel notified.

## 2015-07-21 DIAGNOSIS — M6281 Muscle weakness (generalized): Secondary | ICD-10-CM | POA: Diagnosis not present

## 2015-07-21 DIAGNOSIS — M170 Bilateral primary osteoarthritis of knee: Secondary | ICD-10-CM | POA: Diagnosis not present

## 2015-07-21 DIAGNOSIS — M47817 Spondylosis without myelopathy or radiculopathy, lumbosacral region: Secondary | ICD-10-CM | POA: Diagnosis not present

## 2015-07-21 DIAGNOSIS — F419 Anxiety disorder, unspecified: Secondary | ICD-10-CM | POA: Diagnosis not present

## 2015-07-21 DIAGNOSIS — N390 Urinary tract infection, site not specified: Secondary | ICD-10-CM | POA: Diagnosis not present

## 2015-07-21 DIAGNOSIS — I69354 Hemiplegia and hemiparesis following cerebral infarction affecting left non-dominant side: Secondary | ICD-10-CM | POA: Diagnosis not present

## 2015-07-25 DIAGNOSIS — M47817 Spondylosis without myelopathy or radiculopathy, lumbosacral region: Secondary | ICD-10-CM | POA: Diagnosis not present

## 2015-07-25 DIAGNOSIS — I69354 Hemiplegia and hemiparesis following cerebral infarction affecting left non-dominant side: Secondary | ICD-10-CM | POA: Diagnosis not present

## 2015-07-25 DIAGNOSIS — F419 Anxiety disorder, unspecified: Secondary | ICD-10-CM | POA: Diagnosis not present

## 2015-07-25 DIAGNOSIS — N390 Urinary tract infection, site not specified: Secondary | ICD-10-CM | POA: Diagnosis not present

## 2015-07-25 DIAGNOSIS — M6281 Muscle weakness (generalized): Secondary | ICD-10-CM | POA: Diagnosis not present

## 2015-07-25 DIAGNOSIS — M170 Bilateral primary osteoarthritis of knee: Secondary | ICD-10-CM | POA: Diagnosis not present

## 2015-07-28 DIAGNOSIS — I69354 Hemiplegia and hemiparesis following cerebral infarction affecting left non-dominant side: Secondary | ICD-10-CM | POA: Diagnosis not present

## 2015-07-28 DIAGNOSIS — M47817 Spondylosis without myelopathy or radiculopathy, lumbosacral region: Secondary | ICD-10-CM | POA: Diagnosis not present

## 2015-07-28 DIAGNOSIS — N390 Urinary tract infection, site not specified: Secondary | ICD-10-CM | POA: Diagnosis not present

## 2015-07-28 DIAGNOSIS — M170 Bilateral primary osteoarthritis of knee: Secondary | ICD-10-CM | POA: Diagnosis not present

## 2015-07-28 DIAGNOSIS — M6281 Muscle weakness (generalized): Secondary | ICD-10-CM | POA: Diagnosis not present

## 2015-07-28 DIAGNOSIS — F419 Anxiety disorder, unspecified: Secondary | ICD-10-CM | POA: Diagnosis not present

## 2015-07-29 ENCOUNTER — Other Ambulatory Visit: Payer: Self-pay | Admitting: Internal Medicine

## 2015-07-29 NOTE — Telephone Encounter (Signed)
Faxed script back to [place].../lmb 

## 2015-08-01 ENCOUNTER — Telehealth: Payer: Self-pay | Admitting: Neurology

## 2015-08-01 DIAGNOSIS — M6281 Muscle weakness (generalized): Secondary | ICD-10-CM | POA: Diagnosis not present

## 2015-08-01 DIAGNOSIS — I69354 Hemiplegia and hemiparesis following cerebral infarction affecting left non-dominant side: Secondary | ICD-10-CM | POA: Diagnosis not present

## 2015-08-01 DIAGNOSIS — M170 Bilateral primary osteoarthritis of knee: Secondary | ICD-10-CM | POA: Diagnosis not present

## 2015-08-01 DIAGNOSIS — F419 Anxiety disorder, unspecified: Secondary | ICD-10-CM | POA: Diagnosis not present

## 2015-08-01 DIAGNOSIS — N390 Urinary tract infection, site not specified: Secondary | ICD-10-CM | POA: Diagnosis not present

## 2015-08-01 DIAGNOSIS — M47817 Spondylosis without myelopathy or radiculopathy, lumbosacral region: Secondary | ICD-10-CM | POA: Diagnosis not present

## 2015-08-01 NOTE — Telephone Encounter (Signed)
Patient said that the nausea was not as bad as it was.  She will try to eat a little bit of food when she takes it and then wait 30 minutes to eat her meal.

## 2015-08-01 NOTE — Telephone Encounter (Signed)
Sarah [name] 12/04/1926. She is taking careidopalevodopa and it says to take 30 min after food. She was wondering if it is ok to take with food because she is feeling nauseated. Her number is [phone]. Thank you

## 2015-08-06 ENCOUNTER — Other Ambulatory Visit: Payer: Self-pay | Admitting: Internal Medicine

## 2015-08-08 DIAGNOSIS — M6281 Muscle weakness (generalized): Secondary | ICD-10-CM | POA: Diagnosis not present

## 2015-08-08 DIAGNOSIS — I69354 Hemiplegia and hemiparesis following cerebral infarction affecting left non-dominant side: Secondary | ICD-10-CM | POA: Diagnosis not present

## 2015-08-08 DIAGNOSIS — N390 Urinary tract infection, site not specified: Secondary | ICD-10-CM | POA: Diagnosis not present

## 2015-08-08 DIAGNOSIS — M170 Bilateral primary osteoarthritis of knee: Secondary | ICD-10-CM | POA: Diagnosis not present

## 2015-08-08 DIAGNOSIS — M47817 Spondylosis without myelopathy or radiculopathy, lumbosacral region: Secondary | ICD-10-CM | POA: Diagnosis not present

## 2015-08-08 DIAGNOSIS — F419 Anxiety disorder, unspecified: Secondary | ICD-10-CM | POA: Diagnosis not present

## 2015-08-11 DIAGNOSIS — I69354 Hemiplegia and hemiparesis following cerebral infarction affecting left non-dominant side: Secondary | ICD-10-CM | POA: Diagnosis not present

## 2015-08-11 DIAGNOSIS — F419 Anxiety disorder, unspecified: Secondary | ICD-10-CM | POA: Diagnosis not present

## 2015-08-11 DIAGNOSIS — M47817 Spondylosis without myelopathy or radiculopathy, lumbosacral region: Secondary | ICD-10-CM | POA: Diagnosis not present

## 2015-08-11 DIAGNOSIS — M6281 Muscle weakness (generalized): Secondary | ICD-10-CM | POA: Diagnosis not present

## 2015-08-11 DIAGNOSIS — N390 Urinary tract infection, site not specified: Secondary | ICD-10-CM | POA: Diagnosis not present

## 2015-08-11 DIAGNOSIS — M170 Bilateral primary osteoarthritis of knee: Secondary | ICD-10-CM | POA: Diagnosis not present

## 2015-08-15 DIAGNOSIS — M47817 Spondylosis without myelopathy or radiculopathy, lumbosacral region: Secondary | ICD-10-CM | POA: Diagnosis not present

## 2015-08-15 DIAGNOSIS — F419 Anxiety disorder, unspecified: Secondary | ICD-10-CM | POA: Diagnosis not present

## 2015-08-15 DIAGNOSIS — M170 Bilateral primary osteoarthritis of knee: Secondary | ICD-10-CM | POA: Diagnosis not present

## 2015-08-15 DIAGNOSIS — M6281 Muscle weakness (generalized): Secondary | ICD-10-CM | POA: Diagnosis not present

## 2015-08-15 DIAGNOSIS — I69354 Hemiplegia and hemiparesis following cerebral infarction affecting left non-dominant side: Secondary | ICD-10-CM | POA: Diagnosis not present

## 2015-08-15 DIAGNOSIS — N390 Urinary tract infection, site not specified: Secondary | ICD-10-CM | POA: Diagnosis not present

## 2015-08-18 ENCOUNTER — Ambulatory Visit (INDEPENDENT_AMBULATORY_CARE_PROVIDER_SITE_OTHER): Payer: Medicare Other | Admitting: Cardiology

## 2015-08-18 ENCOUNTER — Encounter: Payer: Self-pay | Admitting: Cardiology

## 2015-08-18 VITALS — BP 124/74 | HR 85 | Ht 60.0 in | Wt 165.0 lb

## 2015-08-18 DIAGNOSIS — I63341 Cerebral infarction due to thrombosis of right cerebellar artery: Secondary | ICD-10-CM | POA: Diagnosis not present

## 2015-08-18 DIAGNOSIS — G20 Parkinson's disease: Secondary | ICD-10-CM

## 2015-08-18 DIAGNOSIS — R079 Chest pain, unspecified: Secondary | ICD-10-CM

## 2015-08-18 DIAGNOSIS — I119 Hypertensive heart disease without heart failure: Secondary | ICD-10-CM | POA: Diagnosis not present

## 2015-08-18 DIAGNOSIS — E7800 Pure hypercholesterolemia, unspecified: Secondary | ICD-10-CM

## 2015-08-18 NOTE — Patient Instructions (Signed)

## 2015-08-18 NOTE — Progress Notes (Addendum)
Cardiology Office Note    Date:  08/18/2015   ID:  ALIEA BEHRNS, DOB [birth year], MRN [id number]  PCP:  Hoyt Koch, MD  Cardiologist:   Candee Furbish, MD     History of Present Illness:  Sarah [name] is a 82 y.o. female former patient of Dr. Sherryl Barters with history of atypical chest pain, no ischemic heart disease, normal nuclear stress test 06/2010 with EF of 88% with history of hyperlipidemia, statin intolerant because of leg weakness and memory loss here for follow-up. Parkinson  Occasional chest discomfort relieved with sublingual nitroglycerin noted. She is also being treated for neuropathy by Dr. Posey Pronto. She is no longer allowed to drive because it affects her feet. She has a driver that helps her with appointments and shopping.  She lives alone. In July 2016 she had a right brain stroke causing some difficulty with slurring of speech, now on Plavix. She told Dr. Mare Ferrari that she did not want to go to retirement Center if possible and she has a housekeeper to help with meal preparation and housekeeping.  She previously had some peripheral edema, exertional dyspnea, chest x-ray 11/6-8/16 with no acute abnormalities.  With her Parkinson's, she has disequilibrium at times. She has fallen approximate 4 times. No chest pain recently. She states that she has been under increased stress over the last 20 years. Her husband had multiple heart attacks back then.  Past Medical History  Diagnosis Date  . Osteoarthritis   . Bladder incontinence   . Constipation     hx of  . Hypertension   . Hyperlipidemia   . Chest pain     S/P nuclear 07/17/2008  normal; EF 88% with no ischemia  . Chronic venous insufficiency     BLE edema, chronic, neg venous US 2/13  . History of chicken pox   . Depression   . Diverticulitis   . Polyp of colon   . Osteoporosis, postmenopausal 06/02/2011  . Esophageal stricture     s/p dilation 04/2012  . GERD (gastroesophageal reflux disease)   .  Stroke [facility]) 07/03/2014    Past Surgical History  Procedure Laterality Date  . Appendectomy      age 68  . Tubal ligation    . Polypectomy    . Colonoscopy    . Upper gastrointestinal endoscopy      Current Medications: Outpatient Prescriptions Prior to Visit  Medication Sig Dispense Refill  . acetaminophen (TYLENOL) 500 MG tablet Take 1 tablet (500 mg total) by mouth every 8 (eight) hours as needed (for pain). 180 tablet 2  . ALPRAZolam (XANAX) 1 MG tablet TAKE 1 TABLET TWICE DAILY. 60 tablet 1  . carbidopa-levodopa (SINEMET IR) 25-100 MG tablet Take 1 tablet by mouth 3 (three) times daily. 90 tablet 5  . Cholecalciferol (VITAMIN D PO) Take 1 tablet by mouth daily.     . clobetasol ointment (TEMOVATE) [id number] % Apply 1 application topically 2 (two) times daily.     . clopidogrel (PLAVIX) 75 MG tablet TAKE 1 TABLET ONCE DAILY. 30 tablet 6  . cyanocobalamin (,VITAMIN B-12,) 1000 MCG/ML injection INJECT 1 ML INTO THE MUSCLE EVERY 30 DAYS. 10 mL 5  . diclofenac sodium (VOLTAREN) 1 % GEL Apply  To feet 3 times daily prn for pain 1 Tube 5  . furosemide (LASIX) 20 MG tablet TAKE 1 TABLET DAILY AS NEEDED. 30 tablet 0  . Glucosamine HCl 750 MG TABS Take 1 tablet by mouth 2 (two) times daily.     Marland Kitchen  HALCION 0.25 MG tablet TAKE 1/2 TABLET AT BEDTIME AS NEEDED FOR SLEEP. 45 tablet 0  . Misc Natural Products (TUMERSAID) TABS Take 1 capsule by mouth 2 (two) times daily. 60 tablet 3  . nitroGLYCERIN (NITROSTAT) 0.4 MG SL tablet Place 1 tablet (0.4 mg total) under the tongue every 5 (five) minutes as needed for chest pain. 50 tablet prn  . nortriptyline (PAMELOR) 25 MG capsule TAKE 1 CAPSULE AT BEDTIME. 90 capsule 3  . pantoprazole (PROTONIX) 40 MG tablet Take 40 mg by mouth daily.    Marland Kitchen perphenazine (TRILAFON) 2 MG tablet TAKE ONE TABLET AT BEDTIME. 30 tablet 3  . pravastatin (PRAVACHOL) 20 MG tablet TAKE (1/2) TABLET DAILY. 45 tablet 0  . solifenacin (VESICARE) 10 MG tablet Take 10 mg by mouth daily as  needed (to reduce number of bathroom visits during travel).    Marland Kitchen sulfamethoxazole-trimethoprim (BACTRIM DS,SEPTRA DS) 800-160 MG tablet Take 1 tablet by mouth daily.   0   Facility-Administered Medications Prior to Visit  Medication Dose Route Frequency Provider Last Rate Last Dose  . cyanocobalamin ((VITAMIN B-12)) injection 1,000 mcg  1,000 mcg Intramuscular Q30 days Binnie Rail, MD   1,000 mcg at 06/27/15 1545     Allergies:   Celebrex; Lipitor; Lovastatin; Macrobid; Other; and Latex   Social History   Social History  . Marital Status: Widowed    Spouse Name: N/A  . Number of Children: N/A  . Years of Education: N/A   Social History Main Topics  . Smoking status: Never Smoker   . Smokeless tobacco: Never Used  . Alcohol Use: No  . Drug Use: No  . Sexual Activity: Not Asked   Other Topics Concern  . None   Social History Narrative   She lives alone in [place] home, but stays on the first floor.     Family History:  The patient's family history includes Breast cancer in her sister; Cancer in her son; Skin cancer in her sister. There is no history of Colon cancer, Esophageal cancer, Rectal cancer, or Stomach cancer.   ROS:   Please see the history of present illness.   Shortness of breath, depression, back pain, anxiety, balance, leg swelling ROS All other systems reviewed and are negative.   PHYSICAL EXAM:   VS:  BP 124/74 mmHg  Pulse 85  Ht 5' (1.524 m)  Wt 165 lb (74.844 kg)  BMI 32.22 kg/m2   GEN: Well nourished, well developed, in no acute distressElderly, uses a rolling walker HEENT: normal Neck: no JVD, carotid bruits, or masses Cardiac: RRR; no murmurs, rubs, or gallops, edema  Respiratory:  clear to auscultation bilaterally, normal work of breathing GI: soft, nontender, nondistended, + BS MS: no deformity or atrophy, 1+ edema stockings.  Skin: warm and dry, no rash Neuro:  Alert and Oriented x 3, Strength and sensation are intact Psych: euthymic mood,  full affect, somewhat decreased facial expression likely secondary to Parkinson's. Smiles at times.  Wt Readings from Last 3 Encounters:  08/18/15 165 lb (74.844 kg)  07/01/15 162 lb 6 oz (73.653 kg)  06/27/15 163 lb (73.936 kg)      Studies/Labs Reviewed:   EKG:  No new ECG  Recent Labs: 01/29/2015: ALT 13; BUN 12; Creatinine, Ser 0.79; Hemoglobin 11.8*; Platelets 319.0; Potassium 4.4; Sodium 132*; TSH 4.89*   Lipid Panel    Component Value Date/Time   CHOL 163 08/06/2014 0948   TRIG 87.0 08/06/2014 0948   HDL 68.70 08/06/2014 0948  CHOLHDL 2 08/06/2014 0948   VLDL 17.4 08/06/2014 0948   LDLCALC 77 08/06/2014 0948   LDLDIRECT 101.1 10/21/2010 0954    Additional studies/ records that were reviewed today include:  Prior office notes reviewed, lab work reviewed, x-ray reviewed    ASSESSMENT:    1. Chest pain, unspecified chest pain type   2. Thrombotic stroke involving right cerebellar artery ([place])   3. Benign hypertensive heart disease without heart failure   4. Hypercholesterolemia   5. Parkinson's disease ([place])      PLAN:  In order of problems listed above:  Chest pain  - Responsive to sublingual nitroglycerin, took 3 times in 20 years.   - No prior objective evidence of ischemic heart disease  Statin intolerance/hyperlipidemia  - Trial of pravastatin, Seems to be doing well  Peripheral neuropathy  - Feet, no longer driving, neurology following  History of thrombotic stroke, right brain  - Slurred speech  - Plavix  Parkinson's  - PT 2 times week. Home help.  - Veneda Melter, [facility] in [place] - daughters  - [place] right shoulder turning to get perfume  Medication Adjustments/Labs and Tests Ordered: Current medicines are reviewed at length with the patient today.  Concerns regarding medicines are outlined above.  Medication changes, Labs and Tests ordered today are listed in the Patient Instructions below. Patient Instructions  Medication Instructions:   The current medical regimen is effective;  continue present plan and medications.  Follow-Up: Follow up in 6 months with Dr. Marlou Porch.  You will receive a letter in the mail 2 months before you are due.  Please call us when you receive this letter to schedule your follow up appointment.  If you need a refill on your cardiac medications before your next appointment, please call your pharmacy.  Thank you for choosing [facility]!!          Signed, Candee Furbish, MD  08/18/2015 3:02 PM    [facility] [address], [place], [place]  [address] Phone: [phone]; Fax: [phone]

## 2015-08-19 ENCOUNTER — Other Ambulatory Visit: Payer: Self-pay | Admitting: Internal Medicine

## 2015-08-19 DIAGNOSIS — M47817 Spondylosis without myelopathy or radiculopathy, lumbosacral region: Secondary | ICD-10-CM | POA: Diagnosis not present

## 2015-08-19 DIAGNOSIS — N390 Urinary tract infection, site not specified: Secondary | ICD-10-CM | POA: Diagnosis not present

## 2015-08-19 DIAGNOSIS — M170 Bilateral primary osteoarthritis of knee: Secondary | ICD-10-CM | POA: Diagnosis not present

## 2015-08-19 DIAGNOSIS — I69354 Hemiplegia and hemiparesis following cerebral infarction affecting left non-dominant side: Secondary | ICD-10-CM | POA: Diagnosis not present

## 2015-08-19 DIAGNOSIS — M6281 Muscle weakness (generalized): Secondary | ICD-10-CM | POA: Diagnosis not present

## 2015-08-19 DIAGNOSIS — F419 Anxiety disorder, unspecified: Secondary | ICD-10-CM | POA: Diagnosis not present

## 2015-08-20 ENCOUNTER — Ambulatory Visit (INDEPENDENT_AMBULATORY_CARE_PROVIDER_SITE_OTHER): Payer: Medicare Other | Admitting: Family Medicine

## 2015-08-20 ENCOUNTER — Encounter: Payer: Self-pay | Admitting: Family Medicine

## 2015-08-20 ENCOUNTER — Other Ambulatory Visit: Payer: Self-pay

## 2015-08-20 VITALS — BP 112/68 | Ht 60.0 in | Wt 165.0 lb

## 2015-08-20 DIAGNOSIS — E538 Deficiency of other specified B group vitamins: Secondary | ICD-10-CM | POA: Diagnosis not present

## 2015-08-20 DIAGNOSIS — I63341 Cerebral infarction due to thrombosis of right cerebellar artery: Secondary | ICD-10-CM | POA: Diagnosis not present

## 2015-08-20 DIAGNOSIS — R269 Unspecified abnormalities of gait and mobility: Secondary | ICD-10-CM | POA: Diagnosis not present

## 2015-08-20 DIAGNOSIS — M4698 Unspecified inflammatory spondylopathy, sacral and sacrococcygeal region: Secondary | ICD-10-CM

## 2015-08-20 DIAGNOSIS — M170 Bilateral primary osteoarthritis of knee: Secondary | ICD-10-CM

## 2015-08-20 DIAGNOSIS — M47818 Spondylosis without myelopathy or radiculopathy, sacral and sacrococcygeal region: Secondary | ICD-10-CM

## 2015-08-20 MED ORDER — CYANOCOBALAMIN 1000 MCG/ML IJ SOLN
1000.0000 ug | Freq: Once | INTRAMUSCULAR | Status: AC
  Administered 2015-08-20: 1000 ug via INTRAMUSCULAR

## 2015-08-20 NOTE — Patient Instructions (Addendum)
I am sorry on the shoulder We injected the right knee and SI joint today  Ice the shoulder after PT and before be for 10 minutes aa a time Arnica lotion  To the shoulder 2 times daily  See me again in 3 weeks if the shoulder is not better.

## 2015-08-20 NOTE — Progress Notes (Signed)
Pre visit review using our clinic review tool, if applicable. No additional management support is needed unless otherwise documented below in the visit note. 

## 2015-08-20 NOTE — Assessment & Plan Note (Signed)
Patient was given an injection today in the right knee. Tolerated the procedure well. We discussed icing regimen. Discussed continuing the over-the-counter medications. Patient declined a brace. Continue to use a walker.

## 2015-08-20 NOTE — Assessment & Plan Note (Signed)
Continue with walker 

## 2015-08-20 NOTE — Assessment & Plan Note (Signed)
Given an injection today and tolerated the procedure well. We discussed icing regimen and home exercises. We discussed continuing topical anti-inflammatories if it is beneficial. I do believe that patient's Parkinson's is worsening is likely going to make this very complicated progression.

## 2015-08-20 NOTE — Progress Notes (Signed)
CC: Back pain followup  HPI: Patient is a very pleasant 82 year old female coming in with chronic back pain. Was seen one month ago and given a sacroiliac injection. Been on an weeks since her last injection. Patient is having severe pain. Parkinson's disease is getting worse. Patient's caregiver states that she's been getting somewhat worse over the course of time.  Complaining of right knee pain. This is new for her. Patient did have left knee at last exam and was given an injection. Feels very similar to the same problem. Would like a injection in this knee as possible.  Previous imaging studies XR lumbar spine 06/07/2012: Findings: There is curvature convex to the left. There is advanced degenerative disc disease at T12-L1, L1-2 and L4-5. The L5-S1 level is transitional. There is anterolisthesis of 3 mm of L4-5.  There is retrolisthesis of 3 mm at T12-L1 and L1-2. These degenerative changes have worsened since 2010. No evidence of fracture.  IMPRESSION:  Curvature convex to the left. Worsening of degenerative disc disease at T12-L1, L1-2 and L4-5.   Past medical, surgical, family and social history reviewed. Medications reviewed all in the electronic medical record.  Review of Systems: No headache, visual changes, nausea, vomiting, diarrhea, constipation, dizziness, abdominal pain, skin rash, fevers, chills, night sweats, weight loss, swollen lymph nodes, body aches, joint swelling, muscle aches, chest pain, shortness of breath, mood changes.   Objective:    Blood pressure 112/68, height 5' (1.524 m), weight 165 lb (74.844 kg).   General: No apparent distress alert and oriented x3 mood and affect normal, dressed appropriately. Masked faces worse than previous exam HEENT: Pupils equal, extraocular movements intact Respiratory: Patient's speak in full sentences and does not appear short of breath Cardiovascular: 2+ pitting edema to mid thigh bilaterally Skin: Warm dry intact with no signs of  infection or rash on extremities or on axial skeleton. Abdomen: Soft nontender Neuro: Cranial nerves II through XII are intact, neurovascularly intact in all extremities with 2+ DTRs and 2+ pulses. Lymph: No lymphadenopathy of posterior or anterior cervical chain or axillae bilaterally.  Gait walks with a walker and shuffling gait MSK: Non tender with full range of motion and good stability and symmetric strength and tone of shoulders, elbows, wrist, hip, and ankles bilaterally. Moderate loss of range of motion of multiple joints secondary to osteoporosis.  Knee: Right Normal to inspection with no erythema or effusion or obvious bony abnormalities. Medial joint line tenderness as well as some lateral joint line tenderness. ROM full in flexion and extension and lower leg rotation.patient does have some rigidity noted significant for Parkinson's. Ligaments with solid consistent endpoints including ACL, PCL, LCL, MCL. Negative Mcmurray's, Apley's, and Thessalonian tests. Non painful patellar compression. Patellar glide mildcrepitus. Patellar and quadriceps tendons unremarkable. Hamstring and quadriceps strength is normal.  Contralateral knee mild tenderness to palpation.  Back Exam:  Inspection:increase kyphosis as well as some scoliosis Motion: Flexion 35 deg, Extension 25 deg, Side Bending to 25 deg bilaterally,  Rotation to 25 deg bilaterally  SLR laying: pain but no radiation  XSLR laying: Negative  Palpable tenderness: Less tender over the sacroiliac joint FABER: unable for patient to participate. Sensory change: Gross sensation intact to all lumbar and sacral dermatomes.  Reflexes: 2+ at both patellar tendons, 2+ at achilles tendons, Babinski's downgoing.  Strength at foot  Plantar-flexion: 5/5 Dorsi-flexion: 5/5 Eversion: 5/5 Inversion: 5/5  Leg strength  Quad: 4/5 Hamstring: 4/5 Hip flexor: 4/5 Hip abductors: 3/5  But symmetric to contralateral  side.    Procedure: Real-time  Ultrasound Guided Injection of right sacroiliac joint Device: GE Logiq E  Ultrasound guided injection is preferred based studies that show increased duration, increased effect, greater accuracy, decreased procedural pain, increased response rate, and decreased cost with ultrasound guided versus blind injection.  Verbal informed consent obtained.  Time-out conducted.  Noted no overlying erythema, induration, or other signs of local infection.  Skin prepped in a sterile fashion.  Local anesthesia: Topical Ethyl chloride.  With sterile technique and under real time ultrasound guidance:  With a 21-gauge 2 inch no patient was injected with a total of 0.5 mL of 0.5% Marcaine and 0.5 mL of Kenalog 40 mg/deciliter. Completed without difficulty  Pain immediately resolved suggesting accurate placement of the medication.  Advised to call if fevers/chills, erythema, induration, drainage, or persistent bleeding.  Images permanently stored and available for review in the ultrasound unit.  Impression: Technically successful ultrasound guided injection.  After informed written and verbal consent, patient was seated on exam table. Right knee was prepped with alcohol swab and utilizing anterolateral approach, patient's right knee space was injected with 4:1  marcaine 0.5%: Kenalog 40mg /dL. Patient tolerated the procedure well without immediate complications.  Impression and Recommendations:     This case required medical decision making of moderate complexity.

## 2015-08-22 DIAGNOSIS — M170 Bilateral primary osteoarthritis of knee: Secondary | ICD-10-CM | POA: Diagnosis not present

## 2015-08-22 DIAGNOSIS — M47817 Spondylosis without myelopathy or radiculopathy, lumbosacral region: Secondary | ICD-10-CM | POA: Diagnosis not present

## 2015-08-22 DIAGNOSIS — M6281 Muscle weakness (generalized): Secondary | ICD-10-CM | POA: Diagnosis not present

## 2015-08-22 DIAGNOSIS — N390 Urinary tract infection, site not specified: Secondary | ICD-10-CM | POA: Diagnosis not present

## 2015-08-22 DIAGNOSIS — F419 Anxiety disorder, unspecified: Secondary | ICD-10-CM | POA: Diagnosis not present

## 2015-08-22 DIAGNOSIS — I69354 Hemiplegia and hemiparesis following cerebral infarction affecting left non-dominant side: Secondary | ICD-10-CM | POA: Diagnosis not present

## 2015-08-25 DIAGNOSIS — M47817 Spondylosis without myelopathy or radiculopathy, lumbosacral region: Secondary | ICD-10-CM | POA: Diagnosis not present

## 2015-08-25 DIAGNOSIS — N390 Urinary tract infection, site not specified: Secondary | ICD-10-CM | POA: Diagnosis not present

## 2015-08-25 DIAGNOSIS — I69354 Hemiplegia and hemiparesis following cerebral infarction affecting left non-dominant side: Secondary | ICD-10-CM | POA: Diagnosis not present

## 2015-08-25 DIAGNOSIS — M170 Bilateral primary osteoarthritis of knee: Secondary | ICD-10-CM | POA: Diagnosis not present

## 2015-08-25 DIAGNOSIS — F419 Anxiety disorder, unspecified: Secondary | ICD-10-CM | POA: Diagnosis not present

## 2015-08-25 DIAGNOSIS — M6281 Muscle weakness (generalized): Secondary | ICD-10-CM | POA: Diagnosis not present

## 2015-08-26 DIAGNOSIS — H2513 Age-related nuclear cataract, bilateral: Secondary | ICD-10-CM | POA: Diagnosis not present

## 2015-08-26 DIAGNOSIS — H40013 Open angle with borderline findings, low risk, bilateral: Secondary | ICD-10-CM | POA: Diagnosis not present

## 2015-08-26 DIAGNOSIS — H25013 Cortical age-related cataract, bilateral: Secondary | ICD-10-CM | POA: Diagnosis not present

## 2015-08-26 DIAGNOSIS — I639 Cerebral infarction, unspecified: Secondary | ICD-10-CM | POA: Diagnosis not present

## 2015-08-29 DIAGNOSIS — M47817 Spondylosis without myelopathy or radiculopathy, lumbosacral region: Secondary | ICD-10-CM | POA: Diagnosis not present

## 2015-08-29 DIAGNOSIS — M6281 Muscle weakness (generalized): Secondary | ICD-10-CM | POA: Diagnosis not present

## 2015-08-29 DIAGNOSIS — I69354 Hemiplegia and hemiparesis following cerebral infarction affecting left non-dominant side: Secondary | ICD-10-CM | POA: Diagnosis not present

## 2015-08-29 DIAGNOSIS — N390 Urinary tract infection, site not specified: Secondary | ICD-10-CM | POA: Diagnosis not present

## 2015-08-29 DIAGNOSIS — F419 Anxiety disorder, unspecified: Secondary | ICD-10-CM | POA: Diagnosis not present

## 2015-08-29 DIAGNOSIS — M170 Bilateral primary osteoarthritis of knee: Secondary | ICD-10-CM | POA: Diagnosis not present

## 2015-08-30 DIAGNOSIS — I872 Venous insufficiency (chronic) (peripheral): Secondary | ICD-10-CM | POA: Diagnosis not present

## 2015-08-30 DIAGNOSIS — M6281 Muscle weakness (generalized): Secondary | ICD-10-CM | POA: Diagnosis not present

## 2015-08-30 DIAGNOSIS — Z9181 History of falling: Secondary | ICD-10-CM | POA: Diagnosis not present

## 2015-08-30 DIAGNOSIS — M47817 Spondylosis without myelopathy or radiculopathy, lumbosacral region: Secondary | ICD-10-CM | POA: Diagnosis not present

## 2015-08-30 DIAGNOSIS — I10 Essential (primary) hypertension: Secondary | ICD-10-CM | POA: Diagnosis not present

## 2015-08-30 DIAGNOSIS — G629 Polyneuropathy, unspecified: Secondary | ICD-10-CM | POA: Diagnosis not present

## 2015-08-30 DIAGNOSIS — Z7902 Long term (current) use of antithrombotics/antiplatelets: Secondary | ICD-10-CM | POA: Diagnosis not present

## 2015-08-30 DIAGNOSIS — I69354 Hemiplegia and hemiparesis following cerebral infarction affecting left non-dominant side: Secondary | ICD-10-CM | POA: Diagnosis not present

## 2015-08-30 DIAGNOSIS — I25119 Atherosclerotic heart disease of native coronary artery with unspecified angina pectoris: Secondary | ICD-10-CM | POA: Diagnosis not present

## 2015-08-30 DIAGNOSIS — M170 Bilateral primary osteoarthritis of knee: Secondary | ICD-10-CM | POA: Diagnosis not present

## 2015-08-30 DIAGNOSIS — G20 Parkinson's disease: Secondary | ICD-10-CM | POA: Diagnosis not present

## 2015-08-30 DIAGNOSIS — F329 Major depressive disorder, single episode, unspecified: Secondary | ICD-10-CM | POA: Diagnosis not present

## 2015-08-30 DIAGNOSIS — F419 Anxiety disorder, unspecified: Secondary | ICD-10-CM | POA: Diagnosis not present

## 2015-09-01 DIAGNOSIS — M47817 Spondylosis without myelopathy or radiculopathy, lumbosacral region: Secondary | ICD-10-CM | POA: Diagnosis not present

## 2015-09-01 DIAGNOSIS — I69354 Hemiplegia and hemiparesis following cerebral infarction affecting left non-dominant side: Secondary | ICD-10-CM | POA: Diagnosis not present

## 2015-09-01 DIAGNOSIS — M170 Bilateral primary osteoarthritis of knee: Secondary | ICD-10-CM | POA: Diagnosis not present

## 2015-09-01 DIAGNOSIS — I25119 Atherosclerotic heart disease of native coronary artery with unspecified angina pectoris: Secondary | ICD-10-CM | POA: Diagnosis not present

## 2015-09-01 DIAGNOSIS — G20 Parkinson's disease: Secondary | ICD-10-CM | POA: Diagnosis not present

## 2015-09-01 DIAGNOSIS — F329 Major depressive disorder, single episode, unspecified: Secondary | ICD-10-CM | POA: Diagnosis not present

## 2015-09-03 DIAGNOSIS — I69354 Hemiplegia and hemiparesis following cerebral infarction affecting left non-dominant side: Secondary | ICD-10-CM | POA: Diagnosis not present

## 2015-09-03 DIAGNOSIS — G20 Parkinson's disease: Secondary | ICD-10-CM | POA: Diagnosis not present

## 2015-09-03 DIAGNOSIS — M170 Bilateral primary osteoarthritis of knee: Secondary | ICD-10-CM | POA: Diagnosis not present

## 2015-09-03 DIAGNOSIS — I25119 Atherosclerotic heart disease of native coronary artery with unspecified angina pectoris: Secondary | ICD-10-CM | POA: Diagnosis not present

## 2015-09-03 DIAGNOSIS — F329 Major depressive disorder, single episode, unspecified: Secondary | ICD-10-CM | POA: Diagnosis not present

## 2015-09-03 DIAGNOSIS — M47817 Spondylosis without myelopathy or radiculopathy, lumbosacral region: Secondary | ICD-10-CM | POA: Diagnosis not present

## 2015-09-05 ENCOUNTER — Other Ambulatory Visit: Payer: Self-pay | Admitting: Internal Medicine

## 2015-09-05 NOTE — Telephone Encounter (Signed)
Faxed to pharmacy

## 2015-09-08 ENCOUNTER — Ambulatory Visit: Payer: Medicare Other | Admitting: Neurology

## 2015-09-08 DIAGNOSIS — G20 Parkinson's disease: Secondary | ICD-10-CM | POA: Diagnosis not present

## 2015-09-08 DIAGNOSIS — I69354 Hemiplegia and hemiparesis following cerebral infarction affecting left non-dominant side: Secondary | ICD-10-CM | POA: Diagnosis not present

## 2015-09-08 DIAGNOSIS — F329 Major depressive disorder, single episode, unspecified: Secondary | ICD-10-CM | POA: Diagnosis not present

## 2015-09-08 DIAGNOSIS — I25119 Atherosclerotic heart disease of native coronary artery with unspecified angina pectoris: Secondary | ICD-10-CM | POA: Diagnosis not present

## 2015-09-08 DIAGNOSIS — M47817 Spondylosis without myelopathy or radiculopathy, lumbosacral region: Secondary | ICD-10-CM | POA: Diagnosis not present

## 2015-09-08 DIAGNOSIS — M170 Bilateral primary osteoarthritis of knee: Secondary | ICD-10-CM | POA: Diagnosis not present

## 2015-09-10 ENCOUNTER — Telehealth: Payer: Self-pay | Admitting: Neurology

## 2015-09-10 NOTE — Telephone Encounter (Signed)
Patient would like to know if there is anything else she can do.  She started taking whole tablets again so I suggested she try the way she was taking it for a while.  1/2 tablet then the other 1/2 an hour later since this helped last time.

## 2015-09-10 NOTE — Telephone Encounter (Signed)
Please call the patient back, the meds for PD is making her sick. She needs to know what to do.  [phone]

## 2015-09-11 NOTE — Telephone Encounter (Signed)
Have her take 1/2 tablet three times daily with meals.

## 2015-09-11 NOTE — Telephone Encounter (Signed)
Sarah [name] and Sarah [name] were both given instructions and agreed with plan.

## 2015-09-12 DIAGNOSIS — M47817 Spondylosis without myelopathy or radiculopathy, lumbosacral region: Secondary | ICD-10-CM | POA: Diagnosis not present

## 2015-09-12 DIAGNOSIS — F329 Major depressive disorder, single episode, unspecified: Secondary | ICD-10-CM | POA: Diagnosis not present

## 2015-09-12 DIAGNOSIS — I69354 Hemiplegia and hemiparesis following cerebral infarction affecting left non-dominant side: Secondary | ICD-10-CM | POA: Diagnosis not present

## 2015-09-12 DIAGNOSIS — M170 Bilateral primary osteoarthritis of knee: Secondary | ICD-10-CM | POA: Diagnosis not present

## 2015-09-12 DIAGNOSIS — G20 Parkinson's disease: Secondary | ICD-10-CM | POA: Diagnosis not present

## 2015-09-12 DIAGNOSIS — I25119 Atherosclerotic heart disease of native coronary artery with unspecified angina pectoris: Secondary | ICD-10-CM | POA: Diagnosis not present

## 2015-09-15 DIAGNOSIS — F329 Major depressive disorder, single episode, unspecified: Secondary | ICD-10-CM | POA: Diagnosis not present

## 2015-09-15 DIAGNOSIS — M47817 Spondylosis without myelopathy or radiculopathy, lumbosacral region: Secondary | ICD-10-CM | POA: Diagnosis not present

## 2015-09-15 DIAGNOSIS — M170 Bilateral primary osteoarthritis of knee: Secondary | ICD-10-CM | POA: Diagnosis not present

## 2015-09-15 DIAGNOSIS — G20 Parkinson's disease: Secondary | ICD-10-CM | POA: Diagnosis not present

## 2015-09-15 DIAGNOSIS — I25119 Atherosclerotic heart disease of native coronary artery with unspecified angina pectoris: Secondary | ICD-10-CM | POA: Diagnosis not present

## 2015-09-15 DIAGNOSIS — I69354 Hemiplegia and hemiparesis following cerebral infarction affecting left non-dominant side: Secondary | ICD-10-CM | POA: Diagnosis not present

## 2015-09-19 DIAGNOSIS — G20 Parkinson's disease: Secondary | ICD-10-CM | POA: Diagnosis not present

## 2015-09-19 DIAGNOSIS — M47817 Spondylosis without myelopathy or radiculopathy, lumbosacral region: Secondary | ICD-10-CM | POA: Diagnosis not present

## 2015-09-19 DIAGNOSIS — M170 Bilateral primary osteoarthritis of knee: Secondary | ICD-10-CM | POA: Diagnosis not present

## 2015-09-19 DIAGNOSIS — F329 Major depressive disorder, single episode, unspecified: Secondary | ICD-10-CM | POA: Diagnosis not present

## 2015-09-19 DIAGNOSIS — I69354 Hemiplegia and hemiparesis following cerebral infarction affecting left non-dominant side: Secondary | ICD-10-CM | POA: Diagnosis not present

## 2015-09-19 DIAGNOSIS — I25119 Atherosclerotic heart disease of native coronary artery with unspecified angina pectoris: Secondary | ICD-10-CM | POA: Diagnosis not present

## 2015-09-23 DIAGNOSIS — F329 Major depressive disorder, single episode, unspecified: Secondary | ICD-10-CM | POA: Diagnosis not present

## 2015-09-23 DIAGNOSIS — M170 Bilateral primary osteoarthritis of knee: Secondary | ICD-10-CM | POA: Diagnosis not present

## 2015-09-23 DIAGNOSIS — G20 Parkinson's disease: Secondary | ICD-10-CM | POA: Diagnosis not present

## 2015-09-23 DIAGNOSIS — M47817 Spondylosis without myelopathy or radiculopathy, lumbosacral region: Secondary | ICD-10-CM | POA: Diagnosis not present

## 2015-09-23 DIAGNOSIS — I69354 Hemiplegia and hemiparesis following cerebral infarction affecting left non-dominant side: Secondary | ICD-10-CM | POA: Diagnosis not present

## 2015-09-23 DIAGNOSIS — I25119 Atherosclerotic heart disease of native coronary artery with unspecified angina pectoris: Secondary | ICD-10-CM | POA: Diagnosis not present

## 2015-09-24 ENCOUNTER — Telehealth: Payer: Self-pay

## 2015-09-24 NOTE — Telephone Encounter (Signed)
Home Health Cert/Plan of Care received (08/30/2015 - 10/28/2015) and placed on MD's desk for signature  [facility] request call back once faxed Phone [phone] Fax [phone]

## 2015-09-26 ENCOUNTER — Encounter: Payer: Self-pay | Admitting: Neurology

## 2015-09-26 ENCOUNTER — Ambulatory Visit (INDEPENDENT_AMBULATORY_CARE_PROVIDER_SITE_OTHER): Payer: Medicare Other | Admitting: Neurology

## 2015-09-26 VITALS — BP 110/70 | HR 92 | Ht 60.0 in | Wt 156.0 lb

## 2015-09-26 DIAGNOSIS — G20 Parkinson's disease: Secondary | ICD-10-CM | POA: Diagnosis not present

## 2015-09-26 DIAGNOSIS — I63341 Cerebral infarction due to thrombosis of right cerebellar artery: Secondary | ICD-10-CM

## 2015-09-26 DIAGNOSIS — E538 Deficiency of other specified B group vitamins: Secondary | ICD-10-CM

## 2015-09-26 MED ORDER — CYANOCOBALAMIN 1000 MCG/ML IJ SOLN
1000.0000 ug | Freq: Once | INTRAMUSCULAR | Status: AC
  Administered 2015-09-26: 1000 ug via INTRAMUSCULAR

## 2015-09-26 NOTE — Patient Instructions (Signed)
Check vitamin B12 level Try to increase your sinemet to 1 tablet three times daily after meal Keep up the good work with your exercises  Return to clinic in 3 months

## 2015-09-26 NOTE — Progress Notes (Signed)
[facility]  Follow-up Visit   Date: 09/26/2015   Sarah [name] MRN: [id number] DOB: 1927/02/07   Interim History: Sarah [name] is a 82 y.o. right-handed Caucasian female with history of HTN, HPL, depression, esophageal strictures, and GERD returning to the clinic for follow-up of Parkinson's disease and gait instability.  The patient was accompanied to the clinic by her caregiver, [name].  History of present illness: Leg weakness started slowly around May 2014 when she noticed difficulty with walking and tripping on things. She would stumble frequently, no falls.  She has associated bilateral achy knee pain and chronic back pain but had worsening pain in August 2014. Pain is achy located at the low back and does not radiate into her buttocks or legs. Forward flexion makes her pain worse and it is improved by laying down. EMG was performed and shows moderate peripheral neuropathy of the legs and L3-L4 old radiculopathy.   Follow-up 10/09/2013:  She had an unwittnessed fall on 6/17 while transferring a few things to her dining table.  She hit the back of her head and bruised her right wrist.  Caregiver says that she is more forgetful than usual, especially with conversation.  Since her fall, she has had intermittent headaches, described as achy, occuring daily and last all day. It usually responds to tylenol.    UPDATE 09/02/2014:  Patient had an interval hospitalization for acute stroke manifested with slurred speech which has since resolved.  She called for same-day appointment because of severe bifrontal headache since last week.  She went to the ED 6/2 where tramadol was given and provided relief. She is getting home PT and feels that she may have over exerted herself lately which could have triggered her migraine.    UPDATE 09/20/2014:  She is so relieved that her headaches improved with prednisone.  She is doing well and has not had any further headaches, slurred  speech, or falls.    UPDATE 12/27/2014:   She started having headaches several weeks ago which are bifrontal and throbbing. She went to urgent care on 9/26 where she was givne prednisone and amoxicillin for possible sinusitis. She tried excedrin migraine did not provide any relief.  No worsening of headache when laying flat, with sneezing/coughing, and she denies any vision changes.  Headache is similar to what she has experienced in the past. She was give tizanidine, but did not tolerate this.  Neck PT helped.   UPDATE 05/05/2015:  She is complaining of joint pain, especially in the knees and leg swelling.  Her headaches are much improved since she started taking ginger supplements.  She denies painful paresthesias of the feet, but does feel like her balance is becoming worse.  Fortunately, there have been no interval falls.  She continues to use a rollator.    UPDATE 07/01/2015:  Patient called for urgent visit due to worsening headaches and gait instability.  She arrived 15 minutes late to her appointment.  About 3-4 weeks ago, she began having rhinitis, head congestion, and headaches. [name] suggested this may be due to allergies, which she often gets, so started taking claritin and flonase which does provide relief.  Patient also has been taking tylenol or excedrin daily for the past month.    She reports having a stumble a few weeks ago, but her life alert did not work.  Fortunately, she was outside trying to pick up a piece of paper on the floor and some neighbors helped her up.  She is very depressed and lacks the motivation and interest to engage in physical therapy or other activities she usually would enjoy.  She is worried that she will be unable to attend her granddaughter's wedding in [place].  Her memory has been much worse lately and she often forgets what she is saying in mid conversation.   UPDATE 09/26/2015:  She reports having a slow gradual decline in overall function.  She is doing  less home PT and feels movements are slower and she is less able than before. She notices a marked difference in her walking when she is taking sinemet, but still is not able to tolerate a whole pill due to nausea.  Mood has been better and she no longer has frequent headaches.  Medications:  Current Outpatient Prescriptions on File Prior to Visit  Medication Sig Dispense Refill  . acetaminophen (TYLENOL) 500 MG tablet Take 1 tablet (500 mg total) by mouth every 8 (eight) hours as needed (for pain). 180 tablet 2  . ALPRAZolam (XANAX) 1 MG tablet TAKE 1 TABLET TWICE DAILY. 60 tablet 2  . carbidopa-levodopa (SINEMET IR) 25-100 MG tablet Take 1 tablet by mouth 3 (three) times daily. 90 tablet 5  . Cholecalciferol (VITAMIN D PO) Take 1 tablet by mouth daily.     . clobetasol ointment (TEMOVATE) [id number] % Apply 1 application topically 2 (two) times daily.     . clopidogrel (PLAVIX) 75 MG tablet TAKE 1 TABLET ONCE DAILY. 30 tablet 6  . diclofenac sodium (VOLTAREN) 1 % GEL Apply  To feet 3 times daily prn for pain 1 Tube 5  . furosemide (LASIX) 20 MG tablet TAKE 1 TABLET DAILY AS NEEDED. 30 tablet 0  . Glucosamine HCl 750 MG TABS Take 1 tablet by mouth 2 (two) times daily.     Marland Kitchen HALCION 0.25 MG tablet TAKE 1/2 TABLET AT BEDTIME AS NEEDED FOR SLEEP. 45 tablet 0  . Misc Natural Products (TUMERSAID) TABS Take 1 capsule by mouth 2 (two) times daily. 60 tablet 3  . nitroGLYCERIN (NITROSTAT) 0.4 MG SL tablet Place 1 tablet (0.4 mg total) under the tongue every 5 (five) minutes as needed for chest pain. 50 tablet prn  . nortriptyline (PAMELOR) 25 MG capsule TAKE 1 CAPSULE AT BEDTIME. 90 capsule 3  . pantoprazole (PROTONIX) 40 MG tablet TAKE 1 TABLET ONCE DAILY. 30 tablet 11  . perphenazine (TRILAFON) 2 MG tablet TAKE ONE TABLET AT BEDTIME. 30 tablet 3  . pravastatin (PRAVACHOL) 20 MG tablet TAKE (1/2) TABLET DAILY. 45 tablet 0  . solifenacin (VESICARE) 10 MG tablet Take 10 mg by mouth daily as needed (to reduce  number of bathroom visits during travel).    Marland Kitchen sulfamethoxazole-trimethoprim (BACTRIM DS,SEPTRA DS) 800-160 MG tablet Take 1 tablet by mouth daily.   0   Current Facility-Administered Medications on File Prior to Visit  Medication Dose Route Frequency Provider Last Rate Last Dose  . cyanocobalamin ((VITAMIN B-12)) injection 1,000 mcg  1,000 mcg Intramuscular Q30 days Binnie Rail, MD   1,000 mcg at 06/27/15 1545    Allergies:  Allergies  Allergen Reactions  . Celebrex [Celecoxib]     rash  . Lipitor [Atorvastatin Calcium]     Leg weakness,memory loss  . Lovastatin     Leg weakness  . Macrobid [employer] Macro]     Unknown per patient  . Other     Always pads  . Latex Rash     Review of Systems:  CONSTITUTIONAL: No  fevers, chills, night sweats, or weight loss.   EYES: No visual changes or eye pain ENT: No hearing changes.  No history of nose bleeds. RESPIRATORY: No cough, wheezing and shortness of breath.   CARDIOVASCULAR: Negative for chest pain, and palpitations.   GI: Negative for abdominal discomfort, blood in stools or black stools.  No recent change in bowel habits.  GU:  No history of incontinence.   MUSCLOSKELETAL: +history of joint pain or swelling.  +myalgias.  + back pain SKIN: Negative for lesions, rash, and itching.   HEMATOLOGY/ONCOLOGY: Negative for prolonged bleeding, bruising easily, and swollen nodes.   ENDOCRINE: Negative for cold or heat intolerance, polydipsia or goiter.   PSYCH:  +depression or anxiety symptoms.   NEURO: As Above.   Vital Signs:  BP 110/70 mmHg  Pulse 92  Ht 5' (1.524 m)  Wt 156 lb (70.761 kg)  BMI 30.47 kg/m2  SpO2 99%  Neurological Exam: MENTAL STATUS including orientation to time, place, person, recent and remote memory is fairly intact. She easily get distracted and forgets her thoughts.  Severely blunted affect.   CRANIAL NERVES:  Pupils round and reactive to light. Extraocular muscles are intact. Face is  symmetric. Palate elevates symmetrically and tongue is midline.    MOTOR:  Motor strength is 5/5 in proximal muscles. Tone is mildly increased in the RUE.  There is very mild resting hand tremor on the right (new)  SENSORY:  Absent vibration distal to ankles.  COORDINATION/GAIT:  Slowed movement throughout, including finger tapping and heel tapping.  Slow, shuffling, assisted with rollator.  Data: EMG 01/15/2013:  Taken together, these findings are consistent with a moderate generalized, large fiber, sensorimotor polyneuropathy affecting the left side. There is a superimposed intraspinal canal lesion (i.e. radiculopathy) affecting the L2-L4 nerve root/segments bilaterally, moderate in degree electrically.  CT brain 10/15/2013: No skull fracture or intracranial hemorrhage. Fluid level within the right maxillary sinus may be related to sinus  disease without secondary findings of orbital injury. If orbital injury were of clinical concern, CT orbits recommended for further  delineation. Prominent small vessel disease type changes without CT evidence of large acute infarct.  Mild global atrophy without hydrocephalus.  MRI/A Brain 07/03/2014: 1. Acute nonhemorrhagic 21 mm white matter infarct of the posterior right frontal lobe. 2. Extensive atrophy and diffuse white matter disease corresponds to advanced chronic microvascular ischemia. 3. Moderate to high-grade stenosis of the infra posterior right M2 branch. 4. Moderate small vessel disease is evident on the MRA. 5. High-grade stenosis in the proximal left P2 segment.  US carotids 07/04/2014:  1-39% ICA stenosis bilaterally TTE 07/04/2014:  Within normal limits, no thrombus   IMPRESSION/PLAN:   1.  Parkinsonism, REM behavior disorder  Exam shows blunted affect, slightly slowed movements, stooped posture with shuffling gait, mild resting right hand tremor  - Increase sinemet 25/100 1 tab TID, take with meals to see if this helps with nausea  -  Fall precautions discussed  2. Medication overuse headaches - improved  - Limit tylenol and Excedrin to twice per week  3.  Peripheral neuropathy and gait instability   - Continue nortriptyline 25mg  qhs which controls her painful paresthesias, avoid increasing the dose due to sedation   - She has a prescription for a wheelchair to use for long distances  4.  Vitamin B12 deficiency  - OK to get vitamin B12 injection at our office today, but she will continue to get this from her PCP  5.  Senile  dementia  6.  Advanced age  - We discussed goals of care moving forward and she has already set up POA, Advanced Directives (DNR), and Living Will  Return to clinic in 72-months   The duration of this appointment visit was 30 minutes of face-to-face time with the patient.  Greater than 50% of this time was spent in counseling, explanation of diagnosis, planning of further management, and coordination of care.   Thank you for allowing me to participate in patient's care.  If I can answer any additional questions, I would be pleased to do so.    Sincerely,    Donika K. Posey Pronto, DO

## 2015-09-29 DIAGNOSIS — I69354 Hemiplegia and hemiparesis following cerebral infarction affecting left non-dominant side: Secondary | ICD-10-CM | POA: Diagnosis not present

## 2015-09-29 DIAGNOSIS — G20 Parkinson's disease: Secondary | ICD-10-CM | POA: Diagnosis not present

## 2015-09-29 DIAGNOSIS — M47817 Spondylosis without myelopathy or radiculopathy, lumbosacral region: Secondary | ICD-10-CM | POA: Diagnosis not present

## 2015-09-29 DIAGNOSIS — I25119 Atherosclerotic heart disease of native coronary artery with unspecified angina pectoris: Secondary | ICD-10-CM | POA: Diagnosis not present

## 2015-09-29 DIAGNOSIS — F329 Major depressive disorder, single episode, unspecified: Secondary | ICD-10-CM | POA: Diagnosis not present

## 2015-09-29 DIAGNOSIS — M170 Bilateral primary osteoarthritis of knee: Secondary | ICD-10-CM | POA: Diagnosis not present

## 2015-10-01 NOTE — Telephone Encounter (Signed)
Paperwork signed, faxed, copy sent to scan by Amy, Dr Nathanial Millman assistant

## 2015-10-03 ENCOUNTER — Ambulatory Visit: Payer: Medicare Other | Admitting: Family Medicine

## 2015-10-07 DIAGNOSIS — F329 Major depressive disorder, single episode, unspecified: Secondary | ICD-10-CM | POA: Diagnosis not present

## 2015-10-07 DIAGNOSIS — G20 Parkinson's disease: Secondary | ICD-10-CM | POA: Diagnosis not present

## 2015-10-07 DIAGNOSIS — I69354 Hemiplegia and hemiparesis following cerebral infarction affecting left non-dominant side: Secondary | ICD-10-CM | POA: Diagnosis not present

## 2015-10-07 DIAGNOSIS — M47817 Spondylosis without myelopathy or radiculopathy, lumbosacral region: Secondary | ICD-10-CM | POA: Diagnosis not present

## 2015-10-07 DIAGNOSIS — M170 Bilateral primary osteoarthritis of knee: Secondary | ICD-10-CM | POA: Diagnosis not present

## 2015-10-07 DIAGNOSIS — I25119 Atherosclerotic heart disease of native coronary artery with unspecified angina pectoris: Secondary | ICD-10-CM | POA: Diagnosis not present

## 2015-10-08 ENCOUNTER — Other Ambulatory Visit: Payer: Self-pay

## 2015-10-08 ENCOUNTER — Encounter: Payer: Self-pay | Admitting: Family Medicine

## 2015-10-08 ENCOUNTER — Ambulatory Visit (INDEPENDENT_AMBULATORY_CARE_PROVIDER_SITE_OTHER): Payer: Medicare Other | Admitting: Family Medicine

## 2015-10-08 VITALS — BP 118/66 | HR 91 | Ht 60.0 in | Wt 165.0 lb

## 2015-10-08 DIAGNOSIS — M170 Bilateral primary osteoarthritis of knee: Secondary | ICD-10-CM | POA: Diagnosis not present

## 2015-10-08 DIAGNOSIS — I63341 Cerebral infarction due to thrombosis of right cerebellar artery: Secondary | ICD-10-CM | POA: Diagnosis not present

## 2015-10-08 DIAGNOSIS — M47818 Spondylosis without myelopathy or radiculopathy, sacral and sacrococcygeal region: Secondary | ICD-10-CM

## 2015-10-08 DIAGNOSIS — M4698 Unspecified inflammatory spondylopathy, sacral and sacrococcygeal region: Secondary | ICD-10-CM | POA: Diagnosis not present

## 2015-10-08 NOTE — Assessment & Plan Note (Signed)
Given injection of patient's left knee today of Monovisc. Tolerated the procedure well. We discussed icing regimen. Discussed home exercises. Once to avoid significant other things. Patient has worsening symptoms we can go back to corticosteroid injections.

## 2015-10-08 NOTE — Progress Notes (Signed)
CC: Back pain followup  HPI: Patient is a very pleasant 82 year old female coming in with chronic back pain. Was seen one month ago and given a sacroiliac injection. Been on an weeks since her last injection. Patient is having severe pain. Parkinson's disease continues to progress. Patient is having more and more difficulty with ambulation. Having more and more difficulty with daily activities as well.  Complaining of right knee pain. Patient at last exam was given injection and tolerated procedure well. States that she is making some strides. States that her left knee seems to begin her more pain. Given her more instability. Does not want to wear brace. Wondering if any other injection can be done. Steroid injection was within 3 months.    Previous imaging studies XR lumbar spine 06/07/2012: Findings: There is curvature convex to the left. There is advanced degenerative disc disease at T12-L1, L1-2 and L4-5. The L5-S1 level is transitional. There is anterolisthesis of 3 mm of L4-5.  There is retrolisthesis of 3 mm at T12-L1 and L1-2. These degenerative changes have worsened since 2010. No evidence of fracture.  IMPRESSION:  Curvature convex to the left. Worsening of degenerative disc disease at T12-L1, L1-2 and L4-5.   Past medical, surgical, family and social history reviewed. Medications reviewed all in the electronic medical record.  Review of Systems: No headache, visual changes, nausea, vomiting, diarrhea, constipation, dizziness, abdominal pain, skin rash, fevers, chills, night sweats, weight loss, swollen lymph nodes, body aches, joint swelling, muscle aches, chest pain, shortness of breath, mood changes.   Objective:    Blood pressure 118/66, pulse 91, height 5' (1.524 m), weight 165 lb (74.844 kg), SpO2 96 %.   General: No apparent distress alert and oriented x3 mood and affect normal, dressed appropriately. Masked faces worse than previous exam HEENT: Pupils equal, extraocular  movements intact Respiratory: Patient's speak in full sentences and does not appear short of breath Cardiovascular: 2+ pitting edema to mid thigh bilaterally Skin: Warm dry intact with no signs of infection or rash on extremities or on axial skeleton. Abdomen: Soft nontender Neuro: Cranial nerves II through XII are intact, neurovascularly intact in all extremities with 2+ DTRs and 2+ pulses. Lymph: No lymphadenopathy of posterior or anterior cervical chain or axillae bilaterally.  Gait walks with a walker and shuffling gait MSK: Non tender with full range of motion and good stability and symmetric strength and tone of shoulders, elbows, wrist, hip, and ankles bilaterally. Moderate loss of range of motion of multiple joints secondary to osteoporosis. Increasing in rigidity  Back Exam:  Inspection:increase kyphosis as well as some scoliosis Motion: Flexion 25 deg, Extension 5 deg, Side Bending to 15 deg bilaterally,  Rotation to 25 deg bilaterally patient is continuing to have decreasing progression of range of motion  SLR laying: pain but no radiation cogwheeling noted  Palpable tenderness: Worsening tenderness over the sacroiliac joint FABER: unable for patient to participate. Sensory change: Gross sensation intact to all lumbar and sacral dermatomes.  Reflexes: 2+ at both patellar tendons, 2+ at achilles tendons, Babinski's downgoing.  Strength at foot  Plantar-flexion: 5/5 Dorsi-flexion: 5/5 Eversion: 5/5 Inversion: 5/5  Leg strength  Quad: 4/5 Hamstring: 4/5 Hip flexor: 4/5 Hip abductors: 3/5  But symmetric to contralateral side.    Procedure: Real-time Ultrasound Guided Injection of right sacroiliac joint Device: GE Logiq E  Ultrasound guided injection is preferred based studies that show increased duration, increased effect, greater accuracy, decreased procedural pain, increased response rate, and decreased cost with  ultrasound guided versus blind injection.  Verbal informed  consent obtained.  Time-out conducted.  Noted no overlying erythema, induration, or other signs of local infection.  Skin prepped in a sterile fashion.  Local anesthesia: Topical Ethyl chloride.  With sterile technique and under real time ultrasound guidance:  With a 21-gauge 2 inch no patient was injected with a total of 0.5 mL of 0.5% Marcaine and 0.5 mL of Kenalog 40 mg/deciliter. Completed without difficulty  Pain immediately resolved suggesting accurate placement of the medication.  Advised to call if fevers/chills, erythema, induration, drainage, or persistent bleeding.  Images permanently stored and available for review in the ultrasound unit.  Impression: Technically successful ultrasound guided injection.  After informed written and verbal consent, patient was seated on exam table. Left knee was prepped with alcohol swab and utilizing anterolateral approach, patient's left knee space was injected with 15 mg/1 mL of monovisc (sodium hyaluronate) in a prefilled syringe was injected easily into the knee through a 22-gauge needle.. Patient tolerated the procedure well without immediate complications.  Impression and Recommendations:     This case required medical decision making of moderate complexity.

## 2015-10-08 NOTE — Assessment & Plan Note (Signed)
Given another injection today. This is all palliative at this point. Concern adding any type of pain medication due to her risk of fall. Patient will continue to ambulate with the aid of a walker. Continue to be careful for all. Patient will come back and see me again in 3-10 weeks for injections.

## 2015-10-08 NOTE — Patient Instructions (Addendum)
I am sorry you are still hurting.  Continue with ice.  Continue to work and move as much as you can.  I wish I had something better Lets try to wait at least 8 weeks between injections.

## 2015-10-10 DIAGNOSIS — M47817 Spondylosis without myelopathy or radiculopathy, lumbosacral region: Secondary | ICD-10-CM | POA: Diagnosis not present

## 2015-10-10 DIAGNOSIS — I69354 Hemiplegia and hemiparesis following cerebral infarction affecting left non-dominant side: Secondary | ICD-10-CM | POA: Diagnosis not present

## 2015-10-10 DIAGNOSIS — F329 Major depressive disorder, single episode, unspecified: Secondary | ICD-10-CM | POA: Diagnosis not present

## 2015-10-10 DIAGNOSIS — G20 Parkinson's disease: Secondary | ICD-10-CM | POA: Diagnosis not present

## 2015-10-10 DIAGNOSIS — I25119 Atherosclerotic heart disease of native coronary artery with unspecified angina pectoris: Secondary | ICD-10-CM | POA: Diagnosis not present

## 2015-10-10 DIAGNOSIS — M170 Bilateral primary osteoarthritis of knee: Secondary | ICD-10-CM | POA: Diagnosis not present

## 2015-10-13 ENCOUNTER — Other Ambulatory Visit (INDEPENDENT_AMBULATORY_CARE_PROVIDER_SITE_OTHER): Payer: Medicare Other

## 2015-10-13 ENCOUNTER — Encounter: Payer: Self-pay | Admitting: Internal Medicine

## 2015-10-13 ENCOUNTER — Ambulatory Visit (INDEPENDENT_AMBULATORY_CARE_PROVIDER_SITE_OTHER): Payer: Medicare Other | Admitting: Internal Medicine

## 2015-10-13 VITALS — BP 144/70 | HR 94 | Temp 97.8°F | Resp 14 | Ht 60.0 in | Wt 160.4 lb

## 2015-10-13 DIAGNOSIS — I63341 Cerebral infarction due to thrombosis of right cerebellar artery: Secondary | ICD-10-CM | POA: Diagnosis not present

## 2015-10-13 DIAGNOSIS — E538 Deficiency of other specified B group vitamins: Secondary | ICD-10-CM

## 2015-10-13 DIAGNOSIS — R413 Other amnesia: Secondary | ICD-10-CM | POA: Diagnosis not present

## 2015-10-13 DIAGNOSIS — W19XXXS Unspecified fall, sequela: Secondary | ICD-10-CM | POA: Diagnosis not present

## 2015-10-13 LAB — VITAMIN B12: VITAMIN B 12: 322 pg/mL (ref 211–911)

## 2015-10-13 LAB — COMPREHENSIVE METABOLIC PANEL
ALBUMIN: 4.1 g/dL (ref 3.5–5.2)
ALK PHOS: 46 U/L (ref 39–117)
ALT: 5 U/L (ref 0–35)
AST: 6 U/L (ref 0–37)
BILIRUBIN TOTAL: 0.3 mg/dL (ref 0.2–1.2)
BUN: 17 mg/dL (ref 6–23)
CALCIUM: 9.3 mg/dL (ref 8.4–10.5)
CO2: 27 meq/L (ref 19–32)
CREATININE: 0.75 mg/dL (ref 0.40–1.20)
Chloride: 94 mEq/L — ABNORMAL LOW (ref 96–112)
GFR: 77.67 mL/min (ref >60.00)
Glucose, Bld: 90 mg/dL (ref 70–99)
Potassium: 4.4 mEq/L (ref 3.5–5.1)
Sodium: 128 mEq/L — ABNORMAL LOW (ref 135–145)
TOTAL PROTEIN: 7 g/dL (ref 6.0–8.3)

## 2015-10-13 MED ORDER — ESTROGENS, CONJUGATED 0.625 MG/GM VA CREA: 1.0000 | TOPICAL_CREAM | Freq: Every day | VAGINAL | Status: DC

## 2015-10-13 NOTE — Progress Notes (Signed)
   Subjective:    Patient ID: Sarah [name], female    DOB: 12/29/1926, 82 y.o.   MRN: [id number]  HPI The patient is an 82 YO female coming in for follow up of her B12 deficiency. She just had the injection with sports medicine. She does not notice quite the fatigue with the injections. She is having some memory problems and does not want to talk about them with me. She has her caretaker with her but asked her to leave the room when she started mentioning something she did not want to discuss. During the visit she kept talking about what happened to her in 1916 however she was not born at that time. She was seeing psych but she does not feel that she needs that at this time. Admits to depression earlier in life and feels that she has overcome that.   Review of Systems  Constitutional: Negative for fever, activity change, appetite change and fatigue.  Eyes: Negative.   Respiratory: Negative for cough, chest tightness, shortness of breath and wheezing.   Cardiovascular: Negative for chest pain, palpitations and leg swelling.  Gastrointestinal: Negative for abdominal pain, diarrhea and constipation.  Genitourinary: Positive for urgency. Negative for dysuria.  Musculoskeletal: Positive for myalgias, back pain, arthralgias and neck pain. Negative for neck stiffness.  Skin: Negative.   Neurological: Positive for weakness and numbness. Negative for dizziness.       Memory change  Psychiatric/Behavioral: Negative.       Objective:   Physical Exam  Constitutional: She is oriented to person, place, and time. She appears well-developed and well-nourished.  HENT:  Head: Normocephalic and atraumatic.  Right Ear: External ear normal.  Left Ear: External ear normal.  Eyes: EOM are normal.  Neck: Normal range of motion.  Cardiovascular: Normal rate and regular rhythm.   Pulmonary/Chest: Effort normal and breath sounds normal. No respiratory distress. She has no wheezes.  Abdominal: Soft. She  exhibits no distension. There is no tenderness. There is no rebound.  Musculoskeletal: She exhibits no edema.  Neurological: She is alert and oriented to person, place, and time.  Skin: Skin is warm and dry.  Psychiatric: She has a normal mood and affect.   Filed Vitals:   10/13/15 1340  BP: 144/70  Pulse: 94  Temp: 97.8 F (36.6 C)  TempSrc: Oral  Resp: 14  Height: 5' (1.524 m)  Weight: 160 lb 6.4 oz (72.757 kg)  SpO2: 98%      Assessment & Plan:

## 2015-10-13 NOTE — Progress Notes (Signed)
Pre visit review using our clinic review tool, if applicable. No additional management support is needed unless otherwise documented below in the visit note. 

## 2015-10-13 NOTE — Patient Instructions (Signed)
We have sent in a vaginal cream called premarin. Use daily for 2 weeks then keeping using it about 2-3 times per week. This can help with the burning pain you are having.   We are checking the blood work today and will call you back with the results.

## 2015-10-14 LAB — CBC
HCT: 37.8 % (ref 36.0–46.0)
Hemoglobin: 12.7 g/dL (ref 12.0–15.0)
MCHC: 33.5 g/dL (ref 30.0–36.0)
MCV: 92 fl (ref 78.0–100.0)
Platelets: 408 10*3/uL — ABNORMAL HIGH (ref 150.0–400.0)
RBC: 4.11 Mil/uL (ref 3.87–5.11)
RDW: 13.3 % (ref 11.5–15.5)
WBC: 8.1 10*3/uL (ref 4.0–10.5)

## 2015-10-14 NOTE — Assessment & Plan Note (Signed)
B12 shot just given and encouraged her to continue getting monthly injections indefinitely.

## 2015-10-14 NOTE — Assessment & Plan Note (Signed)
Consistently falling at home due to balance and gait instability. She uses walker for some balance. Does not need PT/OT today.

## 2015-10-14 NOTE — Assessment & Plan Note (Signed)
Suspect some progression of her memory difficulties. She has been vigilant about her B12 shots so not likely to be the cause. She does have parkinson's which is degenerative and sometimes associated with memory changes. Had been seeing psych but she does not wish to go see them anymore. As she is not a threat to herself or others to my knowledge it is her choice whether she sees them or not.

## 2015-10-17 ENCOUNTER — Telehealth: Payer: Self-pay

## 2015-10-17 NOTE — Telephone Encounter (Signed)
FYI : Patient called about her labs. I informed her of all her labs. She understood. Thank you.

## 2015-10-20 ENCOUNTER — Ambulatory Visit (INDEPENDENT_AMBULATORY_CARE_PROVIDER_SITE_OTHER): Payer: Medicare Other | Admitting: Family

## 2015-10-20 ENCOUNTER — Other Ambulatory Visit: Payer: Self-pay | Admitting: Internal Medicine

## 2015-10-20 ENCOUNTER — Encounter: Payer: Self-pay | Admitting: Family

## 2015-10-20 DIAGNOSIS — I63341 Cerebral infarction due to thrombosis of right cerebellar artery: Secondary | ICD-10-CM | POA: Diagnosis not present

## 2015-10-20 DIAGNOSIS — J0140 Acute pansinusitis, unspecified: Secondary | ICD-10-CM

## 2015-10-20 DIAGNOSIS — J324 Chronic pansinusitis: Secondary | ICD-10-CM | POA: Insufficient documentation

## 2015-10-20 MED ORDER — AMOXICILLIN-POT CLAVULANATE 875-125 MG PO TABS: 1.0000 | ORAL_TABLET | Freq: Two times a day (BID) | ORAL | 0 refills | Status: DC

## 2015-10-20 NOTE — Assessment & Plan Note (Signed)
Symptoms and exam consistent with pansinusitis. Given multiple comorbidities will treat for sinus infection. Start Augmentin. Continue over-the-counter medications as needed for symptom relief and supportive care. Follow-up if symptoms worsen or do not improve.

## 2015-10-20 NOTE — Progress Notes (Signed)
Subjective:    Patient ID: Sarah [name], female    DOB: [birth year], 82 y.o.   MRN: [id number]  Chief Complaint  Patient presents with  . Headache    left ear pain, sinus pressure and pain, sinus headache, and congestion x3 days    HPI:  Sarah [name] is a 82 y.o. female who  has a past medical history of Bladder incontinence; Chest pain; Chronic venous insufficiency; Constipation; Depression; Diverticulitis; Esophageal stricture; GERD (gastroesophageal reflux disease); History of chicken pox; Hyperlipidemia; Hypertension; Osteoarthritis; Osteoporosis, postmenopausal (06/02/2011); Polyp of colon; and Stroke ([place]) (07/03/2014). and presents today for an acute office visit.  This is a new problem. Associated symptom of sinus pressure, left ear pain, sinus headache and congestion have been going on for about 3 days. Possible subjective fever last evening with nothing since then. Modifying factors include Tylenol and honey/vinegar home remedy which seems to have helped some. Course of the symptoms has stayed about the same and but may have had some worsening after getting better initially. No recent antibiotic use.   Allergies  Allergen Reactions  . Celebrex [Celecoxib]     rash  . Lipitor [Atorvastatin Calcium]     Leg weakness,memory loss  . Lovastatin     Leg weakness  . Macrobid [employer] Macro]     Unknown per patient  . Other     Always pads  . Latex Rash     Current Outpatient Prescriptions on File Prior to Visit  Medication Sig Dispense Refill  . acetaminophen (TYLENOL) 500 MG tablet Take 1 tablet (500 mg total) by mouth every 8 (eight) hours as needed (for pain). 180 tablet 2  . ALPRAZolam (XANAX) 1 MG tablet TAKE 1 TABLET TWICE DAILY. 60 tablet 2  . carbidopa-levodopa (SINEMET IR) 25-100 MG tablet Take 1 tablet by mouth 3 (three) times daily. 90 tablet 5  . Cholecalciferol (VITAMIN D PO) Take 1 tablet by mouth daily.     . clobetasol ointment (TEMOVATE) [id number]  % Apply 1 application topically 2 (two) times daily.     . clopidogrel (PLAVIX) 75 MG tablet TAKE 1 TABLET ONCE DAILY. 30 tablet 6  . conjugated estrogens (PREMARIN) vaginal cream Place 1 Applicatorful vaginally daily. 42.5 g 12  . diclofenac sodium (VOLTAREN) 1 % GEL Apply  To feet 3 times daily prn for pain 1 Tube 5  . furosemide (LASIX) 20 MG tablet TAKE 1 TABLET DAILY AS NEEDED. 30 tablet 0  . Glucosamine HCl 750 MG TABS Take 1 tablet by mouth 2 (two) times daily.     . Misc Natural Products (TUMERSAID) TABS Take 1 capsule by mouth 2 (two) times daily. 60 tablet 3  . nitroGLYCERIN (NITROSTAT) 0.4 MG SL tablet Place 1 tablet (0.4 mg total) under the tongue every 5 (five) minutes as needed for chest pain. 50 tablet prn  . nortriptyline (PAMELOR) 25 MG capsule TAKE 1 CAPSULE AT BEDTIME. 90 capsule 3  . pantoprazole (PROTONIX) 40 MG tablet TAKE 1 TABLET ONCE DAILY. 30 tablet 11  . pravastatin (PRAVACHOL) 20 MG tablet TAKE (1/2) TABLET DAILY. 45 tablet 0  . solifenacin (VESICARE) 10 MG tablet Take 10 mg by mouth daily as needed (to reduce number of bathroom visits during travel).     Current Facility-Administered Medications on File Prior to Visit  Medication Dose Route Frequency Provider Last Rate Last Dose  . cyanocobalamin ((VITAMIN B-12)) injection 1,000 mcg  1,000 mcg Intramuscular Q30 days Binnie Rail, MD  1,000 mcg at 06/27/15 1545     Review of Systems  Constitutional: Positive for fever. Negative for chills.  HENT: Positive for congestion, ear pain and sinus pressure. Negative for sore throat.   Respiratory: Positive for cough. Negative for chest tightness and shortness of breath.   Neurological: Positive for headaches.      Objective:    BP 140/72 (BP Location: Left Arm, Patient Position: Sitting, Cuff Size: Normal)   Pulse 73   Temp 98.1 F (36.7 C) (Oral)   Resp 14   Ht 5' (1.524 m)   SpO2 96%  Nursing note and vital signs reviewed.  Physical Exam  Constitutional:  She is oriented to person, place, and time. She appears well-developed and well-nourished.  HENT:  Right Ear: Hearing, tympanic membrane, external ear and ear canal normal.  Left Ear: Hearing, tympanic membrane, external ear and ear canal normal.  Nose: Right sinus exhibits maxillary sinus tenderness and frontal sinus tenderness. Left sinus exhibits maxillary sinus tenderness and frontal sinus tenderness.  Mouth/Throat: Uvula is midline, oropharynx is clear and moist and mucous membranes are normal.  Neck: Neck supple.  Cardiovascular: Normal rate, regular rhythm, normal heart sounds and intact distal pulses.   Pulmonary/Chest: Effort normal and breath sounds normal.  Neurological: She is alert and oriented to person, place, and time.  Skin: Skin is warm and dry.       Assessment & Plan:   Problem List Items Addressed This Visit      Respiratory   Pansinusitis    Symptoms and exam consistent with pansinusitis. Given multiple comorbidities will treat for sinus infection. Start Augmentin. Continue over-the-counter medications as needed for symptom relief and supportive care. Follow-up if symptoms worsen or do not improve.      Relevant Medications   amoxicillin-clavulanate (AUGMENTIN) 875-125 MG tablet    Other Visit Diagnoses   None.      I am having Ms. Fontanilla start on amoxicillin-clavulanate. I am also having her maintain her Cholecalciferol (VITAMIN D PO), clobetasol ointment, TUMERSAID, Glucosamine HCl, solifenacin, acetaminophen, nortriptyline, clopidogrel, diclofenac sodium, furosemide, nitroGLYCERIN, carbidopa-levodopa, pravastatin, pantoprazole, ALPRAZolam, conjugated estrogens, HALCION, and perphenazine. We will continue to administer cyanocobalamin.   Meds ordered this encounter  Medications  . amoxicillin-clavulanate (AUGMENTIN) 875-125 MG tablet    Sig: Take 1 tablet by mouth 2 (two) times daily.    Dispense:  20 tablet    Refill:  0    Order Specific Question:    Supervising Provider    Answer:   Pricilla Holm A [id number]     Follow-up: Return if symptoms worsen or fail to improve.  Mauricio Po, FNP

## 2015-10-20 NOTE — Telephone Encounter (Signed)
Sent to pharmacy 

## 2015-10-20 NOTE — Patient Instructions (Signed)
Thank you for choosing [facility].  Summary/Instructions:  Your prescription(s) have been submitted to your pharmacy or been printed and provided for you. Please take as directed and contact our office if you believe you are having problem(s) with the medication(s) or have any questions.  If your symptoms worsen or fail to improve, please contact our office for further instruction, or in case of emergency go directly to the emergency room at the closest medical facility.   General Recommendations:    Please drink plenty of fluids.  Get plenty of rest   Sleep in humidified air  Use saline nasal sprays  Netti pot   OTC Medications:  Decongestants - helps relieve congestion   Flonase (generic fluticasone) or Nasacort (generic triamcinolone) - please make sure to use the "cross-over" technique at a 45 degree angle towards the opposite eye as opposed to straight up the nasal passageway.   Sudafed (generic pseudoephedrine - Note this is the one that is available behind the pharmacy counter); Products with phenylephrine (-PE) may also be used but is often not as effective as pseudoephedrine.   If you have HIGH BLOOD PRESSURE - Coricidin HBP; AVOID any product that is -D as this contains pseudoephedrine which may increase your blood pressure.  Afrin (oxymetazoline) every 6-8 hours for up to 3 days.   Allergies - helps relieve runny nose, itchy eyes and sneezing   Claritin (generic loratidine), Allegra (fexofenidine), or Zyrtec (generic cyrterizine) for runny nose. These medications should not cause drowsiness.  Note - Benadryl (generic diphenhydramine) may be used however may cause drowsiness  Cough -   Delsym or Robitussin (generic dextromethorphan)  Expectorants - helps loosen mucus to ease removal   Mucinex (generic guaifenesin) as directed on the package.  Headaches / General Aches   Tylenol (generic acetaminophen) - DO NOT EXCEED 3 grams (3,000 mg) in a 24  hour time period  Advil/Motrin (generic ibuprofen)   Sore Throat -   Salt water gargle   Chloraseptic (generic benzocaine) spray or lozenges / Sucrets (generic dyclonine)    Sinusitis Sinusitis is redness, soreness, and inflammation of the paranasal sinuses. Paranasal sinuses are air pockets within the bones of your face (beneath the eyes, the middle of the forehead, or above the eyes). In healthy paranasal sinuses, mucus is able to drain out, and air is able to circulate through them by way of your nose. However, when your paranasal sinuses are inflamed, mucus and air can become trapped. This can allow bacteria and other germs to grow and cause infection. Sinusitis can develop quickly and last only a short time (acute) or continue over a long period (chronic). Sinusitis that lasts for more than 12 weeks is considered chronic.  CAUSES  Causes of sinusitis include:  Allergies.  Structural abnormalities, such as displacement of the cartilage that separates your nostrils (deviated septum), which can decrease the air flow through your nose and sinuses and affect sinus drainage.  Functional abnormalities, such as when the small hairs (cilia) that line your sinuses and help remove mucus do not work properly or are not present. SIGNS AND SYMPTOMS  Symptoms of acute and chronic sinusitis are the same. The primary symptoms are pain and pressure around the affected sinuses. Other symptoms include:  Upper toothache.  Earache.  Headache.  Bad breath.  Decreased sense of smell and taste.  A cough, which worsens when you are lying flat.  Fatigue.  Fever.  Thick drainage from your nose, which often is green and may   contain pus (purulent).  Swelling and warmth over the affected sinuses. DIAGNOSIS  Your health care provider will perform a physical exam. During the exam, your health care provider may:  Look in your nose for signs of abnormal growths in your nostrils (nasal  polyps).  Tap over the affected sinus to check for signs of infection.  View the inside of your sinuses (endoscopy) using an imaging device that has a light attached (endoscope). If your health care provider suspects that you have chronic sinusitis, one or more of the following tests may be recommended:  Allergy tests.  Nasal culture. A sample of mucus is taken from your nose, sent to a lab, and screened for bacteria.  Nasal cytology. A sample of mucus is taken from your nose and examined by your health care provider to determine if your sinusitis is related to an allergy. TREATMENT  Most cases of acute sinusitis are related to a viral infection and will resolve on their own within 10 days. Sometimes medicines are prescribed to help relieve symptoms (pain medicine, decongestants, nasal steroid sprays, or saline sprays).  However, for sinusitis related to a bacterial infection, your health care provider will prescribe antibiotic medicines. These are medicines that will help kill the bacteria causing the infection.  Rarely, sinusitis is caused by a fungal infection. In theses cases, your health care provider will prescribe antifungal medicine. For some cases of chronic sinusitis, surgery is needed. Generally, these are cases in which sinusitis recurs more than 3 times per year, despite other treatments. HOME CARE INSTRUCTIONS   Drink plenty of water. Water helps thin the mucus so your sinuses can drain more easily.  Use a humidifier.  Inhale steam 3 to 4 times a day (for example, sit in the bathroom with the shower running).  Apply a warm, moist washcloth to your face 3 to 4 times a day, or as directed by your health care provider.  Use saline nasal sprays to help moisten and clean your sinuses.  Take medicines only as directed by your health care provider.  If you were prescribed either an antibiotic or antifungal medicine, finish it all even if you start to feel better. SEEK IMMEDIATE  MEDICAL CARE IF:  You have increasing pain or severe headaches.  You have nausea, vomiting, or drowsiness.  You have swelling around your face.  You have vision problems.  You have a stiff neck.  You have difficulty breathing. MAKE SURE YOU:   Understand these instructions.  Will watch your condition.  Will get help right away if you are not doing well or get worse. Document Released: 03/15/2005 Document Revised: 07/30/2013 Document Reviewed: 03/30/2011 Exit[employer] Patient Information 2015 [employer]. This information is not intended to replace advice given to you by your health care provider. Make sure you discuss any questions you have with your health care provider.   

## 2015-10-21 ENCOUNTER — Telehealth: Payer: Self-pay | Admitting: Family

## 2015-10-21 ENCOUNTER — Telehealth: Payer: Self-pay | Admitting: *Deleted

## 2015-10-21 NOTE — Telephone Encounter (Signed)
Spoke with pharmacy and fixed issue with antibiotic

## 2015-10-21 NOTE — Telephone Encounter (Signed)
Rec'd fax stating pt cannot swallow tabs due to the esophageal stricture. Wanting to know can they switch the Amox-clav 875-125 mg tabs to the liquid strength 600 mg/42.9 mg 55ml BID for 10 days...Sarah [name]

## 2015-10-21 NOTE — Telephone Encounter (Signed)
Spoke with pharmacy this morning and advised to crush medication. If unable, then will send in alternative liquid.

## 2015-10-21 NOTE — Telephone Encounter (Signed)
[place] has questions about amoxicillin-clavulanate (AUGMENTIN) 875-125 MG  That Greg sent yesterday. Please call them back

## 2015-10-23 ENCOUNTER — Telehealth: Payer: Self-pay | Admitting: Emergency Medicine

## 2015-10-23 NOTE — Telephone Encounter (Signed)
Home health care called and wanted to advise you that she has cancelled her visit for this week because he has been ill. Just wanted to let you know thanks.

## 2015-10-23 NOTE — Telephone Encounter (Signed)
Ok, thanks.

## 2015-10-27 DIAGNOSIS — M170 Bilateral primary osteoarthritis of knee: Secondary | ICD-10-CM | POA: Diagnosis not present

## 2015-10-27 DIAGNOSIS — F329 Major depressive disorder, single episode, unspecified: Secondary | ICD-10-CM | POA: Diagnosis not present

## 2015-10-27 DIAGNOSIS — I25119 Atherosclerotic heart disease of native coronary artery with unspecified angina pectoris: Secondary | ICD-10-CM | POA: Diagnosis not present

## 2015-10-27 DIAGNOSIS — I69354 Hemiplegia and hemiparesis following cerebral infarction affecting left non-dominant side: Secondary | ICD-10-CM | POA: Diagnosis not present

## 2015-10-27 DIAGNOSIS — M47817 Spondylosis without myelopathy or radiculopathy, lumbosacral region: Secondary | ICD-10-CM | POA: Diagnosis not present

## 2015-10-27 DIAGNOSIS — G20 Parkinson's disease: Secondary | ICD-10-CM | POA: Diagnosis not present

## 2015-10-29 DIAGNOSIS — F419 Anxiety disorder, unspecified: Secondary | ICD-10-CM | POA: Diagnosis not present

## 2015-10-29 DIAGNOSIS — G629 Polyneuropathy, unspecified: Secondary | ICD-10-CM | POA: Diagnosis not present

## 2015-10-29 DIAGNOSIS — I872 Venous insufficiency (chronic) (peripheral): Secondary | ICD-10-CM | POA: Diagnosis not present

## 2015-10-29 DIAGNOSIS — Z7902 Long term (current) use of antithrombotics/antiplatelets: Secondary | ICD-10-CM | POA: Diagnosis not present

## 2015-10-29 DIAGNOSIS — G20 Parkinson's disease: Secondary | ICD-10-CM | POA: Diagnosis not present

## 2015-10-29 DIAGNOSIS — Z9181 History of falling: Secondary | ICD-10-CM | POA: Diagnosis not present

## 2015-10-29 DIAGNOSIS — M47817 Spondylosis without myelopathy or radiculopathy, lumbosacral region: Secondary | ICD-10-CM | POA: Diagnosis not present

## 2015-10-29 DIAGNOSIS — M6281 Muscle weakness (generalized): Secondary | ICD-10-CM | POA: Diagnosis not present

## 2015-10-29 DIAGNOSIS — I69354 Hemiplegia and hemiparesis following cerebral infarction affecting left non-dominant side: Secondary | ICD-10-CM | POA: Diagnosis not present

## 2015-10-29 DIAGNOSIS — M170 Bilateral primary osteoarthritis of knee: Secondary | ICD-10-CM | POA: Diagnosis not present

## 2015-10-29 DIAGNOSIS — I25119 Atherosclerotic heart disease of native coronary artery with unspecified angina pectoris: Secondary | ICD-10-CM | POA: Diagnosis not present

## 2015-10-29 DIAGNOSIS — I10 Essential (primary) hypertension: Secondary | ICD-10-CM | POA: Diagnosis not present

## 2015-10-29 DIAGNOSIS — F329 Major depressive disorder, single episode, unspecified: Secondary | ICD-10-CM | POA: Diagnosis not present

## 2015-11-05 ENCOUNTER — Telehealth: Payer: Self-pay

## 2015-11-05 DIAGNOSIS — H353122 Nonexudative age-related macular degeneration, left eye, intermediate dry stage: Secondary | ICD-10-CM | POA: Diagnosis not present

## 2015-11-05 DIAGNOSIS — H353112 Nonexudative age-related macular degeneration, right eye, intermediate dry stage: Secondary | ICD-10-CM | POA: Diagnosis not present

## 2015-11-05 DIAGNOSIS — H25013 Cortical age-related cataract, bilateral: Secondary | ICD-10-CM | POA: Diagnosis not present

## 2015-11-05 DIAGNOSIS — H2513 Age-related nuclear cataract, bilateral: Secondary | ICD-10-CM | POA: Diagnosis not present

## 2015-11-05 DIAGNOSIS — H40013 Open angle with borderline findings, low risk, bilateral: Secondary | ICD-10-CM | POA: Diagnosis not present

## 2015-11-05 NOTE — Telephone Encounter (Signed)
Paperwork signed, faxed, copy sent to scan 

## 2015-11-05 NOTE — Telephone Encounter (Signed)
Home Health Cert/Plan of Care received (10/29/2015 - 12/27/2015) and placed on MD's desk for signature

## 2015-11-07 DIAGNOSIS — G20 Parkinson's disease: Secondary | ICD-10-CM | POA: Diagnosis not present

## 2015-11-07 DIAGNOSIS — M170 Bilateral primary osteoarthritis of knee: Secondary | ICD-10-CM | POA: Diagnosis not present

## 2015-11-07 DIAGNOSIS — M47817 Spondylosis without myelopathy or radiculopathy, lumbosacral region: Secondary | ICD-10-CM | POA: Diagnosis not present

## 2015-11-07 DIAGNOSIS — I69354 Hemiplegia and hemiparesis following cerebral infarction affecting left non-dominant side: Secondary | ICD-10-CM | POA: Diagnosis not present

## 2015-11-07 DIAGNOSIS — I25119 Atherosclerotic heart disease of native coronary artery with unspecified angina pectoris: Secondary | ICD-10-CM | POA: Diagnosis not present

## 2015-11-07 DIAGNOSIS — F329 Major depressive disorder, single episode, unspecified: Secondary | ICD-10-CM | POA: Diagnosis not present

## 2015-11-08 ENCOUNTER — Other Ambulatory Visit: Payer: Self-pay | Admitting: Internal Medicine

## 2015-11-10 ENCOUNTER — Other Ambulatory Visit: Payer: Self-pay | Admitting: *Deleted

## 2015-11-10 MED ORDER — CLOPIDOGREL BISULFATE 75 MG PO TABS: 75.0000 mg | ORAL_TABLET | Freq: Every day | ORAL | 2 refills | Status: DC

## 2015-11-11 DIAGNOSIS — I69354 Hemiplegia and hemiparesis following cerebral infarction affecting left non-dominant side: Secondary | ICD-10-CM

## 2015-11-11 DIAGNOSIS — I10 Essential (primary) hypertension: Secondary | ICD-10-CM

## 2015-11-11 DIAGNOSIS — F419 Anxiety disorder, unspecified: Secondary | ICD-10-CM

## 2015-11-11 DIAGNOSIS — Z7902 Long term (current) use of antithrombotics/antiplatelets: Secondary | ICD-10-CM

## 2015-11-11 DIAGNOSIS — I25119 Atherosclerotic heart disease of native coronary artery with unspecified angina pectoris: Secondary | ICD-10-CM

## 2015-11-11 DIAGNOSIS — M6281 Muscle weakness (generalized): Secondary | ICD-10-CM

## 2015-11-11 DIAGNOSIS — Z9181 History of falling: Secondary | ICD-10-CM

## 2015-11-11 DIAGNOSIS — F329 Major depressive disorder, single episode, unspecified: Secondary | ICD-10-CM

## 2015-11-11 DIAGNOSIS — G629 Polyneuropathy, unspecified: Secondary | ICD-10-CM

## 2015-11-11 DIAGNOSIS — I872 Venous insufficiency (chronic) (peripheral): Secondary | ICD-10-CM

## 2015-11-11 DIAGNOSIS — G20 Parkinson's disease: Secondary | ICD-10-CM

## 2015-11-11 DIAGNOSIS — M47817 Spondylosis without myelopathy or radiculopathy, lumbosacral region: Secondary | ICD-10-CM

## 2015-11-11 DIAGNOSIS — M170 Bilateral primary osteoarthritis of knee: Secondary | ICD-10-CM

## 2015-11-19 DIAGNOSIS — H353122 Nonexudative age-related macular degeneration, left eye, intermediate dry stage: Secondary | ICD-10-CM | POA: Diagnosis not present

## 2015-11-19 DIAGNOSIS — H2513 Age-related nuclear cataract, bilateral: Secondary | ICD-10-CM | POA: Diagnosis not present

## 2015-11-19 DIAGNOSIS — H2512 Age-related nuclear cataract, left eye: Secondary | ICD-10-CM | POA: Diagnosis not present

## 2015-11-19 DIAGNOSIS — H25013 Cortical age-related cataract, bilateral: Secondary | ICD-10-CM | POA: Diagnosis not present

## 2015-11-19 DIAGNOSIS — H353112 Nonexudative age-related macular degeneration, right eye, intermediate dry stage: Secondary | ICD-10-CM | POA: Diagnosis not present

## 2015-11-20 ENCOUNTER — Other Ambulatory Visit: Payer: Self-pay | Admitting: Internal Medicine

## 2015-11-26 DIAGNOSIS — M170 Bilateral primary osteoarthritis of knee: Secondary | ICD-10-CM | POA: Diagnosis not present

## 2015-11-26 DIAGNOSIS — M47817 Spondylosis without myelopathy or radiculopathy, lumbosacral region: Secondary | ICD-10-CM | POA: Diagnosis not present

## 2015-11-26 DIAGNOSIS — I69354 Hemiplegia and hemiparesis following cerebral infarction affecting left non-dominant side: Secondary | ICD-10-CM | POA: Diagnosis not present

## 2015-11-26 DIAGNOSIS — G20 Parkinson's disease: Secondary | ICD-10-CM | POA: Diagnosis not present

## 2015-11-26 DIAGNOSIS — I25119 Atherosclerotic heart disease of native coronary artery with unspecified angina pectoris: Secondary | ICD-10-CM | POA: Diagnosis not present

## 2015-11-26 DIAGNOSIS — F329 Major depressive disorder, single episode, unspecified: Secondary | ICD-10-CM | POA: Diagnosis not present

## 2015-11-28 HISTORY — PX: CATARACT EXTRACTION, BILATERAL: SHX1313

## 2015-12-03 ENCOUNTER — Other Ambulatory Visit: Payer: Self-pay | Admitting: Internal Medicine

## 2015-12-04 NOTE — Telephone Encounter (Signed)
Sent to pharmacy 

## 2015-12-08 DIAGNOSIS — M47817 Spondylosis without myelopathy or radiculopathy, lumbosacral region: Secondary | ICD-10-CM | POA: Diagnosis not present

## 2015-12-08 DIAGNOSIS — G20 Parkinson's disease: Secondary | ICD-10-CM | POA: Diagnosis not present

## 2015-12-08 DIAGNOSIS — M170 Bilateral primary osteoarthritis of knee: Secondary | ICD-10-CM | POA: Diagnosis not present

## 2015-12-08 DIAGNOSIS — I69354 Hemiplegia and hemiparesis following cerebral infarction affecting left non-dominant side: Secondary | ICD-10-CM | POA: Diagnosis not present

## 2015-12-08 DIAGNOSIS — F329 Major depressive disorder, single episode, unspecified: Secondary | ICD-10-CM | POA: Diagnosis not present

## 2015-12-08 DIAGNOSIS — I25119 Atherosclerotic heart disease of native coronary artery with unspecified angina pectoris: Secondary | ICD-10-CM | POA: Diagnosis not present

## 2015-12-09 DIAGNOSIS — H2512 Age-related nuclear cataract, left eye: Secondary | ICD-10-CM | POA: Diagnosis not present

## 2015-12-09 DIAGNOSIS — H25812 Combined forms of age-related cataract, left eye: Secondary | ICD-10-CM | POA: Diagnosis not present

## 2015-12-09 DIAGNOSIS — H25012 Cortical age-related cataract, left eye: Secondary | ICD-10-CM | POA: Diagnosis not present

## 2015-12-19 ENCOUNTER — Other Ambulatory Visit: Payer: Self-pay | Admitting: Internal Medicine

## 2015-12-26 ENCOUNTER — Other Ambulatory Visit: Payer: Self-pay | Admitting: Internal Medicine

## 2015-12-26 DIAGNOSIS — M47817 Spondylosis without myelopathy or radiculopathy, lumbosacral region: Secondary | ICD-10-CM | POA: Diagnosis not present

## 2015-12-26 DIAGNOSIS — M170 Bilateral primary osteoarthritis of knee: Secondary | ICD-10-CM | POA: Diagnosis not present

## 2015-12-26 DIAGNOSIS — I69354 Hemiplegia and hemiparesis following cerebral infarction affecting left non-dominant side: Secondary | ICD-10-CM | POA: Diagnosis not present

## 2015-12-26 DIAGNOSIS — F329 Major depressive disorder, single episode, unspecified: Secondary | ICD-10-CM | POA: Diagnosis not present

## 2015-12-26 DIAGNOSIS — G20 Parkinson's disease: Secondary | ICD-10-CM | POA: Diagnosis not present

## 2015-12-26 DIAGNOSIS — I25119 Atherosclerotic heart disease of native coronary artery with unspecified angina pectoris: Secondary | ICD-10-CM | POA: Diagnosis not present

## 2015-12-28 ENCOUNTER — Other Ambulatory Visit: Payer: Self-pay | Admitting: Internal Medicine

## 2015-12-28 DIAGNOSIS — F329 Major depressive disorder, single episode, unspecified: Secondary | ICD-10-CM | POA: Diagnosis not present

## 2015-12-28 DIAGNOSIS — I69354 Hemiplegia and hemiparesis following cerebral infarction affecting left non-dominant side: Secondary | ICD-10-CM | POA: Diagnosis not present

## 2015-12-28 DIAGNOSIS — M170 Bilateral primary osteoarthritis of knee: Secondary | ICD-10-CM | POA: Diagnosis not present

## 2015-12-28 DIAGNOSIS — M47817 Spondylosis without myelopathy or radiculopathy, lumbosacral region: Secondary | ICD-10-CM | POA: Diagnosis not present

## 2015-12-28 DIAGNOSIS — G20 Parkinson's disease: Secondary | ICD-10-CM | POA: Diagnosis not present

## 2015-12-28 DIAGNOSIS — I25119 Atherosclerotic heart disease of native coronary artery with unspecified angina pectoris: Secondary | ICD-10-CM | POA: Diagnosis not present

## 2015-12-29 NOTE — Telephone Encounter (Signed)
Faxed script back to [place].../lmb 

## 2015-12-29 NOTE — Telephone Encounter (Signed)
Done on 12/26/15

## 2016-01-02 DIAGNOSIS — G20 Parkinson's disease: Secondary | ICD-10-CM | POA: Diagnosis not present

## 2016-01-02 DIAGNOSIS — F329 Major depressive disorder, single episode, unspecified: Secondary | ICD-10-CM | POA: Diagnosis not present

## 2016-01-02 DIAGNOSIS — I69354 Hemiplegia and hemiparesis following cerebral infarction affecting left non-dominant side: Secondary | ICD-10-CM | POA: Diagnosis not present

## 2016-01-02 DIAGNOSIS — M170 Bilateral primary osteoarthritis of knee: Secondary | ICD-10-CM | POA: Diagnosis not present

## 2016-01-02 DIAGNOSIS — M47817 Spondylosis without myelopathy or radiculopathy, lumbosacral region: Secondary | ICD-10-CM | POA: Diagnosis not present

## 2016-01-02 DIAGNOSIS — I25119 Atherosclerotic heart disease of native coronary artery with unspecified angina pectoris: Secondary | ICD-10-CM | POA: Diagnosis not present

## 2016-01-09 DIAGNOSIS — F329 Major depressive disorder, single episode, unspecified: Secondary | ICD-10-CM | POA: Diagnosis not present

## 2016-01-09 DIAGNOSIS — I69354 Hemiplegia and hemiparesis following cerebral infarction affecting left non-dominant side: Secondary | ICD-10-CM | POA: Diagnosis not present

## 2016-01-09 DIAGNOSIS — G20 Parkinson's disease: Secondary | ICD-10-CM | POA: Diagnosis not present

## 2016-01-09 DIAGNOSIS — M47817 Spondylosis without myelopathy or radiculopathy, lumbosacral region: Secondary | ICD-10-CM | POA: Diagnosis not present

## 2016-01-09 DIAGNOSIS — I25119 Atherosclerotic heart disease of native coronary artery with unspecified angina pectoris: Secondary | ICD-10-CM | POA: Diagnosis not present

## 2016-01-09 DIAGNOSIS — M170 Bilateral primary osteoarthritis of knee: Secondary | ICD-10-CM | POA: Diagnosis not present

## 2016-01-13 NOTE — Progress Notes (Signed)
CC: Back pain followup  HPI: Patient is a very pleasant 82 year old female coming in with chronic back pain. Was seen one month ago and given a sacroiliac injection. Been 12 weeks since her last injection. Patient is having severe pain. Parkinson's disease continues to progress. Patient is having more and more difficulty with ambulation. Having more and more difficulty with daily activities as well.  Complaining of right knee pain. Severe osteophytic changes. Given viscous supplementation  3 months ago. Patient states right knee is hurting that her left knee is very more. Feels like she is having increasing instability. States that the pain is severe. Rates the severity of pain is 8 out of 10.    Previous imaging studies XR lumbar spine 06/07/2012: Findings: There is curvature convex to the left. There is advanced degenerative disc disease at T12-L1, L1-2 and L4-5. The L5-S1 level is transitional. There is anterolisthesis of 3 mm of L4-5.  There is retrolisthesis of 3 mm at T12-L1 and L1-2. These degenerative changes have worsened since 2010. No evidence of fracture.  IMPRESSION:  Curvature convex to the left. Worsening of degenerative disc disease at T12-L1, L1-2 and L4-5.   Past medical, surgical, family and social history reviewed. Medications reviewed all in the electronic medical record.  Review of Systems: No headache, visual changes, nausea, vomiting, diarrhea, constipation, dizziness, abdominal pain, skin rash, fevers, chills, night sweats, weight loss, swollen lymph nodes, body aches, joint swelling, muscle aches, chest pain, shortness of breath, mood changes.   Objective:    Blood pressure 128/82, weight 165 lb (74.8 kg).   General: No apparent distress alert and oriented x3 mood and affect normal, dressed appropriately. Masked faces worse than previous exam HEENT: Pupils equal, extraocular movements intact Respiratory: Patient's speak in full sentences and does not appear short of  breath Cardiovascular: 2+ pitting edema to mid thigh bilaterally Skin: Warm dry intact with no signs of infection or rash on extremities or on axial skeleton. Abdomen: Soft nontender Neuro: Cranial nerves II through XII are intact, neurovascularly intact in all extremities with 2+ DTRs and 2+ pulses. Lymph: No lymphadenopathy of posterior or anterior cervical chain or axillae bilaterally.  Gait walks with a walker and shuffling gait very unsteady MSK: Non tender with full range of motion and good stability and symmetric strength and tone of shoulders, elbows, wrist, hip, and ankles bilaterally. Moderate loss of range of motion of multiple joints secondary to osteoporosis. Increasing in rigidity severe tenderness to palpation of multiple joints especially the medial aspect of the knees bilaterally left couldn't and right instability of the left knee with valgus force  Back Exam:  Inspection:increase kyphosis as well as some scoliosis Motion: Flexion 25 deg, Extension 5 deg, Side Bending to 15 deg bilaterally,  Rotation to 25 deg bilaterally patient is continuing to have decreasing progression of range of motion very unsteady on her feet Unable to lay down secondary to pain. Cogwheeling noted of upper and lower extremity is.  Palpable tenderness: Continued worsening sacroiliac pain Sensory change: Gross sensation intact to all lumbar and sacral dermatomes.  Reflexes: 2+ at both patellar tendons, 2+ at achilles tendons, Babinski's downgoing.  Strength at foot  Plantar-flexion: 5/5 Dorsi-flexion: 5/5 Eversion: 5/5 Inversion: 5/5  Leg strength  Quad: 4/5 Hamstring: 4/5 Hip flexor: 4/5 Hip abductors: 3/5  But symmetric to contralateral side.    Procedure: Real-time Ultrasound Guided Injection of right sacroiliac joint Device: GE Logiq E  Ultrasound guided injection is preferred based studies that show increased  duration, increased effect, greater accuracy, decreased procedural pain, increased  response rate, and decreased cost with ultrasound guided versus blind injection.  Verbal informed consent obtained.  Time-out conducted.  Noted no overlying erythema, induration, or other signs of local infection.  Skin prepped in a sterile fashion.  Local anesthesia: Topical Ethyl chloride.  With sterile technique and under real time ultrasound guidance:  With a 21-gauge 2 inch no patient was injected with a total of 0.5 mL of 0.5% Marcaine and 0.5 mL of Kenalog 40 mg/deciliter. Completed without difficulty  Pain immediately resolved suggesting accurate placement of the medication.  Advised to call if fevers/chills, erythema, induration, drainage, or persistent bleeding.  Images permanently stored and available for review in the ultrasound unit.  Impression: Technically successful ultrasound guided injection.  After informed written and verbal consent, patient was seated on exam table. Left knee was prepped with alcohol swab and utilizing anterolateral approach, patient's left knee space was injected with 4:1  marcaine 0.5%: Kenalog 40mg /dL. Patient tolerated the procedure well without immediate complications.  Impression and Recommendations:     This case required medical decision making of moderate complexity.

## 2016-01-14 ENCOUNTER — Ambulatory Visit (INDEPENDENT_AMBULATORY_CARE_PROVIDER_SITE_OTHER): Payer: Medicare Other | Admitting: Family Medicine

## 2016-01-14 ENCOUNTER — Encounter: Payer: Self-pay | Admitting: Family Medicine

## 2016-01-14 ENCOUNTER — Ambulatory Visit: Payer: Self-pay

## 2016-01-14 VITALS — BP 128/82 | Wt 165.0 lb

## 2016-01-14 DIAGNOSIS — Z23 Encounter for immunization: Secondary | ICD-10-CM

## 2016-01-14 DIAGNOSIS — G8929 Other chronic pain: Secondary | ICD-10-CM | POA: Diagnosis not present

## 2016-01-14 DIAGNOSIS — I63341 Cerebral infarction due to thrombosis of right cerebellar artery: Secondary | ICD-10-CM | POA: Diagnosis not present

## 2016-01-14 DIAGNOSIS — M4698 Unspecified inflammatory spondylopathy, sacral and sacrococcygeal region: Secondary | ICD-10-CM

## 2016-01-14 DIAGNOSIS — M533 Sacrococcygeal disorders, not elsewhere classified: Principal | ICD-10-CM

## 2016-01-14 DIAGNOSIS — M170 Bilateral primary osteoarthritis of knee: Secondary | ICD-10-CM

## 2016-01-14 DIAGNOSIS — M47818 Spondylosis without myelopathy or radiculopathy, sacral and sacrococcygeal region: Secondary | ICD-10-CM

## 2016-01-14 NOTE — Assessment & Plan Note (Signed)
Given injection again. Keeps having some mild to moderate improvement in pain. Once again with patient's other comorbidities and an element Duke chronic pain medications in this individual at the moment. She may need some low in the long run depending on how much she keeps moving. Patient is had an increase her risk of fall though at the moment. As long as his continues to give her some relief we will repeat every 8-10 weeks.

## 2016-01-14 NOTE — Assessment & Plan Note (Signed)
Patient given injection of left knee. Tolerated the procedure well. We discussed icing regimen and home exercises. Patient does not want to wear a custom brace with patient's arthritic changes likely would not contribute to significant improvement. Return again in 8-10 weeks.

## 2016-01-14 NOTE — Patient Instructions (Signed)
Good to see you as always.  Keep trucking along.  I wish I had a magic wand to help  I am here for my needled again in 2 months if you need me.

## 2016-01-16 ENCOUNTER — Ambulatory Visit: Payer: Medicare Other | Admitting: Neurology

## 2016-01-16 DIAGNOSIS — F329 Major depressive disorder, single episode, unspecified: Secondary | ICD-10-CM | POA: Diagnosis not present

## 2016-01-16 DIAGNOSIS — M47817 Spondylosis without myelopathy or radiculopathy, lumbosacral region: Secondary | ICD-10-CM | POA: Diagnosis not present

## 2016-01-16 DIAGNOSIS — I69354 Hemiplegia and hemiparesis following cerebral infarction affecting left non-dominant side: Secondary | ICD-10-CM | POA: Diagnosis not present

## 2016-01-16 DIAGNOSIS — G20 Parkinson's disease: Secondary | ICD-10-CM | POA: Diagnosis not present

## 2016-01-16 DIAGNOSIS — M170 Bilateral primary osteoarthritis of knee: Secondary | ICD-10-CM | POA: Diagnosis not present

## 2016-01-16 DIAGNOSIS — I25119 Atherosclerotic heart disease of native coronary artery with unspecified angina pectoris: Secondary | ICD-10-CM | POA: Diagnosis not present

## 2016-01-19 ENCOUNTER — Other Ambulatory Visit: Payer: Self-pay | Admitting: Internal Medicine

## 2016-01-20 DIAGNOSIS — M170 Bilateral primary osteoarthritis of knee: Secondary | ICD-10-CM | POA: Diagnosis not present

## 2016-01-20 DIAGNOSIS — G20 Parkinson's disease: Secondary | ICD-10-CM | POA: Diagnosis not present

## 2016-01-20 DIAGNOSIS — M47817 Spondylosis without myelopathy or radiculopathy, lumbosacral region: Secondary | ICD-10-CM | POA: Diagnosis not present

## 2016-01-20 DIAGNOSIS — I69354 Hemiplegia and hemiparesis following cerebral infarction affecting left non-dominant side: Secondary | ICD-10-CM | POA: Diagnosis not present

## 2016-01-20 DIAGNOSIS — F329 Major depressive disorder, single episode, unspecified: Secondary | ICD-10-CM | POA: Diagnosis not present

## 2016-01-20 DIAGNOSIS — I25119 Atherosclerotic heart disease of native coronary artery with unspecified angina pectoris: Secondary | ICD-10-CM | POA: Diagnosis not present

## 2016-01-28 DIAGNOSIS — G20 Parkinson's disease: Secondary | ICD-10-CM | POA: Diagnosis not present

## 2016-01-28 DIAGNOSIS — F329 Major depressive disorder, single episode, unspecified: Secondary | ICD-10-CM | POA: Diagnosis not present

## 2016-01-28 DIAGNOSIS — M47817 Spondylosis without myelopathy or radiculopathy, lumbosacral region: Secondary | ICD-10-CM | POA: Diagnosis not present

## 2016-01-28 DIAGNOSIS — I69354 Hemiplegia and hemiparesis following cerebral infarction affecting left non-dominant side: Secondary | ICD-10-CM | POA: Diagnosis not present

## 2016-01-28 DIAGNOSIS — I25119 Atherosclerotic heart disease of native coronary artery with unspecified angina pectoris: Secondary | ICD-10-CM | POA: Diagnosis not present

## 2016-01-28 DIAGNOSIS — M170 Bilateral primary osteoarthritis of knee: Secondary | ICD-10-CM | POA: Diagnosis not present

## 2016-02-03 DIAGNOSIS — I69354 Hemiplegia and hemiparesis following cerebral infarction affecting left non-dominant side: Secondary | ICD-10-CM | POA: Diagnosis not present

## 2016-02-03 DIAGNOSIS — I25119 Atherosclerotic heart disease of native coronary artery with unspecified angina pectoris: Secondary | ICD-10-CM | POA: Diagnosis not present

## 2016-02-03 DIAGNOSIS — F329 Major depressive disorder, single episode, unspecified: Secondary | ICD-10-CM | POA: Diagnosis not present

## 2016-02-03 DIAGNOSIS — M170 Bilateral primary osteoarthritis of knee: Secondary | ICD-10-CM | POA: Diagnosis not present

## 2016-02-03 DIAGNOSIS — M47817 Spondylosis without myelopathy or radiculopathy, lumbosacral region: Secondary | ICD-10-CM | POA: Diagnosis not present

## 2016-02-03 DIAGNOSIS — G20 Parkinson's disease: Secondary | ICD-10-CM | POA: Diagnosis not present

## 2016-02-04 ENCOUNTER — Other Ambulatory Visit: Payer: Self-pay | Admitting: Neurology

## 2016-02-04 NOTE — Telephone Encounter (Signed)
Rx sent 

## 2016-02-06 ENCOUNTER — Ambulatory Visit (INDEPENDENT_AMBULATORY_CARE_PROVIDER_SITE_OTHER): Payer: Medicare Other | Admitting: Neurology

## 2016-02-06 ENCOUNTER — Encounter: Payer: Self-pay | Admitting: Neurology

## 2016-02-06 ENCOUNTER — Ambulatory Visit: Payer: Medicare Other | Admitting: Neurology

## 2016-02-06 VITALS — BP 128/70 | HR 81 | Ht 60.0 in | Wt 162.1 lb

## 2016-02-06 DIAGNOSIS — G20C Parkinsonism, unspecified: Secondary | ICD-10-CM

## 2016-02-06 DIAGNOSIS — I63341 Cerebral infarction due to thrombosis of right cerebellar artery: Secondary | ICD-10-CM | POA: Diagnosis not present

## 2016-02-06 DIAGNOSIS — G609 Hereditary and idiopathic neuropathy, unspecified: Secondary | ICD-10-CM

## 2016-02-06 DIAGNOSIS — G20 Parkinson's disease: Secondary | ICD-10-CM | POA: Diagnosis not present

## 2016-02-06 DIAGNOSIS — E538 Deficiency of other specified B group vitamins: Secondary | ICD-10-CM

## 2016-02-06 NOTE — Patient Instructions (Addendum)
You are looking great!  Continue your medications as you are taking them  Start oral vitamin B12 1032mcg daily  Return to clinic in 4 months

## 2016-02-06 NOTE — Progress Notes (Signed)
Sarah [name]  Follow-up Visit   Date: 02/06/16   Sarah [name] MRN: [id number] DOB: [birth year]   Interim History: Sarah [name] is a 82 y.o. right-handed Caucasian female with history of HTN, HPL, depression, esophageal strictures, and GERD returning to the clinic for follow-up of Parkinson's disease and gait instability.  The patient was accompanied to the clinic by her caregiver, [name].  History of present illness: Leg weakness started slowly around May 2014 when she noticed difficulty with walking and tripping on things. She would stumble frequently, no falls.  She has associated bilateral achy knee pain and chronic back pain but had worsening pain in August 2014. Pain is achy located at the low back and does not radiate into her buttocks or legs. Forward flexion makes her pain worse and it is improved by laying down. EMG was performed and shows moderate peripheral neuropathy of the legs and L3-L4 old radiculopathy.   Follow-up 10/09/2013:  She had an unwittnessed fall on 6/17 while transferring a few things to her dining table.  She hit the back of her head and bruised her right wrist.  Caregiver says that she is more forgetful than usual, especially with conversation.  Since her fall, she has had intermittent headaches, described as achy, occuring daily and last all day. It usually responds to tylenol.    UPDATE 09/02/2014:  Patient had an interval hospitalization for acute stroke manifested with slurred speech which has since resolved.  She called for same-day appointment because of severe bifrontal headache since last week.  She went to the ED 6/2 where tramadol was given and provided relief. She is getting home PT and feels that she may have over exerted herself lately which could have triggered her migraine.    UPDATE 09/20/2014:  She is so relieved that her headaches improved with prednisone.  She is doing well and has not had any further headaches, slurred  speech, or falls.    UPDATE 12/27/2014:   She started having headaches several weeks ago which are bifrontal and throbbing. She went to urgent care on 9/26 where she was givne prednisone and amoxicillin for possible sinusitis. She tried excedrin migraine did not provide any relief.  No worsening of headache when laying flat, with sneezing/coughing, and she denies any vision changes.  Headache is similar to what she has experienced in the past. She was give tizanidine, but did not tolerate this.  Neck PT helped.   UPDATE 05/05/2015:  She is complaining of joint pain, especially in the knees and leg swelling.  Her headaches are much improved since she started taking ginger supplements.  She denies painful paresthesias of the feet, but does feel like her balance is becoming worse.  Fortunately, there have been no interval falls.  She continues to use a rollator.    UPDATE 07/01/2015:  Patient called for urgent visit due to worsening headaches and gait instability.  She arrived 15 minutes late to her appointment.  About 3-4 weeks ago, she began having rhinitis, head congestion, and headaches. [name] suggested this may be due to allergies, which she often gets, so started taking claritin and flonase which does provide relief.  Patient also has been taking tylenol or excedrin daily for the past month.    She reports having a stumble a few weeks ago, but her life alert did not work.  Fortunately, she was outside trying to pick up a piece of paper on the floor and some neighbors helped her up.  She is very depressed and lacks the motivation and interest to engage in physical therapy or other activities she usually would enjoy.  She is worried that she will be unable to attend her granddaughter's wedding in [place].  Her memory has been much worse lately and she often forgets what she is saying in mid conversation.   UPDATE 09/26/2015:  She reports having a slow gradual decline in overall function.  She is doing  less home PT and feels movements are slower and she is less able than before. She notices a marked difference in her walking when she is taking sinemet, but still is not able to tolerate a whole pill due to nausea.  Mood has been better and she no longer has frequent headaches.  UPDATE 02/06/2016:    She complains of a lot of generalized and chronic complaints including polyarthralgia, fatigue, lack of energy, headaches, and low mood  She has been able to tolerate sinemet 1 tab TID and has noticed marked improvement with tremor and gait.  She is having PT at home weekly through [facility]. She has not had any interval falls or hospitalizations.  Her headaches are dull and respond to ibuprofen.   Medications:  Current Outpatient Prescriptions on File Prior to Visit  Medication Sig Dispense Refill  . acetaminophen (TYLENOL) 500 MG tablet Take 1 tablet (500 mg total) by mouth every 8 (eight) hours as needed (for pain). 180 tablet 2  . ALPRAZolam (XANAX) 1 MG tablet TAKE 1 TABLET TWICE DAILY. 60 tablet 3  . amoxicillin-clavulanate (AUGMENTIN) 875-125 MG tablet Take 1 tablet by mouth 2 (two) times daily. 20 tablet 0  . carbidopa-levodopa (SINEMET IR) 25-100 MG tablet TAKE 1 TABLET 3 TIMES A DAY. 90 tablet 3  . Cholecalciferol (VITAMIN D PO) Take 1 tablet by mouth daily.     . clobetasol ointment (TEMOVATE) [id number] % Apply 1 application topically 2 (two) times daily.     . clopidogrel (PLAVIX) 75 MG tablet Take 1 tablet (75 mg total) by mouth daily. 90 tablet 2  . conjugated estrogens (PREMARIN) vaginal cream Place 1 Applicatorful vaginally daily. 42.5 g 12  . diclofenac sodium (VOLTAREN) 1 % GEL Apply  To feet 3 times daily prn for pain 1 Tube 5  . furosemide (LASIX) 20 MG tablet TAKE 1 TABLET DAILY AS NEEDED. 30 tablet 0  . Glucosamine HCl 750 MG TABS Take 1 tablet by mouth 2 (two) times daily.     Marland Kitchen HALCION 0.25 MG tablet TAKE 1/2 TABLET AT BEDTIME AS NEEDED FOR SLEEP. 45 tablet 0  . Misc Natural  Products (TUMERSAID) TABS Take 1 capsule by mouth 2 (two) times daily. 60 tablet 3  . nitroGLYCERIN (NITROSTAT) 0.4 MG SL tablet Place 1 tablet (0.4 mg total) under the tongue every 5 (five) minutes as needed for chest pain. 50 tablet prn  . nortriptyline (PAMELOR) 25 MG capsule TAKE 1 CAPSULE AT BEDTIME. 90 capsule 3  . pantoprazole (PROTONIX) 40 MG tablet TAKE 1 TABLET ONCE DAILY. 30 tablet 11  . perphenazine (TRILAFON) 2 MG tablet TAKE ONE TABLET AT BEDTIME. 30 tablet 0  . pravastatin (PRAVACHOL) 20 MG tablet TAKE (1/2) TABLET DAILY. 45 tablet 0  . solifenacin (VESICARE) 10 MG tablet Take 10 mg by mouth daily as needed (to reduce number of bathroom visits during travel).     Current Facility-Administered Medications on File Prior to Visit  Medication Dose Route Frequency Provider Last Rate Last Dose  . cyanocobalamin ((VITAMIN B-12)) injection 1,000  mcg  1,000 mcg Intramuscular Q30 days Binnie Rail, MD   1,000 mcg at 06/27/15 1545    Allergies:  Allergies  Allergen Reactions  . Celebrex [Celecoxib]     rash  . Lipitor [Atorvastatin Calcium]     Leg weakness,memory loss  . Lovastatin     Leg weakness  . Macrobid [employer] Macro]     Unknown per patient  . Other     Always pads  . Latex Rash     Review of Systems:  CONSTITUTIONAL: No fevers, chills, night sweats, or weight loss.   EYES: No visual changes or eye pain ENT: No hearing changes.  No history of nose bleeds. RESPIRATORY: No cough, wheezing and shortness of breath.   CARDIOVASCULAR: Negative for chest pain, and palpitations.   GI: Negative for abdominal discomfort, blood in stools or black stools.  No recent change in bowel habits.  GU:  No history of incontinence.   MUSCLOSKELETAL: +history of joint pain or swelling.  +myalgias.  + back pain SKIN: Negative for lesions, rash, and itching.   HEMATOLOGY/ONCOLOGY: Negative for prolonged bleeding, bruising easily, and swollen nodes.   ENDOCRINE: Negative  for cold or heat intolerance, polydipsia or goiter.   PSYCH:  +depression or anxiety symptoms.   NEURO: As Above.   Vital Signs:  BP 128/70   Pulse 81   Ht 5' (1.524 m)   Wt 162 lb 2 oz (73.5 kg)   SpO2 98%   BMI 31.66 kg/m   Neurological Exam: MENTAL STATUS including orientation to time, place, person, recent and remote memory is fairly intact. Severely blunted affect.   CRANIAL NERVES:  Pupils round and reactive to light. Extraocular muscles are intact. Face is symmetric. Palate elevates symmetrically and tongue is midline.    MOTOR:  Motor strength is 5/5 in proximal muscles. Tone is normal throughout (improved).  There is no resting hand tremor (improved)  COORDINATION/GAIT:  Finger tapping is markedly improved and there is on bradykinesia.  Toe tapping is still slowed.  Gait is stooped, much more steady with walker, with normal cadence.  There is no shuffling.   Data: EMG 01/15/2013:  Taken together, these findings are consistent with a moderate generalized, large fiber, sensorimotor polyneuropathy affecting the left side. There is a superimposed intraspinal canal lesion (i.e. radiculopathy) affecting the L2-L4 nerve root/segments bilaterally, moderate in degree electrically.  CT brain 10/15/2013: No skull fracture or intracranial hemorrhage. Fluid level within the right maxillary sinus may be related to sinus  disease without secondary findings of orbital injury. If orbital injury were of clinical concern, CT orbits recommended for further  delineation. Prominent small vessel disease type changes without CT evidence of large acute infarct.  Mild global atrophy without hydrocephalus.  MRI/A Brain 07/03/2014: 1. Acute nonhemorrhagic 21 mm white matter infarct of the posterior right frontal lobe. 2. Extensive atrophy and diffuse white matter disease corresponds to advanced chronic microvascular ischemia. 3. Moderate to high-grade stenosis of the infra posterior right M2  branch. 4. Moderate small vessel disease is evident on the MRA. 5. High-grade stenosis in the proximal left P2 segment.  US carotids 07/04/2014:  1-39% ICA stenosis bilaterally TTE 07/04/2014:  Within normal limits, no thrombus   IMPRESSION/PLAN:   1.  Parkinsonism, REM behavior disorder  - improved with sinemet.  No rigidity or tremor on exam and gait appears improved.  - Continue sinemet 25/100 1 tab TID  - Fall precautions discussed  - Continue to use rollator  -  Continue physical therapy  2. Medication overuse headaches   - Limit tylenol and Excedrin to twice per week  3.  Peripheral neuropathy and gait instability   - Continue nortriptyline 25mg  qhs which controls her painful paresthesias, avoid increasing the dose due to sedation   4.  Vitamin B12 deficiency  - Start oral vitamin B12 1045mcg daily  5.  Senile dementia  6.  Advanced age  - We discussed goals of care moving forward and she has already set up POA, Advanced Directives (DNR), and Living Will  Return to clinic in 55-months   The duration of this appointment visit was 30 minutes of face-to-face time with the patient.  Greater than 50% of this time was spent in counseling, explanation of diagnosis, planning of further management, and coordination of care.   Thank you for allowing me to participate in patient's care.  If I can answer any additional questions, I would be pleased to do so.    Sincerely,    Markel Mergenthaler K. Posey Pronto, DO

## 2016-02-10 DIAGNOSIS — I25119 Atherosclerotic heart disease of native coronary artery with unspecified angina pectoris: Secondary | ICD-10-CM | POA: Diagnosis not present

## 2016-02-10 DIAGNOSIS — F329 Major depressive disorder, single episode, unspecified: Secondary | ICD-10-CM | POA: Diagnosis not present

## 2016-02-10 DIAGNOSIS — I69354 Hemiplegia and hemiparesis following cerebral infarction affecting left non-dominant side: Secondary | ICD-10-CM | POA: Diagnosis not present

## 2016-02-10 DIAGNOSIS — G20 Parkinson's disease: Secondary | ICD-10-CM | POA: Diagnosis not present

## 2016-02-10 DIAGNOSIS — M170 Bilateral primary osteoarthritis of knee: Secondary | ICD-10-CM | POA: Diagnosis not present

## 2016-02-10 DIAGNOSIS — M47817 Spondylosis without myelopathy or radiculopathy, lumbosacral region: Secondary | ICD-10-CM | POA: Diagnosis not present

## 2016-02-11 ENCOUNTER — Other Ambulatory Visit: Payer: Self-pay | Admitting: Internal Medicine

## 2016-02-15 ENCOUNTER — Other Ambulatory Visit: Payer: Self-pay | Admitting: Internal Medicine

## 2016-02-16 ENCOUNTER — Ambulatory Visit: Payer: Medicare Other | Admitting: Cardiology

## 2016-02-16 DIAGNOSIS — M47817 Spondylosis without myelopathy or radiculopathy, lumbosacral region: Secondary | ICD-10-CM | POA: Diagnosis not present

## 2016-02-16 DIAGNOSIS — I25119 Atherosclerotic heart disease of native coronary artery with unspecified angina pectoris: Secondary | ICD-10-CM | POA: Diagnosis not present

## 2016-02-16 DIAGNOSIS — I69354 Hemiplegia and hemiparesis following cerebral infarction affecting left non-dominant side: Secondary | ICD-10-CM | POA: Diagnosis not present

## 2016-02-16 DIAGNOSIS — M170 Bilateral primary osteoarthritis of knee: Secondary | ICD-10-CM | POA: Diagnosis not present

## 2016-02-16 DIAGNOSIS — G20 Parkinson's disease: Secondary | ICD-10-CM | POA: Diagnosis not present

## 2016-02-16 DIAGNOSIS — F329 Major depressive disorder, single episode, unspecified: Secondary | ICD-10-CM | POA: Diagnosis not present

## 2016-02-23 DIAGNOSIS — H353132 Nonexudative age-related macular degeneration, bilateral, intermediate dry stage: Secondary | ICD-10-CM | POA: Diagnosis not present

## 2016-02-23 DIAGNOSIS — Z961 Presence of intraocular lens: Secondary | ICD-10-CM | POA: Diagnosis not present

## 2016-02-23 DIAGNOSIS — H25011 Cortical age-related cataract, right eye: Secondary | ICD-10-CM | POA: Diagnosis not present

## 2016-02-23 DIAGNOSIS — H2511 Age-related nuclear cataract, right eye: Secondary | ICD-10-CM | POA: Diagnosis not present

## 2016-02-23 DIAGNOSIS — H3562 Retinal hemorrhage, left eye: Secondary | ICD-10-CM | POA: Diagnosis not present

## 2016-02-26 ENCOUNTER — Other Ambulatory Visit: Payer: Self-pay | Admitting: Internal Medicine

## 2016-02-27 ENCOUNTER — Other Ambulatory Visit: Payer: Self-pay | Admitting: Internal Medicine

## 2016-03-01 DIAGNOSIS — L821 Other seborrheic keratosis: Secondary | ICD-10-CM | POA: Diagnosis not present

## 2016-03-01 DIAGNOSIS — L718 Other rosacea: Secondary | ICD-10-CM | POA: Diagnosis not present

## 2016-03-09 ENCOUNTER — Other Ambulatory Visit: Payer: Self-pay | Admitting: Internal Medicine

## 2016-03-09 ENCOUNTER — Telehealth: Payer: Self-pay | Admitting: Internal Medicine

## 2016-03-09 NOTE — Telephone Encounter (Signed)
Pt called in and said that she needs the nurse to give her a call back.  She missed placed on of her meds and needs some called in.  She wanted to talk to nurse asap.  She is going out of town Saturday

## 2016-03-10 NOTE — Telephone Encounter (Signed)
Left message for patient to call back  

## 2016-03-14 ENCOUNTER — Other Ambulatory Visit: Payer: Self-pay | Admitting: Internal Medicine

## 2016-03-15 ENCOUNTER — Other Ambulatory Visit: Payer: Self-pay | Admitting: Internal Medicine

## 2016-03-16 NOTE — Telephone Encounter (Signed)
Faxed to pharmacy

## 2016-03-23 ENCOUNTER — Other Ambulatory Visit: Payer: Self-pay | Admitting: Internal Medicine

## 2016-03-30 ENCOUNTER — Other Ambulatory Visit: Payer: Self-pay | Admitting: Internal Medicine

## 2016-03-30 NOTE — Telephone Encounter (Signed)
Faxed to pharmacy

## 2016-04-06 ENCOUNTER — Encounter: Payer: Medicare Other | Admitting: Internal Medicine

## 2016-04-06 DIAGNOSIS — H2511 Age-related nuclear cataract, right eye: Secondary | ICD-10-CM | POA: Diagnosis not present

## 2016-04-06 DIAGNOSIS — H25011 Cortical age-related cataract, right eye: Secondary | ICD-10-CM | POA: Diagnosis not present

## 2016-04-06 DIAGNOSIS — H25811 Combined forms of age-related cataract, right eye: Secondary | ICD-10-CM | POA: Diagnosis not present

## 2016-04-13 ENCOUNTER — Telehealth: Payer: Self-pay | Admitting: Emergency Medicine

## 2016-04-13 NOTE — Telephone Encounter (Signed)
Contacted patient, patient stated she had cataract surgery and that she told the nurse that she was on blood thinners, states awareness that she can use tylenol and ice and will call back if it gets worse

## 2016-04-13 NOTE — Telephone Encounter (Signed)
Pt called and stated her IV spot is bruised and swollen. She has been using ice but wants to know if you can give her a call back about this. Please advise thanks.

## 2016-04-13 NOTE — Telephone Encounter (Signed)
IV spot? Has she been to a hospital or ER recently? Okay to use ice and tylenol for pain. If worsening needs visit.

## 2016-04-27 ENCOUNTER — Inpatient Hospital Stay (HOSPITAL_COMMUNITY)
Admission: EM | Admit: 2016-04-27 | Discharge: 2016-04-30 | DRG: 065 | Disposition: A | Discharge status: Rehabilitation Facility | Payer: Medicare Other | Attending: Internal Medicine | Admitting: Internal Medicine

## 2016-04-27 ENCOUNTER — Emergency Department (HOSPITAL_COMMUNITY): Payer: Medicare Other

## 2016-04-27 ENCOUNTER — Observation Stay (HOSPITAL_COMMUNITY): Payer: Medicare Other

## 2016-04-27 ENCOUNTER — Encounter: Payer: Self-pay | Admitting: Internal Medicine

## 2016-04-27 ENCOUNTER — Ambulatory Visit: Payer: Medicare Other | Admitting: Family Medicine

## 2016-04-27 ENCOUNTER — Ambulatory Visit (INDEPENDENT_AMBULATORY_CARE_PROVIDER_SITE_OTHER): Payer: Medicare Other | Admitting: Internal Medicine

## 2016-04-27 ENCOUNTER — Encounter (HOSPITAL_COMMUNITY): Payer: Self-pay | Admitting: Family Medicine

## 2016-04-27 DIAGNOSIS — Z79899 Other long term (current) drug therapy: Secondary | ICD-10-CM

## 2016-04-27 DIAGNOSIS — F419 Anxiety disorder, unspecified: Secondary | ICD-10-CM | POA: Diagnosis not present

## 2016-04-27 DIAGNOSIS — R471 Dysarthria and anarthria: Secondary | ICD-10-CM | POA: Diagnosis not present

## 2016-04-27 DIAGNOSIS — Z881 Allergy status to other antibiotic agents status: Secondary | ICD-10-CM

## 2016-04-27 DIAGNOSIS — F329 Major depressive disorder, single episode, unspecified: Secondary | ICD-10-CM | POA: Diagnosis present

## 2016-04-27 DIAGNOSIS — H538 Other visual disturbances: Secondary | ICD-10-CM | POA: Diagnosis not present

## 2016-04-27 DIAGNOSIS — G20C Parkinsonism, unspecified: Secondary | ICD-10-CM | POA: Diagnosis present

## 2016-04-27 DIAGNOSIS — I739 Peripheral vascular disease, unspecified: Secondary | ICD-10-CM | POA: Diagnosis not present

## 2016-04-27 DIAGNOSIS — Z8673 Personal history of transient ischemic attack (TIA), and cerebral infarction without residual deficits: Secondary | ICD-10-CM

## 2016-04-27 DIAGNOSIS — E871 Hypo-osmolality and hyponatremia: Secondary | ICD-10-CM | POA: Diagnosis present

## 2016-04-27 DIAGNOSIS — Z86010 Personal history of colonic polyps: Secondary | ICD-10-CM

## 2016-04-27 DIAGNOSIS — I69354 Hemiplegia and hemiparesis following cerebral infarction affecting left non-dominant side: Secondary | ICD-10-CM | POA: Diagnosis not present

## 2016-04-27 DIAGNOSIS — Z888 Allergy status to other drugs, medicaments and biological substances status: Secondary | ICD-10-CM

## 2016-04-27 DIAGNOSIS — M1990 Unspecified osteoarthritis, unspecified site: Secondary | ICD-10-CM | POA: Diagnosis present

## 2016-04-27 DIAGNOSIS — I10 Essential (primary) hypertension: Secondary | ICD-10-CM | POA: Diagnosis present

## 2016-04-27 DIAGNOSIS — R29818 Other symptoms and signs involving the nervous system: Secondary | ICD-10-CM

## 2016-04-27 DIAGNOSIS — I459 Conduction disorder, unspecified: Secondary | ICD-10-CM | POA: Diagnosis present

## 2016-04-27 DIAGNOSIS — Z7902 Long term (current) use of antithrombotics/antiplatelets: Secondary | ICD-10-CM

## 2016-04-27 DIAGNOSIS — R4781 Slurred speech: Secondary | ICD-10-CM | POA: Diagnosis present

## 2016-04-27 DIAGNOSIS — I872 Venous insufficiency (chronic) (peripheral): Secondary | ICD-10-CM | POA: Diagnosis present

## 2016-04-27 DIAGNOSIS — I69391 Dysphagia following cerebral infarction: Secondary | ICD-10-CM

## 2016-04-27 DIAGNOSIS — E785 Hyperlipidemia, unspecified: Secondary | ICD-10-CM | POA: Diagnosis present

## 2016-04-27 DIAGNOSIS — I6930 Unspecified sequelae of cerebral infarction: Secondary | ICD-10-CM

## 2016-04-27 DIAGNOSIS — K219 Gastro-esophageal reflux disease without esophagitis: Secondary | ICD-10-CM | POA: Diagnosis present

## 2016-04-27 DIAGNOSIS — Z7952 Long term (current) use of systemic steroids: Secondary | ICD-10-CM

## 2016-04-27 DIAGNOSIS — G20 Parkinson's disease: Secondary | ICD-10-CM | POA: Diagnosis present

## 2016-04-27 DIAGNOSIS — I639 Cerebral infarction, unspecified: Secondary | ICD-10-CM | POA: Diagnosis not present

## 2016-04-27 DIAGNOSIS — I358 Other nonrheumatic aortic valve disorders: Secondary | ICD-10-CM | POA: Diagnosis present

## 2016-04-27 DIAGNOSIS — Z91040 Latex allergy status: Secondary | ICD-10-CM

## 2016-04-27 DIAGNOSIS — R29810 Facial weakness: Secondary | ICD-10-CM | POA: Diagnosis present

## 2016-04-27 DIAGNOSIS — M810 Age-related osteoporosis without current pathological fracture: Secondary | ICD-10-CM | POA: Diagnosis present

## 2016-04-27 LAB — COMPREHENSIVE METABOLIC PANEL
ALBUMIN: 3.5 g/dL (ref 3.5–5.0)
ALK PHOS: 46 U/L (ref 38–126)
ALT: 9 U/L — ABNORMAL LOW (ref 14–54)
ANION GAP: 8 (ref 5–15)
AST: 11 U/L — ABNORMAL LOW (ref 15–41)
BILIRUBIN TOTAL: 0.3 mg/dL (ref 0.3–1.2)
BUN: 15 mg/dL (ref 6–20)
CALCIUM: 9.2 mg/dL (ref 8.9–10.3)
CO2: 27 mmol/L (ref 22–32)
Chloride: 97 mmol/L — ABNORMAL LOW (ref 101–111)
Creatinine, Ser: 0.87 mg/dL (ref 0.44–1.00)
GFR calc Af Amer: >60 mL/min (ref >60)
GFR calc non Af Amer: 58 mL/min — ABNORMAL LOW (ref >60)
GLUCOSE: 115 mg/dL — AB (ref 65–99)
Potassium: 4.1 mmol/L (ref 3.5–5.1)
Sodium: 132 mmol/L — ABNORMAL LOW (ref 135–145)
TOTAL PROTEIN: 6.4 g/dL — AB (ref 6.5–8.1)

## 2016-04-27 LAB — I-STAT TROPONIN, ED: Troponin i, poc: 0 ng/mL (ref 0.00–0.08)

## 2016-04-27 LAB — I-STAT CHEM 8, ED
BUN: 17 mg/dL (ref 6–20)
CALCIUM ION: 1.14 mmol/L — AB (ref 1.15–1.40)
CREATININE: 0.8 mg/dL (ref 0.44–1.00)
Chloride: 94 mmol/L — ABNORMAL LOW (ref 101–111)
Glucose, Bld: 112 mg/dL — ABNORMAL HIGH (ref 65–99)
HCT: 40 % (ref 36.0–46.0)
Hemoglobin: 13.6 g/dL (ref 12.0–15.0)
Potassium: 4.1 mmol/L (ref 3.5–5.1)
Sodium: 132 mmol/L — ABNORMAL LOW (ref 135–145)
TCO2: 26 mmol/L (ref 0–100)

## 2016-04-27 LAB — URINALYSIS, ROUTINE W REFLEX MICROSCOPIC
Bilirubin Urine: NEGATIVE
Glucose, UA: NEGATIVE mg/dL
Hgb urine dipstick: NEGATIVE
KETONES UR: NEGATIVE mg/dL
Nitrite: NEGATIVE
PH: 7 (ref 5.0–8.0)
PROTEIN: NEGATIVE mg/dL
RBC / HPF: NONE SEEN RBC/hpf (ref 0–5)
Specific Gravity, Urine: 1.008 (ref 1.005–1.030)

## 2016-04-27 LAB — DIFFERENTIAL
Basophils Absolute: 0 10*3/uL (ref 0.0–0.1)
Basophils Relative: 0 %
EOS PCT: 1 %
Eosinophils Absolute: 0.1 10*3/uL (ref 0.0–0.7)
LYMPHS ABS: 1.7 10*3/uL (ref 0.7–4.0)
LYMPHS PCT: 28 %
Monocytes Absolute: 0.7 10*3/uL (ref 0.1–1.0)
Monocytes Relative: 12 %
Neutro Abs: 3.4 10*3/uL (ref 1.7–7.7)
Neutrophils Relative %: 59 %

## 2016-04-27 LAB — CBC
HEMATOCRIT: 38.8 % (ref 36.0–46.0)
HEMOGLOBIN: 12.4 g/dL (ref 12.0–15.0)
MCH: 29.8 pg (ref 26.0–34.0)
MCHC: 32 g/dL (ref 30.0–36.0)
MCV: 93.3 fL (ref 78.0–100.0)
Platelets: 297 10*3/uL (ref 150–400)
RBC: 4.16 MIL/uL (ref 3.87–5.11)
RDW: 13.9 % (ref 11.5–15.5)
WBC: 5.9 10*3/uL (ref 4.0–10.5)

## 2016-04-27 LAB — APTT: aPTT: 33 seconds (ref 24–36)

## 2016-04-27 LAB — PROTIME-INR
INR: 0.98
Prothrombin Time: 13 seconds (ref 11.4–15.2)

## 2016-04-27 MED ORDER — PANTOPRAZOLE SODIUM 40 MG PO TBEC
40.0000 mg | DELAYED_RELEASE_TABLET | Freq: Every day | ORAL | Status: DC
  Administered 2016-04-28 – 2016-04-30 (×3): 40 mg via ORAL
  Filled 2016-04-27 (×3): qty 1

## 2016-04-27 MED ORDER — ENOXAPARIN SODIUM 40 MG/0.4ML ~~LOC~~ SOLN
40.0000 mg | SUBCUTANEOUS | Status: DC
  Administered 2016-04-27 – 2016-04-29 (×3): 40 mg via SUBCUTANEOUS
  Filled 2016-04-27 (×3): qty 0.4

## 2016-04-27 MED ORDER — LORAZEPAM 2 MG/ML IJ SOLN: 1.0000 mg | INTRAMUSCULAR | Status: DC | PRN

## 2016-04-27 MED ORDER — NORTRIPTYLINE HCL 25 MG PO CAPS
25.0000 mg | ORAL_CAPSULE | Freq: Every day | ORAL | Status: DC
  Administered 2016-04-27 – 2016-04-29 (×3): 25 mg via ORAL
  Filled 2016-04-27 (×4): qty 1

## 2016-04-27 MED ORDER — ACETAMINOPHEN 650 MG RE SUPP: 650.0000 mg | RECTAL | Status: DC | PRN

## 2016-04-27 MED ORDER — LABETALOL HCL 5 MG/ML IV SOLN: 5.0000 mg | INTRAVENOUS | Status: DC | PRN

## 2016-04-27 MED ORDER — PERPHENAZINE 2 MG PO TABS: 2.0000 mg | ORAL_TABLET | Freq: Every day | ORAL | 2 refills | Status: DC

## 2016-04-27 MED ORDER — TRIAZOLAM 0.125 MG PO TABS: 0.1250 mg | ORAL_TABLET | Freq: Every evening | ORAL | Status: DC | PRN

## 2016-04-27 MED ORDER — SODIUM CHLORIDE 0.9 % IV SOLN
INTRAVENOUS | Status: DC
  Administered 2016-04-28: via INTRAVENOUS

## 2016-04-27 MED ORDER — STROKE: EARLY STAGES OF RECOVERY BOOK
Freq: Once | Status: AC
  Administered 2016-04-27: 22:00:00
  Filled 2016-04-27: qty 1

## 2016-04-27 MED ORDER — ALPRAZOLAM 1 MG PO TABS: 1.0000 mg | ORAL_TABLET | Freq: Two times a day (BID) | ORAL | 1 refills | Status: DC

## 2016-04-27 MED ORDER — SENNOSIDES-DOCUSATE SODIUM 8.6-50 MG PO TABS: 1.0000 | ORAL_TABLET | Freq: Every evening | ORAL | Status: DC | PRN

## 2016-04-27 MED ORDER — OFLOXACIN 0.3 % OP SOLN
1.0000 [drp] | Freq: Four times a day (QID) | OPHTHALMIC | Status: DC
  Administered 2016-04-27 – 2016-04-30 (×11): 1 [drp] via OPHTHALMIC
  Filled 2016-04-27: qty 5

## 2016-04-27 MED ORDER — CARBIDOPA-LEVODOPA 25-100 MG PO TABS
1.0000 | ORAL_TABLET | Freq: Three times a day (TID) | ORAL | Status: DC
  Administered 2016-04-27 – 2016-04-30 (×9): 1 via ORAL
  Filled 2016-04-27 (×9): qty 1

## 2016-04-27 MED ORDER — PRAVASTATIN SODIUM 20 MG PO TABS: ORAL_TABLET | ORAL | 3 refills | Status: DC

## 2016-04-27 MED ORDER — ACETAMINOPHEN 325 MG PO TABS
650.0000 mg | ORAL_TABLET | ORAL | Status: DC | PRN
  Administered 2016-04-27 – 2016-04-30 (×6): 650 mg via ORAL
  Filled 2016-04-27 (×6): qty 2

## 2016-04-27 MED ORDER — ALPRAZOLAM 0.25 MG PO TABS
1.0000 mg | ORAL_TABLET | Freq: Two times a day (BID) | ORAL | Status: DC
  Administered 2016-04-27 – 2016-04-29 (×4): 1 mg via ORAL
  Filled 2016-04-27 (×4): qty 4

## 2016-04-27 MED ORDER — PERPHENAZINE 2 MG PO TABS
2.0000 mg | ORAL_TABLET | Freq: Every day | ORAL | Status: DC
  Administered 2016-04-27 – 2016-04-29 (×3): 2 mg via ORAL
  Filled 2016-04-27 (×4): qty 1

## 2016-04-27 MED ORDER — ACETAMINOPHEN 160 MG/5ML PO SOLN: 650.0000 mg | ORAL | Status: DC | PRN

## 2016-04-27 MED ORDER — PREDNISOLONE ACETATE 1 % OP SUSP
1.0000 [drp] | OPHTHALMIC | Status: DC
  Administered 2016-04-27 – 2016-04-30 (×18): 1 [drp] via OPHTHALMIC
  Filled 2016-04-27: qty 1

## 2016-04-27 MED ORDER — CLOPIDOGREL BISULFATE 75 MG PO TABS
75.0000 mg | ORAL_TABLET | Freq: Every day | ORAL | Status: DC
  Administered 2016-04-28 – 2016-04-30 (×3): 75 mg via ORAL
  Filled 2016-04-27 (×3): qty 1

## 2016-04-27 MED ORDER — PRAVASTATIN SODIUM 10 MG PO TABS
10.0000 mg | ORAL_TABLET | Freq: Every day | ORAL | Status: DC
  Administered 2016-04-28 – 2016-04-29 (×2): 10 mg via ORAL
  Filled 2016-04-27 (×2): qty 1

## 2016-04-27 NOTE — Assessment & Plan Note (Signed)
New speech changes and facial weakness today so sending to ER instead of wellness exam to rule out stroke. She is also having some drooling on exam which is new.

## 2016-04-27 NOTE — ED Notes (Signed)
Patient has returned from CT

## 2016-04-27 NOTE — ED Provider Notes (Signed)
[place] DEPT Provider Note   CSN: [id number] Arrival date & time: 04/27/16  1614     History   Chief Complaint Chief Complaint  Patient presents with  . Code Stroke    HPI Sarah [name] is a 83 y.o. female.  HPI  Past Medical History:  Diagnosis Date  . Bladder incontinence   . Chest pain    S/P nuclear 07/17/2008  normal; EF 88% with no ischemia  . Chronic venous insufficiency    BLE edema, chronic, neg venous US 2/13  . Constipation    hx of  . Depression   . Diverticulitis   . Esophageal stricture    s/p dilation 04/2012  . GERD (gastroesophageal reflux disease)   . History of chicken pox   . Hyperlipidemia   . Hypertension   . Osteoarthritis   . Osteoporosis, postmenopausal 06/02/2011  . Polyp of colon   . Stroke [facility]) 07/03/2014    Patient Active Problem List   Diagnosis Date Noted  . Slurred speech 04/27/2016  . CVA (cerebral vascular accident) ([place]) 04/27/2016  . Hypertension 04/27/2016  . Pansinusitis 10/20/2015  . Primary Parkinsonism ([place]) 09/26/2015  . Peripheral edema 05/12/2015  . DOE (dyspnea on exertion) 02/12/2015  . Neck pain 01/10/2015  . Routine general medical examination at a health care facility 09/17/2014  . Hyperglycemia 07/09/2014  . Acute ischemic right MCA stroke ([place]) 07/03/2014  . Degenerative arthritis of knee, bilateral 03/01/2014  . Fall 10/05/2013  . Hyponatremia 10/05/2013  . Memory deficits 10/05/2013  . Arthritis of sacroiliac joint ([place]) 09/11/2013  . Lumbosacral spondylosis without myelopathy 02/26/2013  . Abnormality of gait 12/15/2012  . Right shoulder pain   . Vitamin B12 deficiency 04/13/2012  . Hypercholesterolemia 11/05/2011  . Osteoporosis, postmenopausal 06/02/2011  . Mixed incontinence urge and stress 06/02/2011  . Anxiety   . Edema 04/06/2011  . Tachycardia 04/06/2011  . Osteoarthritis     Past Surgical History:  Procedure Laterality Date  . APPENDECTOMY     age 59  . COLONOSCOPY    .  POLYPECTOMY    . TUBAL LIGATION    . UPPER GASTROINTESTINAL ENDOSCOPY      OB History    No data available       Home Medications    Prior to Admission medications   Medication Sig Start Date End Date Taking? Authorizing Provider  acetaminophen (TYLENOL) 500 MG tablet Take 1 tablet (500 mg total) by mouth every 8 (eight) hours as needed (for pain). 08/09/14  Yes Darlin Coco, MD  ALPRAZolam Duanne Moron) 1 MG tablet Take 1 tablet (1 mg total) by mouth 2 (two) times daily. 04/27/16  Yes Hoyt Koch, MD  carbidopa-levodopa (SINEMET IR) 25-100 MG tablet TAKE 1 TABLET 3 TIMES A DAY. 02/04/16  Yes Donika Keith Rake, DO  Cholecalciferol (VITAMIN D PO) Take 1 tablet by mouth daily.    Yes Historical Provider, MD  clopidogrel (PLAVIX) 75 MG tablet Take 1 tablet (75 mg total) by mouth daily. 11/10/15  Yes Jerline Pain, MD  diclofenac sodium (VOLTAREN) 1 % GEL Apply  To feet 3 times daily prn for pain 05/19/15  Yes Hoyt Koch, MD  furosemide (LASIX) 20 MG tablet TAKE 1 TABLET DAILY AS NEEDED. Patient taking differently: TAKE 1 TABLET DAILY AS NEEDED FOR EDEMA 05/23/15  Yes Hoyt Koch, MD  Glucosamine HCl 750 MG TABS Take 1 tablet by mouth 2 (two) times daily.    Yes Historical Provider, MD  Britt Bottom  0.25 MG tablet TAKE 1/2 TABLET AT BEDTIME AS NEEDED FOR SLEEP. Patient taking differently: TAKE 0.125mg  AT BEDTIME FOR SLEEP. 03/16/16  Yes Hoyt Koch, MD  Homeopathic Products (ARNICARE ARNICA EX) Apply 1 application topically daily as needed (pain).   Yes Historical Provider, MD  ibuprofen (ADVIL,MOTRIN) 200 MG tablet Take 200 mg by mouth every 6 (six) hours as needed for headache.   Yes Historical Provider, MD  nitroGLYCERIN (NITROSTAT) 0.4 MG SL tablet Place 1 tablet (0.4 mg total) under the tongue every 5 (five) minutes as needed for chest pain. 05/26/15  Yes Darlin Coco, MD  nortriptyline (PAMELOR) 25 MG capsule TAKE 1 CAPSULE AT BEDTIME. 03/24/16  Yes Hoyt Koch, MD  ofloxacin (OCUFLOX) 0.3 % ophthalmic solution Place 1 drop into the right eye 4 (four) times daily.   Yes Historical Provider, MD  pantoprazole (PROTONIX) 40 MG tablet TAKE 1 TABLET ONCE DAILY. 08/19/15  Yes Irene Shipper, MD  perphenazine (TRILAFON) 2 MG tablet Take 1 tablet (2 mg total) by mouth at bedtime. 04/27/16  Yes Hoyt Koch, MD  pravastatin (PRAVACHOL) 20 MG tablet TAKE (1/2) TABLET DAILY. 04/27/16  Yes Hoyt Koch, MD  prednisoLONE acetate (PRED FORTE) 1 % ophthalmic suspension Place 1 drop into the right eye every 2 (two) hours while awake.   Yes Historical Provider, MD  VESICARE 10 MG tablet TAKE (1) TABLET DAILY AS NEEDED. Patient taking differently: TAKE (1) TABLET DAILY AS NEEDED FOR BLADDER 03/10/16  Yes Hoyt Koch, MD    Family History Family History  Problem Relation Age of Onset  . Cancer Son     testicular cancer  . Breast cancer Sister   . Skin cancer Sister   . Colon cancer Neg Hx   . Esophageal cancer Neg Hx   . Rectal cancer Neg Hx   . Stomach cancer Neg Hx     Social History Social History  Substance Use Topics  . Smoking status: Never Smoker  . Smokeless tobacco: Never Used  . Alcohol use No     Allergies   Lipitor [atorvastatin calcium]; Lovastatin; Celebrex [celecoxib]; Latex; Macrobid [nitrofurantoin monohyd macro]; and Other   Review of Systems Review of Systems   Physical Exam Updated Vital Signs BP 168/74   Pulse 87   Temp 97.6 F (36.4 C) (Oral)   Resp 21   SpO2 95%   Physical Exam  Constitutional: She is oriented to person, place, and time. She appears well-developed and well-nourished. No distress.  HENT:  Head: Normocephalic and atraumatic.  Mouth/Throat: Oropharynx is clear and moist.  Eyes: Conjunctivae and EOM are normal. Pupils are equal, round, and reactive to light.  Neck: Neck supple.  Cardiovascular: Normal rate and regular rhythm.   Pulmonary/Chest: Effort normal and breath  sounds normal. No respiratory distress.  Abdominal: Soft. Bowel sounds are normal. There is no tenderness.  Musculoskeletal: Normal range of motion. She exhibits no edema.  Neurological: She is alert and oriented to person, place, and time. A cranial nerve deficit is present.  Patient extremely motor strength appears to be normal. Patient has a slight right facial droop. Speech according to daughter is still a little off. The patient able to verbalize fairly well.  Skin: Skin is warm.  Nursing note and vitals reviewed.    ED Treatments / Results  Labs (all labs ordered are listed, but only abnormal results are displayed) Labs Reviewed  COMPREHENSIVE METABOLIC PANEL - Abnormal; Notable for the following:  Result Value   Sodium 132 (*)    Chloride 97 (*)    Glucose, Bld 115 (*)    Total Protein 6.4 (*)    AST 11 (*)    ALT 9 (*)    GFR calc non Af Amer 58 (*)    All other components within normal limits  I-STAT CHEM 8, ED - Abnormal; Notable for the following:    Sodium 132 (*)    Chloride 94 (*)    Glucose, Bld 112 (*)    Calcium, Ion 1.14 (*)    All other components within normal limits  PROTIME-INR  APTT  CBC  DIFFERENTIAL  I-STAT TROPOININ, ED  CBG MONITORING, ED    EKG  EKG Interpretation  Date/Time:  Tuesday April 27 2016 16:40:55 EST Ventricular Rate:  87 PR Interval:    QRS Duration: 82 QT Interval:  374 QTC Calculation: 450 R Axis:   29 Text Interpretation:  Sinus rhythm Abnormal R-wave progression, early transition No significant change since last tracing Confirmed by Chrisopher Pustejovsky  MD, [place] [phone]) on 04/27/2016 4:49:53 PM       Radiology Ct Head Code Stroke W/o Cm  Result Date: 04/27/2016 CLINICAL DATA:  Code stroke. Slurred speech. Intermittent ruling. Right arm weakness. EXAM: CT HEAD WITHOUT CONTRAST TECHNIQUE: Contiguous axial images were obtained from the base of the skull through the vertex without intravenous contrast. COMPARISON:   08/29/2014 FINDINGS: Brain: Generalized brain atrophy. Confluent chronic small vessel ischemic changes affecting the cerebral hemispheric white matter. No evidence of cortical infarction. No mass lesion, hemorrhage, hydrocephalus or extra-axial collection. Calcified meningioma of the sphenoid wing on the right as seen previously. Vascular: There is atherosclerotic calcification of the major vessels at the base of the brain. No acute hyperdense vessel identified. Skull: Negative Sinuses/Orbits: Clear/normal Other: None significant ASPECTS ([place] Stroke Program Early CT Score) - Ganglionic level infarction (caudate, lentiform nuclei, internal capsule, insula, M1-M3 cortex): 7 - Supraganglionic infarction (M4-M6 cortex): 3 Total score (0-10 with 10 being normal): 10 IMPRESSION: 1. No acute finding by CT. Widespread chronic small-vessel ischemic changes. Right sphenoid wing meningioma as seen previously. 2. ASPECTS is 10 These results were called by telephone at the time of interpretation on 04/27/2016 at 4:36 pm to Dr. Leonel Ramsay, who verbally acknowledged these results. Electronically Signed   By: Nelson Chimes M.D.   On: 04/27/2016 16:37    Procedures Procedures (including critical care time)  CRITICAL CARE Performed by: Fredia Sorrow Total critical care time: 30 minutes Critical care time was exclusive of separately billable procedures and treating other patients. Critical care was necessary to treat or prevent imminent or life-threatening deterioration. Critical care was time spent personally by me on the following activities: development of treatment plan with patient and/or surrogate as well as nursing, discussions with consultants, evaluation of patient's response to treatment, examination of patient, obtaining history from patient or surrogate, ordering and performing treatments and interventions, ordering and review of laboratory studies, ordering and review of radiographic studies, pulse  oximetry and re-evaluation of patient's condition.   Medications Ordered in ED Medications - No data to display   Initial Impression / Assessment and Plan / ED Course  I have reviewed the triage vital signs and the nursing notes.  Pertinent labs & imaging results that were available during my care of the patient were reviewed by me and considered in my medical decision making (see chart for details).   patient arrived as a code stroke from the primary care doctor's office.  At approximately 12:30 or perhaps closer to 1 PM patient developed speech problem and was noted to have a right facial droop. Patient was taken immediately to head CT which showed no acute findings. Seen by neurology. They agreed that she probably did have a small stroke was not a candidate for TPA. Patient's speech was showing signs of improvement at this point. Recommended admission to the hospitalist service. Patient has continued to do fine. Still has a slight right facial droop. Speech seems to be back to baseline as per daughter. Patient's had a history of stroke in the past.  Patient has been formally admitted to the hospitalist service.      Final Clinical Impressions(s) / ED Diagnoses   Final diagnoses:  Cerebrovascular accident (CVA), unspecified mechanism ([place])    New Prescriptions New Prescriptions   No medications on file     Fredia Sorrow, MD 04/27/16 [id number]

## 2016-04-27 NOTE — Progress Notes (Signed)
Pre visit review using our clinic review tool, if applicable. No additional management support is needed unless otherwise documented below in the visit note. 

## 2016-04-27 NOTE — Code Documentation (Signed)
83 y.o. female arrives to [facility] ED via POV with her caregiver. Per her caregiver, the patient was noticed to have speech changes that started around breakfast time this morning. She had a stroke about 2 years ago and does have some residual facial droop on left side lips. She was taken to her PCP today and was told to go directly to the ED. C/o dysarthria, drooling and right sided hemiparesis. Upon arrival to [facility] ED a code stroke was called. Labs drawn and sent. CT head completed and shows no acute finding. Widespread chronic small-vessel ischemic changes. Right sphenoid wing meningioma as seen previously. ASPECTS is 10. NIHSS 2. See EMR for NIHSS and code stroke times. NIHSS 2 for right sided facial droop and mild dysarthria. Bedside handoff with ED RN Carlis Abbott.

## 2016-04-27 NOTE — Patient Instructions (Signed)
We would like you to go to the emergency room to get checked for stroke due to the speech changes today.

## 2016-04-27 NOTE — ED Notes (Signed)
Patient undressed, in gown, on monitor, continuous pulse oximetry and blood pressure cuff 

## 2016-04-27 NOTE — Progress Notes (Signed)
   Subjective:    Patient ID: Sarah [name], female    DOB: 12-04-1926, 83 y.o.   MRN: [id number]  HPI The patient is an 83 YO female coming in for wellness. She is however having an acute change today which is more important. She is having speech changes since after breakfast this morning. Her caretaker is with her and noticed the change after breakfast. She had stroke about 2 years ago and does have some residual facial droop on left side lips. She is having no new weakness. She denies numbness in her arms or legs. Not in her face as well. Also having new drooling.   Review of Systems  Constitutional: Negative for activity change, appetite change, fatigue, fever and unexpected weight change.  HENT: Positive for drooling. Negative for dental problem, ear discharge, ear pain, nosebleeds, postnasal drip, rhinorrhea, sore throat and trouble swallowing.        Speech change  Eyes: Negative.   Respiratory: Negative.   Cardiovascular: Negative.   Gastrointestinal: Negative.   Musculoskeletal: Positive for gait problem.  Skin: Negative.   Neurological: Positive for speech difficulty and weakness. Negative for dizziness, syncope, facial asymmetry, light-headedness, numbness and headaches.       Weakness not new      Objective:   Physical Exam  Constitutional: She is oriented to person, place, and time. She appears well-developed and well-nourished.  HENT:  Head: Normocephalic and atraumatic.  Right Ear: External ear normal.  Left Ear: External ear normal.  Nose: Nose normal.  Mouth/Throat: Oropharynx is clear and moist.  Eyes: EOM are normal.  Neck: Normal range of motion.  Cardiovascular: Normal rate and regular rhythm.   Pulmonary/Chest: Effort normal and breath sounds normal.  Abdominal: Soft. She exhibits no distension. There is no tenderness. There is no rebound.  Musculoskeletal: She exhibits no edema.  Neurological: She is alert and oriented to person, place, and time. A cranial  nerve deficit is present. Coordination abnormal.  New weakness of the chin muscles, facial droop possibly worse today, new slurring of the speech, no facial numbness. Arms and legs are stable from prior exam, some weakness more of the left side than right which is not new.  Skin: Skin is warm and dry.  Psychiatric: She has a normal mood and affect.   Vitals:   04/27/16 1504  BP: 130/70  Pulse: 86  Resp: 12  SpO2: 95%  Weight: 167 lb (75.8 kg)  Height: 5' (1.524 m)      Assessment & Plan:

## 2016-04-27 NOTE — H&P (Signed)
History and Physical    Sarah [name] [id number] DOB: 09/18/1926 DOA: 04/27/2016  PCP: Hoyt Koch, MD   Patient coming from: Home, by way of PCP clinic   Chief Complaint: Acute slurred speech and drooling  HPI: Sarah [name] is a 83 y.o. female with medical history significant for Parkinson's, stroke with residual weakness, depression, and anxiety, now presenting to the emergency department at the direction of her PCP for evaluation of acute slurred speech and drooling. Patient has history of CVA with residual left-sided weakness and chronic left facial droop, but was noted this morning after breakfast to develop slurring of her speech and drooling. She was taken to see her primary care physician today for a general wellness exam, but her PCP was more concerned with the new slurring of speech and new drooling. Neurological exam was otherwise stable for the PCP. Patient denies any recent illness and reports that she felt well upon waking. She is aware of her speech difficulty earlier today, and feels it is resolving. She denies any recent fevers or chills, cough or dyspnea, dysuria, flank pain, abdominal pain, nausea, vomiting, or diarrhea. There is been no chest pain or palpitations. She denies headache or neck pain.  ED Course: Upon arrival to the ED, patient is found to be afebrile, saturating well on room air, hypertensive to 190/70, and with vitals otherwise stable. EKG features a sinus rhythm with early transition and noncontrast head CT is negative for acute intracranial abnormality. Chemistry panel reveals a mild hyponatremia and hypochloremia and CBC is unremarkable. Troponin is undetectable and INR is within the normal limits. Neurology was consulted by the ED physician, suspects her symptoms are likely a result of acute CVA, but given the apparent resolution of the new deficits, tPA was not given. Patient has remained hemodynamically stable and in no apparent respiratory  distress in the emergency department. She will be observed on telemetry unit for ongoing evaluation and management of transient slurred speech and drooling concerning for CVA/TIA.  Review of Systems:  All other systems reviewed and apart from HPI, are negative.  Past Medical History:  Diagnosis Date  . Bladder incontinence   . Chest pain    S/P nuclear 07/17/2008  normal; EF 88% with no ischemia  . Chronic venous insufficiency    BLE edema, chronic, neg venous US 2/13  . Constipation    hx of  . Depression   . Diverticulitis   . Esophageal stricture    s/p dilation 04/2012  . GERD (gastroesophageal reflux disease)   . History of chicken pox   . Hyperlipidemia   . Hypertension   . Osteoarthritis   . Osteoporosis, postmenopausal 06/02/2011  . Polyp of colon   . Stroke [facility]) 07/03/2014    Past Surgical History:  Procedure Laterality Date  . APPENDECTOMY     age 89  . COLONOSCOPY    . POLYPECTOMY    . TUBAL LIGATION    . UPPER GASTROINTESTINAL ENDOSCOPY       reports that she has never smoked. She has never used smokeless tobacco. She reports that she does not drink alcohol or use drugs.  Allergies  Allergen Reactions  . Lipitor [Atorvastatin Calcium] Other (See Comments)    Leg weakness,memory loss  . Lovastatin Other (See Comments)    Leg weakness  . Celebrex [Celecoxib] Itching and Rash  . Latex Rash  . Macrobid [employer] Macro] Other (See Comments)    Unknown per patient  . Other Other (  See Comments)    Always pads    Family History  Problem Relation Age of Onset  . Cancer Son     testicular cancer  . Breast cancer Sister   . Skin cancer Sister   . Colon cancer Neg Hx   . Esophageal cancer Neg Hx   . Rectal cancer Neg Hx   . Stomach cancer Neg Hx      Prior to Admission medications   Medication Sig Start Date End Date Taking? Authorizing Provider  acetaminophen (TYLENOL) 500 MG tablet Take 1 tablet (500 mg total) by mouth every 8 (eight)  hours as needed (for pain). 08/09/14  Yes Darlin Coco, MD  ALPRAZolam Duanne Moron) 1 MG tablet Take 1 tablet (1 mg total) by mouth 2 (two) times daily. 04/27/16  Yes Hoyt Koch, MD  carbidopa-levodopa (SINEMET IR) 25-100 MG tablet TAKE 1 TABLET 3 TIMES A DAY. 02/04/16  Yes Donika Keith Rake, DO  Cholecalciferol (VITAMIN D PO) Take 1 tablet by mouth daily.    Yes Historical Provider, MD  clopidogrel (PLAVIX) 75 MG tablet Take 1 tablet (75 mg total) by mouth daily. 11/10/15  Yes Jerline Pain, MD  diclofenac sodium (VOLTAREN) 1 % GEL Apply  To feet 3 times daily prn for pain 05/19/15  Yes Hoyt Koch, MD  furosemide (LASIX) 20 MG tablet TAKE 1 TABLET DAILY AS NEEDED. Patient taking differently: TAKE 1 TABLET DAILY AS NEEDED FOR EDEMA 05/23/15  Yes Hoyt Koch, MD  Glucosamine HCl 750 MG TABS Take 1 tablet by mouth 2 (two) times daily.    Yes Historical Provider, MD  HALCION 0.25 MG tablet TAKE 1/2 TABLET AT BEDTIME AS NEEDED FOR SLEEP. Patient taking differently: TAKE 0.125mg  AT BEDTIME FOR SLEEP. 03/16/16  Yes Hoyt Koch, MD  Homeopathic Products (ARNICARE ARNICA EX) Apply 1 application topically daily as needed (pain).   Yes Historical Provider, MD  ibuprofen (ADVIL,MOTRIN) 200 MG tablet Take 200 mg by mouth every 6 (six) hours as needed for headache.   Yes Historical Provider, MD  nitroGLYCERIN (NITROSTAT) 0.4 MG SL tablet Place 1 tablet (0.4 mg total) under the tongue every 5 (five) minutes as needed for chest pain. 05/26/15  Yes Darlin Coco, MD  nortriptyline (PAMELOR) 25 MG capsule TAKE 1 CAPSULE AT BEDTIME. 03/24/16  Yes Hoyt Koch, MD  ofloxacin (OCUFLOX) 0.3 % ophthalmic solution Place 1 drop into the right eye 4 (four) times daily.   Yes Historical Provider, MD  pantoprazole (PROTONIX) 40 MG tablet TAKE 1 TABLET ONCE DAILY. 08/19/15  Yes Irene Shipper, MD  perphenazine (TRILAFON) 2 MG tablet Take 1 tablet (2 mg total) by mouth at bedtime. 04/27/16  Yes  Hoyt Koch, MD  pravastatin (PRAVACHOL) 20 MG tablet TAKE (1/2) TABLET DAILY. 04/27/16  Yes Hoyt Koch, MD  prednisoLONE acetate (PRED FORTE) 1 % ophthalmic suspension Place 1 drop into the right eye every 2 (two) hours while awake.   Yes Historical Provider, MD  VESICARE 10 MG tablet TAKE (1) TABLET DAILY AS NEEDED. Patient taking differently: TAKE (1) TABLET DAILY AS NEEDED FOR BLADDER 03/10/16  Yes Hoyt Koch, MD    Physical Exam: Vitals:   04/27/16 1730 04/27/16 1800 04/27/16 1830 04/27/16 1930  BP: 162/60 140/90 168/74 146/65  Pulse: 85 90 87 93  Resp: 19 18 21 20   Temp:      TempSrc:      SpO2: 99% 100% 95% 99%      Constitutional:  NAD, calm, comfortable Eyes: PERTLA, lids and conjunctivae normal ENMT: Mucous membranes are moist. Posterior pharynx clear of any exudate or lesions.   Neck: normal, supple, no masses, no thyromegaly Respiratory: clear to auscultation bilaterally, no wheezing, no crackles. Normal respiratory effort.    Cardiovascular: S1 & S2 heard, regular rate and rhythm. 1+ pretibial edema bilaterally. No significant JVD. Abdomen: No distension, no tenderness, no masses palpated. Bowel sounds normal.  Musculoskeletal: no clubbing / cyanosis. No joint deformity upper and lower extremities. Normal muscle tone.  Skin: no significant rashes, lesions, ulcers. Warm, dry, well-perfused. Neurologic: Lower left facial weakness. Slight dysarthria. Sensation intact throughout face and distal extremities x4. Coarse resting tremor involving bilateral UE's.   Psychiatric: Normal judgment and insight. Alert and oriented x 3. Normal mood and affect.     Labs on Admission: I have personally reviewed following labs and imaging studies  CBC:  Recent Labs Lab 04/27/16 1631 04/27/16 1638  WBC 5.9  --   NEUTROABS 3.4  --   HGB 12.4 13.6  HCT 38.8 40.0  MCV 93.3  --   PLT 297  --    Basic Metabolic Panel:  Recent Labs Lab 04/27/16 1631  04/27/16 1638  NA 132* 132*  K 4.1 4.1  CL 97* 94*  CO2 27  --   GLUCOSE 115* 112*  BUN 15 17  CREATININE 0.87 0.80  CALCIUM 9.2  --    GFR: Estimated Creatinine Clearance: 45.1 mL/min (by C-G formula based on SCr of 0.8 mg/dL). Liver Function Tests:  Recent Labs Lab 04/27/16 1631  AST 11*  ALT 9*  ALKPHOS 46  BILITOT 0.3  PROT 6.4*  ALBUMIN 3.5   No results for input(s): LIPASE, AMYLASE in the last 168 hours. No results for input(s): AMMONIA in the last 168 hours. Coagulation Profile:  Recent Labs Lab 04/27/16 1631  INR 0.98   Cardiac Enzymes: No results for input(s): CKTOTAL, CKMB, CKMBINDEX, TROPONINI in the last 168 hours. BNP (last 3 results) No results for input(s): PROBNP in the last 8760 hours. HbA1C: No results for input(s): HGBA1C in the last 72 hours. CBG: No results for input(s): GLUCAP in the last 168 hours. Lipid Profile: No results for input(s): CHOL, HDL, LDLCALC, TRIG, CHOLHDL, LDLDIRECT in the last 72 hours. Thyroid Function Tests: No results for input(s): TSH, T4TOTAL, FREET4, T3FREE, THYROIDAB in the last 72 hours. Anemia Panel: No results for input(s): VITAMINB12, FOLATE, FERRITIN, TIBC, IRON, RETICCTPCT in the last 72 hours. Urine analysis:    Component Value Date/Time   COLORURINE YELLOW 07/04/2014 0945   APPEARANCEUR CLEAR 07/04/2014 0945   LABSPEC 1.009 07/04/2014 0945   PHURINE 7.5 07/04/2014 0945   GLUCOSEU NEGATIVE 07/04/2014 0945   HGBUR NEGATIVE 07/04/2014 0945   BILIRUBINUR NEGATIVE 07/04/2014 0945   KETONESUR NEGATIVE 07/04/2014 0945   PROTEINUR NEGATIVE 07/04/2014 0945   UROBILINOGEN 0.2 07/04/2014 0945   NITRITE NEGATIVE 07/04/2014 0945   LEUKOCYTESUR NEGATIVE 07/04/2014 0945   Sepsis Labs: @LABRCNTIP (procalcitonin:4,lacticidven:4) )No results found for this or any previous visit (from the past 240 hour(s)).   Radiological Exams on Admission: Ct Head Code Stroke W/o Cm  Result Date: 04/27/2016 CLINICAL DATA:   Code stroke. Slurred speech. Intermittent ruling. Right arm weakness. EXAM: CT HEAD WITHOUT CONTRAST TECHNIQUE: Contiguous axial images were obtained from the base of the skull through the vertex without intravenous contrast. COMPARISON:  08/29/2014 FINDINGS: Brain: Generalized brain atrophy. Confluent chronic small vessel ischemic changes affecting the cerebral hemispheric white matter. No evidence of  cortical infarction. No mass lesion, hemorrhage, hydrocephalus or extra-axial collection. Calcified meningioma of the sphenoid wing on the right as seen previously. Vascular: There is atherosclerotic calcification of the major vessels at the base of the brain. No acute hyperdense vessel identified. Skull: Negative Sinuses/Orbits: Clear/normal Other: None significant ASPECTS ([place] Stroke Program Early CT Score) - Ganglionic level infarction (caudate, lentiform nuclei, internal capsule, insula, M1-M3 cortex): 7 - Supraganglionic infarction (M4-M6 cortex): 3 Total score (0-10 with 10 being normal): 10 IMPRESSION: 1. No acute finding by CT. Widespread chronic small-vessel ischemic changes. Right sphenoid wing meningioma as seen previously. 2. ASPECTS is 10 These results were called by telephone at the time of interpretation on 04/27/2016 at 4:36 pm to Dr. Leonel Ramsay, who verbally acknowledged these results. Electronically Signed   By: Nelson Chimes M.D.   On: 04/27/2016 16:37    EKG: Independently reviewed. Sinus rhythm, early R-transition   Assessment/Plan  1. Acute dysarthria, drooling  - Pt with residual left-sided motor weakness from ischemic left MCA infarct in April 2016  - Noted this am after breakfast to have new dysarthria and drooling; she saw PCP for a regularly-scheduled appt in the afternoon and was directed to ED  - Primary concern is for recurrent CVA  - Head CT negative for acute changes  - Per pt's daughter, new deficits resolved in ED and pt at baseline neurologically  - Neurology is  consulting and much appreciated; tPA not given as sxs were resolving  - Monitor on telemetry with frequent neuro checks, PT/OT/SLP evals  - NPO pending swallow eval  - Check carotid dopplers, TTE, MRI/MRA, fasting lipids, A1c - Prophylactic aspirin precluded by stated allergy  - Continue Plavix and statin    2. Hyponatremia  - Serum sodium 132 on admission  - Possibly secondary to nortriptyline and/or perphenazine - This appears to be chronic and stable    3. Hypertension  - BP elevated to 190/70 on admission in setting of suspected ischemic CVA  - Plan to permit HTN to [id number] for now in the acute phase with MRI pending; will defer to neuro if lower target advised    4. Hx of CVA  - Left MCA ischemic infarct in April 2016 with residual left-sided motor weakness  - Continue Plavix and statin    5. Anxiety, depression  - Appears to be stable  - Continue nortriptyline, perphenazine, and prn triazolam    6. Parkinsonism  - Pt tremulous on admission  - Neuro is consulting and much appreciated  - Continue Sinemet     DVT prophylaxis: sq Lovenox  Code Status: Full  Family Communication: Discussed with patient Disposition Plan: Observe on telemetry Consults called: Neurology Admission status: Observation    Vianne Bulls, MD [facility] Pager [phone]  If 7PM-7AM, please contact night-coverage [email or web address] Password [email or web address]  04/27/2016, 8:00 PM

## 2016-04-28 ENCOUNTER — Observation Stay (HOSPITAL_COMMUNITY): Payer: Medicare Other

## 2016-04-28 ENCOUNTER — Encounter (HOSPITAL_COMMUNITY): Payer: Self-pay | Admitting: *Deleted

## 2016-04-28 ENCOUNTER — Ambulatory Visit: Payer: Medicare Other | Admitting: Family Medicine

## 2016-04-28 DIAGNOSIS — I6930 Unspecified sequelae of cerebral infarction: Secondary | ICD-10-CM | POA: Diagnosis not present

## 2016-04-28 DIAGNOSIS — R05 Cough: Secondary | ICD-10-CM | POA: Diagnosis not present

## 2016-04-28 DIAGNOSIS — K5900 Constipation, unspecified: Secondary | ICD-10-CM | POA: Diagnosis present

## 2016-04-28 DIAGNOSIS — H6121 Impacted cerumen, right ear: Secondary | ICD-10-CM | POA: Diagnosis not present

## 2016-04-28 DIAGNOSIS — G43A1 Cyclical vomiting, intractable: Secondary | ICD-10-CM | POA: Diagnosis not present

## 2016-04-28 DIAGNOSIS — G441 Vascular headache, not elsewhere classified: Secondary | ICD-10-CM | POA: Diagnosis not present

## 2016-04-28 DIAGNOSIS — Z7952 Long term (current) use of systemic steroids: Secondary | ICD-10-CM | POA: Diagnosis not present

## 2016-04-28 DIAGNOSIS — I69391 Dysphagia following cerebral infarction: Secondary | ICD-10-CM

## 2016-04-28 DIAGNOSIS — F419 Anxiety disorder, unspecified: Secondary | ICD-10-CM | POA: Diagnosis not present

## 2016-04-28 DIAGNOSIS — M810 Age-related osteoporosis without current pathological fracture: Secondary | ICD-10-CM | POA: Diagnosis present

## 2016-04-28 DIAGNOSIS — I639 Cerebral infarction, unspecified: Secondary | ICD-10-CM

## 2016-04-28 DIAGNOSIS — R29810 Facial weakness: Secondary | ICD-10-CM | POA: Diagnosis present

## 2016-04-28 DIAGNOSIS — Z91040 Latex allergy status: Secondary | ICD-10-CM | POA: Diagnosis not present

## 2016-04-28 DIAGNOSIS — M1990 Unspecified osteoarthritis, unspecified site: Secondary | ICD-10-CM | POA: Diagnosis present

## 2016-04-28 DIAGNOSIS — R269 Unspecified abnormalities of gait and mobility: Secondary | ICD-10-CM | POA: Diagnosis present

## 2016-04-28 DIAGNOSIS — G20 Parkinson's disease: Secondary | ICD-10-CM | POA: Diagnosis not present

## 2016-04-28 DIAGNOSIS — I69322 Dysarthria following cerebral infarction: Secondary | ICD-10-CM | POA: Diagnosis not present

## 2016-04-28 DIAGNOSIS — R110 Nausea: Secondary | ICD-10-CM | POA: Diagnosis not present

## 2016-04-28 DIAGNOSIS — R300 Dysuria: Secondary | ICD-10-CM | POA: Diagnosis not present

## 2016-04-28 DIAGNOSIS — F411 Generalized anxiety disorder: Secondary | ICD-10-CM | POA: Diagnosis not present

## 2016-04-28 DIAGNOSIS — I63312 Cerebral infarction due to thrombosis of left middle cerebral artery: Secondary | ICD-10-CM | POA: Diagnosis not present

## 2016-04-28 DIAGNOSIS — I739 Peripheral vascular disease, unspecified: Secondary | ICD-10-CM | POA: Diagnosis present

## 2016-04-28 DIAGNOSIS — Z7902 Long term (current) use of antithrombotics/antiplatelets: Secondary | ICD-10-CM | POA: Diagnosis not present

## 2016-04-28 DIAGNOSIS — R471 Dysarthria and anarthria: Secondary | ICD-10-CM | POA: Diagnosis not present

## 2016-04-28 DIAGNOSIS — I10 Essential (primary) hypertension: Secondary | ICD-10-CM | POA: Diagnosis not present

## 2016-04-28 DIAGNOSIS — B9620 Unspecified Escherichia coli [E. coli] as the cause of diseases classified elsewhere: Secondary | ICD-10-CM | POA: Diagnosis not present

## 2016-04-28 DIAGNOSIS — Z8673 Personal history of transient ischemic attack (TIA), and cerebral infarction without residual deficits: Secondary | ICD-10-CM | POA: Diagnosis not present

## 2016-04-28 DIAGNOSIS — Z881 Allergy status to other antibiotic agents status: Secondary | ICD-10-CM | POA: Diagnosis not present

## 2016-04-28 DIAGNOSIS — E871 Hypo-osmolality and hyponatremia: Secondary | ICD-10-CM | POA: Diagnosis not present

## 2016-04-28 DIAGNOSIS — R4781 Slurred speech: Secondary | ICD-10-CM | POA: Diagnosis present

## 2016-04-28 DIAGNOSIS — Z888 Allergy status to other drugs, medicaments and biological substances status: Secondary | ICD-10-CM | POA: Diagnosis not present

## 2016-04-28 DIAGNOSIS — I63511 Cerebral infarction due to unspecified occlusion or stenosis of right middle cerebral artery: Secondary | ICD-10-CM | POA: Diagnosis not present

## 2016-04-28 DIAGNOSIS — Z86010 Personal history of colonic polyps: Secondary | ICD-10-CM | POA: Diagnosis not present

## 2016-04-28 DIAGNOSIS — K219 Gastro-esophageal reflux disease without esophagitis: Secondary | ICD-10-CM | POA: Diagnosis present

## 2016-04-28 DIAGNOSIS — M545 Low back pain: Secondary | ICD-10-CM | POA: Diagnosis present

## 2016-04-28 DIAGNOSIS — K5901 Slow transit constipation: Secondary | ICD-10-CM | POA: Diagnosis not present

## 2016-04-28 DIAGNOSIS — N390 Urinary tract infection, site not specified: Secondary | ICD-10-CM | POA: Diagnosis not present

## 2016-04-28 DIAGNOSIS — F329 Major depressive disorder, single episode, unspecified: Secondary | ICD-10-CM | POA: Diagnosis present

## 2016-04-28 DIAGNOSIS — I69354 Hemiplegia and hemiparesis following cerebral infarction affecting left non-dominant side: Secondary | ICD-10-CM | POA: Diagnosis not present

## 2016-04-28 DIAGNOSIS — I358 Other nonrheumatic aortic valve disorders: Secondary | ICD-10-CM | POA: Diagnosis present

## 2016-04-28 DIAGNOSIS — R51 Headache: Secondary | ICD-10-CM | POA: Diagnosis present

## 2016-04-28 DIAGNOSIS — G8929 Other chronic pain: Secondary | ICD-10-CM | POA: Diagnosis present

## 2016-04-28 DIAGNOSIS — I872 Venous insufficiency (chronic) (peripheral): Secondary | ICD-10-CM | POA: Diagnosis present

## 2016-04-28 DIAGNOSIS — I6789 Other cerebrovascular disease: Secondary | ICD-10-CM | POA: Diagnosis not present

## 2016-04-28 DIAGNOSIS — D62 Acute posthemorrhagic anemia: Secondary | ICD-10-CM | POA: Diagnosis not present

## 2016-04-28 DIAGNOSIS — I69390 Apraxia following cerebral infarction: Secondary | ICD-10-CM | POA: Diagnosis not present

## 2016-04-28 DIAGNOSIS — R112 Nausea with vomiting, unspecified: Secondary | ICD-10-CM | POA: Diagnosis not present

## 2016-04-28 DIAGNOSIS — R1310 Dysphagia, unspecified: Secondary | ICD-10-CM | POA: Diagnosis present

## 2016-04-28 DIAGNOSIS — I679 Cerebrovascular disease, unspecified: Secondary | ICD-10-CM | POA: Diagnosis not present

## 2016-04-28 DIAGNOSIS — I459 Conduction disorder, unspecified: Secondary | ICD-10-CM | POA: Diagnosis present

## 2016-04-28 DIAGNOSIS — E785 Hyperlipidemia, unspecified: Secondary | ICD-10-CM | POA: Diagnosis present

## 2016-04-28 LAB — LIPID PANEL
Cholesterol: 139 mg/dL (ref 0–200)
HDL: 70 mg/dL (ref >40)
LDL CALC: 57 mg/dL (ref 0–99)
TRIGLYCERIDES: 61 mg/dL (ref <150)
Total CHOL/HDL Ratio: 2 RATIO
VLDL: 12 mg/dL (ref 0–40)

## 2016-04-28 MED ORDER — ASPIRIN EC 325 MG PO TBEC
325.0000 mg | DELAYED_RELEASE_TABLET | Freq: Every day | ORAL | Status: DC
  Administered 2016-04-29 – 2016-04-30 (×2): 325 mg via ORAL
  Filled 2016-04-28 (×2): qty 1

## 2016-04-28 MED ORDER — MORPHINE SULFATE (PF) 2 MG/ML IV SOLN: 1.0000 mg | Freq: Once | INTRAVENOUS | Status: DC

## 2016-04-28 NOTE — Evaluation (Signed)
Clinical/Bedside Swallow Evaluation Patient Details  Name: Sarah [name] MRN: [id number] Date of Birth: [birth year]  Today's Date: 04/28/2016 Time: SLP Start Time (ACUTE ONLY): 1142 SLP Stop Time (ACUTE ONLY): 1200 SLP Time Calculation (min) (ACUTE ONLY): 18 min  Past Medical History:  Past Medical History:  Diagnosis Date  . Bladder incontinence   . Chest pain    S/P nuclear 07/17/2008  normal; EF 88% with no ischemia  . Chronic venous insufficiency    BLE edema, chronic, neg venous US 2/13  . Constipation    hx of  . Depression   . Diverticulitis   . Esophageal stricture    s/p dilation 04/2012  . GERD (gastroesophageal reflux disease)   . History of chicken pox   . Hyperlipidemia   . Hypertension   . Osteoarthritis   . Osteoporosis, postmenopausal 06/02/2011  . Polyp of colon   . Stroke [facility]) 07/03/2014   Past Surgical History:  Past Surgical History:  Procedure Laterality Date  . APPENDECTOMY     age 65  . COLONOSCOPY    . POLYPECTOMY    . TUBAL LIGATION    . UPPER GASTROINTESTINAL ENDOSCOPY     HPI:  Pt is an 83 y.o. female who presented to the ED with acute slurred speech and drooling while at her PCP visit. Pt has a PMH significant for Parkinson's, stroke with residual weakness, depression, and anxiety. CT negative for acute abnormality. MRI revealing subcentimeter acute/early subacute infarct within L frontal centrum semiovale.   Assessment / Plan / Recommendation Clinical Impression  Pt has no overt s/s of aspiration, although she does have prolonged mastication due to slow oral movements and reduced ROM that appears more generalized despite mild right facial droop. Pt also has a h/o esophageal issues, although denies any acute exacerbation of symptoms. Recommend Dys 3 diet and thin liquids with meds whole in puree (large pills halved per pt request). Will f/u for tolerance and possible advancement.    Aspiration Risk  Mild aspiration risk    Diet Recommendation  Dysphagia 3 (Mech soft);Thin liquid   Liquid Administration via: Cup;Straw Medication Administration: Whole meds with puree Supervision: Patient able to self feed;Intermittent supervision to cue for compensatory strategies Compensations: Slow rate;Small sips/bites;Follow solids with liquid Postural Changes: Seated upright at 90 degrees;Remain upright for at least 30 minutes after po intake    Other  Recommendations Oral Care Recommendations: Oral care BID   Follow up Recommendations Inpatient Rehab      Frequency and Duration min 2x/week  2 weeks       Prognosis Prognosis for Safe Diet Advancement: Good      Swallow Study   General HPI: Pt is an 83 y.o. female who presented to the ED with acute slurred speech and drooling while at her PCP visit. Pt has a PMH significant for Parkinson's, stroke with residual weakness, depression, and anxiety. CT negative for acute abnormality. MRI revealing subcentimeter acute/early subacute infarct within L frontal centrum semiovale. Type of Study: Bedside Swallow Evaluation Previous Swallow Assessment: BSE April 2016 recommending regular diet and thin liquids, concern for esophageal issues Diet Prior to this Study: NPO Temperature Spikes Noted: No Respiratory Status: Room air History of Recent Intubation: No Behavior/Cognition: Alert;Cooperative;Pleasant mood;Distractible;Requires cueing Oral Care Completed by SLP: No Vision: Functional for self-feeding Self-Feeding Abilities: Able to feed self Patient Positioning: Upright in bed Baseline Vocal Quality: Low vocal intensity    Oral/Motor/Sensory Function Overall Oral Motor/Sensory Function: Other (comment) (baseline left-sided weakness, now with  right facial droop)   Ice Chips Ice chips: Not tested   Thin Liquid Thin Liquid: Within functional limits Presentation: Cup;Self Fed;Straw    Nectar Thick Nectar Thick Liquid: Not tested   Honey Thick Honey Thick Liquid: Not tested   Puree Puree:  Within functional limits Presentation: Self Fed;Spoon   Solid   GO   Solid: Impaired Presentation: Self Fed Oral Phase Functional Implications: Impaired mastication    Functional Assessment Tool Used: skilled clinical judgment Functional Limitations: Swallowing Swallow Current Status [id number]): At least 1 percent but less than 20 percent impaired, limited or restricted Swallow Goal Status [id number]): At least 1 percent but less than 20 percent impaired, limited or restricted   Germain Osgood 04/28/2016,1:20 PM  Germain Osgood, M.A. CCC-SLP [phone]

## 2016-04-28 NOTE — Consult Note (Signed)
Neurology Consultation Reason for Consult: Slurred speech Referring Physician: Tedd Sias  CC: Slurred speech  History is obtained from:patient  HPI: Sarah [name] is a 83 y.o. female with a history of previous stroke causing left sided weakness who presents with new slurred speech that started around 12:30 pm today. She states that her carregiver noticed it, but then went to her PCP who referred her here. She denies any new changes in weakness, and was able to walk normally with her walker.    LKW: 12:30 pm tpa given?: no, mild symptoms   ROS: A 14 point ROS was performed and is negative except as noted in the HPI.  Past Medical History:  Diagnosis Date  . Bladder incontinence   . Chest pain    S/P nuclear 07/17/2008  normal; EF 88% with no ischemia  . Chronic venous insufficiency    BLE edema, chronic, neg venous US 2/13  . Constipation    hx of  . Depression   . Diverticulitis   . Esophageal stricture    s/p dilation 04/2012  . GERD (gastroesophageal reflux disease)   . History of chicken pox   . Hyperlipidemia   . Hypertension   . Osteoarthritis   . Osteoporosis, postmenopausal 06/02/2011  . Polyp of colon   . Stroke [facility]) 07/03/2014     Family History  Problem Relation Age of Onset  . Cancer Son     testicular cancer  . Breast cancer Sister   . Skin cancer Sister   . Colon cancer Neg Hx   . Esophageal cancer Neg Hx   . Rectal cancer Neg Hx   . Stomach cancer Neg Hx      Social History:  reports that she has never smoked. She has never used smokeless tobacco. She reports that she does not drink alcohol or use drugs.   Exam: Current vital signs: BP (!) 140/49 (BP Location: Right Arm)   Pulse 84   Temp 97.6 F (36.4 C) (Oral)   Resp 16   Ht 5' (1.524 m)   Wt 75.5 kg (166 lb 6.4 oz)   SpO2 95%   BMI 32.50 kg/m  Vital signs in last 24 hours: Temp:  [97.6 F (36.4 C)-98.1 F (36.7 C)] 97.6 F (36.4 C) (01/31 0545) Pulse Rate:  [84-99] 84 (01/31  0545) Resp:  [12-21] 16 (01/31 0545) BP: (109-190)/(39-90) 140/49 (01/31 0545) SpO2:  [95 %-100 %] 95 % (01/31 0545) Weight:  [75.5 kg (166 lb 6.4 oz)-75.8 kg (167 lb)] 75.5 kg (166 lb 6.4 oz) (01/30 2048)   Physical Exam  Constitutional: Appears well-developed and well-nourished.  Psych: Affect appropriate to situation Eyes: No scleral injection HENT: No OP obstrucion Head: Normocephalic.  Cardiovascular: Normal rate and regular rhythm.  Respiratory: Effort normal and breath sounds normal to anterior ascultation GI: Soft.  No distension. There is no tenderness.  Skin: WDI  Neuro: Mental Status: Patient is awake, alert, oriented to person, place, month, year, and situation. Patient is able to give a clear and coherent history. No signs of aphasia or neglect Cranial Nerves: II: Visual Fields are full. Pupils are equal, round, and reactive to light.   III,IV, VI: EOMI without ptosis or diploplia.  V: Facial sensation is symmetric to temperature VII: Facial movement is notable for mild right facial weakness, with mild dysarthria.  VIII: hearing is intact to voice X: Uvula elevates symmetrically XI: Shoulder shrug is symmetric. XII: tongue is midline without atrophy or fasciculations.  Motor: Tone  is normal. Bulk is normal. She has a mild LEFT hemiparesis which is baseline, without drift.  Sensory: Sensation is symmetric to light touch and temperature in the arms and legs. Cerebellar: FNF and HKS are intact bilaterally   I have reviewed labs in epic and the results pertinent to this consultation are: Mildly low sodium  I have reviewed the images obtained: CT head - negative  Impression: 83 yo F with new right facial weakness and dysarthria most consistent with new mild stroke. I discussed tPA with the patient, but given lack of any disabling deficit, did not pursue IV tpa.   Recommendations: 1. HgbA1c, fasting lipid panel 2. MRI, MRA  of the brain without contrast 3.  Frequent neuro checks 4. Echocardiogram 5. Carotid dopplers 6. Prophylactic therapy-Antiplatelet med: Aspirin - dose 325mg  PO or 300mg  PR 7. Risk factor modification 8. Telemetry monitoring 9. PT consult, OT consult, Speech consult 10. please page stroke NP  Or  PA  Or MD  from 8am -4 pm starting 1/31 as this patient will be followed by the stroke team at this point.   You can look them up on [email or web address]      Roland Rack, MD [facility] [phone]  If 7pm- 7am, please page neurology on call as listed in [place].

## 2016-04-28 NOTE — Progress Notes (Signed)
*  PRELIMINARY RESULTS* Vascular Ultrasound Carotid Duplex (Doppler) has been completed.  Preliminary findings: Bilateral: No significant (1-39%) ICA stenosis. Antegrade vertebral flow.   Landry Mellow, RDMS, RVT  04/28/2016, 10:02 AM

## 2016-04-28 NOTE — Care Management Note (Signed)
Case Management Note  Patient Details  Name: Sarah [name] MRN: [id number] Date of Birth: 08-21-1926  Subjective/Objective:                 Patient presented from her PCPs office with Acute slurred speech and drooling. Lives at home alone. CM will follow for discharge needs pending PT/OT evals and physician orders.    Action/Plan:   Expected Discharge Date:                  Expected Discharge Plan:     In-House Referral:     Discharge planning Services     Post Acute Care Choice:    Choice offered to:     DME Arranged:    DME Agency:     HH Arranged:    HH Agency:     Status of Service:     If discussed at [employer] of Stay Meetings, dates discussed:    Additional Comments:  Rolm Baptise, RN 04/28/2016, 10:52 AM

## 2016-04-28 NOTE — Evaluation (Signed)
Occupational Therapy Evaluation Patient Details Name: IZARIAH RITZENTHALER MRN: [id number] DOB: 1926/08/03 Today's Date: 04/28/2016    History of Present Illness Pt is an 83 y.o. female who presented to the ED with acute slurred speech and drooling while at her PCP visit. Pt has a PMGH significant for Parkinson's, stroke with residual weakness, depression, and anxiety. CT negative for acute abnormality. MRI revealing subcentimeter acute/early subacute infarct within L frontal centrum semiovale.   Clinical Impression   PTA, pt has assistance from home health aide from 10-6 daily who assists with ADL and IADL. Pt was ambulating with modified independence with Rollator. Pt currently limited by generalized weakness with mobility that she believes is due to not being able to eat at this time. Pt requires max assist with LB ADL and sit<>stand transfers in preparation for ADL. She does present with continuing L UE weakness and decreased coordination from previous CVA. Pt demonstrates functional decline from PLOF and would benefit from continued OT services while admitted to improve independence with ADL and functional mobility. Feel pt will likely progress to be able to D/C home with 24 hour assistance and home health OT services with continued assistance from aide. However, if unable to progress with mobility, may need SNF placement for rehabilitation. OT will continue to follow acutely and update D/C recommendations as needed.     Follow Up Recommendations  Supervision/Assistance - 24 hour;Home health OT ([place])    Equipment Recommendations  None recommended by OT    Recommendations for Other Services       Precautions / Restrictions Precautions Precautions: Fall Restrictions Weight Bearing Restrictions: No      Mobility Bed Mobility Overal bed mobility: Needs Assistance Bed Mobility: Supine to Sit     Supine to sit: Max assist     General bed mobility comments: Max assist this am due to  pt reports of fatigue and that she "can't do this myself." Does complete bed mobilty independently at home.  Transfers Overall transfer level: Needs assistance Equipment used: 4-wheeled walker Transfers: Sit to/from Stand Sit to Stand: Max assist         General transfer comment: Max assist with increased time and VC's to tuck hips under her body. Once standing, pt feeling weak and unable to take steps.    Balance Overall balance assessment: Needs assistance Sitting-balance support: No upper extremity supported;Feet supported Sitting balance-Leahy Scale: Fair     Standing balance support: Bilateral upper extremity supported;During functional activity Standing balance-Leahy Scale: Poor                              ADL Overall ADL's : Needs assistance/impaired Eating/Feeding: NPO   Grooming: Minimal assistance;Sitting   Upper Body Bathing: Minimal assistance;Sitting   Lower Body Bathing: Maximal assistance;Sit to/from stand   Upper Body Dressing : Minimal assistance;Sitting   Lower Body Dressing: Maximal assistance;Sit to/from Health and safety inspector Details (indicate cue type and reason): Pt only able to tolerate sit<>stand with max assist this session due to reports of feeling weak.            General ADL Comments: Pt reporting feeling weak due to not eating. Significantly decreased activity tolerance. Although she requires assistance with ADL at baseline, she demonstrates functional decline with ADL at current level.     Vision Vision Assessment?: Yes Eye Alignment: Within Functional Limits Ocular Range of Motion: Within Functional Limits Alignment/Gaze Preference: Within  Defined Limits Tracking/Visual Pursuits: Decreased smoothness of horizontal tracking Saccades: Within functional limits Visual Fields:  (No deficits noted, will continue to assess.) Additional Comments: Pt reporting blurry vision this morning. Able to read hospital menu. Able to  functionally use vision during ADL. Will continue to assess.   Perception     Praxis      Pertinent Vitals/Pain Pain Assessment: No/denies pain     Hand Dominance Right   Extremity/Trunk Assessment Upper Extremity Assessment Upper Extremity Assessment: Generalized weakness;LUE deficits/detail;RUE deficits/detail RUE Deficits / Details: Lag with coordination testing. LUE Deficits / Details: Weakness at baseline due to previous CVA. Intention tremor with coordination testing at end of session. Slight dysmetria with finger to nose testing.   Lower Extremity Assessment Lower Extremity Assessment: Defer to PT evaluation       Communication Communication Communication: Expressive difficulties (dysarthria)   Cognition Arousal/Alertness: Lethargic Behavior During Therapy: WFL for tasks assessed/performed Overall Cognitive Status: Impaired/Different from baseline Area of Impairment: Problem solving             Problem Solving: Slow processing General Comments: Slow processing with ADL tasks but alert and oriented.    General Comments       Exercises       Shoulder Instructions      Home Living Family/patient expects to be discharged to:: Private residence Living Arrangements: Alone Available Help at Discharge: Home health;Other (Comment) (HH aide comes 10-6 7 days/week) Type of Home: House       Home Layout: Two level;Able to live on main level with bedroom/bathroom     Bathroom Shower/Tub: Occupational psychologist: Standard     Home Equipment: Environmental consultant - 4 wheels;Shower seat          Prior Functioning/Environment Level of Independence: Needs assistance  Gait / Transfers Assistance Needed: Used rollator ADL's / Homemaking Assistance Needed: Needs assistance with dressing/bathing. Does not don/doff socks and shoes independently.            OT Problem List: Decreased strength;Decreased range of motion;Decreased activity tolerance;Impaired  balance (sitting and/or standing);Impaired vision/perception;Decreased safety awareness;Decreased knowledge of use of DME or AE;Decreased knowledge of precautions;Pain;Decreased cognition   OT Treatment/Interventions: Self-care/ADL training;Therapeutic exercise;Energy conservation;DME and/or AE instruction;Therapeutic activities;Patient/family education;Balance training;Cognitive remediation/compensation    OT Goals(Current goals can be found in the care plan section) Acute Rehab OT Goals Patient Stated Goal: to get something to eat OT Goal Formulation: With patient Time For Goal Achievement: 05/05/16 Potential to Achieve Goals: Good ADL Goals Pt Will Perform Grooming: with supervision;standing Pt Will Perform Upper Body Bathing: with supervision;sitting Pt Will Perform Lower Body Bathing: with min assist;sit to/from stand Pt Will Transfer to Toilet: with supervision;ambulating;regular height toilet Pt Will Perform Toileting - Clothing Manipulation and hygiene: sit to/from stand;with supervision Pt Will Perform Tub/Shower Transfer: Shower transfer;ambulating;with min assist (Rollator)  OT Frequency: Min 2X/week   Barriers to D/C:            Co-evaluation              End of Session Equipment Utilized During Treatment: Gait belt (4 wheeled walker)  Activity Tolerance: Patient tolerated treatment well Patient left: in bed;with call bell/phone within reach   Time: 0807-0832 OT Time Calculation (min): 25 min Charges:  OT General Charges $OT Visit: 1 Procedure OT Evaluation $OT Eval Moderate Complexity: 1 Procedure OT Treatments $Self Care/Home Management : 8-22 mins G-Codes: OT G-codes **NOT FOR INPATIENT CLASS** Functional Assessment Tool Used: clinical judgement Functional Limitation:  Self care Self Care Current Status [phone]): At least 40 percent but less than 60 percent impaired, limited or restricted Self Care Goal Status [id number]): At least 1 percent but less than 20  percent impaired, limited or restricted  Norman Herrlich, OTR/L [phone] 04/28/2016, 9:41 AM

## 2016-04-28 NOTE — Progress Notes (Signed)
PT and OT have evaluated pt.  PT has recommended IP Rehab.  OT is recommending HH, however is noting pt. to require min and max assist for upper and lower body bathing and dressing.  Patient was screened by Gerlean Ren for appropriateness for an Inpatient Acute Rehab consult.  At this time, we are recommending Inpatient Rehab consult.  Please order consult if you are agreeable.  [facility] Admissions Coordinator Cell [phone] Office [phone]

## 2016-04-28 NOTE — Progress Notes (Signed)
STROKE TEAM PROGRESS NOTE   HISTORY OF PRESENT ILLNESS (per record) Bernece D Sensabaugh is a 83 y.o. female with a history of previous stroke causing left sided weakness who presents with new slurred speech that started around 12:30 pm today. She states that her carregiver noticed it, but then went to her PCP who referred her here. She denies any new changes in weakness, and was able to walk normally with her walker. She was LKW at 12:30 pm 04/27/2016. Patient was not administered IV t-PA secondary to mild symptoms. She was admitted for further evaluation and treatment.   SUBJECTIVE (INTERVAL HISTORY) Son and daughter are at the bedside. Pt stated that she started to have slurry speech and left leg weakness. However, she did have left knee pain prior to admission and she went to his PCP office for left knee injection when she was referred to our ED for evaluation. On exam, left leg not weak comparing with right leg. Not sure if her feelings of left leg weakness is due to knee pain or due to PD. Her left CR small infarct can not explain her left leg weakness feeling.    OBJECTIVE Temp:  [97.6 F (36.4 C)-98.1 F (36.7 C)] 97.6 F (36.4 C) (01/31 0545) Pulse Rate:  [84-99] 84 (01/31 0545) Cardiac Rhythm: Heart block;Normal sinus rhythm (01/31 0700) Resp:  [12-21] 16 (01/31 0545) BP: (109-190)/(39-90) 140/49 (01/31 0545) SpO2:  [95 %-100 %] 95 % (01/31 0545) Weight:  [75.5 kg (166 lb 6.4 oz)-75.8 kg (167 lb)] 75.5 kg (166 lb 6.4 oz) (01/30 2048)  CBC:  Recent Labs Lab 04/27/16 1631 04/27/16 1638  WBC 5.9  --   NEUTROABS 3.4  --   HGB 12.4 13.6  HCT 38.8 40.0  MCV 93.3  --   PLT 297  --     Basic Metabolic Panel:  Recent Labs Lab 04/27/16 1631 04/27/16 1638  NA 132* 132*  K 4.1 4.1  CL 97* 94*  CO2 27  --   GLUCOSE 115* 112*  BUN 15 17  CREATININE 0.87 0.80  CALCIUM 9.2  --     Lipid Panel:    Component Value Date/Time   CHOL 139 04/28/2016 0725   TRIG 61 04/28/2016 0725    HDL 70 04/28/2016 0725   CHOLHDL 2.0 04/28/2016 0725   VLDL 12 04/28/2016 0725   LDLCALC 57 04/28/2016 0725   HgbA1c:  Lab Results  Component Value Date   HGBA1C 5.8 07/09/2014   Urine Drug Screen: No results found for: LABOPIA, COCAINSCRNUR, LABBENZ, AMPHETMU, THCU, LABBARB    IMAGING I have personally reviewed the radiological images below and agree with the radiology interpretations.  Ct Head Code Stroke W/o Cm 04/27/2016 1. No acute finding by CT. Widespread chronic small-vessel ischemic changes. Right sphenoid wing meningioma as seen previously. 2. ASPECTS is 10   Mr Brain Wo Contrast Mr Jodene Nam Head/brain Wo Cm 04/27/2016 1. Subcentimeter acute/early subacute infarct within the left frontal centrum semiovale. No acute hemorrhage. 2. Stable advanced chronic microvascular ischemic changes and moderate volume loss of the brain. 3. No evidence of large vessel occlusion, or aneurysm of the circle of Willis. Intracranial atherosclerosis with short segments of stenosis most pronounced in the left posterior cerebral artery.   Carotid Doppler   There is 1-39% bilateral ICA stenosis. Vertebral artery flow is antegrade.    2-D echocardiogram Pending   PHYSICAL EXAM  Temp:  [97.6 F (36.4 C)-98.3 F (36.8 C)] 98.3 F (36.8 C) (01/31 2101) Pulse  Rate:  [78-90] 78 (01/31 2101) Resp:  [15-20] 20 (01/31 2101) BP: (109-148)/(39-69) 147/69 (01/31 2101) SpO2:  [95 %-99 %] 96 % (01/31 2101)  General - Well nourished, well developed, in no apparent distress.  Ophthalmologic - Fundi not visualized due to eye movement.  Cardiovascular - Regular rate and rhythm.  Mental Status -  Level of arousal and orientation to time, place, and person were intact. Language including expression, naming, repetition, comprehension was assessed and found intact. Fund of Knowledge was assessed and was intact.  Cranial Nerves II - XII - II - Visual field intact OU. III, IV, VI - Extraocular movements  intact. V - Facial sensation intact bilaterally. VII - right facial droop. VIII - Hearing & vestibular intact bilaterally. X - Palate elevates symmetrically, mild dysarthria. XI - Chin turning & shoulder shrug intact bilaterally. XII - Tongue protrusion intact.  Motor Strength - The patient's strength was 5/5 BUEs and 4+/5 BLEs, symmetrical and pronator drift was absent.  Bulk was normal and fasciculations were absent.   Motor Tone - Muscle tone was assessed at the neck and appendages and was normal.  Reflexes - The patient's reflexes were 1+ in all extremities and she had no pathological reflexes.  Sensory - Light touch, temperature/pinprick were assessed and were symmetrical.    Coordination - The patient had normal movements in the hands and feet with no ataxia or dysmetria.  Tremor was absent.  Gait and Station - not tested   ASSESSMENT/PLAN Ms. YARETSY GROULX is a 83 y.o. female with history of previous stroke, HTN, HLD, Parkinson's disease, depression, and anxiety presenting with slurred speech, right facial weakness. She did not receive IV t-PA due to mild symptoms.   Stroke:  small left frontal CR infarct secondary to small vessel disease source  Resultant  Right facial droop  MRI  subcentimeter left frontal CR infarct  MRA left PCA stenosis  Carotid Doppler  no significant stenosis  2D Echo  pending  LDL 57  HgbA1c pending  Lovenox 40 mg sq daily for VTE prophylaxis  DIET DYS 3 Room service appropriate? Yes; Fluid consistency: Thin  clopidogrel 75 mg daily prior to admission, now on clopidogrel 75 mg daily. Recommend to add ASA for DAPT for 3 months and the  plavix alone.  Patient counseled to be compliant with her antithrombotic medications  Ongoing aggressive stroke risk factor management  Therapy recommendations:  PT recommends CIR, OT recommends home health  Disposition:  Pending  ? Left leg weakness ?  Subjective feeling, not present on  exam  Could be related to her left knee pain pending knee injection prior to admission vs. Related to PD with rigidity missing doses of sinemet during admission  Can not be explained by the stroke  Will speak with PT/OT to confirm the gait pattern  Hypertension  Stable  Permissive hypertension (OK if < 220/120) but gradually normalize in 5-7 days  Long-term BP goal normotensive  Hyperlipidemia  Home meds:  Pravachol 10, resumed in hospital  LDL 57, goal < 70  Continue statin at discharge  Other Stroke Risk Factors  Advanced age  Obesity, Body mass index is 32.5 kg/m., recommend weight loss, diet and exercise as appropriate   Hx stroke/TIA  06/2014 - right posterior frontal lobe infarct in setting of severe R M2 stenosis. Infarct secondary to atherosclerosis and small vessel disease. Changed from aspirin to Plavix.  Other Active Problems  Parkinson's disease - follows with Dr. Posey Pronto at [facility] -  on senimet  Hyponatremia  Anxiety, depression  Hospital day # 0  Rosalin Hawking, MD PhD Stroke Neurology 04/28/2016 9:55 PM  To contact Stroke Continuity provider, please refer to [email or web address] After hours, contact General Neurology

## 2016-04-28 NOTE — Consult Note (Signed)
Physical Medicine and Rehabilitation Consult Reason for Consult: Acute early subacute infarct left frontal centrum semi-ovale Referring Physician: [facility]   HPI: Sarah [name] is a 83 y.o. right handed female with history of Parkinson's disease maintained on Sinemet, CVA with residual left-sided weakness maintained on Plavix. Per chart review and patient's son/daughter, and patient, patient lives alone. Used an assistive device prior to admission. She has a home health aide 10-6 7 days a week, recently changed to 10-4. She needed assistance with some dressing and bathing. She does not donn / doff her socks or shoes independently or drive or get her groceries.She was receiving HH therapies prior to admission. One level home no steps to entry. Presented 04/27/2016 with slurred speech. She was hypertensive 190/70. Mild hyponatremia 132. Troponin negative. Cranial CT scan reviewed, unremarkable for acute process. She did not receive TPA. MRI showed acute early subacute infarct within the left frontal centrum semiovale. No acute hemorrhage. MRA with no evidence of large vessel occlusion or aneurysm. Carotid Dopplers with no ICA stenosis. Echocardiogram is pending. Neurology consulted presently remains on Plavix for CVA prophylaxis. Subcutaneous Lovenox for DVT prophylaxis. Currently on a mechanical soft diet. Physical therapy evaluation completed with recommendations of physical medicine rehabilitation consult.   Review of Systems  Constitutional: Negative for chills and fever.  HENT: Negative for hearing loss and tinnitus.   Eyes: Negative for blurred vision and double vision.  Respiratory: Negative for cough and shortness of breath.   Cardiovascular: Negative for chest pain and palpitations.  Gastrointestinal: Positive for constipation. Negative for nausea and vomiting.       GERD  Genitourinary: Negative for hematuria.       Bladder incontinence  Musculoskeletal: Positive for joint pain and  myalgias.  Skin: Negative for rash.  Neurological: Positive for dizziness, tingling, speech change, focal weakness and weakness. Negative for sensory change.  Psychiatric/Behavioral: Positive for depression. The patient has insomnia.        Anxiety  All other systems reviewed and are negative.  Past Medical History:  Diagnosis Date  . Bladder incontinence   . Chest pain    S/P nuclear 07/17/2008  normal; EF 88% with no ischemia  . Chronic venous insufficiency    BLE edema, chronic, neg venous US 2/13  . Constipation    hx of  . Depression   . Diverticulitis   . Esophageal stricture    s/p dilation 04/2012  . GERD (gastroesophageal reflux disease)   . History of chicken pox   . Hyperlipidemia   . Hypertension   . Osteoarthritis   . Osteoporosis, postmenopausal 06/02/2011  . Polyp of colon   . Stroke [facility]) 07/03/2014   Past Surgical History:  Procedure Laterality Date  . APPENDECTOMY     age 36  . COLONOSCOPY    . POLYPECTOMY    . TUBAL LIGATION    . UPPER GASTROINTESTINAL ENDOSCOPY     Family History  Problem Relation Age of Onset  . Cancer Son     testicular cancer  . Breast cancer Sister   . Skin cancer Sister   . Colon cancer Neg Hx   . Esophageal cancer Neg Hx   . Rectal cancer Neg Hx   . Stomach cancer Neg Hx    Social History:  reports that she has never smoked. She has never used smokeless tobacco. She reports that she does not drink alcohol or use drugs. Allergies:  Allergies  Allergen Reactions  . Lipitor [employer]  Calcium] Other (See Comments)    Leg weakness,memory loss  . Lovastatin Other (See Comments)    Leg weakness  . Celebrex [Celecoxib] Itching and Rash  . Latex Rash  . Macrobid [employer] Macro] Other (See Comments)    Unknown per patient  . Other Other (See Comments)    Always pads   Facility-Administered Medications Prior to Admission  Medication Dose Route Frequency Provider Last Rate Last Dose  . cyanocobalamin  ((VITAMIN B-12)) injection 1,000 mcg  1,000 mcg Intramuscular Q30 days Binnie Rail, MD   1,000 mcg at 06/27/15 1545   Medications Prior to Admission  Medication Sig Dispense Refill  . acetaminophen (TYLENOL) 500 MG tablet Take 1 tablet (500 mg total) by mouth every 8 (eight) hours as needed (for pain). 180 tablet 2  . ALPRAZolam (XANAX) 1 MG tablet Take 1 tablet (1 mg total) by mouth 2 (two) times daily. 60 tablet 1  . carbidopa-levodopa (SINEMET IR) 25-100 MG tablet TAKE 1 TABLET 3 TIMES A DAY. 90 tablet 3  . Cholecalciferol (VITAMIN D PO) Take 1 tablet by mouth daily.     . clopidogrel (PLAVIX) 75 MG tablet Take 1 tablet (75 mg total) by mouth daily. 90 tablet 2  . diclofenac sodium (VOLTAREN) 1 % GEL Apply  To feet 3 times daily prn for pain 1 Tube 5  . furosemide (LASIX) 20 MG tablet TAKE 1 TABLET DAILY AS NEEDED. (Patient taking differently: TAKE 1 TABLET DAILY AS NEEDED FOR EDEMA) 30 tablet 0  . Glucosamine HCl 750 MG TABS Take 1 tablet by mouth 2 (two) times daily.     Marland Kitchen HALCION 0.25 MG tablet TAKE 1/2 TABLET AT BEDTIME AS NEEDED FOR SLEEP. (Patient taking differently: TAKE 0.125mg  AT BEDTIME FOR SLEEP.) 45 tablet 0  . Homeopathic Products (ARNICARE ARNICA EX) Apply 1 application topically daily as needed (pain).    Marland Kitchen ibuprofen (ADVIL,MOTRIN) 200 MG tablet Take 200 mg by mouth every 6 (six) hours as needed for headache.    . nitroGLYCERIN (NITROSTAT) 0.4 MG SL tablet Place 1 tablet (0.4 mg total) under the tongue every 5 (five) minutes as needed for chest pain. 50 tablet prn  . nortriptyline (PAMELOR) 25 MG capsule TAKE 1 CAPSULE AT BEDTIME. 30 capsule 3  . ofloxacin (OCUFLOX) 0.3 % ophthalmic solution Place 1 drop into the right eye 4 (four) times daily.    . pantoprazole (PROTONIX) 40 MG tablet TAKE 1 TABLET ONCE DAILY. 30 tablet 11  . perphenazine (TRILAFON) 2 MG tablet Take 1 tablet (2 mg total) by mouth at bedtime. 30 tablet 2  . pravastatin (PRAVACHOL) 20 MG tablet TAKE (1/2)  TABLET DAILY. 45 tablet 3  . prednisoLONE acetate (PRED FORTE) 1 % ophthalmic suspension Place 1 drop into the right eye every 2 (two) hours while awake.    . VESICARE 10 MG tablet TAKE (1) TABLET DAILY AS NEEDED. (Patient taking differently: TAKE (1) TABLET DAILY AS NEEDED FOR BLADDER) 30 tablet 0    Home: Home Living Family/patient expects to be discharged to:: Private residence Living Arrangements: Alone Available Help at Discharge: Home health, Other (Comment) (aide 10-6 7 days a week) Type of Home: House Home Access: Stairs to enter [employer] of Steps: 1 Entrance Stairs-Rails: None Home Layout: Two level, Able to live on main level with bedroom/bathroom Alternate Level Stairs-Number of Steps: stays on main Bathroom Shower/Tub: Multimedia programmer: Standard Home Equipment: Environmental consultant - 4 wheels, Shower seat  Functional History: Prior Function Level  of Independence: Needs assistance Gait / Transfers Assistance Needed: Used rollator ADL's / Homemaking Assistance Needed: Needs assistance with dressing/bathing. Does not don/doff socks and shoes independently. Functional Status:  Mobility: Bed Mobility Overal bed mobility: Needs Assistance Bed Mobility: Supine to Sit, Sit to Supine Supine to sit: Mod assist Sit to supine: Mod assist General bed mobility comments: cues to use railing, assist for lifting trunk, assist to supine for legs into bed Transfers Overall transfer level: Needs assistance Equipment used: 4-wheeled walker Transfers: Sit to/from Stand Sit to Stand: Mod assist General transfer comment: lifting assist with increased time for initiation,  Ambulation/Gait Ambulation/Gait assistance: Min assist, Mod assist Ambulation Distance (Feet): 12 Feet Assistive device: 4-wheeled walker Gait Pattern/deviations: Step-through pattern, Shuffle, Decreased stride length General Gait Details: assist for weight shifts to initiate stepping, reports L LE feels  weak like could give away, flexed posture, assist to move walker and keep her close to it    ADL: ADL Overall ADL's : Needs assistance/impaired Eating/Feeding: NPO Grooming: Minimal assistance, Sitting Upper Body Bathing: Minimal assistance, Sitting Lower Body Bathing: Maximal assistance, Sit to/from stand Upper Body Dressing : Minimal assistance, Sitting Lower Body Dressing: Maximal assistance, Sit to/from stand Toilet Transfer Details (indicate cue type and reason): Pt only able to tolerate sit<>stand with max assist this session due to reports of feeling weak.  General ADL Comments: Pt reporting feeling weak due to not eating. Significantly decreased activity tolerance. Although she requires assistance with ADL at baseline, she demonstrates functional decline with ADL at current level.  Cognition: Cognition Overall Cognitive Status: Impaired/Different from baseline Orientation Level: Oriented X4 Cognition Arousal/Alertness: Awake/alert Behavior During Therapy: WFL for tasks assessed/performed Overall Cognitive Status: Impaired/Different from baseline Area of Impairment: Problem solving Problem Solving: Slow processing General Comments: Slow processing with ADL tasks but alert and oriented.   Blood pressure (!) 140/49, pulse 84, temperature 97.6 F (36.4 C), temperature source Oral, resp. rate 16, height 5' (1.524 m), weight 75.5 kg (166 lb 6.4 oz), SpO2 95 %. Physical Exam  Vitals reviewed. Constitutional: She is oriented to person, place, and time. She appears well-developed and well-nourished.  HENT:  Head: Normocephalic and atraumatic.  Eyes: Conjunctivae and EOM are normal.  Neck: Normal range of motion. Neck supple. No thyromegaly present.  Cardiovascular: Normal rate and regular rhythm.   Respiratory: Effort normal and breath sounds normal. No respiratory distress.  GI: Soft. Bowel sounds are normal. She exhibits no distension.  Musculoskeletal: She exhibits  tenderness. She exhibits no edema.  Neurological: She is alert and oriented to person, place, and time.  Speech is dysarthric but intelligible.  She follows simple commands Mild left facial droop Motor: B/l UE 5/5 proximal to distal RLE: 4/5 proximal to distal (pain inhibition) LLE: 4-/5 proxima to distal with apraxia (pain inhibition) Sensation intact to light touch DTRs symmetric  Skin: Skin is warm and dry.  Psychiatric: She has a normal mood and affect. Her behavior is normal.    Results for orders placed or performed during the hospital encounter of 04/27/16 (from the past 24 hour(s))  Protime-INR     Status: None   Collection Time: 04/27/16  4:31 PM  Result Value Ref Range   Prothrombin Time 13.0 11.4 - 15.2 seconds   INR 0.98   APTT     Status: None   Collection Time: 04/27/16  4:31 PM  Result Value Ref Range   aPTT 33 24 - 36 seconds  CBC     Status: None  Collection Time: 04/27/16  4:31 PM  Result Value Ref Range   WBC 5.9 4.0 - 10.5 K/uL   RBC 4.16 3.87 - 5.11 MIL/uL   Hemoglobin 12.4 12.0 - 15.0 g/dL   HCT 38.8 36.0 - 46.0 %   MCV 93.3 78.0 - 100.0 fL   MCH 29.8 26.0 - 34.0 pg   MCHC 32.0 30.0 - 36.0 g/dL   RDW 13.9 11.5 - 15.5 %   Platelets 297 150 - 400 K/uL  Differential     Status: None   Collection Time: 04/27/16  4:31 PM  Result Value Ref Range   Neutrophils Relative % 59 %   Neutro Abs 3.4 1.7 - 7.7 K/uL   Lymphocytes Relative 28 %   Lymphs Abs 1.7 0.7 - 4.0 K/uL   Monocytes Relative 12 %   Monocytes Absolute 0.7 0.1 - 1.0 K/uL   Eosinophils Relative 1 %   Eosinophils Absolute 0.1 0.0 - 0.7 K/uL   Basophils Relative 0 %   Basophils Absolute 0.0 0.0 - 0.1 K/uL  Comprehensive metabolic panel     Status: Abnormal   Collection Time: 04/27/16  4:31 PM  Result Value Ref Range   Sodium 132 (L) 135 - 145 mmol/L   Potassium 4.1 3.5 - 5.1 mmol/L   Chloride 97 (L) 101 - 111 mmol/L   CO2 27 22 - 32 mmol/L   Glucose, Bld 115 (H) 65 - 99 mg/dL   BUN 15 6  - 20 mg/dL   Creatinine, Ser 0.87 0.44 - 1.00 mg/dL   Calcium 9.2 8.9 - 10.3 mg/dL   Total Protein 6.4 (L) 6.5 - 8.1 g/dL   Albumin 3.5 3.5 - 5.0 g/dL   AST 11 (L) 15 - 41 U/L   ALT 9 (L) 14 - 54 U/L   Alkaline Phosphatase 46 38 - 126 U/L   Total Bilirubin 0.3 0.3 - 1.2 mg/dL   GFR calc non Af Amer 58 (L) >60 mL/min   GFR calc Af Amer >60 >60 mL/min   Anion gap 8 5 - 15  I-stat troponin, ED     Status: None   Collection Time: 04/27/16  4:36 PM  Result Value Ref Range   Troponin i, poc 0.00 0.00 - 0.08 ng/mL   Comment 3          I-Stat Chem 8, ED     Status: Abnormal   Collection Time: 04/27/16  4:38 PM  Result Value Ref Range   Sodium 132 (L) 135 - 145 mmol/L   Potassium 4.1 3.5 - 5.1 mmol/L   Chloride 94 (L) 101 - 111 mmol/L   BUN 17 6 - 20 mg/dL   Creatinine, Ser 0.80 0.44 - 1.00 mg/dL   Glucose, Bld 112 (H) 65 - 99 mg/dL   Calcium, Ion 1.14 (L) 1.15 - 1.40 mmol/L   TCO2 26 0 - 100 mmol/L   Hemoglobin 13.6 12.0 - 15.0 g/dL   HCT 40.0 36.0 - 46.0 %  Urinalysis, Routine w reflex microscopic     Status: Abnormal   Collection Time: 04/27/16  8:20 PM  Result Value Ref Range   Color, Urine YELLOW YELLOW   APPearance HAZY (A) CLEAR   Specific Gravity, Urine 1.008 1.005 - 1.030   pH 7.0 5.0 - 8.0   Glucose, UA NEGATIVE NEGATIVE mg/dL   Hgb urine dipstick NEGATIVE NEGATIVE   Bilirubin Urine NEGATIVE NEGATIVE   Ketones, ur NEGATIVE NEGATIVE mg/dL   Protein, ur NEGATIVE NEGATIVE mg/dL  Nitrite NEGATIVE NEGATIVE   Leukocytes, UA MODERATE (A) NEGATIVE   RBC / HPF NONE SEEN 0 - 5 RBC/hpf   WBC, UA 6-30 0 - 5 WBC/hpf   Bacteria, UA RARE (A) NONE SEEN   Squamous Epithelial / LPF 0-5 (A) NONE SEEN  Lipid panel     Status: None   Collection Time: 04/28/16  7:25 AM  Result Value Ref Range   Cholesterol 139 0 - 200 mg/dL   Triglycerides 61 <150 mg/dL   HDL 70 >40 mg/dL   Total CHOL/HDL Ratio 2.0 RATIO   VLDL 12 0 - 40 mg/dL   LDL Cholesterol 57 0 - 99 mg/dL   Mr Brain Wo  Contrast  Result Date: 04/27/2016 CLINICAL DATA:  83 y/o  F; acute slurred speech and drooling. EXAM: MRI HEAD WITHOUT CONTRAST MRA HEAD WITHOUT CONTRAST TECHNIQUE: Multiplanar, multiecho pulse sequences of the brain and surrounding structures were obtained without intravenous contrast. Angiographic images of the head were obtained using MRA technique without contrast. COMPARISON:  04/27/2016 CT head. MRI head and MRA head dated 07/03/2014. FINDINGS: MRI HEAD FINDINGS Brain: Left frontal centrum semiovale 5 mm focus of diffusion restriction. Stable T2 FLAIR hyperintense signal abnormality in white matter is nonspecific but consistent with advanced chronic microvascular ischemic change. Moderate brain parenchymal volume loss. No abnormal susceptibility hypointensity to indicate acute intracranial hemorrhage. No focal mass effect, hydrocephalus, or extra-axial collection. Vascular: As below. Skull and upper cervical spine: Normal marrow signal. Sinuses/Orbits: Negative. Other: None. MRA HEAD FINDINGS Internal carotid arteries: Patent. Mild bilateral ICA irregularity in the cavernous segments without significant stenosis. Anterior cerebral arteries:  Patent. Middle cerebral arteries: Patent. Anterior communicating artery: Patent. Posterior communicating arteries: Patent. Left P1 and P2 short segments of moderate stenosis. Posterior cerebral arteries:  Patent. Basilar artery:  Patent. Vertebral arteries:  Patent. No large vessel occlusion or aneurysm. IMPRESSION: 1. Subcentimeter acute/early subacute infarct within the left frontal centrum semiovale. No acute hemorrhage. 2. Stable advanced chronic microvascular ischemic changes and moderate volume loss of the brain. 3. No evidence of large vessel occlusion, or aneurysm of the circle of Willis. Intracranial atherosclerosis with short segments of stenosis most pronounced in the left posterior cerebral artery. These results will be called to the ordering clinician or  representative by the Radiologist Assistant, and communication documented in the PACS or zVision Dashboard. Electronically Signed   By: Kristine Garbe M.D.   On: 04/27/2016 23:05   Mr Jodene Nam Head/brain [id number] Cm  Result Date: 04/27/2016 CLINICAL DATA:  83 y/o  F; acute slurred speech and drooling. EXAM: MRI HEAD WITHOUT CONTRAST MRA HEAD WITHOUT CONTRAST TECHNIQUE: Multiplanar, multiecho pulse sequences of the brain and surrounding structures were obtained without intravenous contrast. Angiographic images of the head were obtained using MRA technique without contrast. COMPARISON:  04/27/2016 CT head. MRI head and MRA head dated 07/03/2014. FINDINGS: MRI HEAD FINDINGS Brain: Left frontal centrum semiovale 5 mm focus of diffusion restriction. Stable T2 FLAIR hyperintense signal abnormality in white matter is nonspecific but consistent with advanced chronic microvascular ischemic change. Moderate brain parenchymal volume loss. No abnormal susceptibility hypointensity to indicate acute intracranial hemorrhage. No focal mass effect, hydrocephalus, or extra-axial collection. Vascular: As below. Skull and upper cervical spine: Normal marrow signal. Sinuses/Orbits: Negative. Other: None. MRA HEAD FINDINGS Internal carotid arteries: Patent. Mild bilateral ICA irregularity in the cavernous segments without significant stenosis. Anterior cerebral arteries:  Patent. Middle cerebral arteries: Patent. Anterior communicating artery: Patent. Posterior communicating arteries: Patent. Left P1 and P2 short  segments of moderate stenosis. Posterior cerebral arteries:  Patent. Basilar artery:  Patent. Vertebral arteries:  Patent. No large vessel occlusion or aneurysm. IMPRESSION: 1. Subcentimeter acute/early subacute infarct within the left frontal centrum semiovale. No acute hemorrhage. 2. Stable advanced chronic microvascular ischemic changes and moderate volume loss of the brain. 3. No evidence of large vessel occlusion, or  aneurysm of the circle of Willis. Intracranial atherosclerosis with short segments of stenosis most pronounced in the left posterior cerebral artery. These results will be called to the ordering clinician or representative by the Radiologist Assistant, and communication documented in the PACS or zVision Dashboard. Electronically Signed   By: Kristine Garbe M.D.   On: 04/27/2016 23:05   Ct Head Code Stroke W/o Cm  Result Date: 04/27/2016 CLINICAL DATA:  Code stroke. Slurred speech. Intermittent ruling. Right arm weakness. EXAM: CT HEAD WITHOUT CONTRAST TECHNIQUE: Contiguous axial images were obtained from the base of the skull through the vertex without intravenous contrast. COMPARISON:  08/29/2014 FINDINGS: Brain: Generalized brain atrophy. Confluent chronic small vessel ischemic changes affecting the cerebral hemispheric white matter. No evidence of cortical infarction. No mass lesion, hemorrhage, hydrocephalus or extra-axial collection. Calcified meningioma of the sphenoid wing on the right as seen previously. Vascular: There is atherosclerotic calcification of the major vessels at the base of the brain. No acute hyperdense vessel identified. Skull: Negative Sinuses/Orbits: Clear/normal Other: None significant ASPECTS ([place] Stroke Program Early CT Score) - Ganglionic level infarction (caudate, lentiform nuclei, internal capsule, insula, M1-M3 cortex): 7 - Supraganglionic infarction (M4-M6 cortex): 3 Total score (0-10 with 10 being normal): 10 IMPRESSION: 1. No acute finding by CT. Widespread chronic small-vessel ischemic changes. Right sphenoid wing meningioma as seen previously. 2. ASPECTS is 10 These results were called by telephone at the time of interpretation on 04/27/2016 at 4:36 pm to Dr. Leonel Ramsay, who verbally acknowledged these results. Electronically Signed   By: Nelson Chimes M.D.   On: 04/27/2016 16:37    Assessment/Plan: Diagnosis: Acute early subacute infarct left frontal  centrum semi-ovale Labs and images independently reviewed.  Records reviewed and summated above. Stroke: Continue secondary stroke prophylaxis and Risk Factor Modification listed below:   Antiplatelet therapy:   Blood Pressure Management:  Continue current medication with prn's with permisive HTN per primary team Statin Agent:   Motor recovery: Fluoxetine  1. Does the need for close, 24 hr/day medical supervision in concert with the patient's rehab needs make it unreasonable for this patient to be served in a less intensive setting? Yes  2. Co-Morbidities requiring supervision/potential complications: dysphagia (advance diet as tolerated), Parkinson's disease (cont meds, cont therapies), CVA with residual left-sided weakness, HTN (monitor and provide prns in accordance with increased physical exertion and pain), hyponatremia (cont to monitor, treat if necessary), anxiety (prn meds) 3. Due to safety, disease management, medication administration and patient education, does the patient require 24 hr/day rehab nursing? Yes 4. Does the patient require coordinated care of a physician, rehab nurse, PT (1-2 hrs/day, 5 days/week), OT (1-2 hrs/day, 5 days/week) and SLP (1-2 hrs/day, 5 days/week) to address physical and functional deficits in the context of the above medical diagnosis(es)? Yes Addressing deficits in the following areas: balance, endurance, locomotion, strength, transferring, bathing, dressing, grooming, toileting, speech, swallowing and psychosocial support 5. Can the patient actively participate in an intensive therapy program of at least 3 hrs of therapy per day at least 5 days per week? Potentially 6. The potential for patient to make measurable gains while on inpatient rehab is  excellent 7. Anticipated functional outcomes upon discharge from inpatient rehab are min assist  with PT, min assist with OT, modified independent and supervision with SLP. 8. Estimated rehab length of stay to  reach the above functional goals is: 12-15 days. 9. Does the patient have adequate social supports and living environment to accommodate these discharge functional goals? Potentially 10. Anticipated D/C setting: Home 11. Anticipated post D/C treatments: HH therapy and Home excercise program 12. Overall Rehab/Functional Prognosis: good and fair  RECOMMENDATIONS: This patient's condition is appropriate for continued rehabilitative care in the following setting: Pt will require increase in assistance after discharge.  Further, she may not be that far from her baseline. If this is the case, recommend SNF for less intense and prlonged course of therapies with PM&R outpt follow up. Patient has agreed to participate in recommended program. Potentially Note that insurance prior authorization may be required for reimbursement for recommended care.  Comment: Rehab Admissions Coordinator to follow up.  Delice Lesch, MD, Mellody Drown Cathlyn Parsons., PA-C 04/28/2016

## 2016-04-28 NOTE — Evaluation (Signed)
Physical Therapy Evaluation Patient Details Name: Sarah [name] MRN: [id number] DOB: 03/31/1926 Today's Date: 04/28/2016   History of Present Illness  Pt is an 83 y.o. female who presented to the ED with acute slurred speech and drooling while at her PCP visit. Pt has a PMH significant for Parkinson's, stroke with residual weakness, depression, and anxiety. CT negative for acute abnormality. MRI revealing subcentimeter acute/early subacute infarct within L frontal centrum semiovale.  Clinical Impression  Patient presents with decreased mobility due to deficits listed in PT problem list.  Currently needing mod A for mobiltiy and previously mod independent with rollator in the home with assist for ADL's/IADL's.  Feel she will benefit from CIR level rehab prior to d/c home with assist.     Follow Up Recommendations CIR    Equipment Recommendations  None recommended by PT    Recommendations for Other Services Rehab consult     Precautions / Restrictions Precautions Precautions: Fall Restrictions Weight Bearing Restrictions: No      Mobility  Bed Mobility Overal bed mobility: Needs Assistance Bed Mobility: Supine to Sit;Sit to Supine     Supine to sit: Mod assist Sit to supine: Mod assist   General bed mobility comments: cues to use railing, assist for lifting trunk, assist to supine for legs into bed  Transfers Overall transfer level: Needs assistance Equipment used: 4-wheeled walker Transfers: Sit to/from Stand Sit to Stand: Mod assist         General transfer comment: lifting assist with increased time for initiation,   Ambulation/Gait Ambulation/Gait assistance: Min assist;Mod assist Ambulation Distance (Feet): 12 Feet Assistive device: 4-wheeled walker Gait Pattern/deviations: Step-through pattern;Shuffle;Decreased stride length     General Gait Details: assist for weight shifts to initiate stepping, reports L LE feels weak like could give away, flexed posture,  assist to move walker and keep her close to it  Stairs            Wheelchair Mobility    Modified Rankin (Stroke Patients Only) Modified Rankin (Stroke Patients Only) Pre-Morbid Rankin Score: Moderate disability Modified Rankin: Moderately severe disability     Balance Overall balance assessment: Needs assistance Sitting-balance support: Feet supported Sitting balance-Leahy Scale: Fair     Standing balance support: Bilateral upper extremity supported;During functional activity Standing balance-Leahy Scale: Poor Standing balance comment: UE support for balance, assist even with walker                             Pertinent Vitals/Pain Pain Assessment: No/denies pain    Home Living Family/patient expects to be discharged to:: Private residence Living Arrangements: Alone Available Help at Discharge: Home health;Other (Comment) (aide 10-6 7 days a week) Type of Home: House Home Access: Stairs to enter Entrance Stairs-Rails: None Entrance Stairs-Number of Steps: 1 Home Layout: Two level;Able to live on main level with bedroom/bathroom Home Equipment: Walker - 4 wheels;Shower seat      Prior Function Level of Independence: Needs assistance   Gait / Transfers Assistance Needed: Used rollator  ADL's / Homemaking Assistance Needed: Needs assistance with dressing/bathing. Does not don/doff socks and shoes independently.        Hand Dominance   Dominant Hand: Right    Extremity/Trunk Assessment   Upper Extremity Assessment Upper Extremity Assessment: Defer to OT evaluation RUE Deficits / Details: Lag with coordination testing. LUE Deficits / Details: Weakness at baseline due to previous CVA. Intention tremor with coordination testing at end of  session. Slight dysmetria with finger to nose testing.    Lower Extremity Assessment Lower Extremity Assessment: Overall WFL for tasks assessed    Cervical / Trunk Assessment Cervical / Trunk Assessment:  Kyphotic  Communication   Communication: Expressive difficulties (dyarthria)  Cognition Arousal/Alertness: Awake/alert Behavior During Therapy: WFL for tasks assessed/performed Overall Cognitive Status: Impaired/Different from baseline Area of Impairment: Problem solving             Problem Solving: Slow processing General Comments: Slow processing with ADL tasks but alert and oriented.     General Comments General comments (skin integrity, edema, etc.): son in room, reports have increased to use second caregiver at times for overnight    Exercises     Assessment/Plan    PT Assessment Patient needs continued PT services  PT Problem List Decreased balance;Decreased safety awareness;Decreased knowledge of use of DME;Decreased mobility;Decreased strength;Decreased coordination          PT Treatment Interventions DME instruction;Gait training;Therapeutic exercise;Patient/family education;Therapeutic activities;Balance training;Stair training;Functional mobility training    PT Goals (Current goals can be found in the Care Plan section)  Acute Rehab PT Goals Patient Stated Goal: to return home PT Goal Formulation: With patient/family Time For Goal Achievement: 05/12/16 Potential to Achieve Goals: Good    Frequency Min 4X/week   Barriers to discharge Decreased caregiver support      Co-evaluation               End of Session Equipment Utilized During Treatment: Gait belt Activity Tolerance: Patient limited by fatigue Patient left: in bed;with call bell/phone within reach;with family/visitor present;Other (comment) (SLP present)      Functional Assessment Tool Used: Clinical Judgement Functional Limitation: Mobility: Walking and moving around Mobility: Walking and Moving Around Current Status [phone]): At least 40 percent but less than 60 percent impaired, limited or restricted Mobility: Walking and Moving Around Goal Status [phone]): At least 20 percent but less  than 40 percent impaired, limited or restricted    Time: [id number] PT Time Calculation (min) (ACUTE ONLY): 33 min   Charges:   PT Evaluation $PT Eval Moderate Complexity: 1 Procedure PT Treatments $Gait Training: 8-22 mins   PT G Codes:   PT G-Codes **NOT FOR INPATIENT CLASS** Functional Assessment Tool Used: Clinical Judgement Functional Limitation: Mobility: Walking and moving around Mobility: Walking and Moving Around Current Status [id number]): At least 40 percent but less than 60 percent impaired, limited or restricted Mobility: Walking and Moving Around Goal Status [phone]): At least 20 percent but less than 40 percent impaired, limited or restricted    Reginia Naas 04/28/2016, 12:58 PM  Magda Kiel, [place] 04/28/2016

## 2016-04-28 NOTE — Progress Notes (Signed)
PROGRESS NOTE    PAZ PEDROZA  [id number] DOB: 01-18-1927 DOA: 04/27/2016 PCP: Hoyt Koch, MD   Outpatient Specialists:     Brief Narrative:  Sarah [name] is a 83 y.o. female with medical history significant for Parkinson's, stroke with residual weakness, depression, and anxiety, now presenting to the emergency department at the direction of her PCP for evaluation of acute slurred speech and drooling. Patient has history of CVA with residual left-sided weakness and chronic left facial droop, but was noted this morning after breakfast to develop slurring of her speech and drooling. She was taken to see her primary care physician today for a general wellness exam, but her PCP was more concerned with the new slurring of speech and new drooling. Neurological exam was otherwise stable for the PCP. Patient denies any recent illness and reports that she felt well upon waking. She is aware of her speech difficulty earlier today, and feels it is resolving. She denies any recent fevers or chills, cough or dyspnea, dysuria, flank pain, abdominal pain, nausea, vomiting, or diarrhea. There is been no chest pain or palpitations. She denies headache or neck pain.   Assessment & Plan:   Principal Problem:   CVA (cerebral vascular accident) [facility]) Active Problems:   Anxiety   Hyponatremia   Primary Parkinsonism ([place])   Hypertension   Dysarthria   History of stroke   Acute focal neurological deficit   Acute dysarthria, drooling  - Pt with residual left-sided motor weakness from ischemic left MCA infarct in April 2016  - Noted this am after breakfast to have new dysarthria and drooling; she saw PCP for a regularly-scheduled appt in the afternoon and was directed to ED  - Neurology is consulting and much appreciated; tPA not given as sxs were resolving  - Monitor on telemetry with frequent neuro checks, PT/OT/SLP evals  - NPO pending swallow eval  - MRI: Subcentimeter acute/early  subacute infarct within the left frontal centrum semiovale. No acute hemorrhage -echo: pending -carotids: no significant stenosis -HGbA1C: pending -FLP: LDL 57-- continue statin - Prophylactic aspirin precluded by stated allergy  - Continue Plavix and statin    Hyponatremia  - Serum sodium 132 on admission  - Possibly secondary to nortriptyline and/or perphenazine - This appears to be chronic and stable    Hypertension  - BP elevated to 190/70 on admission in setting of suspected ischemic CVA  - Plan to permit HTN to [phone] for now in the acute CVA; will defer to neuro if lower target advised    Hx of CVA  - Left MCA ischemic infarct in April 2016 with residual left-sided motor weakness  - Continue Plavix and statin    Anxiety, depression  - Appears to be stable  - Continue nortriptyline, perphenazine, and prn triazolam    Parkinsonism  - Pt tremulous on admission  - Neuro is consulting and much appreciated  - Continue Sinemet      DVT prophylaxis:  Lovenox   Code Status: Full Code   Family Communication: patient  Disposition Plan:     Consultants:   neuro     Subjective: Speech difficult to understand at times -getting bath  Objective: Vitals:   04/27/16 2345 04/28/16 0145 04/28/16 0345 04/28/16 0545  BP: 132/69 (!) 109/39 (!) 132/52 (!) 140/49  Pulse: 90 86 88 84  Resp: 16 16 16 16   Temp: 97.9 F (36.6 C) 98.1 F (36.7 C) 98 F (36.7 C) 97.6 F (36.4 C)  TempSrc:  Oral Oral Oral Oral  SpO2: 99% 96% 95% 95%  Weight:      Height:        Intake/Output Summary (Last 24 hours) at 04/28/16 1047 Last data filed at 04/28/16 0834  Gross per 24 hour  Intake           245.92 ml  Output                0 ml  Net           245.92 ml   Filed Weights   04/27/16 2048  Weight: 75.5 kg (166 lb 6.4 oz)    Examination:  General exam: Appears calm and comfortable  Respiratory system: Clear to auscultation. Respiratory effort  normal. Cardiovascular system: S1 & S2 heard, RRR. No JVD, murmurs, rubs, gallops or clicks. No pedal edema. Gastrointestinal system: Abdomen is nondistended, soft and nontender. No organomegaly or masses felt. Normal bowel sounds heard. Central nervous system: Alert and oriented. No focal neurological deficits.     Data Reviewed: I have personally reviewed following labs and imaging studies  CBC:  Recent Labs Lab 04/27/16 1631 04/27/16 1638  WBC 5.9  --   NEUTROABS 3.4  --   HGB 12.4 13.6  HCT 38.8 40.0  MCV 93.3  --   PLT 297  --    Basic Metabolic Panel:  Recent Labs Lab 04/27/16 1631 04/27/16 1638  NA 132* 132*  K 4.1 4.1  CL 97* 94*  CO2 27  --   GLUCOSE 115* 112*  BUN 15 17  CREATININE 0.87 0.80  CALCIUM 9.2  --    GFR: Estimated Creatinine Clearance: 45 mL/min (by C-G formula based on SCr of 0.8 mg/dL). Liver Function Tests:  Recent Labs Lab 04/27/16 1631  AST 11*  ALT 9*  ALKPHOS 46  BILITOT 0.3  PROT 6.4*  ALBUMIN 3.5   No results for input(s): LIPASE, AMYLASE in the last 168 hours. No results for input(s): AMMONIA in the last 168 hours. Coagulation Profile:  Recent Labs Lab 04/27/16 1631  INR 0.98   Cardiac Enzymes: No results for input(s): CKTOTAL, CKMB, CKMBINDEX, TROPONINI in the last 168 hours. BNP (last 3 results) No results for input(s): PROBNP in the last 8760 hours. HbA1C: No results for input(s): HGBA1C in the last 72 hours. CBG: No results for input(s): GLUCAP in the last 168 hours. Lipid Profile:  Recent Labs  04/28/16 0725  CHOL 139  HDL 70  LDLCALC 57  TRIG 61  CHOLHDL 2.0   Thyroid Function Tests: No results for input(s): TSH, T4TOTAL, FREET4, T3FREE, THYROIDAB in the last 72 hours. Anemia Panel: No results for input(s): VITAMINB12, FOLATE, FERRITIN, TIBC, IRON, RETICCTPCT in the last 72 hours. Urine analysis:    Component Value Date/Time   COLORURINE YELLOW 04/27/2016 2020   APPEARANCEUR HAZY (A)  04/27/2016 2020   LABSPEC 1.008 04/27/2016 2020   PHURINE 7.0 04/27/2016 2020   GLUCOSEU NEGATIVE 04/27/2016 2020   HGBUR NEGATIVE 04/27/2016 2020   BILIRUBINUR NEGATIVE 04/27/2016 2020   KETONESUR NEGATIVE 04/27/2016 2020   PROTEINUR NEGATIVE 04/27/2016 2020   UROBILINOGEN 0.2 07/04/2014 0945   NITRITE NEGATIVE 04/27/2016 2020   LEUKOCYTESUR MODERATE (A) 04/27/2016 2020     )No results found for this or any previous visit (from the past 240 hour(s)).    Anti-infectives    None       Radiology Studies: Mr Brain Wo Contrast  Result Date: 04/27/2016 CLINICAL DATA:  83 y/o  F;  acute slurred speech and drooling. EXAM: MRI HEAD WITHOUT CONTRAST MRA HEAD WITHOUT CONTRAST TECHNIQUE: Multiplanar, multiecho pulse sequences of the brain and surrounding structures were obtained without intravenous contrast. Angiographic images of the head were obtained using MRA technique without contrast. COMPARISON:  04/27/2016 CT head. MRI head and MRA head dated 07/03/2014. FINDINGS: MRI HEAD FINDINGS Brain: Left frontal centrum semiovale 5 mm focus of diffusion restriction. Stable T2 FLAIR hyperintense signal abnormality in white matter is nonspecific but consistent with advanced chronic microvascular ischemic change. Moderate brain parenchymal volume loss. No abnormal susceptibility hypointensity to indicate acute intracranial hemorrhage. No focal mass effect, hydrocephalus, or extra-axial collection. Vascular: As below. Skull and upper cervical spine: Normal marrow signal. Sinuses/Orbits: Negative. Other: None. MRA HEAD FINDINGS Internal carotid arteries: Patent. Mild bilateral ICA irregularity in the cavernous segments without significant stenosis. Anterior cerebral arteries:  Patent. Middle cerebral arteries: Patent. Anterior communicating artery: Patent. Posterior communicating arteries: Patent. Left P1 and P2 short segments of moderate stenosis. Posterior cerebral arteries:  Patent. Basilar artery:  Patent.  Vertebral arteries:  Patent. No large vessel occlusion or aneurysm. IMPRESSION: 1. Subcentimeter acute/early subacute infarct within the left frontal centrum semiovale. No acute hemorrhage. 2. Stable advanced chronic microvascular ischemic changes and moderate volume loss of the brain. 3. No evidence of large vessel occlusion, or aneurysm of the circle of Willis. Intracranial atherosclerosis with short segments of stenosis most pronounced in the left posterior cerebral artery. These results will be called to the ordering clinician or representative by the Radiologist Assistant, and communication documented in the PACS or zVision Dashboard. Electronically Signed   By: Kristine Garbe M.D.   On: 04/27/2016 23:05   Mr Jodene Nam Head/brain [id number] Cm  Result Date: 04/27/2016 CLINICAL DATA:  83 y/o  F; acute slurred speech and drooling. EXAM: MRI HEAD WITHOUT CONTRAST MRA HEAD WITHOUT CONTRAST TECHNIQUE: Multiplanar, multiecho pulse sequences of the brain and surrounding structures were obtained without intravenous contrast. Angiographic images of the head were obtained using MRA technique without contrast. COMPARISON:  04/27/2016 CT head. MRI head and MRA head dated 07/03/2014. FINDINGS: MRI HEAD FINDINGS Brain: Left frontal centrum semiovale 5 mm focus of diffusion restriction. Stable T2 FLAIR hyperintense signal abnormality in white matter is nonspecific but consistent with advanced chronic microvascular ischemic change. Moderate brain parenchymal volume loss. No abnormal susceptibility hypointensity to indicate acute intracranial hemorrhage. No focal mass effect, hydrocephalus, or extra-axial collection. Vascular: As below. Skull and upper cervical spine: Normal marrow signal. Sinuses/Orbits: Negative. Other: None. MRA HEAD FINDINGS Internal carotid arteries: Patent. Mild bilateral ICA irregularity in the cavernous segments without significant stenosis. Anterior cerebral arteries:  Patent. Middle cerebral arteries:  Patent. Anterior communicating artery: Patent. Posterior communicating arteries: Patent. Left P1 and P2 short segments of moderate stenosis. Posterior cerebral arteries:  Patent. Basilar artery:  Patent. Vertebral arteries:  Patent. No large vessel occlusion or aneurysm. IMPRESSION: 1. Subcentimeter acute/early subacute infarct within the left frontal centrum semiovale. No acute hemorrhage. 2. Stable advanced chronic microvascular ischemic changes and moderate volume loss of the brain. 3. No evidence of large vessel occlusion, or aneurysm of the circle of Willis. Intracranial atherosclerosis with short segments of stenosis most pronounced in the left posterior cerebral artery. These results will be called to the ordering clinician or representative by the Radiologist Assistant, and communication documented in the PACS or zVision Dashboard. Electronically Signed   By: Kristine Garbe M.D.   On: 04/27/2016 23:05   Ct Head Code Stroke W/o Cm  Result Date: 04/27/2016  CLINICAL DATA:  Code stroke. Slurred speech. Intermittent ruling. Right arm weakness. EXAM: CT HEAD WITHOUT CONTRAST TECHNIQUE: Contiguous axial images were obtained from the base of the skull through the vertex without intravenous contrast. COMPARISON:  08/29/2014 FINDINGS: Brain: Generalized brain atrophy. Confluent chronic small vessel ischemic changes affecting the cerebral hemispheric white matter. No evidence of cortical infarction. No mass lesion, hemorrhage, hydrocephalus or extra-axial collection. Calcified meningioma of the sphenoid wing on the right as seen previously. Vascular: There is atherosclerotic calcification of the major vessels at the base of the brain. No acute hyperdense vessel identified. Skull: Negative Sinuses/Orbits: Clear/normal Other: None significant ASPECTS ([place] Stroke Program Early CT Score) - Ganglionic level infarction (caudate, lentiform nuclei, internal capsule, insula, M1-M3 cortex): 7 - Supraganglionic  infarction (M4-M6 cortex): 3 Total score (0-10 with 10 being normal): 10 IMPRESSION: 1. No acute finding by CT. Widespread chronic small-vessel ischemic changes. Right sphenoid wing meningioma as seen previously. 2. ASPECTS is 10 These results were called by telephone at the time of interpretation on 04/27/2016 at 4:36 pm to Dr. Leonel Ramsay, who verbally acknowledged these results. Electronically Signed   By: Nelson Chimes M.D.   On: 04/27/2016 16:37        Scheduled Meds: . ALPRAZolam  1 mg Oral BID  . carbidopa-levodopa  1 tablet Oral TID  . clopidogrel  75 mg Oral Daily  . enoxaparin (LOVENOX) injection  40 mg Subcutaneous Q24H  .  morphine injection  1 mg Intravenous Once  . nortriptyline  25 mg Oral QHS  . ofloxacin  1 drop Right Eye QID  . pantoprazole  40 mg Oral Daily  . perphenazine  2 mg Oral QHS  . pravastatin  10 mg Oral q1800  . prednisoLONE acetate  1 drop Right Eye Q2H while awake   Continuous Infusions: . sodium chloride 65 mL/hr at 04/28/16 0347     LOS: 0 days    Time spent: 35 min    [place], DO [facility] Pager [phone]  If 7PM-7AM, please contact night-coverage [email or web address] Password [facility] [place] 04/28/2016, 10:47 AM

## 2016-04-29 ENCOUNTER — Inpatient Hospital Stay (HOSPITAL_COMMUNITY): Payer: Medicare Other

## 2016-04-29 ENCOUNTER — Telehealth: Payer: Self-pay | Admitting: Emergency Medicine

## 2016-04-29 DIAGNOSIS — I6789 Other cerebrovascular disease: Secondary | ICD-10-CM

## 2016-04-29 LAB — ECHOCARDIOGRAM COMPLETE
E decel time: 246 msec
E/e' ratio: 8.44
FS: 17 % — AB (ref 28–44)
Height: 60 in
IV/PV OW: 1.12
LA diam end sys: 32 mm
LA diam index: 1.85 cm/m2
LA vol A4C: 44.1 ml
LA vol index: 19.2 mL/m2
LA vol: 33.2 mL
LASIZE: 32 mm
LV E/e'average: 8.44
LV TDI E'MEDIAL: 4.79
LVEEMED: 8.44
LVELAT: 7.83 cm/s
LVOT SV: 66 mL
LVOT VTI: 23.1 cm
LVOT area: 2.84 cm2
LVOTD: 19 mm
LVOTPV: 94.2 cm/s
MV Dec: 246
MV pk A vel: 99.2 m/s
MV pk E vel: 66.1 m/s
P 1/2 time: 504 ms
PW: 13.2 mm — AB (ref 0.6–1.1)
RV LATERAL S' VELOCITY: 10.1 cm/s
RV TAPSE: 19.2 mm
TDI e' lateral: 7.83
WEIGHTICAEL: 2662.4 [oz_av]

## 2016-04-29 LAB — HEMOGLOBIN A1C
HEMOGLOBIN A1C: 5.7 % — AB (ref 4.8–5.6)
MEAN PLASMA GLUCOSE: 117 mg/dL

## 2016-04-29 LAB — VAS US CAROTID
LEFT ECA DIAS: -11 cm/s
LEFT VERTEBRAL DIAS: 17 cm/s
LICADDIAS: -23 cm/s
LICADSYS: -125 cm/s
LICAPSYS: -82 cm/s
Left CCA dist dias: 13 cm/s
Left CCA dist sys: 77 cm/s
Left CCA prox dias: 8 cm/s
Left CCA prox sys: 81 cm/s
Left ICA prox dias: -16 cm/s
RIGHT ECA DIAS: -5 cm/s
RIGHT VERTEBRAL DIAS: 9 cm/s
Right CCA prox dias: 11 cm/s
Right CCA prox sys: 90 cm/s
Right cca dist sys: -57 cm/s

## 2016-04-29 MED ORDER — ALPRAZOLAM 0.5 MG PO TABS
0.5000 mg | ORAL_TABLET | Freq: Two times a day (BID) | ORAL | Status: DC
  Administered 2016-04-29 – 2016-04-30 (×2): 0.5 mg via ORAL
  Filled 2016-04-29 (×2): qty 1

## 2016-04-29 MED ORDER — HYDROXYZINE HCL 10 MG PO TABS: 10.0000 mg | ORAL_TABLET | Freq: Three times a day (TID) | ORAL | Status: DC | PRN

## 2016-04-29 MED ORDER — RESOURCE THICKENUP CLEAR PO POWD
ORAL | Status: DC | PRN
  Filled 2016-04-29: qty 125

## 2016-04-29 MED ORDER — ORAL CARE MOUTH RINSE
15.0000 mL | Freq: Two times a day (BID) | OROMUCOSAL | Status: DC
  Administered 2016-04-30 (×2): 15 mL via OROMUCOSAL

## 2016-04-29 NOTE — Clinical Social Work Note (Signed)
Clinical Social Work Assessment  Patient Details  Name: Sarah [name] MRN: [id number] Date of Birth: 1926-04-03  Date of referral:  04/29/16               Reason for consult:  Facility Placement                Permission sought to share information with:  Chartered certified accountant granted to share information::  Yes, Verbal Permission Granted  Name::     Chartered certified accountant::     Relationship::  dtr  Contact Information:     Housing/Transportation Living arrangements for the past 2 months:  [place] of Information:  Patient, Adult Children Patient Interpreter Needed:  None Criminal Activity/Legal Involvement Pertinent to Current Situation/Hospitalization:  No - Comment as needed Significant Relationships:  Adult Children Lives with:  Self Do you feel safe going back to the place where you live?  No Need for family participation in patient care:  Yes (Comment) (some help with care coordination)  Care giving concerns:  Pt lives at home alone- has help from caregiver from 10am-4pm daily but is alone otherwise.  Pt with significant change in mobility following a stroke and pt dtr does not think pt could return home safely at this time.   Social Worker assessment / plan:  CSW spoke with pt and pt dtr concerning PT recommendation for rehab at time of DC.   Pt alert and oriented and participated in the beginning but very lethargic and drifted to sleep during conversation.    CSW explained CIR vs SNF and likelihood that pt would not be a candidate for CIR due to need to do 3 hours of therapy a day.  CSW provided dtr with list of SNFs in the area and encouraged her to look up reviews and discuss with other healthcare providers.  Employment status:  Retired Forensic scientist:  [employer] PT Recommendations:  [place], [place] / Referral to community resources:  [place]  Patient/Family's Response to  care:  Pt was not interested in SNF when first brought up and stating she would want to return home with caregiving.  Pt dtr, however, very adamant that patient would be unsafe to return home with current impairment.  Patient/Family's Understanding of and Emotional Response to Diagnosis, Current Treatment, and Prognosis:  [name] has good understanding of pt condition at this time (pt had been through a previous stroke years ago) but does express concerns with pt eating (coughing when she eats) and incontinence.  Emotional Assessment Appearance:  Appears stated age Attitude/Demeanor/Rapport:  Lethargic Affect (typically observed):  Appropriate Orientation:  Oriented to Self, Oriented to Place, Oriented to Situation, Oriented to  Time Alcohol / Substance use:  Not Applicable Psych involvement (Current and /or in the community):  No (Comment)  Discharge Needs  Concerns to be addressed:  Care Coordination Readmission within the last 30 days:  No Current discharge risk:  Physical Impairment Barriers to Discharge:  Continued Medical Work up   Jorge Ny, LCSW 04/29/2016, 11:32 AM

## 2016-04-29 NOTE — Evaluation (Signed)
Speech Language Pathology Evaluation Patient Details Name: Sarah [name] MRN: [id number] DOB: [birth year] Today's Date: 04/29/2016 Time: [id number] SLP Time Calculation (min) (ACUTE ONLY): 39 min  Problem List:  Patient Active Problem List   Diagnosis Date Noted  . Acute CVA (cerebrovascular accident) ([place])   . Dysphagia, post-stroke   . History of cerebrovascular accident (CVA) with residual deficit   . Benign essential HTN   . Slurred speech 04/27/2016  . CVA (cerebral vascular accident) ([place]) 04/27/2016  . Hypertension 04/27/2016  . Dysarthria 04/27/2016  . History of stroke 04/27/2016  . Acute focal neurological deficit 04/27/2016  . Pansinusitis 10/20/2015  . Primary Parkinsonism ([place]) 09/26/2015  . Peripheral edema 05/12/2015  . DOE (dyspnea on exertion) 02/12/2015  . Neck pain 01/10/2015  . Routine general medical examination at a health care facility 09/17/2014  . Hyperglycemia 07/09/2014  . Acute ischemic right MCA stroke ([place]) 07/03/2014  . Degenerative arthritis of knee, bilateral 03/01/2014  . Fall 10/05/2013  . Hyponatremia 10/05/2013  . Memory deficits 10/05/2013  . Arthritis of sacroiliac joint ([place]) 09/11/2013  . Lumbosacral spondylosis without myelopathy 02/26/2013  . Abnormality of gait 12/15/2012  . Right shoulder pain   . Vitamin B12 deficiency 04/13/2012  . Hypercholesterolemia 11/05/2011  . Osteoporosis, postmenopausal 06/02/2011  . Mixed incontinence urge and stress 06/02/2011  . Anxiety   . Edema 04/06/2011  . Tachycardia 04/06/2011  . Osteoarthritis    Past Medical History:  Past Medical History:  Diagnosis Date  . Bladder incontinence   . Chest pain    S/P nuclear 07/17/2008  normal; EF 88% with no ischemia  . Chronic venous insufficiency    BLE edema, chronic, neg venous US 2/13  . Constipation    hx of  . Depression   . Diverticulitis   . Esophageal stricture    s/p dilation 04/2012  . GERD (gastroesophageal reflux disease)   .  History of chicken pox   . Hyperlipidemia   . Hypertension   . Osteoarthritis   . Osteoporosis, postmenopausal 06/02/2011  . Polyp of colon   . Stroke [facility]) 07/03/2014   Past Surgical History:  Past Surgical History:  Procedure Laterality Date  . APPENDECTOMY     age 82  . COLONOSCOPY    . POLYPECTOMY    . TUBAL LIGATION    . UPPER GASTROINTESTINAL ENDOSCOPY     HPI:  Pt is an 83 y.o. female who presented to the ED with acute slurred speech and drooling while at her PCP visit. Pt has a PMH significant for Parkinson's, stroke with residual weakness, depression, and anxiety. CT negative for acute abnormality. MRI revealing subcentimeter acute/early subacute infarct within L frontal centrum semiovale.   Assessment / Plan / Recommendation Clinical Impression  Pt has reduced sustained attention and working memory, often losing her train of thought during conversation. Moderate dysarthria and word-finding errors make communication more challenging as well. She has good intellectual and emergent awareness, but needs Min-Mod cues for anticipatory awareness of deficits. Recommend SLP f/u to maximize functional cognition and communication.    SLP Assessment  Patient needs continued Speech Lanaguage Pathology Services    Follow Up Recommendations  Inpatient Rehab    Frequency and Duration min 2x/week  2 weeks      SLP Evaluation Cognition  Overall Cognitive Status: Impaired/Different from baseline Arousal/Alertness: Awake/alert Orientation Level: Oriented X4 Attention: Sustained Sustained Attention: Impaired Sustained Attention Impairment: Verbal basic Memory: Impaired Memory Impairment: Decreased recall of new information Awareness: Impaired Awareness  Impairment: Anticipatory impairment Problem Solving: Impaired Problem Solving Impairment: Functional basic Safety/Judgment: Impaired       Comprehension  Auditory Comprehension Overall Auditory Comprehension: Appears within  functional limits for tasks assessed (basic tasks)    Expression Expression Primary Mode of Expression: Verbal Verbal Expression Overall Verbal Expression: Impaired (word finding errors in conversation)   Oral / Motor  Oral Motor/Sensory Function Overall Oral Motor/Sensory Function:  (right facial droop, mild baseline left weakness) Motor Speech Overall Motor Speech: Impaired Respiration: Within functional limits Phonation: Normal Resonance: Within functional limits Articulation: Impaired Level of Impairment: Conversation Intelligibility: Intelligibility reduced Conversation: 75-100% accurate   GO                    Germain Osgood 04/29/2016, 1:40 PM  Germain Osgood, M.A. CCC-SLP [phone]

## 2016-04-29 NOTE — Progress Notes (Addendum)
STROKE TEAM PROGRESS NOTE   SUBJECTIVE (INTERVAL HISTORY) PT and speech are at the bedside. PT reported that pt does not have left leg weakness but her knee did buckle once on walking. Pt still has dysarthria and similar to yesterday. No acute event overnight.     OBJECTIVE Temp:  [97.6 F (36.4 C)-98.6 F (37 C)] 98.6 F (37 C) (02/01 2055) Pulse Rate:  [80-87] 85 (02/01 2055) Cardiac Rhythm: Heart block;Normal sinus rhythm (02/01 0700) Resp:  [18-20] 18 (02/01 2055) BP: (119-147)/(50-65) 147/62 (02/01 2055) SpO2:  [92 %-98 %] 98 % (02/01 2055)  CBC:   Recent Labs Lab 04/27/16 1631 04/27/16 1638  WBC 5.9  --   NEUTROABS 3.4  --   HGB 12.4 13.6  HCT 38.8 40.0  MCV 93.3  --   PLT 297  --     Basic Metabolic Panel:   Recent Labs Lab 04/27/16 1631 04/27/16 1638  NA 132* 132*  K 4.1 4.1  CL 97* 94*  CO2 27  --   GLUCOSE 115* 112*  BUN 15 17  CREATININE 0.87 0.80  CALCIUM 9.2  --     Lipid Panel:     Component Value Date/Time   CHOL 139 04/28/2016 0725   TRIG 61 04/28/2016 0725   HDL 70 04/28/2016 0725   CHOLHDL 2.0 04/28/2016 0725   VLDL 12 04/28/2016 0725   LDLCALC 57 04/28/2016 0725   HgbA1c:  Lab Results  Component Value Date   HGBA1C 5.7 (H) 04/28/2016   Urine Drug Screen: No results found for: LABOPIA, COCAINSCRNUR, LABBENZ, AMPHETMU, THCU, LABBARB    IMAGING I have personally reviewed the radiological images below and agree with the radiology interpretations.  Ct Head Code Stroke W/o Cm 04/27/2016 1. No acute finding by CT. Widespread chronic small-vessel ischemic changes. Right sphenoid wing meningioma as seen previously. 2. ASPECTS is 10   Mr Brain Wo Contrast Mr Jodene Nam Head/brain Wo Cm 04/27/2016 1. Subcentimeter acute/early subacute infarct within the left frontal centrum semiovale. No acute hemorrhage. 2. Stable advanced chronic microvascular ischemic changes and moderate volume loss of the brain. 3. No evidence of large vessel occlusion,  or aneurysm of the circle of Willis. Intracranial atherosclerosis with short segments of stenosis most pronounced in the left posterior cerebral artery.   Carotid Doppler   There is 1-39% bilateral ICA stenosis. Vertebral artery flow is antegrade.    TTE Left ventricle: The cavity size was normal. Wall thickness was   increased in a pattern of mild LVH. Systolic function was normal.   The estimated ejection fraction was in the range of 55% to 60%.   Wall motion was normal; there were no regional wall motion   abnormalities. Doppler parameters are consistent with abnormal   left ventricular relaxation (grade 1 diastolic dysfunction). - Aortic valve: There was mild regurgitation. - Mitral valve: Calcified annulus. Impressions: - Normal LV systolic function; mild LVH; grade 1 diastolic   dysfunction; calcified aortic valve with mild AI.   PHYSICAL EXAM  Temp:  [97.6 F (36.4 C)-98.6 F (37 C)] 98.6 F (37 C) (02/01 2055) Pulse Rate:  [80-87] 85 (02/01 2055) Resp:  [18-20] 18 (02/01 2055) BP: (119-147)/(50-65) 147/62 (02/01 2055) SpO2:  [92 %-98 %] 98 % (02/01 2055)  General - Well nourished, well developed, in no apparent distress.  Ophthalmologic - Fundi not visualized due to eye movement.  Cardiovascular - Regular rate and rhythm.  Mental Status -  Level of arousal and orientation to time, place,  and person were intact. Language including expression, naming, repetition, comprehension was assessed and found intact. Fund of Knowledge was assessed and was intact.  Cranial Nerves II - XII - II - Visual field intact OU. III, IV, VI - Extraocular movements intact. V - Facial sensation intact bilaterally. VII - right facial droop. VIII - Hearing & vestibular intact bilaterally. X - Palate elevates symmetrically, mild dysarthria. XI - Chin turning & shoulder shrug intact bilaterally. XII - Tongue protrusion intact.  Motor Strength - The patient's strength was 5/5 BUEs and  4+/5 BLEs, symmetrical and pronator drift was absent.  Bulk was normal and fasciculations were absent.   Motor Tone - Muscle tone was assessed at the neck and appendages and was normal.  Reflexes - The patient's reflexes were 1+ in all extremities and she had no pathological reflexes.  Sensory - Light touch, temperature/pinprick were assessed and were symmetrical.    Coordination - The patient had normal movements in the hands and feet with no ataxia or dysmetria.  Tremor was absent.  Gait and Station - not tested   ASSESSMENT/PLAN Ms. Sarah [name] is a 83 y.o. female with history of previous stroke, HTN, HLD, Parkinson's disease, depression, and anxiety presenting with slurred speech, right facial weakness. She did not receive IV t-PA due to mild symptoms.   Stroke:  small left frontal CR infarct secondary to small vessel disease source  Resultant  Right facial droop and dysarthria  MRI  subcentimeter left frontal CR infarct  MRA left PCA stenosis  Carotid Doppler  no significant stenosis  2D Echo EF 55-60%  LDL 57  HgbA1c 5.7  Lovenox 40 mg sq daily for VTE prophylaxis DIET - DYS 1 Room service appropriate? Yes; Fluid consistency: Nectar Thick  clopidogrel 75 mg daily prior to admission, now on clopidogrel 75 mg daily. Recommend to add ASA for DAPT for 3 months and the  plavix alone.  Patient counseled to be compliant with her antithrombotic medications  Ongoing aggressive stroke risk factor management  Therapy recommendations:  PT recommends CIR, OT recommends home health  Disposition:  Pending  ? Left leg weakness ?   Subjective complain of left leg weakness, not present on exam  PT also confirmed that pt does not have left leg weakness  Could be related to her left knee pain pending knee injection prior to admission vs. Related to PD with rigidity missing doses of sinemet during admission  Can not be explained by the  stroke  Hypertension  Stable Permissive hypertension (OK if < 220/120) but gradually normalize in 5-7 days Long-term BP goal normotensive  Hyperlipidemia  Home meds:  Pravachol 10, resumed in hospital  LDL 57, goal < 70  Continue statin at discharge  Other Stroke Risk Factors  Advanced age  Obesity, Body mass index is 32.5 kg/m., recommend weight loss, diet and exercise as appropriate   Hx stroke/TIA  06/2014 - right posterior frontal lobe infarct in setting of severe R M2 stenosis. Infarct secondary to atherosclerosis and small vessel disease. Changed from aspirin to Plavix.  Other Active Problems  Parkinson's disease - follows with Dr. Posey Pronto at [facility] - on senimet  Hyponatremia  Anxiety, depression  Hospital day # 1  Neurology will sign off. Please call with questions. Pt will follow up with Dr. Posey Pronto at [facility] on 06/18/16. Thanks for the consult.   Rosalin Hawking, MD PhD Stroke Neurology 04/29/2016 9:33 PM  To contact Stroke Continuity provider, please refer to [email or web address] After  hours, contact General Neurology

## 2016-04-29 NOTE — Progress Notes (Signed)
Speech Language Pathology Treatment: Dysphagia  Patient Details Name: Sarah [name] MRN: [id number] DOB: 08-30-1926 Today's Date: 04/29/2016 Time: [id number] SLP Time Calculation (min) (ACUTE ONLY): 25 min  Assessment / Plan / Recommendation Clinical Impression  SLP was paged by RN due to increased difficulty with swallowing today as compared to previous date. Pt now has immediate coughing with thin liquids and moderate left-sided pocketing even with Dys 2 textures. SLP provided Max faded to Mod cues for slower pacing and more thorough oral clearance prior to taking an additional bite. Recommend Dys 1 diet and nectar thick liquids. Will continue to follow.    HPI HPI: Pt is an 83 y.o. female who presented to the ED with acute slurred speech and drooling while at her PCP visit. Pt has a PMH significant for Parkinson's, stroke with residual weakness, depression, and anxiety. CT negative for acute abnormality. MRI revealing subcentimeter acute/early subacute infarct within L frontal centrum semiovale.      SLP Plan  Continue with current plan of care     Recommendations  Diet recommendations: Dysphagia 1 (puree);Nectar-thick liquid Liquids provided via: Cup;Straw Medication Administration: Whole meds with puree Supervision: Staff to assist with self feeding;Full supervision/cueing for compensatory strategies Compensations: Slow rate;Small sips/bites;Follow solids with liquid Postural Changes and/or Swallow Maneuvers: Seated upright 90 degrees                Oral Care Recommendations: Oral care BID Follow up Recommendations: Inpatient Rehab Plan: Continue with current plan of care       GO                Germain Osgood 04/29/2016, 1:09 PM  Germain Osgood, M.A. CCC-SLP [phone]

## 2016-04-29 NOTE — Progress Notes (Signed)
CSW provided pt and pt dtr with list of bed offers- no choice at this time  Jorge Ny, [place] Social Worker [phone]

## 2016-04-29 NOTE — Telephone Encounter (Signed)
Patients daughter from out of town called. Pt has been admitted to the hospital for a stroke. They are going to be discharged by the weekend. She is wondering if she can get a recommendation for a facilty to put her in. They would like to go look at a few places tomorrow before she is discharged. The hospital is not allowed to recommend anybody. Please get back to them ASAP so they can start looking into somewhere for her to go. Please advise thanks.

## 2016-04-29 NOTE — Telephone Encounter (Signed)
I'm not really allowed to recommend placed either but I would tour the facility first. It depends on which places have an opening so they can help her to regain strength.

## 2016-04-29 NOTE — NC FL2 (Signed)
[place] MEDICAID FL2 LEVEL OF CARE SCREENING TOOL     IDENTIFICATION  Patient Name: Sarah [name] Birthdate: 01/14/1927 Sex: female Admission Date (Current Location): 04/27/2016  [facility] and [place] Number:  Herbalist and Address:  The . [facility], [place] [address], [place], [place] [address]      Provider Number: [id number]  Attending Physician Name and Address:  Geradine Girt, DO  Relative Name and Phone Number:       Current Level of Care: Hospital Recommended Level of Care: [place] Prior Approval Number:    Date Approved/Denied:   PASRR Number:    Discharge Plan: SNF    Current Diagnoses: Patient Active Problem List   Diagnosis Date Noted  . Acute CVA (cerebrovascular accident) ([place])   . Dysphagia, post-stroke   . History of cerebrovascular accident (CVA) with residual deficit   . Benign essential HTN   . Slurred speech 04/27/2016  . CVA (cerebral vascular accident) ([place]) 04/27/2016  . Hypertension 04/27/2016  . Dysarthria 04/27/2016  . History of stroke 04/27/2016  . Acute focal neurological deficit 04/27/2016  . Pansinusitis 10/20/2015  . Primary Parkinsonism ([place]) 09/26/2015  . Peripheral edema 05/12/2015  . DOE (dyspnea on exertion) 02/12/2015  . Neck pain 01/10/2015  . Routine general medical examination at a health care facility 09/17/2014  . Hyperglycemia 07/09/2014  . Acute ischemic right MCA stroke ([place]) 07/03/2014  . Degenerative arthritis of knee, bilateral 03/01/2014  . Fall 10/05/2013  . Hyponatremia 10/05/2013  . Memory deficits 10/05/2013  . Arthritis of sacroiliac joint ([place]) 09/11/2013  . Lumbosacral spondylosis without myelopathy 02/26/2013  . Abnormality of gait 12/15/2012  . Right shoulder pain   . Vitamin B12 deficiency 04/13/2012  . Hypercholesterolemia 11/05/2011  . Osteoporosis, postmenopausal 06/02/2011  . Mixed incontinence urge and stress 06/02/2011  . Anxiety   . Edema  04/06/2011  . Tachycardia 04/06/2011  . Osteoarthritis     Orientation RESPIRATION BLADDER Height & Weight     Self, Time, Situation, Place  Normal Incontinent Weight: 166 lb 6.4 oz (75.5 kg) Height:  5' (152.4 cm)  BEHAVIORAL SYMPTOMS/MOOD NEUROLOGICAL BOWEL NUTRITION STATUS      Continent Diet (see DC summary)  AMBULATORY STATUS COMMUNICATION OF NEEDS Skin   Extensive Assist Verbally Normal                       Personal Care Assistance Level of Assistance  Bathing, Dressing, Feeding Bathing Assistance: Maximum assistance Feeding assistance: Limited assistance Dressing Assistance: Maximum assistance     Functional Limitations Info             SPECIAL CARE FACTORS FREQUENCY  PT (By licensed PT), OT (By licensed OT)     PT Frequency: 5/wk OT Frequency: 5/wk            Contractures      Additional Factors Info  Code Status, Allergies, Psychotropic Code Status Info: FULL Allergies Info: Lipitor Atorvastatin Calcium, Lovastatin, Celebrex Celecoxib, Latex, Macrobid Nitrofurantoin Monohyd Macro, Other Psychotropic Info: xanax         Current Medications (04/29/2016):  This is the current hospital active medication list Current Facility-Administered Medications  Medication Dose Route Frequency Provider Last Rate Last Dose  . acetaminophen (TYLENOL) tablet 650 mg  650 mg Oral Q4H PRN Vianne Bulls, MD   650 mg at 04/29/16 0023   Or  . acetaminophen (TYLENOL) solution 650 mg  650 mg Per Tube  Q4H PRN Vianne Bulls, MD       Or  . acetaminophen (TYLENOL) suppository 650 mg  650 mg Rectal Q4H PRN Vianne Bulls, MD      . ALPRAZolam Duanne Moron) tablet 1 mg  1 mg Oral BID Vianne Bulls, MD   1 mg at 04/29/16 0917  . aspirin EC tablet 325 mg  325 mg Oral Daily Rosalin Hawking, MD   325 mg at 04/29/16 0917  . carbidopa-levodopa (SINEMET IR) 25-100 MG per tablet immediate release 1 tablet  1 tablet Oral TID Vianne Bulls, MD   1 tablet at 04/29/16 0917  . clopidogrel  (PLAVIX) tablet 75 mg  75 mg Oral Daily Vianne Bulls, MD   75 mg at 04/29/16 0917  . enoxaparin (LOVENOX) injection 40 mg  40 mg Subcutaneous Q24H Vianne Bulls, MD   40 mg at 04/29/16 0023  . labetalol (NORMODYNE,TRANDATE) injection 5 mg  5 mg Intravenous Q2H PRN Ilene Qua Opyd, MD      . LORazepam (ATIVAN) injection 1 mg  1 mg Intravenous Q4H PRN Ilene Qua Opyd, MD      . morphine 2 MG/ML injection 1 mg  1 mg Intravenous Once Gardiner Barefoot, NP      . nortriptyline (PAMELOR) capsule 25 mg  25 mg Oral QHS Vianne Bulls, MD   25 mg at 04/29/16 0022  . ofloxacin (OCUFLOX) 0.3 % ophthalmic solution 1 drop  1 drop Right Eye QID Vianne Bulls, MD   1 drop at 04/29/16 [phone]  . pantoprazole (PROTONIX) EC tablet 40 mg  40 mg Oral Daily Vianne Bulls, MD   40 mg at 04/29/16 0917  . perphenazine (TRILAFON) tablet 2 mg  2 mg Oral QHS Vianne Bulls, MD   2 mg at 04/29/16 0022  . pravastatin (PRAVACHOL) tablet 10 mg  10 mg Oral q1800 Vianne Bulls, MD   10 mg at 04/28/16 1739  . prednisoLONE acetate (PRED FORTE) 1 % ophthalmic suspension 1 drop  1 drop Right Eye Q2H while awake Vianne Bulls, MD   1 drop at 04/29/16 [phone]  . senna-docusate (Senokot-S) tablet 1 tablet  1 tablet Oral QHS PRN Ilene Qua Opyd, MD      . triazolam (HALCION) tablet 0.125 mg  0.125 mg Oral QHS PRN Vianne Bulls, MD         Discharge Medications: Please see discharge summary for a list of discharge medications.  Relevant Imaging Results:  Relevant Lab Results:   Additional Information SS#: [id number]  Jorge Ny, LCSW

## 2016-04-29 NOTE — Progress Notes (Signed)
PROGRESS NOTE    Sarah [name]  [id number] DOB: 1926-12-26 DOA: 04/27/2016 PCP: Hoyt Koch, MD   Outpatient Specialists:     Brief Narrative:  Sarah [name] is a 83 y.o. female with medical history significant for Parkinson's, stroke with residual weakness, depression, and anxiety, now presenting to the emergency department at the direction of her PCP for evaluation of acute slurred speech and drooling. Patient has history of CVA with residual left-sided weakness and chronic left facial droop, but was noted this morning after breakfast to develop slurring of her speech and drooling. She was taken to see her primary care physician today for a general wellness exam, but her PCP was more concerned with the new slurring of speech and new drooling. MRI showed new CVA. Work up in progress-- patient will need eventual d/c to SNF   Assessment & Plan:   Principal Problem:   CVA (cerebral vascular accident) [facility]) Active Problems:   Anxiety   Hyponatremia   Primary Parkinsonism ([place])   Hypertension   Dysarthria   History of stroke   Acute focal neurological deficit   Acute CVA (cerebrovascular accident) ([place])   Dysphagia, post-stroke   History of cerebrovascular accident (CVA) with residual deficit   Benign essential HTN   Acute dysarthria, drooling  - Pt with residual left-sided motor weakness from ischemic left MCA infarct in April 2016  - Neurology is consulting and much appreciated; tPA not given as sxs were resolving  - MRI: Subcentimeter acute/early subacute infarct within the left frontal centrum semiovale. No acute hemorrhage -echo: pending -carotids: no significant stenosis -HGbA1C: 5.7 -FLP: LDL 57-- continue statin - Continue Plavix and statin  -- ASA added for DAPT for 3 months  Hyponatremia  - Serum sodium 132 on admission  - Possibly secondary to nortriptyline and/or perphenazine - This appears to be chronic and stable    Hypertension  - BP  elevated to 190/70 on admission in setting of suspected ischemic CVA  - Plan to permit HTN to [id number] for now in the acute CVA  Hx of CVA  - Left MCA ischemic infarct in April 2016 with residual left-sided motor weakness  - Continue Plavix and statin    Anxiety, depression  - Appears to be stable  - Continue nortriptyline, perphenazine, and prn triazolam    Parkinsonism  - Pt tremulous on admission  - Neuro is consulting and much appreciated  - Continue Sinemet      DVT prophylaxis:  Lovenox   Code Status: Full Code   Family Communication: Patient/daughter updated  Disposition Plan:     Consultants:   neuro     Subjective: No SOB, no CP -daughter reports some issue with swallowing today  Objective: Vitals:   04/28/16 2101 04/29/16 0142 04/29/16 0629 04/29/16 1121  BP: (!) 147/69 (!) 119/53 125/61 (!) 147/50  Pulse: 78 82 80 81  Resp: 20 20 18 20   Temp: 98.3 F (36.8 C) 98.6 F (37 C) 98.4 F (36.9 C) 98.6 F (37 C)  TempSrc: Oral Oral Oral Oral  SpO2: 96% 92% 93% 97%  Weight:      Height:       No intake or output data in the 24 hours ending 04/29/16 1142 Filed Weights   04/27/16 2048  Weight: 75.5 kg (166 lb 6.4 oz)    Examination:  General exam: Appears calm and comfortable  Respiratory system: Clear to auscultation. Respiratory effort normal. Cardiovascular system: S1 & S2 heard, RRR. No JVD,  murmurs, rubs, gallops or clicks. No pedal edema. Gastrointestinal system: Abdomen is nondistended, soft and nontender. No organomegaly or masses felt. Normal bowel sounds heard. Central nervous system: Alert and oriented. No focal neurological deficits.     Data Reviewed: I have personally reviewed following labs and imaging studies  CBC:  Recent Labs Lab 04/27/16 1631 04/27/16 1638  WBC 5.9  --   NEUTROABS 3.4  --   HGB 12.4 13.6  HCT 38.8 40.0  MCV 93.3  --   PLT 297  --    Basic Metabolic Panel:  Recent Labs Lab  04/27/16 1631 04/27/16 1638  NA 132* 132*  K 4.1 4.1  CL 97* 94*  CO2 27  --   GLUCOSE 115* 112*  BUN 15 17  CREATININE 0.87 0.80  CALCIUM 9.2  --    GFR: Estimated Creatinine Clearance: 45 mL/min (by C-G formula based on SCr of 0.8 mg/dL). Liver Function Tests:  Recent Labs Lab 04/27/16 1631  AST 11*  ALT 9*  ALKPHOS 46  BILITOT 0.3  PROT 6.4*  ALBUMIN 3.5   No results for input(s): LIPASE, AMYLASE in the last 168 hours. No results for input(s): AMMONIA in the last 168 hours. Coagulation Profile:  Recent Labs Lab 04/27/16 1631  INR 0.98   Cardiac Enzymes: No results for input(s): CKTOTAL, CKMB, CKMBINDEX, TROPONINI in the last 168 hours. BNP (last 3 results) No results for input(s): PROBNP in the last 8760 hours. HbA1C:  Recent Labs  04/28/16 0725  HGBA1C 5.7*   CBG: No results for input(s): GLUCAP in the last 168 hours. Lipid Profile:  Recent Labs  04/28/16 0725  CHOL 139  HDL 70  LDLCALC 57  TRIG 61  CHOLHDL 2.0   Thyroid Function Tests: No results for input(s): TSH, T4TOTAL, FREET4, T3FREE, THYROIDAB in the last 72 hours. Anemia Panel: No results for input(s): VITAMINB12, FOLATE, FERRITIN, TIBC, IRON, RETICCTPCT in the last 72 hours. Urine analysis:    Component Value Date/Time   COLORURINE YELLOW 04/27/2016 2020   APPEARANCEUR HAZY (A) 04/27/2016 2020   LABSPEC 1.008 04/27/2016 2020   PHURINE 7.0 04/27/2016 2020   GLUCOSEU NEGATIVE 04/27/2016 2020   HGBUR NEGATIVE 04/27/2016 2020   BILIRUBINUR NEGATIVE 04/27/2016 2020   KETONESUR NEGATIVE 04/27/2016 2020   PROTEINUR NEGATIVE 04/27/2016 2020   UROBILINOGEN 0.2 07/04/2014 0945   NITRITE NEGATIVE 04/27/2016 2020   LEUKOCYTESUR MODERATE (A) 04/27/2016 2020     )No results found for this or any previous visit (from the past 240 hour(s)).    Anti-infectives    None       Radiology Studies: Mr Brain 44 Contrast  Result Date: 04/27/2016 CLINICAL DATA:  83 y/o  F; acute slurred  speech and drooling. EXAM: MRI HEAD WITHOUT CONTRAST MRA HEAD WITHOUT CONTRAST TECHNIQUE: Multiplanar, multiecho pulse sequences of the brain and surrounding structures were obtained without intravenous contrast. Angiographic images of the head were obtained using MRA technique without contrast. COMPARISON:  04/27/2016 CT head. MRI head and MRA head dated 07/03/2014. FINDINGS: MRI HEAD FINDINGS Brain: Left frontal centrum semiovale 5 mm focus of diffusion restriction. Stable T2 FLAIR hyperintense signal abnormality in white matter is nonspecific but consistent with advanced chronic microvascular ischemic change. Moderate brain parenchymal volume loss. No abnormal susceptibility hypointensity to indicate acute intracranial hemorrhage. No focal mass effect, hydrocephalus, or extra-axial collection. Vascular: As below. Skull and upper cervical spine: Normal marrow signal. Sinuses/Orbits: Negative. Other: None. MRA HEAD FINDINGS Internal carotid arteries: Patent. Mild bilateral ICA  irregularity in the cavernous segments without significant stenosis. Anterior cerebral arteries:  Patent. Middle cerebral arteries: Patent. Anterior communicating artery: Patent. Posterior communicating arteries: Patent. Left P1 and P2 short segments of moderate stenosis. Posterior cerebral arteries:  Patent. Basilar artery:  Patent. Vertebral arteries:  Patent. No large vessel occlusion or aneurysm. IMPRESSION: 1. Subcentimeter acute/early subacute infarct within the left frontal centrum semiovale. No acute hemorrhage. 2. Stable advanced chronic microvascular ischemic changes and moderate volume loss of the brain. 3. No evidence of large vessel occlusion, or aneurysm of the circle of Willis. Intracranial atherosclerosis with short segments of stenosis most pronounced in the left posterior cerebral artery. These results will be called to the ordering clinician or representative by the Radiologist Assistant, and communication documented in the  PACS or zVision Dashboard. Electronically Signed   By: Kristine Garbe M.D.   On: 04/27/2016 23:05   Mr Jodene Nam Head/brain [id number] Cm  Result Date: 04/27/2016 CLINICAL DATA:  83 y/o  F; acute slurred speech and drooling. EXAM: MRI HEAD WITHOUT CONTRAST MRA HEAD WITHOUT CONTRAST TECHNIQUE: Multiplanar, multiecho pulse sequences of the brain and surrounding structures were obtained without intravenous contrast. Angiographic images of the head were obtained using MRA technique without contrast. COMPARISON:  04/27/2016 CT head. MRI head and MRA head dated 07/03/2014. FINDINGS: MRI HEAD FINDINGS Brain: Left frontal centrum semiovale 5 mm focus of diffusion restriction. Stable T2 FLAIR hyperintense signal abnormality in white matter is nonspecific but consistent with advanced chronic microvascular ischemic change. Moderate brain parenchymal volume loss. No abnormal susceptibility hypointensity to indicate acute intracranial hemorrhage. No focal mass effect, hydrocephalus, or extra-axial collection. Vascular: As below. Skull and upper cervical spine: Normal marrow signal. Sinuses/Orbits: Negative. Other: None. MRA HEAD FINDINGS Internal carotid arteries: Patent. Mild bilateral ICA irregularity in the cavernous segments without significant stenosis. Anterior cerebral arteries:  Patent. Middle cerebral arteries: Patent. Anterior communicating artery: Patent. Posterior communicating arteries: Patent. Left P1 and P2 short segments of moderate stenosis. Posterior cerebral arteries:  Patent. Basilar artery:  Patent. Vertebral arteries:  Patent. No large vessel occlusion or aneurysm. IMPRESSION: 1. Subcentimeter acute/early subacute infarct within the left frontal centrum semiovale. No acute hemorrhage. 2. Stable advanced chronic microvascular ischemic changes and moderate volume loss of the brain. 3. No evidence of large vessel occlusion, or aneurysm of the circle of Willis. Intracranial atherosclerosis with short segments  of stenosis most pronounced in the left posterior cerebral artery. These results will be called to the ordering clinician or representative by the Radiologist Assistant, and communication documented in the PACS or zVision Dashboard. Electronically Signed   By: Kristine Garbe M.D.   On: 04/27/2016 23:05   Ct Head Code Stroke W/o Cm  Result Date: 04/27/2016 CLINICAL DATA:  Code stroke. Slurred speech. Intermittent ruling. Right arm weakness. EXAM: CT HEAD WITHOUT CONTRAST TECHNIQUE: Contiguous axial images were obtained from the base of the skull through the vertex without intravenous contrast. COMPARISON:  08/29/2014 FINDINGS: Brain: Generalized brain atrophy. Confluent chronic small vessel ischemic changes affecting the cerebral hemispheric white matter. No evidence of cortical infarction. No mass lesion, hemorrhage, hydrocephalus or extra-axial collection. Calcified meningioma of the sphenoid wing on the right as seen previously. Vascular: There is atherosclerotic calcification of the major vessels at the base of the brain. No acute hyperdense vessel identified. Skull: Negative Sinuses/Orbits: Clear/normal Other: None significant ASPECTS ([place] Stroke Program Early CT Score) - Ganglionic level infarction (caudate, lentiform nuclei, internal capsule, insula, M1-M3 cortex): 7 - Supraganglionic infarction (M4-M6 cortex): 3 Total score (  0-10 with 10 being normal): 10 IMPRESSION: 1. No acute finding by CT. Widespread chronic small-vessel ischemic changes. Right sphenoid wing meningioma as seen previously. 2. ASPECTS is 10 These results were called by telephone at the time of interpretation on 04/27/2016 at 4:36 pm to Dr. Leonel Ramsay, who verbally acknowledged these results. Electronically Signed   By: Nelson Chimes M.D.   On: 04/27/2016 16:37        Scheduled Meds: . ALPRAZolam  1 mg Oral BID  . aspirin EC  325 mg Oral Daily  . carbidopa-levodopa  1 tablet Oral TID  . clopidogrel  75 mg Oral  Daily  . enoxaparin (LOVENOX) injection  40 mg Subcutaneous Q24H  .  morphine injection  1 mg Intravenous Once  . nortriptyline  25 mg Oral QHS  . ofloxacin  1 drop Right Eye QID  . pantoprazole  40 mg Oral Daily  . perphenazine  2 mg Oral QHS  . pravastatin  10 mg Oral q1800  . prednisoLONE acetate  1 drop Right Eye Q2H while awake   Continuous Infusions:    LOS: 1 day    Time spent: 25 min    [place], DO [facility] Pager [phone]  If 7PM-7AM, please contact night-coverage [email or web address] Password [email or web address] 04/29/2016, 11:42 AM

## 2016-04-29 NOTE — Progress Notes (Signed)
  Echocardiogram 2D Echocardiogram has been performed.  Jennette Dubin 04/29/2016, 2:04 PM

## 2016-04-29 NOTE — Progress Notes (Signed)
Inpatient Rehabilitation  Attempted to meet with patient to discuss team and rehab MD's recommendations; however, patient out of the room at a procedure will follow up today as time allows.    Carmelia Roller., CCC/SLP Admission Coordinator  [place]  Cell [phone]

## 2016-04-29 NOTE — Progress Notes (Signed)
Physical Therapy Treatment Patient Details Name: Sarah [name] MRN: [id number] DOB: September 21, 1926 Today's Date: 04/29/2016    History of Present Illness Pt is an 83 y.o. female who presented to the ED with acute slurred speech and drooling while at her PCP visit. Pt has a PMH significant for Parkinson's, stroke with residual weakness, depression, and anxiety. CT negative for acute abnormality. MRI revealing subcentimeter acute/early subacute infarct within L frontal centrum semiovale.    PT Comments    Patient progressing with automatic movement, though still slow.  Did have Parkinson's medications this AM.  Was coughing with liquids more per family and SLP in at end of tx.  Left in chair and encouraged OOB for meals.  Decr R hand awareness noted this session as well.  Continues to be appropriate for CIR rehab.  PT to follow.   Follow Up Recommendations  CIR     Equipment Recommendations  None recommended by PT    Recommendations for Other Services       Precautions / Restrictions Precautions Precautions: Fall    Mobility  Bed Mobility Overal bed mobility: Needs Assistance Bed Mobility: Supine to Sit     Supine to sit: Mod assist     General bed mobility comments: assist to lift trunk and scoot to EOB; increased time to initiate and bring legs off bed  Transfers Overall transfer level: Needs assistance Equipment used: 4-wheeled walker Transfers: Sit to/from Stand Sit to Stand: Min assist         General transfer comment: increased time to rise, assist for balance and cues and occasional assist for UE placement (R reaching below handle on walker)  Ambulation/Gait Ambulation/Gait assistance: Min assist Ambulation Distance (Feet): 20 Feet (and 5') Assistive device: 4-wheeled walker Gait Pattern/deviations: Step-through pattern;Decreased stride length;Shuffle;Wide base of support     General Gait Details: decreased safety with turning and assist for balance, safety,  cues for walker positioning; remains flexed throughout, did note L LE buckling initially   Stairs            Wheelchair Mobility    Modified Rankin (Stroke Patients Only) Modified Rankin (Stroke Patients Only) Pre-Morbid Rankin Score: Moderate disability Modified Rankin: Moderately severe disability     Balance Overall balance assessment: Needs assistance Sitting-balance support: Feet supported Sitting balance-Leahy Scale: Fair Sitting balance - Comments: reminders to keep from leaning to R   Standing balance support: Bilateral upper extremity supported;During functional activity Standing balance-Leahy Scale: Poor Standing balance comment: flexed posture; able to straighten up slightly with cues                    Cognition Arousal/Alertness: Awake/alert Behavior During Therapy: WFL for tasks assessed/performed Overall Cognitive Status: Impaired/Different from baseline Area of Impairment: Following commands;Attention   Current Attention Level: Sustained   Following Commands: Follows one step commands with increased time     Problem Solving: Slow processing;Decreased initiation      Exercises      General Comments        Pertinent Vitals/Pain Pain Assessment: No/denies pain    Home Living                      Prior Function            PT Goals (current goals can now be found in the care plan section) Progress towards PT goals: Progressing toward goals    Frequency    Min 4X/week  PT Plan Current plan remains appropriate    Co-evaluation             End of Session Equipment Utilized During Treatment: Gait belt Activity Tolerance: Patient tolerated treatment well Patient left: in chair;with call bell/phone within reach;with family/visitor present     Time: 1117-1150 PT Time Calculation (min) (ACUTE ONLY): 33 min  Charges:  $Gait Training: 8-22 mins $Therapeutic Activity: 8-22 mins                    G Codes:       Reginia Naas 05-16-16, 12:22 PM  Magda Kiel, [place] May 16, 2016

## 2016-04-30 ENCOUNTER — Encounter (HOSPITAL_COMMUNITY): Payer: Self-pay | Admitting: *Deleted

## 2016-04-30 ENCOUNTER — Inpatient Hospital Stay (HOSPITAL_COMMUNITY)
Admission: RE | Admit: 2016-04-30 | Discharge: 2016-05-13 | DRG: 092 | Disposition: A | Discharge status: Home Health | Payer: Medicare Other | Source: Intra-hospital | Attending: Physical Medicine & Rehabilitation | Admitting: Physical Medicine & Rehabilitation

## 2016-04-30 DIAGNOSIS — I69354 Hemiplegia and hemiparesis following cerebral infarction affecting left non-dominant side: Secondary | ICD-10-CM | POA: Diagnosis not present

## 2016-04-30 DIAGNOSIS — M810 Age-related osteoporosis without current pathological fracture: Secondary | ICD-10-CM | POA: Diagnosis present

## 2016-04-30 DIAGNOSIS — N390 Urinary tract infection, site not specified: Secondary | ICD-10-CM | POA: Diagnosis not present

## 2016-04-30 DIAGNOSIS — R059 Cough, unspecified: Secondary | ICD-10-CM

## 2016-04-30 DIAGNOSIS — I69391 Dysphagia following cerebral infarction: Secondary | ICD-10-CM | POA: Diagnosis not present

## 2016-04-30 DIAGNOSIS — E871 Hypo-osmolality and hyponatremia: Secondary | ICD-10-CM | POA: Diagnosis not present

## 2016-04-30 DIAGNOSIS — I63511 Cerebral infarction due to unspecified occlusion or stenosis of right middle cerebral artery: Secondary | ICD-10-CM | POA: Diagnosis present

## 2016-04-30 DIAGNOSIS — F329 Major depressive disorder, single episode, unspecified: Secondary | ICD-10-CM | POA: Diagnosis present

## 2016-04-30 DIAGNOSIS — G8929 Other chronic pain: Secondary | ICD-10-CM | POA: Diagnosis present

## 2016-04-30 DIAGNOSIS — Z9109 Other allergy status, other than to drugs and biological substances: Secondary | ICD-10-CM

## 2016-04-30 DIAGNOSIS — D62 Acute posthemorrhagic anemia: Secondary | ICD-10-CM | POA: Diagnosis not present

## 2016-04-30 DIAGNOSIS — I69322 Dysarthria following cerebral infarction: Secondary | ICD-10-CM

## 2016-04-30 DIAGNOSIS — K219 Gastro-esophageal reflux disease without esophagitis: Secondary | ICD-10-CM | POA: Diagnosis present

## 2016-04-30 DIAGNOSIS — R112 Nausea with vomiting, unspecified: Secondary | ICD-10-CM | POA: Diagnosis not present

## 2016-04-30 DIAGNOSIS — R05 Cough: Secondary | ICD-10-CM | POA: Diagnosis not present

## 2016-04-30 DIAGNOSIS — R51 Headache: Secondary | ICD-10-CM | POA: Diagnosis present

## 2016-04-30 DIAGNOSIS — R300 Dysuria: Secondary | ICD-10-CM

## 2016-04-30 DIAGNOSIS — R269 Unspecified abnormalities of gait and mobility: Principal | ICD-10-CM | POA: Diagnosis present

## 2016-04-30 DIAGNOSIS — R110 Nausea: Secondary | ICD-10-CM

## 2016-04-30 DIAGNOSIS — I10 Essential (primary) hypertension: Secondary | ICD-10-CM | POA: Diagnosis present

## 2016-04-30 DIAGNOSIS — I679 Cerebrovascular disease, unspecified: Secondary | ICD-10-CM | POA: Diagnosis present

## 2016-04-30 DIAGNOSIS — Z91040 Latex allergy status: Secondary | ICD-10-CM

## 2016-04-30 DIAGNOSIS — Z79899 Other long term (current) drug therapy: Secondary | ICD-10-CM

## 2016-04-30 DIAGNOSIS — R1310 Dysphagia, unspecified: Secondary | ICD-10-CM | POA: Diagnosis present

## 2016-04-30 DIAGNOSIS — G20C Parkinsonism, unspecified: Secondary | ICD-10-CM | POA: Diagnosis present

## 2016-04-30 DIAGNOSIS — E785 Hyperlipidemia, unspecified: Secondary | ICD-10-CM | POA: Diagnosis present

## 2016-04-30 DIAGNOSIS — I69390 Apraxia following cerebral infarction: Secondary | ICD-10-CM | POA: Diagnosis not present

## 2016-04-30 DIAGNOSIS — B9620 Unspecified Escherichia coli [E. coli] as the cause of diseases classified elsewhere: Secondary | ICD-10-CM | POA: Diagnosis not present

## 2016-04-30 DIAGNOSIS — Z7902 Long term (current) use of antithrombotics/antiplatelets: Secondary | ICD-10-CM

## 2016-04-30 DIAGNOSIS — G441 Vascular headache, not elsewhere classified: Secondary | ICD-10-CM

## 2016-04-30 DIAGNOSIS — I739 Peripheral vascular disease, unspecified: Secondary | ICD-10-CM | POA: Diagnosis present

## 2016-04-30 DIAGNOSIS — K5901 Slow transit constipation: Secondary | ICD-10-CM

## 2016-04-30 DIAGNOSIS — G20 Parkinson's disease: Secondary | ICD-10-CM | POA: Diagnosis present

## 2016-04-30 DIAGNOSIS — M545 Low back pain: Secondary | ICD-10-CM | POA: Diagnosis present

## 2016-04-30 DIAGNOSIS — G43A1 Cyclical vomiting, intractable: Secondary | ICD-10-CM | POA: Diagnosis not present

## 2016-04-30 DIAGNOSIS — H6121 Impacted cerumen, right ear: Secondary | ICD-10-CM

## 2016-04-30 DIAGNOSIS — K5900 Constipation, unspecified: Secondary | ICD-10-CM | POA: Diagnosis present

## 2016-04-30 DIAGNOSIS — I872 Venous insufficiency (chronic) (peripheral): Secondary | ICD-10-CM | POA: Diagnosis present

## 2016-04-30 DIAGNOSIS — F411 Generalized anxiety disorder: Secondary | ICD-10-CM

## 2016-04-30 LAB — CREATININE, SERUM: Creatinine, Ser: 0.68 mg/dL (ref 0.44–1.00)

## 2016-04-30 LAB — CBC
HCT: 38.2 % (ref 36.0–46.0)
Hemoglobin: 12.3 g/dL (ref 12.0–15.0)
MCH: 30 pg (ref 26.0–34.0)
MCHC: 32.2 g/dL (ref 30.0–36.0)
MCV: 93.2 fL (ref 78.0–100.0)
PLATELETS: 311 10*3/uL (ref 150–400)
RBC: 4.1 MIL/uL (ref 3.87–5.11)
RDW: 13.9 % (ref 11.5–15.5)
WBC: 6.4 10*3/uL (ref 4.0–10.5)

## 2016-04-30 MED ORDER — ASPIRIN 325 MG PO TBEC: 325.0000 mg | DELAYED_RELEASE_TABLET | Freq: Every day | ORAL | 0 refills | Status: DC

## 2016-04-30 MED ORDER — PRAVASTATIN SODIUM 20 MG PO TABS
10.0000 mg | ORAL_TABLET | Freq: Every day | ORAL | Status: DC
  Administered 2016-04-30 – 2016-05-12 (×13): 10 mg via ORAL
  Filled 2016-04-30 (×14): qty 1

## 2016-04-30 MED ORDER — TRIAZOLAM 0.125 MG PO TABS
0.1250 mg | ORAL_TABLET | Freq: Every evening | ORAL | Status: DC | PRN
  Administered 2016-05-01 – 2016-05-13 (×7): 0.125 mg via ORAL
  Filled 2016-04-30 (×8): qty 1

## 2016-04-30 MED ORDER — ACETAMINOPHEN 160 MG/5ML PO SOLN
650.0000 mg | ORAL | Status: DC | PRN
  Administered 2016-05-01: 650 mg
  Filled 2016-04-30 (×2): qty 20.3

## 2016-04-30 MED ORDER — ONDANSETRON HCL 4 MG/2ML IJ SOLN
4.0000 mg | Freq: Four times a day (QID) | INTRAMUSCULAR | Status: DC | PRN
  Administered 2016-05-01 – 2016-05-03 (×5): 4 mg via INTRAVENOUS
  Filled 2016-04-30 (×5): qty 2

## 2016-04-30 MED ORDER — CARBIDOPA-LEVODOPA 25-100 MG PO TABS
1.0000 | ORAL_TABLET | Freq: Three times a day (TID) | ORAL | Status: DC
  Administered 2016-04-30 – 2016-05-13 (×38): 1 via ORAL
  Filled 2016-04-30 (×39): qty 1

## 2016-04-30 MED ORDER — PREDNISOLONE ACETATE 1 % OP SUSP
1.0000 [drp] | Freq: Two times a day (BID) | OPHTHALMIC | Status: DC
  Filled 2016-04-30: qty 1

## 2016-04-30 MED ORDER — ASPIRIN EC 325 MG PO TBEC
325.0000 mg | DELAYED_RELEASE_TABLET | Freq: Every day | ORAL | Status: DC
  Filled 2016-04-30 (×2): qty 1

## 2016-04-30 MED ORDER — SENNOSIDES-DOCUSATE SODIUM 8.6-50 MG PO TABS
1.0000 | ORAL_TABLET | Freq: Every evening | ORAL | Status: DC | PRN
Start: 2016-04-30 — End: 2016-05-13
  Administered 2016-04-30 – 2016-05-02 (×2): 1 via ORAL
  Filled 2016-04-30 (×3): qty 1

## 2016-04-30 MED ORDER — PREDNISOLONE ACETATE 1 % OP SUSP: 1.0000 [drp] | Freq: Two times a day (BID) | OPHTHALMIC | 0 refills | Status: DC

## 2016-04-30 MED ORDER — NORTRIPTYLINE HCL 25 MG PO CAPS
25.0000 mg | ORAL_CAPSULE | Freq: Every day | ORAL | Status: DC
  Administered 2016-04-30 – 2016-05-12 (×13): 25 mg via ORAL
  Filled 2016-04-30 (×13): qty 1

## 2016-04-30 MED ORDER — ENOXAPARIN SODIUM 40 MG/0.4ML ~~LOC~~ SOLN
40.0000 mg | SUBCUTANEOUS | Status: DC
  Administered 2016-04-30 – 2016-05-12 (×13): 40 mg via SUBCUTANEOUS
  Filled 2016-04-30 (×13): qty 0.4

## 2016-04-30 MED ORDER — PANTOPRAZOLE SODIUM 40 MG PO TBEC
40.0000 mg | DELAYED_RELEASE_TABLET | Freq: Every day | ORAL | Status: DC
  Filled 2016-04-30 (×2): qty 1

## 2016-04-30 MED ORDER — ONDANSETRON HCL 4 MG PO TABS
4.0000 mg | ORAL_TABLET | Freq: Four times a day (QID) | ORAL | Status: DC | PRN
Start: 2016-04-30 — End: 2016-05-03
  Administered 2016-04-30 – 2016-05-01 (×2): 4 mg via ORAL
  Filled 2016-04-30 (×2): qty 1

## 2016-04-30 MED ORDER — ENOXAPARIN SODIUM 40 MG/0.4ML ~~LOC~~ SOLN: 40.0000 mg | SUBCUTANEOUS | Status: DC

## 2016-04-30 MED ORDER — RESOURCE THICKENUP CLEAR PO POWD
ORAL | Status: DC | PRN
  Filled 2016-04-30: qty 125

## 2016-04-30 MED ORDER — ACETAMINOPHEN 325 MG PO TABS
650.0000 mg | ORAL_TABLET | ORAL | Status: DC | PRN
  Administered 2016-04-30 – 2016-05-13 (×19): 650 mg via ORAL
  Filled 2016-04-30 (×18): qty 2

## 2016-04-30 MED ORDER — OFLOXACIN 0.3 % OP SOLN
1.0000 [drp] | Freq: Four times a day (QID) | OPHTHALMIC | Status: DC
  Administered 2016-04-30 – 2016-05-13 (×48): 1 [drp] via OPHTHALMIC
  Filled 2016-04-30: qty 5

## 2016-04-30 MED ORDER — ACETAMINOPHEN 650 MG RE SUPP: 650.0000 mg | RECTAL | Status: DC | PRN

## 2016-04-30 MED ORDER — ALPRAZOLAM 0.5 MG PO TABS
0.5000 mg | ORAL_TABLET | Freq: Two times a day (BID) | ORAL | Status: DC
  Administered 2016-04-30 – 2016-05-13 (×26): 0.5 mg via ORAL
  Filled 2016-04-30 (×26): qty 1

## 2016-04-30 MED ORDER — SORBITOL 70 % SOLN
30.0000 mL | Freq: Every day | Status: DC | PRN
  Administered 2016-05-04: 30 mL via ORAL
  Filled 2016-04-30: qty 30

## 2016-04-30 MED ORDER — ALPRAZOLAM 0.5 MG PO TABS: 0.5000 mg | ORAL_TABLET | Freq: Two times a day (BID) | ORAL | 0 refills | Status: DC

## 2016-04-30 MED ORDER — SOLIFENACIN SUCCINATE 10 MG PO TABS: ORAL_TABLET | ORAL | 0 refills | Status: AC

## 2016-04-30 MED ORDER — PERPHENAZINE 2 MG PO TABS
2.0000 mg | ORAL_TABLET | Freq: Every day | ORAL | Status: DC
  Administered 2016-04-30 – 2016-05-12 (×13): 2 mg via ORAL
  Filled 2016-04-30 (×13): qty 1

## 2016-04-30 MED ORDER — TRIAZOLAM 0.25 MG PO TABS: ORAL_TABLET | ORAL | 0 refills | Status: DC

## 2016-04-30 MED ORDER — CLOPIDOGREL BISULFATE 75 MG PO TABS
75.0000 mg | ORAL_TABLET | Freq: Every day | ORAL | Status: DC
  Administered 2016-05-01 – 2016-05-13 (×13): 75 mg via ORAL
  Filled 2016-04-30 (×14): qty 1

## 2016-04-30 NOTE — Plan of Care (Signed)
Problem: Education: Goal: Knowledge of disease or condition will improve Outcome: Progressing Pt able to discuss complications of stroke and immobility and ways to prevent complications

## 2016-04-30 NOTE — Progress Notes (Signed)
Patient for transfer to Inpatient Rehab. Report given to [facility] using SBAR. All questions were answered.

## 2016-04-30 NOTE — Progress Notes (Signed)
Inpatient Rehabilitation  Met with patient to discuss team's recommendation for IP Rehab.  Shared booklets and answered questions.  Patient hesitant about 3 hours of therapy a day, but I explained that it could be spread out.  Patient also reported that her son was out looking at SNFs today.  I have called him and left a message to also discuss IP Rehab.  I await a call back.  Please call with questions.   Carmelia Roller., CCC/SLP Admission Coordinator  [place]  Cell [phone]

## 2016-04-30 NOTE — Progress Notes (Signed)
Speech Language Pathology Treatment: Dysphagia  Patient Details Name: Sarah [name] MRN: [id number] DOB: 25-Dec-1926 Today's Date: 04/30/2016 Time: [id number] SLP Time Calculation (min) (ACUTE ONLY): 31 min  Assessment / Plan / Recommendation Clinical Impression  Pt's caregiver reports that the pt has been drinking thin water all day without coughing. SLP provided advanced trials of thin liquids and Dys 2 textures with no overt signs of aspiration observed. She still has left-sided pocketing, but it is reduced from previous date. Pt also seems to have some improving awareness and makes some attempts to self-monitor. Recommend to advance diet to Dys 2 textures and thin liquids, but would keep full supervision for safety.    HPI HPI: Pt is an 83 y.o. female who presented to the ED with acute slurred speech and drooling while at her PCP visit. Pt has a PMH significant for Parkinson's, stroke with residual weakness, depression, and anxiety. CT negative for acute abnormality. MRI revealing subcentimeter acute/early subacute infarct within L frontal centrum semiovale.      SLP Plan  Continue with current plan of care     Recommendations  Diet recommendations: Dysphagia 2 (fine chop);Thin liquid Liquids provided via: Cup;Straw Medication Administration: Whole meds with puree Supervision: Staff to assist with self feeding;Full supervision/cueing for compensatory strategies Compensations: Slow rate;Small sips/bites;Follow solids with liquid Postural Changes and/or Swallow Maneuvers: Seated upright 90 degrees                Oral Care Recommendations: Oral care BID Follow up Recommendations: Inpatient Rehab Plan: Continue with current plan of care       GO                Germain Osgood 04/30/2016, 3:52 PM  Germain Osgood, M.A. CCC-SLP [phone]

## 2016-04-30 NOTE — Progress Notes (Signed)
Inpatient Rehabilitation  I have spoken with the patient, her son, and her daughter and all are in agreement with the plan to proceed with an IP Rehab admission to maximize her functional independence and maximize her swallow safety.  I have received medical clearance as well and have a bed to offer today.  Will proceed with IP Rehab admission today.  Please call with questions.   Carmelia Roller., CCC/SLP Admission Coordinator  [place]  Cell [phone]

## 2016-04-30 NOTE — Discharge Summary (Signed)
Physician Discharge Summary  Sarah [name] [id number] DOB: 09/12/1926 DOA: 04/27/2016  PCP: Hoyt Koch, MD  Admit date: 04/27/2016 Discharge date: 04/30/2016  Admitted From: Home  Disposition:  Inpatient rehab.   Recommendations for Outpatient Follow-up:  1. Follow up with PCP in 1- weeks 2. Patient placed on full dose aspirin and clopidogrel for 3 months then should continue only clopidogrel.    Home Health: NA Equipment/Devices: NA  Discharge Condition: Stable  CODE STATUS: full  Diet recommendation: Heart healthy Diet recommendations: Dysphagia 1 (puree);Nectar-thick liquid Liquids provided via: Cup;Straw Medication Administration: Whole meds with puree Supervision: Staff to assist with self feeding;Full supervision/cueing for compensatory strategies Compensations: Slow rate;Small sips/bites;Follow solids with liquid Postural Changes and/or Swallow Maneuvers: Seated upright 90 degrees  Brief/Interim Summary: This is a 83 year old female who presents to the hospital with a chief complaint of slurred speech and drooling. She does not have Parkinson's disease, depression, anxiety. She does have chronic procedure left-sided weakness. She had an acute worsening of her symptoms. On initial physical examination blood pressure 162/60, heart rate 95, respiratory 19, oxygen saturation 99%. She was awake and alert, she had left facial weakness, slight dysarthria, coarse resting tremor involving the bilateral upper extremities. Her lungs were clear to auscultation, heart S1 S2 present rhythmic, the abdomen was soft. Na 132, potassium 4.1, chloride 97, bicarbonate 27, glucose 115, BUN 15, creatinine 0.87, white count 5.9, hemoglobin 12.4, hematocrit 38.8, platelets 297.  CVA head noncontrast negative for acute stroke, urinalysis with 6-30 white cells, moderate leukocytes. EKG was normal sinus rhythm.  The patient was admitted to the hospital working diagnosis of acute dysarthria, rule  out CVA.  1. Small left frontal CR infarct secondary to small vessel disease. Patient was admitted to the medical floor with telemetry monitor. Further studies included an MRI showed a subcentimeter left frontal CR infarct, MRA with left PCA stenosis., Carotid Doppler with no significant stenosis, echocardiography ejection fraction 55-60%. S[eech evaluation recommended nectar thick liquids. Neurology was consulted, recommendations for dual antiplatelet therapy with aspirin and Plavix for 3 months then continued only Plavix. Inpatient evaluation for inpatient rehab. Continue statin therapy.  2. Hypertension. Blood pressure remained well-controlled, currently off antihypertensive agents. Systolic [id number].  3. Anxiety and depression. Patient will continue alprazolam and nortriptyline as an outpatient.  4. Hyponatremia. Sodium remained stable at 132.  5. Parkinsonism. Patient was continued on Sinemet.  6. Dyslipidemia. Continue statin therapy.   Discharge Diagnoses:  Principal Problem:   CVA (cerebral vascular accident) [facility]) Active Problems:   Anxiety   Hyponatremia   Primary Parkinsonism ([place])   Hypertension   Dysarthria   History of stroke   Acute focal neurological deficit   Acute CVA (cerebrovascular accident) ([place])   Dysphagia, post-stroke   History of cerebrovascular accident (CVA) with residual deficit   Benign essential HTN    Discharge Instructions  Discharge Instructions    Ambulatory referral to Physical Medicine Rehab    Complete by:  As directed    Stroke follow up     Allergies as of 04/30/2016      Reactions   Lipitor [atorvastatin Calcium] Other (See Comments)   Leg weakness,memory loss   Lovastatin Other (See Comments)   Leg weakness   Celebrex [celecoxib] Itching, Rash   Latex Rash   Macrobid [nitrofurantoin Monohyd Macro] Other (See Comments)   Unknown per patient   Other Other (See Comments)   Always pads      Medication List    STOP taking  these medications   furosemide 20 MG tablet Commonly known as:  LASIX   ibuprofen 200 MG tablet Commonly known as:  ADVIL,MOTRIN     TAKE these medications   acetaminophen 500 MG tablet Commonly known as:  TYLENOL Take 1 tablet (500 mg total) by mouth every 8 (eight) hours as needed (for pain).   ALPRAZolam 0.5 MG tablet Commonly known as:  XANAX Take 1 tablet (0.5 mg total) by mouth 2 (two) times daily. What changed:  medication strength  how much to take   ARNICARE [place] EX Apply 1 application topically daily as needed (pain).   aspirin 325 MG EC tablet Take 1 tablet (325 mg total) by mouth daily. Start taking on:  05/01/2016   carbidopa-levodopa 25-100 MG tablet Commonly known as:  SINEMET IR TAKE 1 TABLET 3 TIMES A DAY.   clopidogrel 75 MG tablet Commonly known as:  PLAVIX Take 1 tablet (75 mg total) by mouth daily.   diclofenac sodium 1 % Gel Commonly known as:  VOLTAREN Apply  To feet 3 times daily prn for pain   Glucosamine HCl 750 MG Tabs Take 1 tablet by mouth 2 (two) times daily.   nitroGLYCERIN 0.4 MG SL tablet Commonly known as:  NITROSTAT Place 1 tablet (0.4 mg total) under the tongue every 5 (five) minutes as needed for chest pain.   nortriptyline 25 MG capsule Commonly known as:  PAMELOR TAKE 1 CAPSULE AT BEDTIME.   ofloxacin 0.3 % ophthalmic solution Commonly known as:  OCUFLOX Place 1 drop into the right eye 4 (four) times daily.   pantoprazole 40 MG tablet Commonly known as:  PROTONIX TAKE 1 TABLET ONCE DAILY.   perphenazine 2 MG tablet Commonly known as:  TRILAFON Take 1 tablet (2 mg total) by mouth at bedtime.   pravastatin 20 MG tablet Commonly known as:  PRAVACHOL TAKE (1/2) TABLET DAILY.   prednisoLONE acetate 1 % ophthalmic suspension Commonly known as:  PRED FORTE Place 1 drop into the right eye 2 (two) times daily. What changed:  when to take this   solifenacin 10 MG tablet Commonly known as:  VESICARE TAKE (1) TABLET  DAILY AS NEEDED FOR BLADDER What changed:  See the new instructions.   triazolam 0.25 MG tablet Commonly known as:  HALCION TAKE 0.125mg  AT BEDTIME FOR SLEEP. What changed:  See the new instructions.   VITAMIN D PO Take 1 tablet by mouth daily.      Follow-up Information    PATEL, DONIKA, DO. Go on 06/23/2016.   Specialty:  Neurology Contact information: [address] [place] [id number] [phone]          Allergies  Allergen Reactions  . Lipitor [Atorvastatin Calcium] Other (See Comments)    Leg weakness,memory loss  . Lovastatin Other (See Comments)    Leg weakness  . Celebrex [Celecoxib] Itching and Rash  . Latex Rash  . Macrobid [employer] Macro] Other (See Comments)    Unknown per patient  . Other Other (See Comments)    Always pads    Consultations:  Neurology    Procedures/Studies: Mr Brain 63 Contrast  Result Date: 04/27/2016 CLINICAL DATA:  83 y/o  F; acute slurred speech and drooling. EXAM: MRI HEAD WITHOUT CONTRAST MRA HEAD WITHOUT CONTRAST TECHNIQUE: Multiplanar, multiecho pulse sequences of the brain and surrounding structures were obtained without intravenous contrast. Angiographic images of the head were obtained using MRA technique without contrast. COMPARISON:  04/27/2016 CT head. MRI head and  MRA head dated 07/03/2014. FINDINGS: MRI HEAD FINDINGS Brain: Left frontal centrum semiovale 5 mm focus of diffusion restriction. Stable T2 FLAIR hyperintense signal abnormality in white matter is nonspecific but consistent with advanced chronic microvascular ischemic change. Moderate brain parenchymal volume loss. No abnormal susceptibility hypointensity to indicate acute intracranial hemorrhage. No focal mass effect, hydrocephalus, or extra-axial collection. Vascular: As below. Skull and upper cervical spine: Normal marrow signal. Sinuses/Orbits: Negative. Other: None. MRA HEAD FINDINGS Internal carotid arteries: Patent. Mild  bilateral ICA irregularity in the cavernous segments without significant stenosis. Anterior cerebral arteries:  Patent. Middle cerebral arteries: Patent. Anterior communicating artery: Patent. Posterior communicating arteries: Patent. Left P1 and P2 short segments of moderate stenosis. Posterior cerebral arteries:  Patent. Basilar artery:  Patent. Vertebral arteries:  Patent. No large vessel occlusion or aneurysm. IMPRESSION: 1. Subcentimeter acute/early subacute infarct within the left frontal centrum semiovale. No acute hemorrhage. 2. Stable advanced chronic microvascular ischemic changes and moderate volume loss of the brain. 3. No evidence of large vessel occlusion, or aneurysm of the circle of Willis. Intracranial atherosclerosis with short segments of stenosis most pronounced in the left posterior cerebral artery. These results will be called to the ordering clinician or representative by the Radiologist Assistant, and communication documented in the PACS or zVision Dashboard. Electronically Signed   By: Kristine Garbe M.D.   On: 04/27/2016 23:05   Mr Jodene Nam Head/brain [id number] Cm  Result Date: 04/27/2016 CLINICAL DATA:  83 y/o  F; acute slurred speech and drooling. EXAM: MRI HEAD WITHOUT CONTRAST MRA HEAD WITHOUT CONTRAST TECHNIQUE: Multiplanar, multiecho pulse sequences of the brain and surrounding structures were obtained without intravenous contrast. Angiographic images of the head were obtained using MRA technique without contrast. COMPARISON:  04/27/2016 CT head. MRI head and MRA head dated 07/03/2014. FINDINGS: MRI HEAD FINDINGS Brain: Left frontal centrum semiovale 5 mm focus of diffusion restriction. Stable T2 FLAIR hyperintense signal abnormality in white matter is nonspecific but consistent with advanced chronic microvascular ischemic change. Moderate brain parenchymal volume loss. No abnormal susceptibility hypointensity to indicate acute intracranial hemorrhage. No focal mass effect,  hydrocephalus, or extra-axial collection. Vascular: As below. Skull and upper cervical spine: Normal marrow signal. Sinuses/Orbits: Negative. Other: None. MRA HEAD FINDINGS Internal carotid arteries: Patent. Mild bilateral ICA irregularity in the cavernous segments without significant stenosis. Anterior cerebral arteries:  Patent. Middle cerebral arteries: Patent. Anterior communicating artery: Patent. Posterior communicating arteries: Patent. Left P1 and P2 short segments of moderate stenosis. Posterior cerebral arteries:  Patent. Basilar artery:  Patent. Vertebral arteries:  Patent. No large vessel occlusion or aneurysm. IMPRESSION: 1. Subcentimeter acute/early subacute infarct within the left frontal centrum semiovale. No acute hemorrhage. 2. Stable advanced chronic microvascular ischemic changes and moderate volume loss of the brain. 3. No evidence of large vessel occlusion, or aneurysm of the circle of Willis. Intracranial atherosclerosis with short segments of stenosis most pronounced in the left posterior cerebral artery. These results will be called to the ordering clinician or representative by the Radiologist Assistant, and communication documented in the PACS or zVision Dashboard. Electronically Signed   By: Kristine Garbe M.D.   On: 04/27/2016 23:05   Ct Head Code Stroke W/o Cm  Result Date: 04/27/2016 CLINICAL DATA:  Code stroke. Slurred speech. Intermittent ruling. Right arm weakness. EXAM: CT HEAD WITHOUT CONTRAST TECHNIQUE: Contiguous axial images were obtained from the base of the skull through the vertex without intravenous contrast. COMPARISON:  08/29/2014 FINDINGS: Brain: Generalized brain atrophy. Confluent chronic small vessel ischemic changes affecting  the cerebral hemispheric white matter. No evidence of cortical infarction. No mass lesion, hemorrhage, hydrocephalus or extra-axial collection. Calcified meningioma of the sphenoid wing on the right as seen previously. Vascular:  There is atherosclerotic calcification of the major vessels at the base of the brain. No acute hyperdense vessel identified. Skull: Negative Sinuses/Orbits: Clear/normal Other: None significant ASPECTS ([place] Stroke Program Early CT Score) - Ganglionic level infarction (caudate, lentiform nuclei, internal capsule, insula, M1-M3 cortex): 7 - Supraganglionic infarction (M4-M6 cortex): 3 Total score (0-10 with 10 being normal): 10 IMPRESSION: 1. No acute finding by CT. Widespread chronic small-vessel ischemic changes. Right sphenoid wing meningioma as seen previously. 2. ASPECTS is 10 These results were called by telephone at the time of interpretation on 04/27/2016 at 4:36 pm to Dr. Leonel Ramsay, who verbally acknowledged these results. Electronically Signed   By: Nelson Chimes M.D.   On: 04/27/2016 16:37    (Echo, Carotid, EGD, Colonoscopy, ERCP)    Subjective: Patient feeling better, no nausea or vomiting, no diarrhea. No chest pain or dyspnea  Discharge Exam: Vitals:   04/30/16 0456 04/30/16 1035  BP: (!) 118/54 (!) 141/51  Pulse: 85 85  Resp: 18 19  Temp: 98.2 F (36.8 C) 98.2 F (36.8 C)   Vitals:   04/29/16 2055 04/30/16 0052 04/30/16 0456 04/30/16 1035  BP: (!) 147/62 (!) 125/49 (!) 118/54 (!) 141/51  Pulse: 85 85 85 85  Resp: 18 18 18 19   Temp: 98.6 F (37 C) 98.3 F (36.8 C) 98.2 F (36.8 C) 98.2 F (36.8 C)  TempSrc: Oral Oral Oral Oral  SpO2: 98% 95% 92% 93%  Weight:      Height:        General: Pt is alert, awake, not in acute distress Cardiovascular: RRR, S1/S2 +, no rubs, no gallops Respiratory: CTA bilaterally, no wheezing, no rhonchi Abdominal: Soft, NT, ND, bowel sounds + Extremities: no edema, no cyanosis    The results of significant diagnostics from this hospitalization (including imaging, microbiology, ancillary and laboratory) are listed below for reference.     Microbiology: No results found for this or any previous visit (from the past 240 hour(s)).    Labs: BNP (last 3 results) No results for input(s): BNP in the last 8760 hours. Basic Metabolic Panel:  Recent Labs Lab 04/27/16 1631 04/27/16 1638  NA 132* 132*  K 4.1 4.1  CL 97* 94*  CO2 27  --   GLUCOSE 115* 112*  BUN 15 17  CREATININE 0.87 0.80  CALCIUM 9.2  --    Liver Function Tests:  Recent Labs Lab 04/27/16 1631  AST 11*  ALT 9*  ALKPHOS 46  BILITOT 0.3  PROT 6.4*  ALBUMIN 3.5   No results for input(s): LIPASE, AMYLASE in the last 168 hours. No results for input(s): AMMONIA in the last 168 hours. CBC:  Recent Labs Lab 04/27/16 1631 04/27/16 1638  WBC 5.9  --   NEUTROABS 3.4  --   HGB 12.4 13.6  HCT 38.8 40.0  MCV 93.3  --   PLT 297  --    Cardiac Enzymes: No results for input(s): CKTOTAL, CKMB, CKMBINDEX, TROPONINI in the last 168 hours. BNP: Invalid input(s): POCBNP CBG: No results for input(s): GLUCAP in the last 168 hours. D-Dimer No results for input(s): DDIMER in the last 72 hours. Hgb A1c  Recent Labs  04/28/16 0725  HGBA1C 5.7*   Lipid Profile  Recent Labs  04/28/16 0725  CHOL 139  HDL 70  LDLCALC  57  TRIG 61  CHOLHDL 2.0   Thyroid function studies No results for input(s): TSH, T4TOTAL, T3FREE, THYROIDAB in the last 72 hours.  Invalid input(s): FREET3 Anemia work up No results for input(s): VITAMINB12, FOLATE, FERRITIN, TIBC, IRON, RETICCTPCT in the last 72 hours. Urinalysis    Component Value Date/Time   COLORURINE YELLOW 04/27/2016 2020   APPEARANCEUR HAZY (A) 04/27/2016 2020   LABSPEC 1.008 04/27/2016 2020   PHURINE 7.0 04/27/2016 2020   GLUCOSEU NEGATIVE 04/27/2016 2020   HGBUR NEGATIVE 04/27/2016 2020   BILIRUBINUR NEGATIVE 04/27/2016 2020   KETONESUR NEGATIVE 04/27/2016 2020   PROTEINUR NEGATIVE 04/27/2016 2020   UROBILINOGEN 0.2 07/04/2014 0945   NITRITE NEGATIVE 04/27/2016 2020   LEUKOCYTESUR MODERATE (A) 04/27/2016 2020   Sepsis Labs Invalid input(s): PROCALCITONIN,  WBC,   LACTICIDVEN Microbiology No results found for this or any previous visit (from the past 240 hour(s)).   Time coordinating discharge: 45 minutes  SIGNED:   Tawni Millers, MD  [facility] 04/30/2016, 1:57 PM Pager   If 7PM-7AM, please contact night-coverage [email or web address] Password [email or web address]

## 2016-04-30 NOTE — PMR Pre-admission (Signed)
PMR Admission Coordinator Pre-Admission Assessment  Patient: Sarah [name] is an 83 y.o., female MRN: [id number] DOB: 10-19-1926 Height: 5' (152.4 cm) Weight: 75.5 kg (166 lb 6.4 oz)              Insurance Information HMO:     PPO:      PCP:      IPA:      80/20:      OTHER:  PRIMARY: Medicare A & B       Policy#: [id number]      Subscriber: Self CM Name:       Phone#:      Fax#:  Pre-Cert#: Eligible via [employer]       Employer: Retired  Benefits:  Phone #:      Name:  Eff. Date: 08/27/1993     Deduct: $1340      Out of Pocket Max: None      Life Max: None CIR: 100%      SNF: 100% days 1-20; 80% days 21-100 Outpatient: 80%     Co-Pay: 20% Home Health: 100%      Co-Pay: none DME: 80%     Co-Pay: 20% Providers: patient's choice   SECONDARY: BCBS Supplement       Policy#: [id number]      Subscriber: Self CM Name:       Phone#:      Fax#:  Pre-Cert#:       Employer:  Benefits:  Phone #: [phone]     Name:  Eff. Date:      Deduct:       Out of Pocket Max:       Life Max:  CIR:       SNF:  Outpatient:      Co-Pay:  Home Health:       Co-Pay:  DME:      Co-Pay:   Medicaid Application Date:       Case Manager:  Disability Application Date:       Case Worker:   Emergency Contact Information Contact Information    Name Relation Home Work Mobile   Sarah [name] [place] Son [phone]  [phone]   Sarah [name]   [phone]   Sarah [name]    [phone]     Current Medical History  Patient Admitting Diagnosis:  Acute early subacute infarct left frontal centrum semi-ovale.  History of Present Illness: Sarah D Upchurchis a 83 y.o.right handed femalewith history of Parkinson's disease maintained on Sinemet, CVA with residual left-sided weakness maintained on Plavix. Patient lives alone, used an assistive device prior to admission, and she has a home health aide 10-6, 7 days a week. She needed assistance with some dressing and bathing. She does not donn/doff her  socks or shoes independently or drive or get her groceries. She was receiving HH therapies after her CVA in 2016. Presented 04/27/2016 with slurred speech. She was hypertensive 190/70. Mild hyponatremia 132. Troponin negative. Cranial CT scan reviewed, unremarkable for acute process. She did not receive TPA. MRI showed acute early subacute infarct within the left frontal centrum semiovale. No acute hemorrhage. MRA with no evidence of large vessel occlusion or aneurysm. Carotid Dopplers with no ICA stenosis. Echocardiogram with ejection fraction 123456 grade 1 diastolic dysfunction. Neurology consulted presently maintained on aspirin and Plavix for CVA prophylaxis 3 months then Plavix alone. Subcutaneous Lovenox for DVT prophylaxis. Currently on a dysphagia 1 nectar-thick liquid diet. Physical therapy evaluation completed with recommendations of physical medicine rehabilitation consult. Patient was admitted  for a comprehensive rehabilitation program.   NIH Total: 6    Past Medical History  Past Medical History:  Diagnosis Date  . Bladder incontinence   . Chest pain    S/P nuclear 07/17/2008  normal; EF 88% with no ischemia  . Chronic venous insufficiency    BLE edema, chronic, neg venous US 2/13  . Constipation    hx of  . Depression   . Diverticulitis   . Esophageal stricture    s/p dilation 04/2012  . GERD (gastroesophageal reflux disease)   . History of chicken pox   . Hyperlipidemia   . Hypertension   . Osteoarthritis   . Osteoporosis, postmenopausal 06/02/2011  . Polyp of colon   . Stroke [facility]) 07/03/2014    Family History  family history includes Breast cancer in her sister; Cancer in her son; Skin cancer in her sister.  Prior Rehab/Hospitalizations:  Has the patient had major surgery during 100 days prior to admission? Yes, cataract surgery on her right eye in January.  Current Medications   Current Facility-Administered Medications:  .  acetaminophen (TYLENOL) tablet 650 mg, 650  mg, Oral, Q4H PRN, 650 mg at 04/30/16 0432 **OR** acetaminophen (TYLENOL) solution 650 mg, 650 mg, Per Tube, Q4H PRN **OR** acetaminophen (TYLENOL) suppository 650 mg, 650 mg, Rectal, Q4H PRN, Vianne Bulls, MD .  ALPRAZolam Duanne Moron) tablet 0.5 mg, 0.5 mg, Oral, BID, Jessica U Vann, DO, 0.5 mg at 04/30/16 0940 .  aspirin EC tablet 325 mg, 325 mg, Oral, Daily, Rosalin Hawking, MD, 325 mg at 04/30/16 0940 .  carbidopa-levodopa (SINEMET IR) 25-100 MG per tablet immediate release 1 tablet, 1 tablet, Oral, TID, Vianne Bulls, MD, 1 tablet at 04/30/16 0940 .  clopidogrel (PLAVIX) tablet 75 mg, 75 mg, Oral, Daily, Ilene Qua Opyd, MD, 75 mg at 04/30/16 0940 .  enoxaparin (LOVENOX) injection 40 mg, 40 mg, Subcutaneous, Q24H, Ilene Qua Opyd, MD, 40 mg at 04/29/16 2129 .  hydrOXYzine (ATARAX/VISTARIL) tablet 10 mg, 10 mg, Oral, TID PRN, Geradine Girt, DO .  labetalol (NORMODYNE,TRANDATE) injection 5 mg, 5 mg, Intravenous, Q2H PRN, Ilene Qua Opyd, MD .  LORazepam (ATIVAN) injection 1 mg, 1 mg, Intravenous, Q4H PRN, Ilene Qua Opyd, MD .  MEDLINE mouth rinse, 15 mL, Mouth Rinse, BID, Jessica U Vann, DO, 15 mL at 04/30/16 0941 .  morphine 2 MG/ML injection 1 mg, 1 mg, Intravenous, Once, Gardiner Barefoot, NP .  nortriptyline (PAMELOR) capsule 25 mg, 25 mg, Oral, QHS, Ilene Qua Opyd, MD, 25 mg at 04/29/16 2130 .  ofloxacin (OCUFLOX) 0.3 % ophthalmic solution 1 drop, 1 drop, Right Eye, QID, Vianne Bulls, MD, 1 drop at 04/30/16 0941 .  pantoprazole (PROTONIX) EC tablet 40 mg, 40 mg, Oral, Daily, Ilene Qua Opyd, MD, 40 mg at 04/30/16 0940 .  perphenazine (TRILAFON) tablet 2 mg, 2 mg, Oral, QHS, Ilene Qua Opyd, MD, 2 mg at 04/29/16 2130 .  pravastatin (PRAVACHOL) tablet 10 mg, 10 mg, Oral, q1800, Ilene Qua Opyd, MD, 10 mg at 04/29/16 1752 .  prednisoLONE acetate (PRED FORTE) 1 % ophthalmic suspension 1 drop, 1 drop, Right Eye, BID, Mauricio Gerome Apley, MD .  RESOURCE THICKENUP CLEAR, , Oral, PRN, Geradine Girt,  DO .  senna-docusate (Senokot-S) tablet 1 tablet, 1 tablet, Oral, QHS PRN, Ilene Qua Opyd, MD .  triazolam (HALCION) tablet 0.125 mg, 0.125 mg, Oral, QHS PRN, Vianne Bulls, MD  Patients Current Diet: DIET - DYS 1 Room service appropriate?  Yes; Fluid consistency: Nectar Thick Diet - low sodium heart healthy  Precautions / Restrictions Precautions Precautions: Fall Restrictions Weight Bearing Restrictions: No   Has the patient had 2 or more falls or a fall with injury in the past year?No, 1 fall without injury   Prior Activity Level Limited Community (1-2x/wk): Prior to admission patient had a hired caregiver, [employer] who was with her from about 10-6 daily.  She went out about twice a week for medical appointments, hair, nails, and errands.    Home Assistive Devices / Equipment Home Assistive Devices/Equipment: Gilford Rile (specify type) Home Equipment: Walker - 4 wheels, Shower seat  Prior Device Use: Indicate devices/aids used by the patient prior to current illness, exacerbation or injury? Walker  Prior Functional Level Prior Function Level of Independence: Needs assistance Gait / Transfers Assistance Needed: Used rollator ADL's / Homemaking Assistance Needed: Needs assistance with dressing/bathing. Does not don/doff socks and shoes independently.  Self Care: Did the patient need help bathing, dressing, using the toilet or eating? Needed some help  Indoor Mobility: Did the patient need assistance with walking from room to room (with or without device)? Independent  Stairs: Did the patient need assistance with internal or external stairs (with or without device)? Needed some help  Functional Cognition: Did the patient need help planning regular tasks such as shopping or remembering to take medications? Needed some help  Current Functional Level Cognition  Arousal/Alertness: Awake/alert Overall Cognitive Status: Impaired/Different from baseline Current Attention Level:  Sustained Orientation Level: Oriented to person, Oriented to place, Oriented to situation, Disoriented to time Following Commands: Follows one step commands with increased time General Comments: Slow processing with ADL tasks but alert and oriented.  Attention: Sustained Sustained Attention: Impaired Sustained Attention Impairment: Verbal basic Memory: Impaired Memory Impairment: Decreased recall of new information Awareness: Impaired Awareness Impairment: Anticipatory impairment Problem Solving: Impaired Problem Solving Impairment: Functional basic Safety/Judgment: Impaired    Extremity Assessment (includes Sensation/Coordination)  Upper Extremity Assessment: Defer to OT evaluation RUE Deficits / Details: Lag with coordination testing. LUE Deficits / Details: Weakness at baseline due to previous CVA. Intention tremor with coordination testing at end of session. Slight dysmetria with finger to nose testing.  Lower Extremity Assessment: Overall WFL for tasks assessed    ADLs  Overall ADL's : Needs assistance/impaired Eating/Feeding: NPO Grooming: Minimal assistance, Sitting Upper Body Bathing: Minimal assistance, Sitting Lower Body Bathing: Maximal assistance, Sit to/from stand Upper Body Dressing : Minimal assistance, Sitting Lower Body Dressing: Maximal assistance, Sit to/from stand Toilet Transfer Details (indicate cue type and reason): Pt only able to tolerate sit<>stand with max assist this session due to reports of feeling weak.  General ADL Comments: Pt reporting feeling weak due to not eating. Significantly decreased activity tolerance. Although she requires assistance with ADL at baseline, she demonstrates functional decline with ADL at current level.    Mobility  Overal bed mobility: Needs Assistance Bed Mobility: Supine to Sit Supine to sit: Mod assist Sit to supine: Mod assist General bed mobility comments: assist to lift trunk and scoot to EOB; increased time to  initiate and bring legs off bed    Transfers  Overall transfer level: Needs assistance Equipment used: 4-wheeled walker Transfers: Sit to/from Stand Sit to Stand: Min assist General transfer comment: increased time to rise, assist for balance and cues and occasional assist for UE placement (R reaching below handle on walker)    Ambulation / Gait / Stairs / Wheelchair Mobility  Ambulation/Gait Ambulation/Gait assistance: Min assist  Ambulation Distance (Feet): 20 Feet (and 5') Assistive device: 4-wheeled walker Gait Pattern/deviations: Step-through pattern, Decreased stride length, Shuffle, Wide base of support General Gait Details: decreased safety with turning and assist for balance, safety, cues for walker positioning; remains flexed throughout, did note L LE buckling initially    Posture / Balance Dynamic Sitting Balance Sitting balance - Comments: reminders to keep from leaning to R Balance Overall balance assessment: Needs assistance Sitting-balance support: Feet supported Sitting balance-Leahy Scale: Fair Sitting balance - Comments: reminders to keep from leaning to R Standing balance support: Bilateral upper extremity supported, During functional activity Standing balance-Leahy Scale: Poor Standing balance comment: flexed posture; able to straighten up slightly with cues    Special needs/care consideration BiPAP/CPAP: No CPM: No Continuous Drip IV: No Dialysis: No         Life Vest: No Oxygen: No Special Bed: No Trach Size: No Wound Vac (area): No       Skin: WDL                                Bowel mgmt: 04/29/16 Bladder mgmt: Continent with some leakage, same as PTA Diabetic mgmt: No,HgbA1c 5.7      Previous Home Environment Living Arrangements: Alone  Lives With: Alone Available Help at Discharge: Home health Type of Home: House Home Layout: Two level, Able to live on main level with bedroom/bathroom Alternate Level Stairs-Number of Steps: stays on main Home  Access: Stairs to enter Entrance Stairs-Rails: None Entrance Stairs-Number of Steps: 1 Bathroom Shower/Tub: Multimedia programmer: Standard Home Care Services: Yes Type of Home Care Services: Homehealth aide  Discharge Living Setting Plans for Discharge Living Setting: Patient's home, Other (Comment) (townhouse ) Type of Home at Discharge: Other (Comment) (townhouse ) Discharge Home Layout: Two level, Able to live on main level with bedroom/bathroom Alternate Level Stairs-Rails:  (unknown did not go up any more PTA) Discharge Home Access: Stairs to enter Entrance Stairs-Rails: None Entrance Stairs-Number of Steps: 1 Discharge Bathroom Shower/Tub: Other (comment) (has both a tub & a shower but patient preferred to spongue ) Discharge Bathroom Toilet: Handicapped height Discharge Bathroom Accessibility: Yes How Accessible: Accessible via walker Does the patient have any problems obtaining your medications?: No  Social/Family/Support Systems Patient Roles: Parent Contact Information: Maneka Baltzell [phone]; Daughter [name] [phone] Anticipated Caregiver: [name] is her hired caregiver [phone] Anticipated Caregiver's Contact Information: see above  Ability/Limitations of Caregiver: [name] currently works from about 10-6 Caregiver Availability: Intermittent Discharge Plan Discussed with Primary Caregiver: Yes Is Caregiver In Agreement with Plan?: Yes Does Caregiver/Family have Issues with Lodging/Transportation while Pt is in Rehab?: No  Goals/Additional Needs Patient/Family Goal for Rehab: PT, OT, SLP Supervision  Expected length of stay: 12-15 days  Cultural Considerations: None Dietary Needs: Dys.1 textures and nectar-thick liquids  Equipment Needs: TBD Special Service Needs: None Additional Information: Son and daughter have looked at local SNFs and said they would also consider hiring additional help for their mom if needed.  So they know that she will  likely need 24/7 assist after CIR and are exploring their options.  Pt/Family Agrees to Admission and willing to participate: Yes Program Orientation Provided & Reviewed with Pt/Caregiver Including Roles  & Responsibilities: Yes Additional Information Needs: None Information Needs to be Provided By: N/A   Decrease burden of Care through IP rehab admission: Diet advancement  Possible need for SNF placement upon discharge: Potential  Patient Condition: This patient's medical and functional status has changed since the consult dated 04/28/16 in which the Rehabilitation Physician determined and documented that the patient was potentially appropriate for intensive rehabilitative care in an inpatient rehabilitation facility. Issues have been addressed and update has been discussed with Dr. Naaman Plummer and patient now appropriate for inpatient rehabilitation. Will admit to inpatient rehab today.   Preadmission Screen Completed By:  Gunnar Fusi, 04/30/2016 2:30 PM ______________________________________________________________________   Discussed status with Dr. Naaman Plummer on 04/30/16 at 1445 and received telephone approval for admission today.  Admission Coordinator:  Gunnar Fusi, time 1445/Date 04/30/16

## 2016-04-30 NOTE — Consult Note (Signed)
[facility] CM Primary Care Navigator  04/30/2016  Sarah [name] 03-01-1927 [id number]  Met with patient and family- son Claiborne Billings), daughter [name]) and daughter in-law Mickel Baas) at the bedside to identify possible discharge needs. Patient reports that primary care provider has directed for her evaluation due to slurred speech and drooling that had led to this admission.  Patient endorses Dr. Pricilla Holm with [facility]) as the primary care provider.    Patient shared using [facility] [place]  to obtain medications without difficulty.   Patient's caregiver [name]) manages her medications at home.   She verbalized that her caregiver provides transportation to her doctors' appointments.  Patient's private pay caregiver assists her daily from 10am to 6pm and serves as her primary caregiver at home.   Discharge plan is [facility] ([facility]) prior to going home with caregiver.  Patient and family voiced understanding to call primary care provider's office once she returns back home, for a post discharge follow-up appointment within a week or sooner if needs arise. Patient letter (with PCP's contact number) was provided as their reminder.  Patient and family denies any care management needs or concerns at this time. Has no issues on HF management as stated.  For additional questions please contact:  Edwena Felty A. Jovoni Borkenhagen, BSN, RN-BC [facility] PRIMARY CARE Navigator Cell: [phone]

## 2016-04-30 NOTE — H&P (Signed)
Physical Medicine and Rehabilitation Admission H&P       Chief Complaint  Patient presents with  . Code Stroke  : HPI: Sarah D Upchurchis a 83 y.o.right handed femalewith history of Parkinson's disease maintained on Sinemet, CVA with residual left-sided weakness maintained on Plavix. Per chart review and patient's son/daughter, and patient, patient lives alone. Used an assistive device prior to admission. She has a home health aide 10-6 7 days a week, recently changed to 10-4. She needed assistance with some dressing and bathing. She does not donn [name] her socks or shoes independently or drive or get her groceries.She was receiving HH therapies prior to admission.One level home no steps to entry. Presented 04/27/2016 with slurred speech. She was hypertensive 190/70. Mild hyponatremia 132. Troponin negative. Cranial CT scan reviewed, unremarkable for acute process. She did not receive TPA. MRI showed acute early subacute infarct within the left frontal centrum semiovale. No acute hemorrhage. MRA with no evidence of large vessel occlusion or aneurysm. Carotid Dopplers with no ICA stenosis. Echocardiogram With ejection fraction 123456 grade 1 diastolic dysfunction. Neurology consulted presently maintained on aspirin and Plavix for CVA prophylaxis 3 months then Plavix alone. Subcutaneous Lovenox for DVT prophylaxis. Currently on a dysphagia #1 nectar thick liquid diet. Physical therapy evaluation completed with recommendations of physical medicine rehabilitation consult.Patient was admitted for a comprehensive rehabilitation program  Review of Systems  Constitutional: Negative for chills and fever.  HENT: Negative for hearing loss and tinnitus.   Eyes: Negative for blurred vision and double vision.  Respiratory: Positive for shortness of breath. Negative for cough.   Cardiovascular: Positive for chest pain. Negative for leg swelling.  Gastrointestinal: Positive for constipation.       GERD    Genitourinary:       Bladder incontinence  Musculoskeletal: Positive for back pain and myalgias.  Skin: Negative for rash.  Neurological: Positive for dizziness, tremors and weakness. Negative for seizures.  Psychiatric/Behavioral: Positive for depression.       Anxiety  All other systems reviewed and are negative.      Past Medical History:  Diagnosis Date  . Bladder incontinence   . Chest pain    S/P nuclear 07/17/2008  normal; EF 88% with no ischemia  . Chronic venous insufficiency    BLE edema, chronic, neg venous US 2/13  . Constipation    hx of  . Depression   . Diverticulitis   . Esophageal stricture    s/p dilation 04/2012  . GERD (gastroesophageal reflux disease)   . History of chicken pox   . Hyperlipidemia   . Hypertension   . Osteoarthritis   . Osteoporosis, postmenopausal 06/02/2011  . Polyp of colon   . Stroke [facility]) 07/03/2014        Past Surgical History:  Procedure Laterality Date  . APPENDECTOMY     age 46  . COLONOSCOPY    . POLYPECTOMY    . TUBAL LIGATION    . UPPER GASTROINTESTINAL ENDOSCOPY           Family History  Problem Relation Age of Onset  . Cancer Son     testicular cancer  . Breast cancer Sister   . Skin cancer Sister   . Colon cancer Neg Hx   . Esophageal cancer Neg Hx   . Rectal cancer Neg Hx   . Stomach cancer Neg Hx    Social History:  reports that she has never smoked. She has never used smokeless tobacco. She reports  that she does not drink alcohol or use drugs. Allergies:       Allergies  Allergen Reactions  . Lipitor [Atorvastatin Calcium] Other (See Comments)    Leg weakness,memory loss  . Lovastatin Other (See Comments)    Leg weakness  . Celebrex [Celecoxib] Itching and Rash  . Latex Rash  . Macrobid [employer] Macro] Other (See Comments)    Unknown per patient  . Other Other (See Comments)    Always pads            Facility-Administered  Medications Prior to Admission  Medication Dose Route Frequency Provider Last Rate Last Dose  . cyanocobalamin ((VITAMIN B-12)) injection 1,000 mcg  1,000 mcg Intramuscular Q30 days Binnie Rail, MD   1,000 mcg at 06/27/15 1545         Medications Prior to Admission  Medication Sig Dispense Refill  . acetaminophen (TYLENOL) 500 MG tablet Take 1 tablet (500 mg total) by mouth every 8 (eight) hours as needed (for pain). 180 tablet 2  . ALPRAZolam (XANAX) 1 MG tablet Take 1 tablet (1 mg total) by mouth 2 (two) times daily. 60 tablet 1  . carbidopa-levodopa (SINEMET IR) 25-100 MG tablet TAKE 1 TABLET 3 TIMES A DAY. 90 tablet 3  . Cholecalciferol (VITAMIN D PO) Take 1 tablet by mouth daily.     . clopidogrel (PLAVIX) 75 MG tablet Take 1 tablet (75 mg total) by mouth daily. 90 tablet 2  . diclofenac sodium (VOLTAREN) 1 % GEL Apply  To feet 3 times daily prn for pain 1 Tube 5  . furosemide (LASIX) 20 MG tablet TAKE 1 TABLET DAILY AS NEEDED. (Patient taking differently: TAKE 1 TABLET DAILY AS NEEDED FOR EDEMA) 30 tablet 0  . Glucosamine HCl 750 MG TABS Take 1 tablet by mouth 2 (two) times daily.     Marland Kitchen HALCION 0.25 MG tablet TAKE 1/2 TABLET AT BEDTIME AS NEEDED FOR SLEEP. (Patient taking differently: TAKE 0.125mg  AT BEDTIME FOR SLEEP.) 45 tablet 0  . Homeopathic Products (ARNICARE ARNICA EX) Apply 1 application topically daily as needed (pain).    Marland Kitchen ibuprofen (ADVIL,MOTRIN) 200 MG tablet Take 200 mg by mouth every 6 (six) hours as needed for headache.    . nitroGLYCERIN (NITROSTAT) 0.4 MG SL tablet Place 1 tablet (0.4 mg total) under the tongue every 5 (five) minutes as needed for chest pain. 50 tablet prn  . nortriptyline (PAMELOR) 25 MG capsule TAKE 1 CAPSULE AT BEDTIME. 30 capsule 3  . ofloxacin (OCUFLOX) 0.3 % ophthalmic solution Place 1 drop into the right eye 4 (four) times daily.    . pantoprazole (PROTONIX) 40 MG tablet TAKE 1 TABLET ONCE DAILY. 30 tablet 11  . perphenazine  (TRILAFON) 2 MG tablet Take 1 tablet (2 mg total) by mouth at bedtime. 30 tablet 2  . pravastatin (PRAVACHOL) 20 MG tablet TAKE (1/2) TABLET DAILY. 45 tablet 3  . prednisoLONE acetate (PRED FORTE) 1 % ophthalmic suspension Place 1 drop into the right eye every 2 (two) hours while awake.    . VESICARE 10 MG tablet TAKE (1) TABLET DAILY AS NEEDED. (Patient taking differently: TAKE (1) TABLET DAILY AS NEEDED FOR BLADDER) 30 tablet 0    Home: Home Living Family/patient expects to be discharged to:: Private residence Living Arrangements: Alone Available Help at Discharge: Home health Type of Home: House Home Access: Stairs to enter [employer] of Steps: 1 Entrance Stairs-Rails: None Home Layout: Two level, Able to live on main level  with bedroom/bathroom Alternate Level Stairs-Number of Steps: stays on main Bathroom Shower/Tub: Multimedia programmer: Standard Home Equipment: Environmental consultant - 4 wheels, Shower seat  Lives With: Alone   Functional History: Prior Function Level of Independence: Needs assistance Gait / Transfers Assistance Needed: Used rollator ADL's / Homemaking Assistance Needed: Needs assistance with dressing/bathing. Does not don/doff socks and shoes independently.  Functional Status:  Mobility: Bed Mobility Overal bed mobility: Needs Assistance Bed Mobility: Supine to Sit Supine to sit: Mod assist Sit to supine: Mod assist General bed mobility comments: assist to lift trunk and scoot to EOB; increased time to initiate and bring legs off bed Transfers Overall transfer level: Needs assistance Equipment used: 4-wheeled walker Transfers: Sit to/from Stand Sit to Stand: Min assist General transfer comment: increased time to rise, assist for balance and cues and occasional assist for UE placement (R reaching below handle on walker) Ambulation/Gait Ambulation/Gait assistance: Min assist Ambulation Distance (Feet): 20 Feet (and 5') Assistive device:  4-wheeled walker Gait Pattern/deviations: Step-through pattern, Decreased stride length, Shuffle, Wide base of support General Gait Details: decreased safety with turning and assist for balance, safety, cues for walker positioning; remains flexed throughout, did note L LE buckling initially  ADL: ADL Overall ADL's : Needs assistance/impaired Eating/Feeding: NPO Grooming: Minimal assistance, Sitting Upper Body Bathing: Minimal assistance, Sitting Lower Body Bathing: Maximal assistance, Sit to/from stand Upper Body Dressing : Minimal assistance, Sitting Lower Body Dressing: Maximal assistance, Sit to/from stand Toilet Transfer Details (indicate cue type and reason): Pt only able to tolerate sit<>stand with max assist this session due to reports of feeling weak.  General ADL Comments: Pt reporting feeling weak due to not eating. Significantly decreased activity tolerance. Although she requires assistance with ADL at baseline, she demonstrates functional decline with ADL at current level.  Cognition: Cognition Overall Cognitive Status: Impaired/Different from baseline Arousal/Alertness: Awake/alert Orientation Level: Oriented to person, Oriented to place, Oriented to situation, Disoriented to time Attention: Sustained Sustained Attention: Impaired Sustained Attention Impairment: Verbal basic Memory: Impaired Memory Impairment: Decreased recall of new information Awareness: Impaired Awareness Impairment: Anticipatory impairment Problem Solving: Impaired Problem Solving Impairment: Functional basic Safety/Judgment: Impaired Cognition Arousal/Alertness: Awake/alert Behavior During Therapy: WFL for tasks assessed/performed Overall Cognitive Status: Impaired/Different from baseline Area of Impairment: Following commands, Attention Current Attention Level: Sustained Following Commands: Follows one step commands with increased time Problem Solving: Slow processing, Decreased  initiation General Comments: Slow processing with ADL tasks but alert and oriented.   Physical Exam: Blood pressure (!) 118/54, pulse 85, temperature 98.2 F (36.8 C), temperature source Oral, resp. rate 18, height 5' (1.524 m), weight 75.5 kg (166 lb 6.4 oz), SpO2 92 %. Physical Exam  Constitutional: She appears well-developed.  HENT:  Head: Normocephalic.  Eyes: EOM are normal. Left eye exhibits no discharge.  Neck: Normal range of motion. Neck supple. No tracheal deviation present. No thyromegaly present.  Cardiovascular: Normal rate, regular rhythm and normal heart sounds.   Respiratory: No respiratory distress. She has no wheezes. She has no rales.  Decreased inspiratory effort but clear to auscultation  GI: Soft. Bowel sounds are normal. She exhibits no distension. There is no tenderness.  Skin: Skin is warm and dry.  Psychiatric: She has a normal mood and affect. Her behavior is normal.  Neurological: She is alertand oriented to person, place, and time. Masked facies Speech is dysarthric but intelligible.  She follows simple commands. Some delay in processing Mild left facial droop Motor: B/L UE 5/5 proximal to distal. No  rigidity RLE: 4/5 proximal to distal---some limitations due to pain LLE: 4-/5 proxima to distal. Some pain limitation.  Left sided motor apraxia Sensation grossly intact. DTR's 1+ bilaterally     Medical Problem List and Plan: 1.  Gait disorder/apraxia with dysphagia/dysarthria secondary to left frontal/ corona radiata infarct secondary to small vessel disease as well as history of CVA in the past with left side mild residual weakness             -admit to inpatient rehab 2.  DVT Prophylaxis/Anticoagulation: Subcutaneous Lovenox. Monitor platelet counts and any signs of bleeding 3. Pain Management/hx of chronic low back pain             - Tylenol as needed 4. Mood: Xanax 0.5 mg twice a day, Pamelor 25 mg daily at bedtime, Trilafon 2 mg daily at  bedtime, Halcion 0.125 mg bedtime as needed 5. Neuropsych: This patient is capable of making decisions on her own behalf. 6. Skin/Wound Care: Routine skin checks 7. Fluids/Electrolytes/Nutrition: Routine I&O with follow-up chemistries 8. Dysphagia. Dysphagia #1 nectar liquids. Monitor hydration. Follow-up speech therapy             -has hx of esophageal stricture 9. Parkinson's disease. Sinemet 25-100  3 times a day             -monitor PD symptoms with therapy/activity and address as indicated.  10. Hyperlipidemia. Pravachol   Post Admission Physician Evaluation: 1. Functional deficits secondary  to left frontal/corona radiata infarct. 2. Patient is admitted to receive collaborative, interdisciplinary care between the physiatrist, rehab nursing staff, and therapy team. 3. Patient's level of medical complexity and substantial therapy needs in context of that medical necessity cannot be provided at a lesser intensity of care such as a SNF. 4. Patient has experienced substantial functional loss from his/her baseline which was documented above under the "Functional History" and "Functional Status" headings.  Judging by the patient's diagnosis, physical exam, and functional history, the patient has potential for functional progress which will result in measurable gains while on inpatient rehab.  These gains will be of substantial and practical use upon discharge  in facilitating mobility and self-care at the household level. 5. Physiatrist will provide 24 hour management of medical needs as well as oversight of the therapy plan/treatment and provide guidance as appropriate regarding the interaction of the two. 6. The Preadmission Screening has been reviewed and patient status is unchanged unless otherwise stated above. 7. 24 hour rehab nursing will assist with bladder management, bowel management, safety, skin/wound care, disease management, medication administration, pain management and patient  education  and help integrate therapy concepts, techniques,education, etc. 8. PT will assess and treat for/with: Lower extremity strength, range of motion, stamina, balance, functional mobility, safety, adaptive techniques and equipment, NMR, family education.   Goals are: supervision to mod I. 9. OT will assess and treat for/with: ADL's, functional mobility, safety, upper extremity strength, adaptive techniques and equipment, NMR, family education.   Goals are: supervision to min assist. Therapy may proceed with showering this patient. 10. SLP will assess and treat for/with: speech, swallowing, cognition.  Goals are: supervision/mod I with swallowing and supervision with cognition. 11. Case Management and Social Worker will assess and treat for psychological issues and discharge planning. 12. Team conference will be held weekly to assess progress toward goals and to determine barriers to discharge. 13. Patient will receive at least 3 hours of therapy per day at least 5 days per week. 14. ELOS: 15-18 days  15. Prognosis:  excellent     Meredith Staggers, MD, [facility] 04/30/2016  Cathlyn Parsons., PA-C 04/30/2016

## 2016-04-30 NOTE — Progress Notes (Signed)
Patient ID: Sarah [name], female   DOB: 1926-06-06, 83 y.o.   MRN: [id number] Patient and family arrived from 83C02 with RN at approximately 1740. Patient and family oriented to rehab process, rehab schedule, health resource notebook, fall prevention plan, and rehab safety plan. Patient resting comfortably in bed with family at bedside.

## 2016-04-30 NOTE — Care Management Note (Signed)
Case Management Note  Patient Details  Name: Sarah [name] MRN: [id number] Date of Birth: October 19, 1926  Subjective/Objective:                    Action/Plan: Pt discharging to CIR today. No further needs per CM.   Expected Discharge Date:  04/30/16               Expected Discharge Plan:  [place]  In-House Referral:     Discharge planning Services     Post Acute Care Choice:    Choice offered to:     DME Arranged:    DME Agency:     HH Arranged:    HH Agency:     Status of Service:  Completed, signed off  If discussed at [employer], dates discussed:    Additional Comments:  Pollie Friar, RN 04/30/2016, 4:29 PM

## 2016-04-30 NOTE — H&P (Signed)
Physical Medicine and Rehabilitation Admission H&P    Chief Complaint  Patient presents with  . Code Stroke  : HPI: Sarah [name] is a 83 y.o. right handed female with history of Parkinson's disease maintained on Sinemet, CVA with residual left-sided weakness maintained on Plavix. Per chart review and patient's son/daughter, and patient, patient lives alone. Used an assistive device prior to admission. She has a home health aide 10-6 7 days a week, recently changed to 10-4. She needed assistance with some dressing and bathing. She does not donn / doff her socks or shoes independently or drive or get her groceries.She was receiving HH therapies prior to admission. One level home no steps to entry. Presented 04/27/2016 with slurred speech. She was hypertensive 190/70. Mild hyponatremia 132. Troponin negative. Cranial CT scan reviewed, unremarkable for acute process. She did not receive TPA. MRI showed acute early subacute infarct within the left frontal centrum semiovale. No acute hemorrhage. MRA with no evidence of large vessel occlusion or aneurysm. Carotid Dopplers with no ICA stenosis. Echocardiogram With ejection fraction 123456 grade 1 diastolic dysfunction. Neurology consulted presently maintained on aspirin and Plavix for CVA prophylaxis 3 months then Plavix alone. Subcutaneous Lovenox for DVT prophylaxis. Currently on a dysphagia #1 nectar thick liquid diet. Physical therapy evaluation completed with recommendations of physical medicine rehabilitation consult.Patient was admitted for a comprehensive rehabilitation program  Review of Systems  Constitutional: Negative for chills and fever.  HENT: Negative for hearing loss and tinnitus.   Eyes: Negative for blurred vision and double vision.  Respiratory: Positive for shortness of breath. Negative for cough.   Cardiovascular: Positive for chest pain. Negative for leg swelling.  Gastrointestinal: Positive for constipation.       GERD    Genitourinary:       Bladder incontinence  Musculoskeletal: Positive for back pain and myalgias.  Skin: Negative for rash.  Neurological: Positive for dizziness, tremors and weakness. Negative for seizures.  Psychiatric/Behavioral: Positive for depression.       Anxiety  All other systems reviewed and are negative.  Past Medical History:  Diagnosis Date  . Bladder incontinence   . Chest pain    S/P nuclear 07/17/2008  normal; EF 88% with no ischemia  . Chronic venous insufficiency    BLE edema, chronic, neg venous US 2/13  . Constipation    hx of  . Depression   . Diverticulitis   . Esophageal stricture    s/p dilation 04/2012  . GERD (gastroesophageal reflux disease)   . History of chicken pox   . Hyperlipidemia   . Hypertension   . Osteoarthritis   . Osteoporosis, postmenopausal 06/02/2011  . Polyp of colon   . Stroke [facility]) 07/03/2014   Past Surgical History:  Procedure Laterality Date  . APPENDECTOMY     age 39  . COLONOSCOPY    . POLYPECTOMY    . TUBAL LIGATION    . UPPER GASTROINTESTINAL ENDOSCOPY     Family History  Problem Relation Age of Onset  . Cancer Son     testicular cancer  . Breast cancer Sister   . Skin cancer Sister   . Colon cancer Neg Hx   . Esophageal cancer Neg Hx   . Rectal cancer Neg Hx   . Stomach cancer Neg Hx    Social History:  reports that she has never smoked. She has never used smokeless tobacco. She reports that she does not drink alcohol or use drugs. Allergies:  Allergies  Allergen Reactions  . Lipitor [Atorvastatin Calcium] Other (See Comments)    Leg weakness,memory loss  . Lovastatin Other (See Comments)    Leg weakness  . Celebrex [Celecoxib] Itching and Rash  . Latex Rash  . Macrobid [employer] Macro] Other (See Comments)    Unknown per patient  . Other Other (See Comments)    Always pads   Facility-Administered Medications Prior to Admission  Medication Dose Route Frequency Provider Last Rate Last Dose   . cyanocobalamin ((VITAMIN B-12)) injection 1,000 mcg  1,000 mcg Intramuscular Q30 days Binnie Rail, MD   1,000 mcg at 06/27/15 1545   Medications Prior to Admission  Medication Sig Dispense Refill  . acetaminophen (TYLENOL) 500 MG tablet Take 1 tablet (500 mg total) by mouth every 8 (eight) hours as needed (for pain). 180 tablet 2  . ALPRAZolam (XANAX) 1 MG tablet Take 1 tablet (1 mg total) by mouth 2 (two) times daily. 60 tablet 1  . carbidopa-levodopa (SINEMET IR) 25-100 MG tablet TAKE 1 TABLET 3 TIMES A DAY. 90 tablet 3  . Cholecalciferol (VITAMIN D PO) Take 1 tablet by mouth daily.     . clopidogrel (PLAVIX) 75 MG tablet Take 1 tablet (75 mg total) by mouth daily. 90 tablet 2  . diclofenac sodium (VOLTAREN) 1 % GEL Apply  To feet 3 times daily prn for pain 1 Tube 5  . furosemide (LASIX) 20 MG tablet TAKE 1 TABLET DAILY AS NEEDED. (Patient taking differently: TAKE 1 TABLET DAILY AS NEEDED FOR EDEMA) 30 tablet 0  . Glucosamine HCl 750 MG TABS Take 1 tablet by mouth 2 (two) times daily.     Marland Kitchen HALCION 0.25 MG tablet TAKE 1/2 TABLET AT BEDTIME AS NEEDED FOR SLEEP. (Patient taking differently: TAKE 0.125mg  AT BEDTIME FOR SLEEP.) 45 tablet 0  . Homeopathic Products (ARNICARE ARNICA EX) Apply 1 application topically daily as needed (pain).    Marland Kitchen ibuprofen (ADVIL,MOTRIN) 200 MG tablet Take 200 mg by mouth every 6 (six) hours as needed for headache.    . nitroGLYCERIN (NITROSTAT) 0.4 MG SL tablet Place 1 tablet (0.4 mg total) under the tongue every 5 (five) minutes as needed for chest pain. 50 tablet prn  . nortriptyline (PAMELOR) 25 MG capsule TAKE 1 CAPSULE AT BEDTIME. 30 capsule 3  . ofloxacin (OCUFLOX) 0.3 % ophthalmic solution Place 1 drop into the right eye 4 (four) times daily.    . pantoprazole (PROTONIX) 40 MG tablet TAKE 1 TABLET ONCE DAILY. 30 tablet 11  . perphenazine (TRILAFON) 2 MG tablet Take 1 tablet (2 mg total) by mouth at bedtime. 30 tablet 2  . pravastatin (PRAVACHOL) 20 MG  tablet TAKE (1/2) TABLET DAILY. 45 tablet 3  . prednisoLONE acetate (PRED FORTE) 1 % ophthalmic suspension Place 1 drop into the right eye every 2 (two) hours while awake.    . VESICARE 10 MG tablet TAKE (1) TABLET DAILY AS NEEDED. (Patient taking differently: TAKE (1) TABLET DAILY AS NEEDED FOR BLADDER) 30 tablet 0    Home: Home Living Family/patient expects to be discharged to:: Private residence Living Arrangements: Alone Available Help at Discharge: Home health Type of Home: House Home Access: Stairs to enter Technical brewer of Steps: 1 Entrance Stairs-Rails: None Home Layout: Two level, Able to live on main level with bedroom/bathroom Alternate Level Stairs-Number of Steps: stays on main Bathroom Shower/Tub: Multimedia programmer: [place]: Environmental consultant - 4 wheels, Shower seat  Lives With: Alone   Functional History:  Prior Function Level of Independence: Needs assistance Gait / Transfers Assistance Needed: Used rollator ADL's / Homemaking Assistance Needed: Needs assistance with dressing/bathing. Does not don/doff socks and shoes independently.  Functional Status:  Mobility: Bed Mobility Overal bed mobility: Needs Assistance Bed Mobility: Supine to Sit Supine to sit: Mod assist Sit to supine: Mod assist General bed mobility comments: assist to lift trunk and scoot to EOB; increased time to initiate and bring legs off bed Transfers Overall transfer level: Needs assistance Equipment used: 4-wheeled walker Transfers: Sit to/from Stand Sit to Stand: Min assist General transfer comment: increased time to rise, assist for balance and cues and occasional assist for UE placement (R reaching below handle on walker) Ambulation/Gait Ambulation/Gait assistance: Min assist Ambulation Distance (Feet): 20 Feet (and 5') Assistive device: 4-wheeled walker Gait Pattern/deviations: Step-through pattern, Decreased stride length, Shuffle, Wide base of  support General Gait Details: decreased safety with turning and assist for balance, safety, cues for walker positioning; remains flexed throughout, did note L LE buckling initially    ADL: ADL Overall ADL's : Needs assistance/impaired Eating/Feeding: NPO Grooming: Minimal assistance, Sitting Upper Body Bathing: Minimal assistance, Sitting Lower Body Bathing: Maximal assistance, Sit to/from stand Upper Body Dressing : Minimal assistance, Sitting Lower Body Dressing: Maximal assistance, Sit to/from stand Toilet Transfer Details (indicate cue type and reason): Pt only able to tolerate sit<>stand with max assist this session due to reports of feeling weak.  General ADL Comments: Pt reporting feeling weak due to not eating. Significantly decreased activity tolerance. Although she requires assistance with ADL at baseline, she demonstrates functional decline with ADL at current level.  Cognition: Cognition Overall Cognitive Status: Impaired/Different from baseline Arousal/Alertness: Awake/alert Orientation Level: Oriented to person, Oriented to place, Oriented to situation, Disoriented to time Attention: Sustained Sustained Attention: Impaired Sustained Attention Impairment: Verbal basic Memory: Impaired Memory Impairment: Decreased recall of new information Awareness: Impaired Awareness Impairment: Anticipatory impairment Problem Solving: Impaired Problem Solving Impairment: Functional basic Safety/Judgment: Impaired Cognition Arousal/Alertness: Awake/alert Behavior During Therapy: WFL for tasks assessed/performed Overall Cognitive Status: Impaired/Different from baseline Area of Impairment: Following commands, Attention Current Attention Level: Sustained Following Commands: Follows one step commands with increased time Problem Solving: Slow processing, Decreased initiation General Comments: Slow processing with ADL tasks but alert and oriented.   Physical Exam: Blood pressure (!)  118/54, pulse 85, temperature 98.2 F (36.8 C), temperature source Oral, resp. rate 18, height 5' (1.524 m), weight 75.5 kg (166 lb 6.4 oz), SpO2 92 %. Physical Exam  Constitutional: She appears well-developed.  HENT:  Head: Normocephalic.  Eyes: EOM are normal. Left eye exhibits no discharge.  Neck: Normal range of motion. Neck supple. No tracheal deviation present. No thyromegaly present.  Cardiovascular: Normal rate, regular rhythm and normal heart sounds.   Respiratory: No respiratory distress. She has no wheezes. She has no rales.  Decreased inspiratory effort but clear to auscultation  GI: Soft. Bowel sounds are normal. She exhibits no distension. There is no tenderness.  Skin: Skin is warm and dry.  Psychiatric: She has a normal mood and affect. Her behavior is normal.  Neurological: She is alert and oriented to person, place, and time. Masked facies Speech is dysarthric but intelligible.  She follows simple commands. Some delay in processing Mild left facial droop Motor: B/L UE 5/5 proximal to distal. No rigidity RLE: 4/5 proximal to distal---some limitations due to pain LLE: 4-/5 proxima to distal. Some pain limitation.  Left sided motor apraxia Sensation grossly intact. DTR's 1+ bilaterally  Medical Problem List and Plan: 1.  Gait disorder/apraxia with dysphagia/dysarthria secondary to left frontal/ corona radiata infarct secondary to small vessel disease as well as history of CVA in the past with left side mild residual weakness  -admit to inpatient rehab 2.  DVT Prophylaxis/Anticoagulation: Subcutaneous Lovenox. Monitor platelet counts and any signs of bleeding 3. Pain Management/hx of chronic low back pain  - Tylenol as needed 4. Mood: Xanax 0.5 mg twice a day, Pamelor 25 mg daily at bedtime, Trilafon 2 mg daily at bedtime, Halcion 0.125 mg bedtime as needed 5. Neuropsych: This patient is capable of making decisions on her own behalf. 6. Skin/Wound Care: Routine  skin checks 7. Fluids/Electrolytes/Nutrition: Routine I&O with follow-up chemistries 8. Dysphagia. Dysphagia #1 nectar liquids. Monitor hydration. Follow-up speech therapy  -has hx of esophageal stricture 9. Parkinson's disease. Sinemet 25-100  3 times a day  -monitor PD symptoms with therapy/activity and address as indicated.  10. Hyperlipidemia. Pravachol   Post Admission Physician Evaluation: 1. Functional deficits secondary  to left frontal/corona radiata infarct. 2. Patient is admitted to receive collaborative, interdisciplinary care between the physiatrist, rehab nursing staff, and therapy team. 3. Patient's level of medical complexity and substantial therapy needs in context of that medical necessity cannot be provided at a lesser intensity of care such as a SNF. 4. Patient has experienced substantial functional loss from his/her baseline which was documented above under the "Functional History" and "Functional Status" headings.  Judging by the patient's diagnosis, physical exam, and functional history, the patient has potential for functional progress which will result in measurable gains while on inpatient rehab.  These gains will be of substantial and practical use upon discharge  in facilitating mobility and self-care at the household level. 5. Physiatrist will provide 24 hour management of medical needs as well as oversight of the therapy plan/treatment and provide guidance as appropriate regarding the interaction of the two. 6. The Preadmission Screening has been reviewed and patient status is unchanged unless otherwise stated above. 7. 24 hour rehab nursing will assist with bladder management, bowel management, safety, skin/wound care, disease management, medication administration, pain management and patient education  and help integrate therapy concepts, techniques,education, etc. 8. PT will assess and treat for/with: Lower extremity strength, range of motion, stamina, balance,  functional mobility, safety, adaptive techniques and equipment, NMR, family education.   Goals are: supervision to mod I. 9. OT will assess and treat for/with: ADL's, functional mobility, safety, upper extremity strength, adaptive techniques and equipment, NMR, family education.   Goals are: supervision to min assist. Therapy may proceed with showering this patient. 10. SLP will assess and treat for/with: speech, swallowing, cognition.  Goals are: supervision/mod I with swallowing and supervision with cognition. 11. Case Management and Social Worker will assess and treat for psychological issues and discharge planning. 12. Team conference will be held weekly to assess progress toward goals and to determine barriers to discharge. 13. Patient will receive at least 3 hours of therapy per day at least 5 days per week. 14. ELOS: 15-18 days       15. Prognosis:  excellent     Meredith Staggers, MD, [facility] 04/30/2016  Cathlyn Parsons., PA-C 04/30/2016

## 2016-04-30 NOTE — Progress Notes (Signed)
Gunn[facility] Fusi [facility] Admission Coordinator Signed Physical Medicine and Rehabilitation  PMR Pre-admission Date of Service: 04/30/2016 2:30 PM  Related encounter: ED to Hosp-Admission (Current) from 04/27/2016 in [place]       [] Hide copied text PMR Admission Coordinator Pre-Admission Assessment  Patient: Sarah [name] is an 83 y.o., female MRN: [id number] DOB: 17-Feb-1927 Height: 5' (152.4 cm) Weight: 75.5 kg (166 lb 6.4 oz)                                                                                                                                                  Insurance Information HMO:     PPO:      PCP:      IPA:      80/20:      OTHER:  PRIMARY: Medicare A & B       Policy#: [id number]      Subscriber: Self CM Name:       Phone#:      Fax#:  Pre-Cert#: Eligible via [employer]       Employer: Retired  Benefits:  Phone #:      Name:  Eff. Date: 08/27/1993     Deduct: $1340      Out of Pocket Max: None      Life Max: None CIR: 100%      SNF: 100% days 1-20; 80% days 21-100 Outpatient: 80%     Co-Pay: 20% Home Health: 100%      Co-Pay: none DME: 80%     Co-Pay: 20% Providers: patient's choice   SECONDARY: BCBS Supplement       Policy#: [id number]      Subscriber: Self CM Name:       Phone#:      Fax#:  Pre-Cert#:       Employer:  Benefits:  Phone #: [phone]     Name:  Eff. Date:      Deduct:       Out of Pocket Max:       Life Max:  CIR:       SNF:  Outpatient:      Co-Pay:  Home Health:       Co-Pay:  DME:      Co-Pay:   Medicaid Application Date:       Case Manager:  Disability Application Date:       Case Worker:   Emergency Contact Information        Contact Information    Name Relation Home Work Mobile   Lebarron,Charles Bonnie Brae Son [phone]  [phone]   [name]   [phone]   Charna Busman    [phone]     Current Medical History  Patient Admitting Diagnosis: Acute early  subacute infarct left frontal centrum semi-ovale.  History of Present Illness: Sarah D Upchurchis a  83 y.o.right handed femalewith history of Parkinson's disease maintained on Sinemet, CVA with residual left-sided weakness maintained on Plavix. Patient lives alone, used an assistive device prior to admission, and she has a home health aide 10-6, 7 days a week. She needed assistance with some dressing and bathing. She does not donn/doff her socks or shoes independently or drive or get her groceries. She was receiving HH therapies after her CVA in 2016. Presented 04/27/2016 with slurred speech. She was hypertensive 190/70. Mild hyponatremia 132. Troponin negative. Cranial CT scan reviewed, unremarkable for acute process. She did not receive TPA. MRI showed acute early subacute infarct within the left frontal centrum semiovale. No acute hemorrhage. MRA with no evidence of large vessel occlusion or aneurysm. Carotid Dopplers with no ICA stenosis. Echocardiogram with ejection fraction [id number] grade 1 diastolic dysfunction. Neurology consulted presently maintained on aspirin and Plavix for CVA prophylaxis 3 months then Plavix alone. Subcutaneous Lovenox for DVT prophylaxis. Currently on a dysphagia 1 nectar-thick liquid diet. Physical therapy evaluation completed with recommendations of physical medicine rehabilitation consult. Patient was admitted for a comprehensive rehabilitation program.   NIH Total: 6  Past Medical History      Past Medical History:  Diagnosis Date  . Bladder incontinence   . Chest pain    S/P nuclear 07/17/2008  normal; EF 88% with no ischemia  . Chronic venous insufficiency    BLE edema, chronic, neg venous US 2/13  . Constipation    hx of  . Depression   . Diverticulitis   . Esophageal stricture    s/p dilation 04/2012  . GERD (gastroesophageal reflux disease)   . History of chicken pox   . Hyperlipidemia   . Hypertension   . Osteoarthritis   .  Osteoporosis, postmenopausal 06/02/2011  . Polyp of colon   . Stroke [facility]) 07/03/2014    Family History  family history includes Breast cancer in her sister; Cancer in her son; Skin cancer in her sister.  Prior Rehab/Hospitalizations:  Has the patient had major surgery during 100 days prior to admission? Yes, cataract surgery on her right eye in January.  Current Medications   Current Facility-Administered Medications:  .  acetaminophen (TYLENOL) tablet 650 mg, 650 mg, Oral, Q4H PRN, 650 mg at 04/30/16 0432 **OR** acetaminophen (TYLENOL) solution 650 mg, 650 mg, Per Tube, Q4H PRN **OR** acetaminophen (TYLENOL) suppository 650 mg, 650 mg, Rectal, Q4H PRN, Vianne Bulls, MD .  ALPRAZolam Duanne Moron) tablet 0.5 mg, 0.5 mg, Oral, BID, Jessica U Vann, DO, 0.5 mg at 04/30/16 0940 .  aspirin EC tablet 325 mg, 325 mg, Oral, Daily, Rosalin Hawking, MD, 325 mg at 04/30/16 0940 .  carbidopa-levodopa (SINEMET IR) 25-100 MG per tablet immediate release 1 tablet, 1 tablet, Oral, TID, Vianne Bulls, MD, 1 tablet at 04/30/16 0940 .  clopidogrel (PLAVIX) tablet 75 mg, 75 mg, Oral, Daily, Ilene Qua Opyd, MD, 75 mg at 04/30/16 0940 .  enoxaparin (LOVENOX) injection 40 mg, 40 mg, Subcutaneous, Q24H, Ilene Qua Opyd, MD, 40 mg at 04/29/16 2129 .  hydrOXYzine (ATARAX/VISTARIL) tablet 10 mg, 10 mg, Oral, TID PRN, Geradine Girt, DO .  labetalol (NORMODYNE,TRANDATE) injection 5 mg, 5 mg, Intravenous, Q2H PRN, Ilene Qua Opyd, MD .  LORazepam (ATIVAN) injection 1 mg, 1 mg, Intravenous, Q4H PRN, Ilene Qua Opyd, MD .  MEDLINE mouth rinse, 15 mL, Mouth Rinse, BID, Jessica U Vann, DO, 15 mL at 04/30/16 0941 .  morphine 2 MG/ML injection 1 mg, 1 mg, Intravenous, Once,  Gardiner Barefoot, NP .  nortriptyline (PAMELOR) capsule 25 mg, 25 mg, Oral, QHS, Ilene Qua Opyd, MD, 25 mg at 04/29/16 2130 .  ofloxacin (OCUFLOX) 0.3 % ophthalmic solution 1 drop, 1 drop, Right Eye, QID, Vianne Bulls, MD, 1 drop at 04/30/16 0941 .   pantoprazole (PROTONIX) EC tablet 40 mg, 40 mg, Oral, Daily, Ilene Qua Opyd, MD, 40 mg at 04/30/16 0940 .  perphenazine (TRILAFON) tablet 2 mg, 2 mg, Oral, QHS, Ilene Qua Opyd, MD, 2 mg at 04/29/16 2130 .  pravastatin (PRAVACHOL) tablet 10 mg, 10 mg, Oral, q1800, Ilene Qua Opyd, MD, 10 mg at 04/29/16 1752 .  prednisoLONE acetate (PRED FORTE) 1 % ophthalmic suspension 1 drop, 1 drop, Right Eye, BID, Mauricio Gerome Apley, MD .  RESOURCE THICKENUP CLEAR, , Oral, PRN, Geradine Girt, DO .  senna-docusate (Senokot-S) tablet 1 tablet, 1 tablet, Oral, QHS PRN, Ilene Qua Opyd, MD .  triazolam (HALCION) tablet 0.125 mg, 0.125 mg, Oral, QHS PRN, Vianne Bulls, MD  Patients Current Diet: DIET - DYS 1 Room service appropriate? Yes; Fluid consistency: Nectar Thick Diet - low sodium heart healthy  Precautions / Restrictions Precautions Precautions: Fall Restrictions Weight Bearing Restrictions: No   Has the patient had 2 or more falls or a fall with injury in the past year?No, 1 fall without injury   Prior Activity Level Limited Community (1-2x/wk): Prior to admission patient had a hired caregiver, [employer] who was with her from about 10-6 daily.  She went out about twice a week for medical appointments, hair, nails, and errands.    Home Assistive Devices / Equipment Home Assistive Devices/Equipment: Gilford Rile (specify type) Home Equipment: Walker - 4 wheels, Shower seat  Prior Device Use: Indicate devices/aids used by the patient prior to current illness, exacerbation or injury? Walker  Prior Functional Level Prior Function Level of Independence: Needs assistance Gait / Transfers Assistance Needed: Used rollator ADL's / Homemaking Assistance Needed: Needs assistance with dressing/bathing. Does not don/doff socks and shoes independently.  Self Care: Did the patient need help bathing, dressing, using the toilet or eating? Needed some help  Indoor Mobility: Did the patient need assistance  with walking from room to room (with or without device)? Independent  Stairs: Did the patient need assistance with internal or external stairs (with or without device)? Needed some help  Functional Cognition: Did the patient need help planning regular tasks such as shopping or remembering to take medications? Needed some help  Current Functional Level Cognition  Arousal/Alertness: Awake/alert Overall Cognitive Status: Impaired/Different from baseline Current Attention Level: Sustained Orientation Level: Oriented to person, Oriented to place, Oriented to situation, Disoriented to time Following Commands: Follows one step commands with increased time General Comments: Slow processing with ADL tasks but alert and oriented.  Attention: Sustained Sustained Attention: Impaired Sustained Attention Impairment: Verbal basic Memory: Impaired Memory Impairment: Decreased recall of new information Awareness: Impaired Awareness Impairment: Anticipatory impairment Problem Solving: Impaired Problem Solving Impairment: Functional basic Safety/Judgment: Impaired    Extremity Assessment (includes Sensation/Coordination)  Upper Extremity Assessment: Defer to OT evaluation RUE Deficits / Details: Lag with coordination testing. LUE Deficits / Details: Weakness at baseline due to previous CVA. Intention tremor with coordination testing at end of session. Slight dysmetria with finger to nose testing.  Lower Extremity Assessment: Overall WFL for tasks assessed    ADLs  Overall ADL's : Needs assistance/impaired Eating/Feeding: NPO Grooming: Minimal assistance, Sitting Upper Body Bathing: Minimal assistance, Sitting Lower Body Bathing: Maximal assistance, Sit  to/from stand Upper Body Dressing : Minimal assistance, Sitting Lower Body Dressing: Maximal assistance, Sit to/from stand Toilet Transfer Details (indicate cue type and reason): Pt only able to tolerate sit<>stand with max assist this  session due to reports of feeling weak.  General ADL Comments: Pt reporting feeling weak due to not eating. Significantly decreased activity tolerance. Although she requires assistance with ADL at baseline, she demonstrates functional decline with ADL at current level.    Mobility  Overal bed mobility: Needs Assistance Bed Mobility: Supine to Sit Supine to sit: Mod assist Sit to supine: Mod assist General bed mobility comments: assist to lift trunk and scoot to EOB; increased time to initiate and bring legs off bed    Transfers  Overall transfer level: Needs assistance Equipment used: 4-wheeled walker Transfers: Sit to/from Stand Sit to Stand: Min assist General transfer comment: increased time to rise, assist for balance and cues and occasional assist for UE placement (R reaching below handle on walker)    Ambulation / Gait / Stairs / Wheelchair Mobility  Ambulation/Gait Ambulation/Gait assistance: Museum/gallery curator (Feet): 20 Feet (and 5') Assistive device: 4-wheeled walker Gait Pattern/deviations: Step-through pattern, Decreased stride length, Shuffle, Wide base of support General Gait Details: decreased safety with turning and assist for balance, safety, cues for walker positioning; remains flexed throughout, did note L LE buckling initially    Posture / Balance Dynamic Sitting Balance Sitting balance - Comments: reminders to keep from leaning to R Balance Overall balance assessment: Needs assistance Sitting-balance support: Feet supported Sitting balance-Leahy Scale: Fair Sitting balance - Comments: reminders to keep from leaning to R Standing balance support: Bilateral upper extremity supported, During functional activity Standing balance-Leahy Scale: Poor Standing balance comment: flexed posture; able to straighten up slightly with cues    Special needs/care consideration BiPAP/CPAP: No CPM: No Continuous Drip IV: No Dialysis: No         Life Vest:  No Oxygen: No Special Bed: No Trach Size: No Wound Vac (area): No       Skin: WDL                                Bowel mgmt: 04/29/16 Bladder mgmt: Continent with some leakage, same as PTA Diabetic mgmt: No,[id number]      Previous Home Environment Living Arrangements: Alone  Lives With: Alone Available Help at Discharge: Home health Type of Home: House Home Layout: Two level, Able to live on main level with bedroom/bathroom Alternate Level Stairs-Number of Steps: stays on main Home Access: Stairs to enter Entrance Stairs-Rails: None Entrance Stairs-Number of Steps: 1 Bathroom Shower/Tub: Multimedia programmer: Standard Home Care Services: Yes Type of Home Care Services: Homehealth aide  Discharge Living Setting Plans for Discharge Living Setting: Patient's home, Other (Comment) (townhouse ) Type of Home at Discharge: Other (Comment) (townhouse ) Discharge Home Layout: Two level, Able to live on main level with bedroom/bathroom Alternate Level Stairs-Rails:  (unknown did not go up any more PTA) Discharge Home Access: Stairs to enter Entrance Stairs-Rails: None Entrance Stairs-Number of Steps: 1 Discharge Bathroom Shower/Tub: Other (comment) (has both a tub & a shower but patient preferred to spongue ) Discharge Bathroom Toilet: Handicapped height Discharge Bathroom Accessibility: Yes How Accessible: Accessible via walker Does the patient have any problems obtaining your medications?: No  Social/Family/Support Systems Patient Roles: Parent Contact Information: Shoshanah Amoah [phone]; Daughter [name] [phone] Anticipated Caregiver: [name] is her  hired caregiver [phone] Anticipated Caregiver's Contact Information: see above  Ability/Limitations of Caregiver: [name] currently works from about 10-6 Caregiver Availability: Intermittent Discharge Plan Discussed with Primary Caregiver: Yes Is Caregiver In Agreement with Plan?: Yes Does  Caregiver/Family have Issues with Lodging/Transportation while Pt is in Rehab?: No  Goals/Additional Needs Patient/Family Goal for Rehab: PT, OT, SLP Supervision  Expected length of stay: 12-15 days  Cultural Considerations: None Dietary Needs: Dys.1 textures and nectar-thick liquids  Equipment Needs: TBD Special Service Needs: None Additional Information: Son and daughter have looked at local SNFs and said they would also consider hiring additional help for their mom if needed.  So they know that she will likely need 24/7 assist after CIR and are exploring their options.  Pt/Family Agrees to Admission and willing to participate: Yes Program Orientation Provided & Reviewed with Pt/Caregiver Including Roles  & Responsibilities: Yes Additional Information Needs: None Information Needs to be Provided By: N/A   Decrease burden of Care through IP rehab admission: Diet advancement  Possible need for SNF placement upon discharge: Potential   Patient Condition: This patient's medical and functional status has changed since the consult dated 04/28/16 in which the Rehabilitation Physician determined and documented that the patient was potentially appropriate for intensive rehabilitative care in an inpatient rehabilitation facility. Issues have been addressed and update has been discussed with Dr. Naaman Plummer and patient now appropriate for inpatient rehabilitation. Will admit to inpatient rehab today.   Preadmission Screen Completed By:  Gunnar Fusi, 04/30/2016 2:30 PM ______________________________________________________________________   Discussed status with Dr. Naaman Plummer on 04/30/16 at 1445 and received telephone approval for admission today.  Admission Coordinator:  Gunnar Fusi, time 1445/Date 04/30/16       Cosigned by: Meredith Staggers, MD at 04/30/2016 2:49 PM  Revision History

## 2016-05-01 ENCOUNTER — Inpatient Hospital Stay (HOSPITAL_COMMUNITY): Payer: Medicare Other | Admitting: Speech Pathology

## 2016-05-01 ENCOUNTER — Inpatient Hospital Stay (HOSPITAL_COMMUNITY): Payer: Medicare Other | Admitting: Occupational Therapy

## 2016-05-01 ENCOUNTER — Inpatient Hospital Stay (HOSPITAL_COMMUNITY): Payer: Medicare Other | Admitting: Physical Therapy

## 2016-05-01 DIAGNOSIS — I679 Cerebrovascular disease, unspecified: Secondary | ICD-10-CM

## 2016-05-01 DIAGNOSIS — Z8719 Personal history of other diseases of the digestive system: Secondary | ICD-10-CM

## 2016-05-01 MED ORDER — MUSCLE RUB 10-15 % EX CREA
TOPICAL_CREAM | CUTANEOUS | Status: DC | PRN
  Administered 2016-05-01 – 2016-05-04 (×2): via TOPICAL
  Administered 2016-05-05: 1 via TOPICAL
  Filled 2016-05-01: qty 85

## 2016-05-01 MED ORDER — SODIUM CHLORIDE 0.45 % IV SOLN
INTRAVENOUS | Status: AC
  Administered 2016-05-01: via INTRAVENOUS

## 2016-05-01 MED ORDER — ASPIRIN 81 MG PO CHEW
324.0000 mg | CHEWABLE_TABLET | Freq: Every day | ORAL | Status: DC
  Administered 2016-05-01 – 2016-05-13 (×13): 324 mg
  Filled 2016-05-01 (×13): qty 4

## 2016-05-01 MED ORDER — PANTOPRAZOLE SODIUM 40 MG PO PACK
40.0000 mg | PACK | Freq: Every day | ORAL | Status: DC
  Administered 2016-05-01 – 2016-05-03 (×3): 40 mg
  Filled 2016-05-01: qty 20

## 2016-05-01 MED ORDER — TRAMADOL HCL 50 MG PO TABS
50.0000 mg | ORAL_TABLET | Freq: Four times a day (QID) | ORAL | Status: DC | PRN
Start: 2016-05-01 — End: 2016-05-02
  Administered 2016-05-01 – 2016-05-02 (×3): 50 mg via ORAL
  Filled 2016-05-01 (×3): qty 1

## 2016-05-01 NOTE — Evaluation (Signed)
Occupational Therapy Assessment and Plan  Patient Details  Name: Sarah [name] MRN: [id number] Date of Birth: [birth year]  OT Diagnosis: abnormal posture, acute pain, cognitive deficits, hemiplegia affecting dominant side, muscle weakness (generalized) and coordination disorder Rehab Potential: Rehab Potential (ACUTE ONLY): Good ELOS: 10-14 days   Today's Date: 05/01/2016 OT Individual Time: 9678-9381 OT Individual Time Calculation (min): 72 min     Problem List:  Patient Active Problem List   Diagnosis Date Noted  . Small vessel disease, cerebrovascular 04/30/2016  . Acute CVA (cerebrovascular accident) ([place])   . Dysphagia, post-stroke   . History of cerebrovascular accident (CVA) with residual deficit   . Benign essential HTN   . Slurred speech 04/27/2016  . CVA (cerebral vascular accident) ([place]) 04/27/2016  . Hypertension 04/27/2016  . Dysarthria 04/27/2016  . History of stroke 04/27/2016  . Acute focal neurological deficit 04/27/2016  . Pansinusitis 10/20/2015  . Primary Parkinsonism ([place]) 09/26/2015  . Peripheral edema 05/12/2015  . DOE (dyspnea on exertion) 02/12/2015  . Neck pain 01/10/2015  . Routine general medical examination at a health care facility 09/17/2014  . Hyperglycemia 07/09/2014  . Acute ischemic right MCA stroke ([place]) 07/03/2014  . Degenerative arthritis of knee, bilateral 03/01/2014  . Fall 10/05/2013  . Hyponatremia 10/05/2013  . Memory deficits 10/05/2013  . Arthritis of sacroiliac joint ([place]) 09/11/2013  . Lumbosacral spondylosis without myelopathy 02/26/2013  . Abnormality of gait 12/15/2012  . Right shoulder pain   . Vitamin B12 deficiency 04/13/2012  . Hypercholesterolemia 11/05/2011  . Osteoporosis, postmenopausal 06/02/2011  . Mixed incontinence urge and stress 06/02/2011  . Anxiety   . Edema 04/06/2011  . Tachycardia 04/06/2011  . Osteoarthritis     Past Medical History:  Past Medical History:  Diagnosis Date  . Bladder  incontinence   . Chest pain    S/P nuclear 07/17/2008  normal; EF 88% with no ischemia  . Chronic venous insufficiency    BLE edema, chronic, neg venous US 2/13  . Constipation    hx of  . Depression   . Diverticulitis   . Esophageal stricture    s/p dilation 04/2012  . GERD (gastroesophageal reflux disease)   . History of chicken pox   . Hyperlipidemia   . Hypertension   . Osteoarthritis   . Osteoporosis, postmenopausal 06/02/2011  . Polyp of colon   . Stroke [facility]) 07/03/2014   Past Surgical History:  Past Surgical History:  Procedure Laterality Date  . APPENDECTOMY     age 63  . COLONOSCOPY    . POLYPECTOMY    . TUBAL LIGATION    . UPPER GASTROINTESTINAL ENDOSCOPY      Assessment & Plan Clinical Impression: Patient is a 83 y.o. year old female with history of Parkinson's disease maintained on Sinemet, CVA with residual left-sided weakness maintained on Plavix. Per chart review and patient's son/daughter, and patient, patient lives alone. Used an assistive device prior to admission. She has a home health aide 10-6 7 days a week, recently changed to 10-4. She needed assistance with some dressing and bathing. She does not donn [name] her socks or shoes independently or drive or get her groceries.She was receiving HH therapies prior to admission.One level home no steps to entry. Presented 04/27/2016 with slurred speech. She was hypertensive 190/70. Mild hyponatremia 132. Troponin negative. Cranial CT scan reviewed, unremarkable for acute process. She did not receive TPA. MRI showed acute early subacute infarct within the left frontal centrum semiovale. No acute hemorrhage. MRA with  no evidence of large vessel occlusion or aneurysm. Carotid Dopplers with no ICA stenosis. Echocardiogram With ejection fraction 47% grade 1 diastolic dysfunction. Neurology consulted presently maintained on aspirin and Plavix for CVA prophylaxis 3 months then Plavix alone. Subcutaneous Lovenox for DVT  prophylaxis. Currently on a dysphagia #1 nectar thick liquid diet. Physical therapy evaluation completed with recommendations of physical medicine rehabilitation consult.Patient was admitted for a comprehensive rehabilitation program.  Patient transferred to CIR on 04/30/2016 .    Patient currently requires mod with basic self-care skills secondary to muscle weakness, decreased cardiorespiratoy endurance, decreased coordination and decreased motor planning, decreased problem solving, decreased safety awareness and decreased memory and decreased sitting balance, decreased standing balance, decreased postural control, hemiplegia and decreased balance strategies.  Prior to hospitalization, patient could complete ADLs with min.  Patient will benefit from skilled intervention to decrease level of assist with basic self-care skills prior to discharge home with care partner.  Anticipate patient will require 24 hour supervision and minimal physical assistance and follow up home health.  OT - End of Session Activity Tolerance: Decreased this session Endurance Deficit: Yes Endurance Deficit Description: multiple rest breaks secondary to fatigue OT Assessment Rehab Potential (ACUTE ONLY): Good OT Patient demonstrates impairments in the following area(s): Balance;Behavior;Cognition;Endurance;Motor;Pain;Perception;Safety OT Basic ADL's Functional Problem(s): Grooming;Bathing;Dressing;Toileting OT Transfers Functional Problem(s): Toilet;Tub/Shower OT Additional Impairment(s): Fuctional Use of Upper Extremity OT Plan OT Intensity: Minimum of 1-2 x/day, 45 to 90 minutes OT Frequency: 5 out of 7 days OT Duration/Estimated Length of Stay: 10-14 days OT Treatment/Interventions: Balance/vestibular training;Cognitive remediation/compensation;Community reintegration;Discharge planning;Functional mobility training;Psychosocial support;Therapeutic Activities;UE/LE Coordination activities;Patient/family  education;DME/adaptive equipment instruction;Pain management;UE/LE Strength taining/ROM;Wheelchair propulsion/positioning;Therapeutic Exercise;Neuromuscular re-education;Self Care/advanced ADL retraining OT Self Feeding Anticipated Outcome(s): supervision OT Basic Self-Care Anticipated Outcome(s): supervision - min A OT Toileting Anticipated Outcome(s): supervision OT Bathroom Transfers Anticipated Outcome(s): supervision OT Recommendation Patient destination: Home Follow Up Recommendations: 24 hour supervision/assistance;Home health OT Equipment Recommended: To be determined   Skilled Therapeutic Intervention  Upon entering the room, pt supine in bed with 7/10 c/o headache. OT notified RN this session. Pt reports she did not sleep well and needed coaxing for participation this session. Pt moving very slowly and required 20 minutes to sit EOB. Pt performed supine >sit with mod A to EOB. Pt with slight R lateral lean in sitting. Sit <>stand from bed with mod lifting assistance. Pt ambulating in room with RW 15' to recliner chair with  Min A. Pt taking seated rest break secondary to fatigue and increased head pain. Pt declined bathing and dressing this session. Pt required set up A, verbal cues for swallowing strategies, and assistance to cut and open up food for meal. Pt remained seated in recliner chair with NT arriving to take over supervision. Call bell and all needed items within reach upon exiting the room.   OT Evaluation Precautions/Restrictions  Precautions Precautions: Fall Restrictions Weight Bearing Restrictions: No  Pain Pain Assessment Pain Assessment: 0-10 Pain Score: 7  Pain Type: Acute pain Pain Location: Head Pain Orientation: Anterior Pain Descriptors / Indicators: Aching Pain Onset: On-going Pain Intervention(s): RN made aware Multiple Pain Sites: No Home Living/Prior Functioning Home Living Family/patient expects to be discharged to:: Private residence Living  Arrangements: Alone Available Help at Discharge: Family, Friend(s), Available PRN/intermittently, Personal care attendant Type of Home: House Home Access: Stairs to enter Entrance Stairs-Rails: None Home Layout: Two level, Able to live on main level with bedroom/bathroom Alternate Level Stairs-Number of Steps: stays on main Bathroom Shower/Tub: Holiday representative  Toilet: Standard  Lives With: Alone Prior Function Level of Independence: Needs assistance with ADLs, Needs assistance with homemaking, Requires assistive device for independence Vision/Perception  Vision- History Patient Visual Report: No change from baseline  Cognition Overall Cognitive Status: Impaired/Different from baseline Arousal/Alertness: Awake/alert Orientation Level: Person;Place Year: 2018 Month: January Day of Week: Incorrect Memory: Impaired Memory Impairment: Decreased recall of new information;Decreased short term memory Decreased Short Term Memory: Verbal basic Immediate Memory Recall: Sock;Blue;Bed Memory Recall:  (0/3) Attention: Sustained Sustained Attention: Appears intact Awareness: Impaired Awareness Impairment: Anticipatory impairment Problem Solving: Impaired Problem Solving Impairment: Functional basic Safety/Judgment: Appears intact Sensation Coordination Gross Motor Movements are Fluid and Coordinated: No Fine Motor Movements are Fluid and Coordinated: No Motor  Motor Motor: Hemiplegia Mobility  Transfers Transfers: Sit to Stand;Stand to Sit Sit to Stand: 3: Mod assist Stand to Sit: 3: Mod assist   Balance Balance Balance Assessed: Yes Dynamic Sitting Balance Dynamic Sitting - Level of Assistance: 5: Stand by assistance Dynamic Standing Balance Dynamic Standing - Level of Assistance: 4: Min assist Extremity/Trunk Assessment RUE Assessment RUE Assessment: Exceptions to [facility] LUE Assessment LUE Assessment: Exceptions to [facility] (residual weakness from prior CVA)   See  Function Navigator for Current Functional Status.   Refer to Care Plan for Long Term Goals  Recommendations for other services: None    Discharge Criteria: Patient will be discharged from OT if patient refuses treatment 3 consecutive times without medical reason, if treatment goals not met, if there is a change in medical status, if patient makes no progress towards goals or if patient is discharged from hospital.  The above assessment, treatment plan, treatment alternatives and goals were discussed and mutually agreed upon: by patient  Gypsy Decant 05/01/2016, 12:49 PM

## 2016-05-01 NOTE — Evaluation (Signed)
Speech Language Pathology Assessment and Plan  Patient Details  Name: Sarah [name] MRN: [id number] Date of Birth: Nov 07, 1926  SLP Diagnosis: Dysphagia;Aphasia;Cognitive Impairments  Rehab Potential: Good ELOS: 15-18 days    Today's Date: 05/01/2016 SLP Individual Time: 1430-1540 SLP Individual Time Calculation (min): 70 min   Problem List:  Patient Active Problem List   Diagnosis Date Noted  . Small vessel disease, cerebrovascular 04/30/2016  . Acute CVA (cerebrovascular accident) ([place])   . Dysphagia, post-stroke   . History of cerebrovascular accident (CVA) with residual deficit   . Benign essential HTN   . Slurred speech 04/27/2016  . CVA (cerebral vascular accident) ([place]) 04/27/2016  . Hypertension 04/27/2016  . Dysarthria 04/27/2016  . History of stroke 04/27/2016  . Acute focal neurological deficit 04/27/2016  . Pansinusitis 10/20/2015  . Primary Parkinsonism ([place]) 09/26/2015  . Peripheral edema 05/12/2015  . DOE (dyspnea on exertion) 02/12/2015  . Neck pain 01/10/2015  . Routine general medical examination at a health care facility 09/17/2014  . Hyperglycemia 07/09/2014  . Acute ischemic right MCA stroke ([place]) 07/03/2014  . Degenerative arthritis of knee, bilateral 03/01/2014  . Fall 10/05/2013  . Hyponatremia 10/05/2013  . Memory deficits 10/05/2013  . Arthritis of sacroiliac joint ([place]) 09/11/2013  . Lumbosacral spondylosis without myelopathy 02/26/2013  . Abnormality of gait 12/15/2012  . Right shoulder pain   . Vitamin B12 deficiency 04/13/2012  . Hypercholesterolemia 11/05/2011  . Osteoporosis, postmenopausal 06/02/2011  . Mixed incontinence urge and stress 06/02/2011  . Anxiety   . Edema 04/06/2011  . Tachycardia 04/06/2011  . Osteoarthritis    Past Medical History:  Past Medical History:  Diagnosis Date  . Bladder incontinence   . Chest pain    S/P nuclear 07/17/2008  normal; EF 88% with no ischemia  . Chronic venous insufficiency    BLE edema,  chronic, neg venous US 2/13  . Constipation    hx of  . Depression   . Diverticulitis   . Esophageal stricture    s/p dilation 04/2012  . GERD (gastroesophageal reflux disease)   . History of chicken pox   . Hyperlipidemia   . Hypertension   . Osteoarthritis   . Osteoporosis, postmenopausal 06/02/2011  . Polyp of colon   . Stroke [facility]) 07/03/2014   Past Surgical History:  Past Surgical History:  Procedure Laterality Date  . APPENDECTOMY     age 21  . COLONOSCOPY    . POLYPECTOMY    . TUBAL LIGATION    . UPPER GASTROINTESTINAL ENDOSCOPY      Assessment / Plan / Recommendation Clinical Impression Sarah D Upchurchis a 83 y.o.right handed femalewith history of Parkinson's disease maintained on Sinemet, CVA with residual left-sided weakness maintained on Plavix. Per chart review and patient's son/daughter, and patient, patient lives alone. Used an assistive device prior to admission. She has a home health aide 10-6 7 days a week, recently changed to 10-4. She needed assistance with some dressing and bathing. She does not donn [name] her socks or shoes independently or drive or get her groceries.She was receiving HH therapies prior to admission.One level home no steps to entry. Presented 04/27/2016 with slurred speech. She was hypertensive 190/70. Mild hyponatremia 132. Troponin negative. Cranial CT scan reviewed, unremarkable for acute process. She did not receive TPA. MRI showed acute early subacute infarct within the left frontal centrum semiovale. No acute hemorrhage. MRA with no evidence of large vessel occlusion or aneurysm. Carotid Dopplers with no ICA stenosis. Echocardiogram With ejection  fraction 09% grade 1 diastolic dysfunction. Neurology consulted presently maintained on aspirin and Plavix for CVA prophylaxis 3 months then Plavix alone. Subcutaneous Lovenox for DVT prophylaxis. Currently on a dysphagia #1 nectar thick liquid diet. Physical therapy evaluation completed with  recommendations of physical medicine rehabilitation consult. Patient was admitted for a comprehensive rehabilitation program 04/30/16.  Bedside swallow and cognitive-linguistic evaluations completed. SLP made aware that patient has a significant regurgitation event this morning after breakfast due to food getting stuck in her stricture.  Given patient's oral weakness and decreased coordination as well as esophageal history, despite oral clearance of Dys.2 textures it is recommended that patient's diet be downgraded to Dys.1 textures with continuation of thin liquids and full supervision. Patient speech is intelligibile with receptive language intact.  However, she demonstrates anomia in conversation as well as cognitive impairments characterized by poor recall of new, short term information as well as decreased anticipatory awareness.  This impacts the patient's overall safety with functional self-care tasks and discharge planning.  Patient would benefit from skilled SLP intervention in order to maximize her functional independence prior to discharge. Unsure at this time if patient will require 24 hour supervision at home, but will likely need follow up SLP services.    Skilled Therapeutic Interventions          Cognitive-linguistic and bedside swallow evaluations completed with results and recommendations reviewed with patient and family.   Skilled treatment initiated with focused on education, use, and carryover of alternating solids and liquids with Max faded to Mod assist verbal cues.      SLP Assessment  Patient will need skilled Speech Lanaguage Pathology Services during CIR admission    Recommendations  SLP Diet Recommendations: Dysphagia 1 (Puree);Thin Liquid Administration via: Cup;Straw Medication Administration: Whole meds with puree (followed by thin liquid washes ) Supervision: Staff to assist with self feeding;Full supervision/cueing for compensatory strategies;Patient able to self  feed;Trained caregiver to feed patient Compensations: Slow rate;Small sips/bites;Follow solids with liquid Postural Changes and/or Swallow Maneuvers: Seated upright 90 degrees Oral Care Recommendations: Oral care BID Patient destination:  (home versus SNF) Follow up Recommendations: 24 hour supervision/assistance (at this time) Equipment Recommended: To be determined    SLP Frequency 3 to 5 out of 7 days   SLP Duration  SLP Intensity  SLP Treatment/Interventions 15-18 days  Minumum of 1-2 x/day, 30 to 90 minutes  Cognitive remediation/compensation;Cueing hierarchy;Dysphagia/aspiration precaution training;Environmental controls;Functional tasks;Medication managment;Patient/family education;Speech/Language facilitation    Pain Pain Assessment Pain Assessment: 0-10 Pain Score: 3  Pain Type: Acute pain Pain Location: Head Pain Orientation: Anterior Pain Descriptors / Indicators: Aching Pain Onset: On-going Patients Stated Pain Goal: 2 Pain Intervention(s): RN made aware;Distraction Multiple Pain Sites: No  Prior Functioning Cognitive/Linguistic Baseline: Within functional limits (with basic ) Type of Home: House  Lives With: Alone Available Help at Discharge: Family;Friend(s);Available PRN/intermittently;Personal care attendant Vocation: Retired  Function:  Eating Eating   Modified Consistency Diet: Yes Eating Assist Level: Set up assist for;Supervision or verbal cues;Helper scoops food on utensil   Eating Set Up Assist For: Opening containers;Cutting food Helper [place] on Utensil: Occasionally     Cognition Comprehension Comprehension assist level: Understands complex 90% of the time/cues 10% of the time  Expression   Expression assist level: Expresses basic 75 - 89% of the time/requires cueing 10 - 24% of the time. Needs helper to occlude trach/needs to repeat words.  Social Interaction Social Interaction assist level: Interacts appropriately 90% of the time -  Needs monitoring  or encouragement for participation or interaction.  Problem Solving Problem solving assist level: Solves basic 90% of the time/requires cueing < 10% of the time  Memory Memory assist level: Recognizes or recalls 50 - 74% of the time/requires cueing 25 - 49% of the time   Short Term Goals: Week 1: SLP Short Term Goal 1 (Week 1): Patient will consume trials of Dys.2 textures, with Mod I recall to alternate solids and liquids to effectively prevent s/s of regurgitation.  SLP Short Term Goal 2 (Week 1): Patient will utilize word finding strategies in strucutred tasks with Mod assist question cues.  SLP Short Term Goal 3 (Week 1): Patient will utilize comepsatory strategies for short term recall of new and daily information with Mod assist question cues.   Refer to Care Plan for Long Term Goals  Recommendations for other services: None   Discharge Criteria: Patient will be discharged from SLP if patient refuses treatment 3 consecutive times without medical reason, if treatment goals not met, if there is a change in medical status, if patient makes no progress towards goals or if patient is discharged from hospital.  The above assessment, treatment plan, treatment alternatives and goals were discussed and mutually agreed upon: by patient and by family  Carmelia Roller., CCC-SLP [phone]  [place] 05/01/2016, 4:30 PM

## 2016-05-01 NOTE — Progress Notes (Signed)
After breakfast this am patient reports feeling something lodged. When taking a sip of liquid patient coughs and spits up phlegm. MD Kirsteins notified. Patient able to have a large emesis episode about 1100 this morning. Patient reports relief with that. Emesis appeared to contain some large egg particles and sausage from breakfast. Patient resting comfortably now and able to eat ice cream with no problem. Continue to monitor.

## 2016-05-01 NOTE — Evaluation (Signed)
Physical Therapy Assessment and Plan  Patient Details  Name: Sarah [name] MRN: [id number] Date of Birth: [birth year]  PT Diagnosis: Abnormal posture, Abnormality of gait, Cognitive deficits, Hemiplegia non-dominant and Impaired sensation Rehab Potential: Good ELOS: 10-14   Today's Date: 05/01/2016 PT Individual Time: 4665-9935      Problem List:  Patient Active Problem List   Diagnosis Date Noted  . Small vessel disease, cerebrovascular 04/30/2016  . Acute CVA (cerebrovascular accident) ([place])   . Dysphagia, post-stroke   . History of cerebrovascular accident (CVA) with residual deficit   . Benign essential HTN   . Slurred speech 04/27/2016  . CVA (cerebral vascular accident) ([place]) 04/27/2016  . Hypertension 04/27/2016  . Dysarthria 04/27/2016  . History of stroke 04/27/2016  . Acute focal neurological deficit 04/27/2016  . Pansinusitis 10/20/2015  . Primary Parkinsonism ([place]) 09/26/2015  . Peripheral edema 05/12/2015  . DOE (dyspnea on exertion) 02/12/2015  . Neck pain 01/10/2015  . Routine general medical examination at a health care facility 09/17/2014  . Hyperglycemia 07/09/2014  . Acute ischemic right MCA stroke ([place]) 07/03/2014  . Degenerative arthritis of knee, bilateral 03/01/2014  . Fall 10/05/2013  . Hyponatremia 10/05/2013  . Memory deficits 10/05/2013  . Arthritis of sacroiliac joint ([place]) 09/11/2013  . Lumbosacral spondylosis without myelopathy 02/26/2013  . Abnormality of gait 12/15/2012  . Right shoulder pain   . Vitamin B12 deficiency 04/13/2012  . Hypercholesterolemia 11/05/2011  . Osteoporosis, postmenopausal 06/02/2011  . Mixed incontinence urge and stress 06/02/2011  . Anxiety   . Edema 04/06/2011  . Tachycardia 04/06/2011  . Osteoarthritis     Past Medical History:  Past Medical History:  Diagnosis Date  . Bladder incontinence   . Chest pain    S/P nuclear 07/17/2008  normal; EF 88% with no ischemia  . Chronic venous insufficiency    BLE  edema, chronic, neg venous US 2/13  . Constipation    hx of  . Depression   . Diverticulitis   . Esophageal stricture    s/p dilation 04/2012  . GERD (gastroesophageal reflux disease)   . History of chicken pox   . Hyperlipidemia   . Hypertension   . Osteoarthritis   . Osteoporosis, postmenopausal 06/02/2011  . Polyp of colon   . Stroke [facility]) 07/03/2014   Past Surgical History:  Past Surgical History:  Procedure Laterality Date  . APPENDECTOMY     age 41  . COLONOSCOPY    . POLYPECTOMY    . TUBAL LIGATION    . UPPER GASTROINTESTINAL ENDOSCOPY      Assessment & Plan Clinical Impression: Patient is a 83 y.o.right handed femalewith history of Parkinson's disease maintained on Sinemet, CVA with residual left-sided weakness maintained on Plavix. Per chart review and patient's son/daughter, and patient, patient lives alone. Used an assistive device prior to admission. She has a home health aide 10-6 7 days a week, recently changed to 10-4. She needed assistance with some dressing and bathing. She does not donn [name] her socks or shoes independently or drive or get her groceries.She was receiving HH therapies prior to admission.One level home no steps to entry. Presented 04/27/2016 with slurred speech. She was hypertensive 190/70. Mild hyponatremia 132. Troponin negative. Cranial CT scan reviewed, unremarkable for acute process. She did not receive TPA. MRI showed acute early subacute infarct within the left frontal centrum semiovale. No acute hemorrhage. MRA with no evidence of large vessel occlusion or aneurysm. Carotid Dopplers with no ICA stenosis. Echocardiogram With ejection  fraction 72% grade 1 diastolic dysfunction. Neurology consulted presently maintained on aspirin and Plavix for CVA prophylaxis 3 months then Plavix alone. Subcutaneous Lovenox for DVT prophylaxis. Currently on a dysphagia #1 nectar thick liquid diet. Patient transferred to CIR on 04/30/2016 .   Patient currently  requires mod with mobility secondary to muscle weakness, decreased cardiorespiratoy endurance, decreased coordination, decreased memory and delayed processing and decreased sitting balance, decreased standing balance, decreased postural control, hemiplegia and decreased balance strategies.  Prior to hospitalization, patient was modified independent  with mobility and lived with Alone in a House home.  Home access is  Stairs to enter.  Patient will benefit from skilled PT intervention to maximize safe functional mobility, minimize fall risk and decrease caregiver burden for planned discharge home with 24 hour supervision.  Anticipate patient will benefit from follow up [place] at discharge.  PT - End of Session Activity Tolerance: Tolerates 10 - 20 min activity with multiple rests Endurance Deficit: Yes PT Assessment Rehab Potential (ACUTE/IP ONLY): Good Barriers to Discharge: Decreased caregiver support PT Patient demonstrates impairments in the following area(s): Balance;Behavior;Endurance;Motor;Nutrition;Pain;Perception;Safety;Sensory;Skin Integrity PT Transfers Functional Problem(s): Bed Mobility;Bed to Chair;Car;Furniture;Floor PT Locomotion Functional Problem(s): Ambulation;Wheelchair Mobility;Stairs PT Plan PT Intensity: Minimum of 1-2 x/day ,45 to 90 minutes PT Frequency: 5 out of 7 days PT Duration Estimated Length of Stay: 10-14 PT Treatment/Interventions: Ambulation/gait training;Balance/vestibular training;Cognitive remediation/compensation;Community reintegration;Discharge planning;DME/adaptive equipment instruction;Functional electrical stimulation;Disease management/prevention;Functional mobility training;Neuromuscular re-education;Patient/family education;Pain management;Psychosocial support;Splinting/orthotics;UE/LE Strength taining/ROM;Stair training;Therapeutic Activities;UE/LE Coordination activities;Therapeutic Exercise;Visual/perceptual remediation/compensation;Wheelchair  propulsion/positioning PT Transfers Anticipated Outcome(s): Supervisoin assist with LRAD  PT Locomotion Anticipated Outcome(s): supervision assist for household distances.  PT Recommendation Recommendations for Other Services: Therapeutic Recreation consult Follow Up Recommendations: Home health PT Patient destination: Home Equipment Recommended: To be determined      Skilled Therapeutic Intervention PT attempted to see patient in AM, but patient was not feeling well after taking medication, and noted to be vomiting into emesis bag; RN present and aware. PT returned in PM to perform Evaluation and initiate treatment; see bleow for results. While performed car transfer with mod assist from PT, patient notes increased nausea. Then Emesis of pain medication that RN had administered prior to treatment. Patient returned to room and left supine in bed with call bell in reach and all needs met.    PT Evaluation Precautions/Restrictions Precautions Precautions: Fall Restrictions Weight Bearing Restrictions: No General   Vital SignsTherapy Vitals Temp: 97.5 F (36.4 C) Temp Source: Oral Pulse Rate: 77 Resp: 18 BP: (!) 132/52 Patient Position (if appropriate): Lying Oxygen Therapy SpO2: 95 % O2 Device: Not Delivered Pain   Home Living/Prior Functioning Home Living Available Help at Discharge: Family;Friend(s);Available PRN/intermittently;Personal care attendant Type of Home: House Home Access: Stairs to enter Entrance Stairs-Rails: None Home Layout: Two level;Able to live on main level with bedroom/bathroom Alternate Level Stairs-Number of Steps: stays on main Bathroom Shower/Tub: Multimedia programmer: Standard  Lives With: Alone Prior Function Level of Independence: Needs assistance with ADLs;Needs assistance with homemaking;Requires assistive device for independence  Able to Take Stairs?: Yes Driving: No Vocation: Retired Tax adviser Overall  Cognitive Status: Impaired/Different from baseline Arousal/Alertness: Awake/alert Attention: Sustained Sustained Attention: Appears intact Memory: Impaired Memory Impairment: Decreased recall of new information;Decreased short term memory Decreased Short Term Memory: Verbal basic Awareness: Impaired Awareness Impairment: Anticipatory impairment Problem Solving: Impaired Problem Solving Impairment: Functional basic Safety/Judgment: Appears intact Sensation Sensation Light Touch: Impaired Detail Light Touch Impaired Details: Impaired RLE;Impaired LLE (increased sensitivity to  touch in BLE L>R) Coordination Gross Motor Movements are Fluid and Coordinated: No Fine Motor Movements are Fluid and Coordinated: No Motor  Motor Motor: Hemiplegia Motor - Skilled Clinical Observations: Mild weakness  Mobility Bed Mobility Bed Mobility: Rolling Right;Rolling Left;Supine to Sit;Sit to Supine Rolling Right: 4: Min assist Rolling Right Details: Verbal cues for technique Rolling Left: 4: Min assist Rolling Left Details: Verbal cues for technique Supine to Sit: 3: Mod assist Supine to Sit Details: Verbal cues for technique;Manual facilitation for placement Sit to Supine: 3: Mod assist Sit to Supine - Details: Verbal cues for technique;Manual facilitation for placement Transfers Transfers: Yes Sit to Stand: 3: Mod assist Sit to Stand Details: Verbal cues for technique;Verbal cues for precautions/safety;Manual facilitation for weight bearing Stand to Sit: 3: Mod assist Stand to Sit Details (indicate cue type and reason): Verbal cues for technique;Verbal cues for precautions/safety Stand Pivot Transfers: 3: Mod assist Stand Pivot Transfer Details: Verbal cues for technique Locomotion  Ambulation Ambulation: Yes Ambulation/Gait Assistance: 4: Min assist Ambulation Distance (Feet): 50 Feet Assistive device: Rolling walker Ambulation/Gait Assistance Details: Verbal cues for  precautions/safety;Verbal cues for gait pattern;Verbal cues for safe use of DME/AE Gait Gait: Yes Gait Pattern: Impaired Gait Pattern: Trunk flexed;Wide base of support;Decreased step length - right;Decreased step length - left;Step-to pattern Stairs / Additional Locomotion Stairs: No Wheelchair Mobility Wheelchair Mobility: No  Trunk/Postural Assessment  Cervical Assessment Cervical Assessment: Exceptions to [facility] (Cervical protraction) Thoracic Assessment Thoracic Assessment: Exceptions to [facility] (increased kyphosis) Lumbar Assessment Lumbar Assessment: Exceptions to [facility] (posterior pelvic tilt and flat back) Postural Control Postural Control: Deficits on evaluation (delayed in standing)  Balance Dynamic Sitting Balance Dynamic Sitting - Balance Support: No upper extremity supported Dynamic Sitting - Level of Assistance: 5: Stand by assistance Dynamic Sitting - Balance Activities: Lateral lean/weight shifting;Forward lean/weight shifting;Reaching for objects Dynamic Standing Balance Dynamic Standing - Balance Support: No upper extremity supported Dynamic Standing - Level of Assistance: 4: Min assist Extremity Assessment      RLE Assessment RLE Assessment: Within Functional Limits LLE Assessment LLE Assessment: Within Functional Limits   See Function Navigator for Current Functional Status.   Refer to Care Plan for Long Term Goals  Recommendations for other services: Therapeutic Recreation  Pet therapy  Discharge Criteria: Patient will be discharged from PT if patient refuses treatment 3 consecutive times without medical reason, if treatment goals not met, if there is a change in medical status, if patient makes no progress towards goals or if patient is discharged from hospital.  The above assessment, treatment plan, treatment alternatives and goals were discussed and mutually agreed upon: by patient  Lorie Phenix 05/02/2016, 5:25 AM

## 2016-05-01 NOTE — Progress Notes (Signed)
Subjective/Complaints:  No issues this morning. Has chronic issues with headache, she has had headache for 20 years. She does feel like her headaches feel little different now. They start in the forehead coach at the top of the head. In addition, patient has chronic issues with her esophagus. She states that it comes and goes.  Objective: Vital Signs: Blood pressure (!) 140/47, pulse 81, temperature 97.5 F (36.4 C), temperature source Oral, resp. rate 18, height 5' (1.524 m), weight 74.2 kg (163 lb 9.6 oz), SpO2 94 %. No results found. Results for orders placed or performed during the hospital encounter of 04/30/16 (from the past 72 hour(s))  CBC     Status: None   Collection Time: 04/30/16  6:56 PM  Result Value Ref Range   WBC 6.4 4.0 - 10.5 K/uL   RBC 4.10 3.87 - 5.11 MIL/uL   Hemoglobin 12.3 12.0 - 15.0 g/dL   HCT 38.2 36.0 - 46.0 %   MCV 93.2 78.0 - 100.0 fL   MCH 30.0 26.0 - 34.0 pg   MCHC 32.2 30.0 - 36.0 g/dL   RDW 13.9 11.5 - 15.5 %   Platelets 311 150 - 400 K/uL  Creatinine, serum     Status: None   Collection Time: 04/30/16  6:56 PM  Result Value Ref Range   Creatinine, Ser 0.68 0.44 - 1.00 mg/dL   GFR calc non Af Amer >60 >60 mL/min   GFR calc Af Amer >60 >60 mL/min    Comment: (NOTE) The eGFR has been calculated using the CKD EPI equation. This calculation has not been validated in all clinical situations. eGFR's persistently <60 mL/min signify possible Chronic Kidney Disease.       General: No acute distress Mood and affect are appropriate Heart: Regular rate and rhythm no rubs murmurs or extra sounds Lungs: Clear to auscultation, breathing unlabored, no rales or wheezes Abdomen: Positive bowel sounds, soft nontender to palpation, nondistended Extremities: No clubbing, cyanosis, or edema Skin: No evidence of breakdown, no evidence of rash Neurologic: Cranial nerves II through XII intact, motor strength is 4/5 in bilateral deltoid, bicep, tricep, grip,Right   hip flexor, knee extensors, ankle dorsiflexor and plantar flexor 3- in LLE, hip flexor, knee extensor, ankle dorsiflexor and plantar flexor Sensory exam normal sensation to light touch bilateral upper and lower extremities Cerebellar exam normal finger to nose to finger as well as heel to shin in bilateral upper and lower extremities Musculoskeletal: Full range of motion in all 4 extremities. No joint swelling   Assessment/Plan: 1. Functional deficits secondary to Left frontal, left corona radiata infarct which require 3+ hours per day of interdisciplinary therapy in a comprehensive inpatient rehab setting. Physiatrist is providing close team supervision and 24 hour management of active medical problems listed below. Physiatrist and rehab team continue to assess barriers to discharge/monitor patient progress toward functional and medical goals. FIM:          Function - Air cabin crew transfer activity did not occur: N/A  Function - Chair/bed transfer Chair/bed transfer activity did not occur: N/A     Function - Comprehension Comprehension: Auditory Comprehension assist level: Understands basic 25 - 49% of the time/ requires cueing 50 - 75% of the time  Function - Expression Expression: Verbal Expression assist level: Expresses basic 25 - 49% of the time/requires cueing 50 - 75% of the time. Uses single words/gestures.  Function - Social Interaction Social Interaction assist level: Interacts appropriately 25 - 49% of time - Needs  frequent redirection.  Function - Problem Solving Problem solving assist level: Solves basic 25 - 49% of the time - needs direction more than half the time to initiate, plan or complete simple activities  Function - Memory Memory assist level: Recognizes or recalls 25 - 49% of the time/requires cueing 50 - 75% of the time Patient normally able to recall (first 3 days only): That he or she is in a hospital, Current  season    Charlett Blake 05/01/2016, 11:10 AM    Medical Problem List and Plan: 1. Gait disorder/apraxia with dysphagia/dysarthriasecondary to left frontal/corona radiata infarct secondary to small vessel disease as well as history of CVA in the past with left side mild residual weakness -CIR PT, OT, speech                                      2 Anticoagulation: Subcutaneous Lovenox. Monitor platelet counts and any signs of bleeding 3. Pain Management/hx of chronic low back pain - Tylenol not helping for headache. We will trial tramadol, consider topiramate 4. Mood: Xanax 0.5 mg twice a day, Pamelor 25 mg daily at bedtime, Trilafon 2 mg daily at bedtime, Halcion 0.125 mg bedtime as needed 5. Neuropsych: This patient iscapable of making decisions on herown behalf. 6. Skin/Wound Care: Routine skin checks 7. Fluids/Electrolytes/Nutrition: Routine I&O with follow-up chemistries 8.Dysphagia. Dysphagia #1 nectar liquids. Monitor hydration. Follow-up speech therapy -has hx of esophageal stricture, there also may be esophageal spasm. Given the patient states that this is a problem that comes and goes. Will try aromatherapy with menthol, ordered [place], for this She is supposed to be on chronic protonix . This has been ordered 9.Parkinson's disease. Sinemet 25-1003 times a day -monitor PD symptoms with therapy/activity and address as indicated.  10.Hyperlipidemia. Pravachol FACE to [facility] visit

## 2016-05-02 ENCOUNTER — Inpatient Hospital Stay (HOSPITAL_COMMUNITY): Payer: Medicare Other

## 2016-05-02 MED ORDER — TOPIRAMATE 25 MG PO TABS
25.0000 mg | ORAL_TABLET | Freq: Two times a day (BID) | ORAL | Status: DC
  Administered 2016-05-02 – 2016-05-13 (×23): 25 mg via ORAL
  Filled 2016-05-02 (×23): qty 1

## 2016-05-02 NOTE — Progress Notes (Signed)
Subjective/Complaints:  Nausea improved. Yesterday after IV Zofran. Had some IV fluids overnight due to poor intake yesterday. In addition, patient complains of headaches. She has a history of chronic headaches but her headaches seem to be worse after her stroke. She has tried acetaminophen which is not helpful, also tried tramadol which was not helpful. At home she got relief from ibuprofen. However, given recent stroke, this is not advisable.  Objective: Vital Signs: Blood pressure (!) 141/56, pulse 80, temperature 97.5 F (36.4 C), temperature source Oral, resp. rate 17, height 5' (1.524 m), weight 74.2 kg (163 lb 9.6 oz), SpO2 97 %. No results found. Results for orders placed or performed during the hospital encounter of 04/30/16 (from the past 72 hour(s))  CBC     Status: None   Collection Time: 04/30/16  6:56 PM  Result Value Ref Range   WBC 6.4 4.0 - 10.5 K/uL   RBC 4.10 3.87 - 5.11 MIL/uL   Hemoglobin 12.3 12.0 - 15.0 g/dL   HCT 38.2 36.0 - 46.0 %   MCV 93.2 78.0 - 100.0 fL   MCH 30.0 26.0 - 34.0 pg   MCHC 32.2 30.0 - 36.0 g/dL   RDW 13.9 11.5 - 15.5 %   Platelets 311 150 - 400 K/uL  Creatinine, serum     Status: None   Collection Time: 04/30/16  6:56 PM  Result Value Ref Range   Creatinine, Ser 0.68 0.44 - 1.00 mg/dL   GFR calc non Af Amer >60 >60 mL/min   GFR calc Af Amer >60 >60 mL/min    Comment: (NOTE) The eGFR has been calculated using the CKD EPI equation. This calculation has not been validated in all clinical situations. eGFR's persistently <60 mL/min signify possible Chronic Kidney Disease.       General: No acute distress Mood and affect are appropriate Heart: Regular rate and rhythm no rubs murmurs or extra sounds Lungs: Clear to auscultation, breathing unlabored, no rales or wheezes Abdomen: Positive bowel sounds, soft nontender to palpation, nondistended Extremities: No clubbing, cyanosis, or edema Skin: No evidence of breakdown, no evidence of  rash Neurologic: Cranial nerves II through XII intact, motor strength is 4/5 in bilateral deltoid, bicep, tricep, grip,Right  hip flexor, knee extensors, ankle dorsiflexor and plantar flexor 3- in LLE, hip flexor, knee extensor, ankle dorsiflexor and plantar flexor Sensory exam normal sensation to light touch bilateral upper and lower extremities Cerebellar exam normal finger to nose to finger as well as heel to shin in bilateral upper and lower extremities Musculoskeletal: Full range of motion in all 4 extremities. No joint swelling   Assessment/Plan: 1. Functional deficits secondary to Left frontal, left corona radiata infarct which require 3+ hours per day of interdisciplinary therapy in a comprehensive inpatient rehab setting. Physiatrist is providing close team supervision and 24 hour management of active medical problems listed below. Physiatrist and rehab team continue to assess barriers to discharge/monitor patient progress toward functional and medical goals. FIM: Function - Bathing Bathing activity did not occur: Refused  Function- Upper Body Dressing/Undressing Upper body dressing/undressing activity did not occur: Refused Function - Lower Body Dressing/Undressing Lower body dressing/undressing activity did not occur: Refused  Function - Toileting Toileting activity did not occur: No continent bowel/bladder event Toileting steps completed by patient: Adjust clothing prior to toileting Toileting steps completed by helper: Performs perineal hygiene, Adjust clothing after toileting Assist level: Touching or steadying assistance (Pt.75%)  Function - Air cabin crew transfer activity did not occur: N/A Toilet  transfer assistive device: Elevated toilet seat/BSC over toilet Assist level to toilet: Moderate assist (Pt 50 - 74%/lift or lower) Assist level from toilet: Moderate assist (Pt 50 - 74%/lift or lower)  Function - Chair/bed transfer Chair/bed transfer activity did  not occur: N/A Chair/bed transfer method: Stand pivot Chair/bed transfer assist level: Moderate assist (Pt 50 - 74%/lift or lower) Chair/bed transfer assistive device: Walker  Function - Locomotion: Ambulation Assistive device: Walker-rolling Max distance: 15' Assist level: Touching or steadying assistance (Pt > 75%) Assist level: Touching or steadying assistance (Pt > 75%) Assist level: Touching or steadying assistance (Pt > 75%)  Function - Comprehension Comprehension: Auditory Comprehension assist level: Understands complex 90% of the time/cues 10% of the time  Function - Expression Expression: Verbal Expression assist level: Expresses basic 75 - 89% of the time/requires cueing 10 - 24% of the time. Needs helper to occlude trach/needs to repeat words.  Function - Social Interaction Social Interaction assist level: Interacts appropriately 90% of the time - Needs monitoring or encouragement for participation or interaction.  Function - Problem Solving Problem solving assist level: Solves basic 90% of the time/requires cueing < 10% of the time  Function - Memory Memory assist level: Recognizes or recalls 50 - 74% of the time/requires cueing 25 - 49% of the time Patient normally able to recall (first 3 days only): Current season, Staff names and faces, That he or she is in a hospital    [place] E 05/02/2016, 11:06 AM    Medical Problem List and Plan: 1. Gait disorder/apraxia with dysphagia/dysarthriasecondary to left frontal/corona radiata infarct secondary to small vessel disease as well as history of CVA in the past with left side mild residual weakness -CIR PT, OT, speech                                      2 Anticoagulation: Subcutaneous Lovenox. Monitor platelet counts and any signs of bleeding 3. Pain Management/hx of chronic low back pain - Tylenol, tramadol, not helping for headache, trial topiramate 4. Mood: Xanax 0.5 mg twice a day,  Pamelor 25 mg daily at bedtime, Trilafon 2 mg daily at bedtime, Halcion 0.125 mg bedtime as needed, monitor for oversedation with the addition of topiramate 5. Neuropsych: This patient iscapable of making decisions on herown behalf. 6. Skin/Wound Care: Routine skin checks 7. Fluids/Electrolytes/Nutrition: Routine I&O with follow-up chemistries 8.Dysphagia. Dysphagia #1 nectar liquids. Monitor hydration. Follow-up speech therapy -has hx of esophageal stricture,She is supposed to be on chronic protonix . This has been ordered 9.Parkinson's disease. Sinemet 25-1003 times a day -monitor PD symptoms with therapy/activity and address as indicated.  10.Hyperlipidemia. Pravachol FACE to [facility] visit performed today

## 2016-05-02 NOTE — Progress Notes (Signed)
Occupational Therapy Session Note  Patient Details  Name: Sarah [name] MRN: [id number] Date of Birth: 07-16-1926  Today's Date: 05/02/2016 OT Individual Time: 0900-1000 OT Individual Time Calculation (min): 60 min   Short Term Goals: Week 1:  OT Short Term Goal 1 (Week 1): Pt will perform toileting with min A for balance during clothing management and hygiene. OT Short Term Goal 2 (Week 1): Pt will perform shower transfer with min A and use of DME as needed. OT Short Term Goal 3 (Week 1): Pt will engage in 10 minutes of functional tasks with 2 or less rest breaks.  OT Short Term Goal 4 (Week 1): Pt will perform LB dressing with mod A in order to increase I with LB self care.   Skilled Therapeutic Interventions/Progress Updates: ADL-retraining with focus on improved activity tolerance, bed level BADL and bed mobility, positioning, improved awareness and attention and pain management (for nausea).   Pt received supine in bed with emesis bag present and reporting ongoing nausea.  Pt refused transfer or bathing at shower level or by sink.   OT then noted that pt was sipping on orange juice and coffee but could not manage this well d/t poor positioning in bed.   OT educated pt on food choices less irritating to her stomach and provided pt with pureed fruit, magic cup, ginger ale, and hot tea as better options.   Pt required setup and hand guidance for self-feeding 50% of the time but consumed all items to her satisfaction (90% of magic cup and 50% of pureed fruit).   Pt was then assisted with bathing at bed level but reported fatigue as preventing more active participation after OT provided total assist to wash her lower legs.  Pt left in bed at end of session with all needs placed within reach.  Therapy Documentation Precautions:  Precautions Precautions: Fall Restrictions Weight Bearing Restrictions: No   Vital Signs: Therapy Vitals Pulse Rate: 80 Resp: 17 BP: (!) 141/56 Patient Position  (if appropriate): Lying Oxygen Therapy SpO2: 97 % O2 Device: Not Delivered   Pain: Pain Assessment Pain Assessment: 0-10 Pain Score: 2  Pain Location: Head Pain Orientation: Anterior Pain Frequency: Constant Pain Onset: On-going Patients Stated Pain Goal: 3 Pain Intervention(s): Medication (See eMAR)   ADL: No/denies pain; pt endorses continued nausea  See Function Navigator for Current Functional Status.   Therapy/Group: Individual Therapy  [place] 05/02/2016, 12:18 PM

## 2016-05-02 NOTE — Progress Notes (Signed)
Up to bathroom to void. Passing flatus however no BM since 2/1. Pt noted nausea, flushed face, reports going to be sick when she sits up and attempts walking to bathroom. Vomited approx 50 cc orange food substance at 1530 after eating luch and drinking gingerale/belching. This time pt noted sick on stomach after sitting up to walk and vomited ~25cc or brown/orange fluid. Toileted and assisted back to bed. Zofran already given prior to lunch. Sarah [name]

## 2016-05-03 ENCOUNTER — Inpatient Hospital Stay (HOSPITAL_COMMUNITY): Payer: Medicare Other | Admitting: Occupational Therapy

## 2016-05-03 ENCOUNTER — Inpatient Hospital Stay (HOSPITAL_COMMUNITY): Payer: Medicare Other | Admitting: Speech Pathology

## 2016-05-03 ENCOUNTER — Inpatient Hospital Stay (HOSPITAL_COMMUNITY): Payer: Medicare Other

## 2016-05-03 ENCOUNTER — Telehealth: Payer: Self-pay | Admitting: Neurology

## 2016-05-03 DIAGNOSIS — K5901 Slow transit constipation: Secondary | ICD-10-CM

## 2016-05-03 DIAGNOSIS — G441 Vascular headache, not elsewhere classified: Secondary | ICD-10-CM

## 2016-05-03 DIAGNOSIS — R112 Nausea with vomiting, unspecified: Secondary | ICD-10-CM

## 2016-05-03 LAB — CBC WITH DIFFERENTIAL/PLATELET
BASOS PCT: 0 %
Basophils Absolute: 0 10*3/uL (ref 0.0–0.1)
EOS ABS: 0.2 10*3/uL (ref 0.0–0.7)
Eosinophils Relative: 4 %
HEMATOCRIT: 38.1 % (ref 36.0–46.0)
HEMOGLOBIN: 12.2 g/dL (ref 12.0–15.0)
LYMPHS ABS: 1.5 10*3/uL (ref 0.7–4.0)
Lymphocytes Relative: 30 %
MCH: 29.8 pg (ref 26.0–34.0)
MCHC: 32 g/dL (ref 30.0–36.0)
MCV: 93.2 fL (ref 78.0–100.0)
Monocytes Absolute: 0.6 10*3/uL (ref 0.1–1.0)
Monocytes Relative: 11 %
NEUTROS ABS: 2.8 10*3/uL (ref 1.7–7.7)
NEUTROS PCT: 55 %
Platelets: 306 10*3/uL (ref 150–400)
RBC: 4.09 MIL/uL (ref 3.87–5.11)
RDW: 13.6 % (ref 11.5–15.5)
WBC: 5 10*3/uL (ref 4.0–10.5)

## 2016-05-03 LAB — COMPREHENSIVE METABOLIC PANEL
ALBUMIN: 3.2 g/dL — AB (ref 3.5–5.0)
ALK PHOS: 42 U/L (ref 38–126)
ALT: 8 U/L — ABNORMAL LOW (ref 14–54)
ANION GAP: 6 (ref 5–15)
AST: 13 U/L — ABNORMAL LOW (ref 15–41)
BILIRUBIN TOTAL: 0.5 mg/dL (ref 0.3–1.2)
BUN: 9 mg/dL (ref 6–20)
CALCIUM: 8.8 mg/dL — AB (ref 8.9–10.3)
CO2: 24 mmol/L (ref 22–32)
Chloride: 103 mmol/L (ref 101–111)
Creatinine, Ser: 0.72 mg/dL (ref 0.44–1.00)
GFR calc Af Amer: >60 mL/min (ref >60)
GFR calc non Af Amer: >60 mL/min (ref >60)
Glucose, Bld: 99 mg/dL (ref 65–99)
POTASSIUM: 3.9 mmol/L (ref 3.5–5.1)
Sodium: 133 mmol/L — ABNORMAL LOW (ref 135–145)
TOTAL PROTEIN: 5.9 g/dL — AB (ref 6.5–8.1)

## 2016-05-03 MED ORDER — ONDANSETRON HCL 4 MG/2ML IJ SOLN
4.0000 mg | Freq: Four times a day (QID) | INTRAMUSCULAR | Status: DC | PRN
  Administered 2016-05-04: 4 mg via INTRAVENOUS
  Filled 2016-05-03: qty 2

## 2016-05-03 MED ORDER — SENNOSIDES-DOCUSATE SODIUM 8.6-50 MG PO TABS
1.0000 | ORAL_TABLET | Freq: Every day | ORAL | Status: DC
  Administered 2016-05-03: 1 via ORAL
  Filled 2016-05-03: qty 1

## 2016-05-03 MED ORDER — ONDANSETRON HCL 4 MG PO TABS
8.0000 mg | ORAL_TABLET | Freq: Four times a day (QID) | ORAL | Status: DC | PRN
  Administered 2016-05-03 – 2016-05-04 (×3): 8 mg via ORAL
  Filled 2016-05-03 (×3): qty 2

## 2016-05-03 MED ORDER — PRO-STAT SUGAR FREE PO LIQD
30.0000 mL | Freq: Two times a day (BID) | ORAL | Status: DC
  Administered 2016-05-03 – 2016-05-05 (×3): 30 mL via ORAL
  Filled 2016-05-03 (×17): qty 30

## 2016-05-03 MED ORDER — BISACODYL 10 MG RE SUPP
10.0000 mg | Freq: Once | RECTAL | Status: AC
  Administered 2016-05-03: 10 mg via RECTAL
  Filled 2016-05-03: qty 1

## 2016-05-03 MED ORDER — POLYETHYLENE GLYCOL 3350 17 G PO PACK
17.0000 g | PACK | Freq: Every day | ORAL | Status: DC
  Administered 2016-05-03 – 2016-05-10 (×6): 17 g via ORAL
  Filled 2016-05-03 (×8): qty 1

## 2016-05-03 NOTE — Progress Notes (Signed)
Subjective/Complaints: Pt seen laying in bed this AM about to work with OT.  She states she slept fair and had a terrible weekend, due to nausea > headache.    ROS: +Headache, Nausea. Denies CP, SOB, N/V/D.  Objective: Vital Signs: Blood pressure 130/62, pulse 80, temperature 97.8 F (36.6 C), temperature source Oral, resp. rate 18, height 5' (1.524 m), weight 74.2 kg (163 lb 9.6 oz), SpO2 98 %. No results found. Results for orders placed or performed during the hospital encounter of 04/30/16 (from the past 72 hour(s))  CBC     Status: None   Collection Time: 04/30/16  6:56 PM  Result Value Ref Range   WBC 6.4 4.0 - 10.5 K/uL   RBC 4.10 3.87 - 5.11 MIL/uL   Hemoglobin 12.3 12.0 - 15.0 g/dL   HCT 38.2 36.0 - 46.0 %   MCV 93.2 78.0 - 100.0 fL   MCH 30.0 26.0 - 34.0 pg   MCHC 32.2 30.0 - 36.0 g/dL   RDW 13.9 11.5 - 15.5 %   Platelets 311 150 - 400 K/uL  Creatinine, serum     Status: None   Collection Time: 04/30/16  6:56 PM  Result Value Ref Range   Creatinine, Ser 0.68 0.44 - 1.00 mg/dL   GFR calc non Af Amer >60 >60 mL/min   GFR calc Af Amer >60 >60 mL/min    Comment: (NOTE) The eGFR has been calculated using the CKD EPI equation. This calculation has not been validated in all clinical situations. eGFR's persistently <60 mL/min signify possible Chronic Kidney Disease.   CBC WITH DIFFERENTIAL     Status: None   Collection Time: 05/03/16  6:39 AM  Result Value Ref Range   WBC 5.0 4.0 - 10.5 K/uL   RBC 4.09 3.87 - 5.11 MIL/uL   Hemoglobin 12.2 12.0 - 15.0 g/dL   HCT 38.1 36.0 - 46.0 %   MCV 93.2 78.0 - 100.0 fL   MCH 29.8 26.0 - 34.0 pg   MCHC 32.0 30.0 - 36.0 g/dL   RDW 13.6 11.5 - 15.5 %   Platelets 306 150 - 400 K/uL   Neutrophils Relative % 55 %   Neutro Abs 2.8 1.7 - 7.7 K/uL   Lymphocytes Relative 30 %   Lymphs Abs 1.5 0.7 - 4.0 K/uL   Monocytes Relative 11 %   Monocytes Absolute 0.6 0.1 - 1.0 K/uL   Eosinophils Relative 4 %   Eosinophils Absolute 0.2 0.0 - 0.7  K/uL   Basophils Relative 0 %   Basophils Absolute 0.0 0.0 - 0.1 K/uL  Comprehensive metabolic panel     Status: Abnormal   Collection Time: 05/03/16  6:39 AM  Result Value Ref Range   Sodium 133 (L) 135 - 145 mmol/L   Potassium 3.9 3.5 - 5.1 mmol/L   Chloride 103 101 - 111 mmol/L   CO2 24 22 - 32 mmol/L   Glucose, Bld 99 65 - 99 mg/dL   BUN 9 6 - 20 mg/dL   Creatinine, Ser 0.72 0.44 - 1.00 mg/dL   Calcium 8.8 (L) 8.9 - 10.3 mg/dL   Total Protein 5.9 (L) 6.5 - 8.1 g/dL   Albumin 3.2 (L) 3.5 - 5.0 g/dL   AST 13 (L) 15 - 41 U/L   ALT 8 (L) 14 - 54 U/L   Alkaline Phosphatase 42 38 - 126 U/L   Total Bilirubin 0.5 0.3 - 1.2 mg/dL   GFR calc non Af Amer >60 >60 mL/min  GFR calc Af Amer >60 >60 mL/min    Comment: (NOTE) The eGFR has been calculated using the CKD EPI equation. This calculation has not been validated in all clinical situations. eGFR's persistently <60 mL/min signify possible Chronic Kidney Disease.    Anion gap 6 5 - 15    General: No acute distress. Vital signs reviewed.  Heart: Regular rate and rhythm. No JVD. Lungs: Clear to auscultation, breathing unlabored. Abdomen: Positive bowel sounds, soft Musculoskeletal: No edema. No tenderness Neurologic:  Motor: 4+/5 in bilateral deltoid, bicep, tricep, grip, right hip flexor, knee extensors, ankle dorsiflexor and plantar flexor 4+/5 in LLE, hip flexor, knee extensor, ankle dorsiflexor and plantar flexor with some apraxia  B/l LE slightly limited below knees due to neuropathy Sensation: Hyperalgesia distal b/l knees Skin: No evidence of breakdown, no evidence of rash Psych: Mood and affect are appropriate  Assessment/Plan: 1. Functional deficits secondary to Left frontal, left corona radiata infarct which require 3+ hours per day of interdisciplinary therapy in a comprehensive inpatient rehab setting. Physiatrist is providing close team supervision and 24 hour management of active medical problems listed  below. Physiatrist and rehab team continue to assess barriers to discharge/monitor patient progress toward functional and medical goals. FIM: Function - Bathing Bathing activity did not occur: Refused  Function- Upper Body Dressing/Undressing Upper body dressing/undressing activity did not occur: Refused Function - Lower Body Dressing/Undressing Lower body dressing/undressing activity did not occur: Refused  Function - Toileting Toileting activity did not occur: No continent bowel/bladder event Toileting steps completed by patient: Adjust clothing prior to toileting Toileting steps completed by helper: Performs perineal hygiene, Adjust clothing after toileting Assist level: Touching or steadying assistance (Pt.75%)  Function - Air cabin crew transfer activity did not occur: N/A Toilet transfer assistive device: Elevated toilet seat/BSC over toilet Assist level to toilet: Touching or steadying assistance (Pt > 75%) Assist level from toilet: Moderate assist (Pt 50 - 74%/lift or lower)  Function - Chair/bed transfer Chair/bed transfer activity did not occur: N/A Chair/bed transfer method: Stand pivot Chair/bed transfer assist level: Moderate assist (Pt 50 - 74%/lift or lower) Chair/bed transfer assistive device: Walker  Function - Locomotion: Ambulation Assistive device: Walker-rolling Max distance: 15' Assist level: Touching or steadying assistance (Pt > 75%) Assist level: Touching or steadying assistance (Pt > 75%) Assist level: Touching or steadying assistance (Pt > 75%)  Function - Comprehension Comprehension: Auditory Comprehension assist level: Understands complex 90% of the time/cues 10% of the time  Function - Expression Expression: Verbal Expression assist level: Expresses basic 75 - 89% of the time/requires cueing 10 - 24% of the time. Needs helper to occlude trach/needs to repeat words.  Function - Social Interaction Social Interaction assist level:  Interacts appropriately 90% of the time - Needs monitoring or encouragement for participation or interaction.  Function - Problem Solving Problem solving assist level: Solves basic 90% of the time/requires cueing < 10% of the time  Function - Memory Memory assist level: Recognizes or recalls 50 - 74% of the time/requires cueing 25 - 49% of the time Patient normally able to recall (first 3 days only): Current season, Location of own room, Staff names and faces, That he or she is in a hospital    [place] 05/03/2016, 9:01 AM    Medical Problem List and Plan: 1. Gait disorder/apraxia with dysphagia/dysarthriasecondary to left frontal/corona radiata infarct secondary to small vessel disease as well as history of CVA in the past with left side mild residual weakness  Cont CIR  Weekend notes and history reviewed, discussed with OT.   2. Anticoagulation: Subcutaneous Lovenox. Monitor platelet counts and any signs of bleeding 3. Pain Management/hx of chronic low back pain - Tylenol, tramadol, not helping for headache, trial topiramate - started 2/4, monitor 4. Mood: Xanax 0.5 mg twice a day, Pamelor 25 mg daily at bedtime, Trilafon 2 mg daily at bedtime, Halcion 0.125 mg bedtime as needed, monitor for oversedation with the addition of topiramate 5. Neuropsych: This patient iscapable of making decisions on herown behalf. 6. Skin/Wound Care: Routine skin checks 7. Fluids/Electrolytes/Nutrition: Routine I&Os 8.Dysphagia. Dysphagia #1 thin liquids. Monitor hydration. Follow-up speech therapy Has hx of esophageal stricture  Cont protonix 9.Parkinson's disease. Sinemet 25-1003 times a day -monitor PD symptoms with therapy/activity and address as indicated.  10.Hyperlipidemia. Pravachol 11. Hypoalbuminemia  Supplement initiated 2/5 12. Severe Nausea  Limitting therapies and oral intake  Zofran increased to 3m q6 PRN  Will cont to monitor 13.  Constipation  May also be contributing to nausea  Increased reg on 2/5  [facility]

## 2016-05-03 NOTE — Progress Notes (Signed)
Speech Language Pathology Daily Session Note  Patient Details  Name: Sarah [name] MRN: [id number] Date of Birth: [birth year]  Today's Date: 05/03/2016 SLP Individual Time: [id number] SLP Individual Time Calculation (min): 40 min  Short Term Goals: Week 1: SLP Short Term Goal 1 (Week 1): Patient will consume trials of Dys.2 textures, with Mod I recall to alternate solids and liquids to effectively prevent s/s of regurgitation.  SLP Short Term Goal 2 (Week 1): Patient will utilize word finding strategies in strucutred tasks with Mod assist question cues.  SLP Short Term Goal 3 (Week 1): Patient will utilize comepsatory strategies for short term recall of new and daily information with Mod assist question cues.   Skilled Therapeutic Interventions: Skilled treatment session focused on dysphagia and word-finding goals. SLP facilitated session by providing skilled observation with lunch meal of Dys. 1 textures with thin liquids. Patient consumed meal with overt cough X 1, suspect due to oral holding and required Min A verbal cues for use of swallowing compensatory strategies in regards to alternating solids/liquids. Patient participated in a functional conversation while consuming lunch meal and was Mod I for word-finding. Patient left upright in bed with caregiver present. Continue with current plan of care.      Function:  Eating Eating   Modified Consistency Diet: Yes Eating Assist Level: Helper checks for pocketed food;Supervision or verbal cues;Set up assist for   Eating Set Up Assist For: Opening containers       Cognition Comprehension Comprehension assist level: Understands complex 90% of the time/cues 10% of the time  Expression   Expression assist level: Expresses basic 75 - 89% of the time/requires cueing 10 - 24% of the time. Needs helper to occlude trach/needs to repeat words.  Social Interaction Social Interaction assist level: Interacts appropriately 90% of the time - Needs  monitoring or encouragement for participation or interaction.  Problem Solving Problem solving assist level: Solves basic 90% of the time/requires cueing < 10% of the time  Memory Memory assist level: Recognizes or recalls 50 - 74% of the time/requires cueing 25 - 49% of the time    Pain Reported headache at end of session. Patient premedicated, RN aware.   Therapy/Group: Individual Therapy  Larry Alcock 05/03/2016, 4:00 PM

## 2016-05-03 NOTE — Progress Notes (Signed)
Patient information reviewed and entered into eRehab system by Caedan Sumler, RN, CRRN, PPS Coordinator.  Information including medical coding and functional independence measure will be reviewed and updated through discharge.     Per nursing patient was given "Data Collection Information Summary for Patients in Inpatient Rehabilitation Facilities with attached "Privacy Act Statement-Health Care Records" upon admission.  

## 2016-05-03 NOTE — Progress Notes (Signed)
Occupational Therapy Session Note  Patient Details  Name: Sarah [name] MRN: [id number] Date of Birth: February 11, 1927  Today's Date: 05/03/2016 OT Individual Time: [id number] OT Individual Time Calculation (min): 45 min  and Today's Date: 05/03/2016 OT Missed Time: 15 Minutes Missed Time Reason: Patient unwilling/refused to participate without medical reason   Short Term Goals: Week 1:  OT Short Term Goal 1 (Week 1): Pt will perform toileting with min A for balance during clothing management and hygiene. OT Short Term Goal 2 (Week 1): Pt will perform shower transfer with min A and use of DME as needed. OT Short Term Goal 3 (Week 1): Pt will engage in 10 minutes of functional tasks with 2 or less rest breaks.  OT Short Term Goal 4 (Week 1): Pt will perform LB dressing with mod A in order to increase I with LB self care.   Skilled Therapeutic Interventions/Progress Updates:    Upon entering the room, pt supine in bed with c/o nausea and headache. Pt reports recently receiving medication prior to OT arrival. Pt declined OOB activities this session secondary to feeling unwell. Pt required coaxing and increased time for participation. Pt taking over 20 minutes to get to EOB with mod A . Pt seated on EOB to drink decaffeinated coffee and pt discussing today's schedule. Pt stating she didn't think she could do therapies today. OT providing education regarding purpose of inpatient rehab and positive attitude of attempting each session. Pt verbalized understanding and requesting to return to supine secondary to feeling unwell.  15 missed minutes of OT intervention. Pt supine in bed with call bell and all needed items within reach upon exiting the room.   Therapy Documentation Precautions:  Precautions Precautions: Fall Restrictions Weight Bearing Restrictions: No General: General OT Amount of Missed Time: 15 Minutes Vital Signs: Therapy Vitals Temp Source: Oral Pulse Rate: 80 Resp: 18 BP:  130/62 Patient Position (if appropriate): Lying Oxygen Therapy SpO2: 98 % O2 Device: Not Delivered Pain:   ADL:   Exercises:   Other Treatments:    See Function Navigator for Current Functional Status.   Therapy/Group: Individual Therapy  Gypsy Decant 05/03/2016, 7:58 AM

## 2016-05-03 NOTE — Telephone Encounter (Signed)
Patient daughter Sarah [name] called and states that patient had a stroke on Tuesday and was admitted to [facility]. She was moved down to 4 floor rehab this weekend. Patient daughter phone is [phone]

## 2016-05-03 NOTE — Progress Notes (Signed)
Physical Therapy Session Note  Patient Details  Name: Sarah [name] MRN: [id number] Date of Birth: [birth year]  Today's Date: 05/03/2016 PT Individual Time: 0830-0930 PT Individual Time Calculation (min): 60 min   Short Term Goals: Week 1:  PT Short Term Goal 1 (Week 1): Patient will consistently perform transfers with min assist  PT Short Term Goal 2 (Week 1): Patient will initiate stait training PT Short Term Goal 3 (Week 1): Patient will ambulate 33ft with min assist and Rw  PT Short Term Goal 4 (Week 1): Patient will perform bed mobility with Min assist  PT Short Term Goal 5 (Week 1): Paitent will propell WC 144ft with supervision assist.   Skilled Therapeutic Interventions/Progress Updates:   Min assist to come to EOB to address balance and upright tolerance while self-feeding breakfast following swallowing precautions. Engaged in discussion in regards to d/c planning and PLOF with assistance from hired caregiver during breakfast. Gait training in room with pt's personal rollator at overall min assist level for home environment mobility training. Transfers with cues for hand placement and min assist overall. Pt declined sitting up in recliner or w/c and request to return to bed. Completed supine LE therex for functional strengthening including ankle pumps, heel slides, hip abduction and SLR x 10 reps each.  Pt limited during session due to nausea and headache.   Therapy Documentation Precautions:  Precautions Precautions: Fall Restrictions Weight Bearing Restrictions: No  Pain:  c/o headache and nausea - premedicated by RN.  See Function Navigator for Current Functional Status.   Therapy/Group: Individual Therapy  Canary Brim Ivory Broad, PT, DPT  05/03/2016, 9:55 AM

## 2016-05-03 NOTE — IPOC Note (Addendum)
Overall Plan of Care [facility]) Patient Details Name: Sarah [name] MRN: [id number] DOB: 03/13/1927  Admitting Diagnosis: R CVA  Hospital Problems: Active Problems:   Acute ischemic right MCA stroke ([place])   Primary Parkinsonism ([place])   Dysphagia, post-stroke   Benign essential HTN   Small vessel disease, cerebrovascular   Vascular headache   Intractable vomiting with nausea   Slow transit constipation     Functional Problem List: Nursing Edema, Endurance, Medication Management, Nutrition, Pain, Safety, Skin Integrity  PT Balance, Behavior, Endurance, Motor, Nutrition, Pain, Perception, Safety, Sensory, Skin Integrity  OT Balance, Behavior, Cognition, Endurance, Motor, Pain, Perception, Safety  SLP Cognition, Linguistic, Nutrition  TR         Basic ADL's: OT Grooming, Bathing, Dressing, Toileting     Advanced  ADL's: OT       Transfers: PT Bed Mobility, Bed to Chair, Car, [employer], Futures trader, Metallurgist: PT Ambulation, Emergency planning/management officer, Stairs     Additional Impairments: OT Fuctional Use of Upper Extremity  SLP Swallowing, Communication, Social Cognition expression Problem Solving, Memory  TR      Anticipated Outcomes Item Anticipated Outcome  Self Feeding supervision  Swallowing  Supervision    Basic self-care  supervision - min A  Toileting  supervision   Bathroom Transfers supervision  Bowel/Bladder  Supervision  Transfers  Supervisoin assist with LRAD   Locomotion  supervision assist for household distances.   Communication  Supervision   Cognition  Supervision   Pain  <3   Safety/Judgment  Min assist   Therapy Plan: PT Intensity: Minimum of 1-2 x/day ,45 to 90 minutes PT Frequency: 5 out of 7 days PT Duration Estimated Length of Stay: 10-14 OT Intensity: Minimum of 1-2 x/day, 45 to 90 minutes OT Frequency: 5 out of 7 days OT Duration/Estimated Length of Stay: 10-14 days SLP Intensity: Minumum of 1-2 x/day, 30  to 90 minutes SLP Frequency: 3 to 5 out of 7 days SLP Duration/Estimated Length of Stay: 15-18 days       Team Interventions: Nursing Interventions Patient/Family Education, Disease Management/Prevention, Pain Management, Medication Management, Skin Care/Wound Management, Dysphagia/Aspiration Precaution Training, Discharge Planning, Psychosocial Support  PT interventions Ambulation/gait training, Training and development officer, Cognitive remediation/compensation, Community reintegration, Discharge planning, DME/adaptive equipment instruction, Functional electrical stimulation, Disease management/prevention, Functional mobility training, Neuromuscular re-education, Patient/family education, Pain management, Psychosocial support, Splinting/orthotics, UE/LE Strength taining/ROM, Stair training, Therapeutic Activities, UE/LE Coordination activities, Therapeutic Exercise, Visual/perceptual remediation/compensation, Wheelchair propulsion/positioning  OT Interventions Training and development officer, Cognitive remediation/compensation, Community reintegration, Discharge planning, Functional mobility training, Psychosocial support, Therapeutic Activities, UE/LE Coordination activities, Patient/family education, DME/adaptive equipment instruction, Pain management, UE/LE Strength taining/ROM, Wheelchair propulsion/positioning, Therapeutic Exercise, Neuromuscular re-education, Self Care/advanced ADL retraining  SLP Interventions Cognitive remediation/compensation, Cueing hierarchy, Dysphagia/aspiration precaution training, Environmental controls, Functional tasks, Medication managment, Patient/family education, Speech/Language facilitation  TR Interventions    SW/CM Interventions Discharge Planning, Psychosocial Support, Patient/Family Education    Team Discharge Planning: Destination: PT-Home ,OT- Home , SLP- (home versus SNF) Projected Follow-up: PT-Home health PT, OT-  24 hour supervision/assistance, Home health OT,  SLP-24 hour supervision/assistance (at this time) Projected Equipment Needs: PT-To be determined, OT- To be determined, SLP-To be determined Equipment Details: PT- , OT-  Patient/family involved in discharge planning: PT- Patient,  OT-Patient, SLP-Patient, Family member/caregiver  MD ELOS: 14-18 days. Medical Rehab Prognosis:  Good Assessment:  83 y.o.right handed femalewith history of Parkinson's disease maintained on Sinemet, CVA with residual left-sided weakness maintained on Plavix. Used an  assistive device prior to admission. She has a home health aide 10-6 7 days a week, recently changed to 10-4. She needed assistance with some dressing and bathing. She does not donn [name] her socks or shoes independently or drive or get her groceries.She was receiving HH therapies prior to admission.One level home no steps to entry. Presented 04/27/2016 with slurred speech. She was hypertensive 190/70. Mild hyponatremia 132. Troponin negative. Cranial CT scan reviewed, unremarkable for acute process. She did not receive TPA. MRI showed acute early subacute infarct within the left frontal centrum semiovale. No acute hemorrhage. MRA with no evidence of large vessel occlusion or aneurysm. Carotid Dopplers with no ICA stenosis. Echocardiogram With ejection fraction [id number] grade 1 diastolic dysfunction. Neurology consulted presently maintained on aspirin and Plavix for CVA prophylaxis 3 months then Plavix alone. Pt with resulting functional deficits with mobility, endurance, balance, dysphagia.  Will set goals for supervision for most tasks with PT/OT/SLP.  See Team Conference Notes for weekly updates to the plan of care

## 2016-05-03 NOTE — Progress Notes (Signed)
Occupational Therapy Note  Patient Details  Name: AFIA ZASADA MRN: [id number] Date of Birth: 08-03-1926  Today's Date: 05/03/2016 OT Missed Time: 15 Minutes Missed Time Reason: Patient unwilling/refused to participate without medical reason  Pt missed 30 mins scheduled OT session secondary to headache and nausea.  Pt reports having received pain meds this AM but still feeling nauseous.  Pt stating she had attempted therapies earlier in AM and was unable to participate at this time.  Will attempt to follow up as schedule allows.  Simonne Come 05/03/2016, 12:04 PM

## 2016-05-04 ENCOUNTER — Inpatient Hospital Stay (HOSPITAL_COMMUNITY): Payer: Medicare Other | Admitting: Speech Pathology

## 2016-05-04 ENCOUNTER — Inpatient Hospital Stay (HOSPITAL_COMMUNITY): Payer: Medicare Other | Admitting: Physical Therapy

## 2016-05-04 ENCOUNTER — Inpatient Hospital Stay (HOSPITAL_COMMUNITY): Payer: Medicare Other | Admitting: Occupational Therapy

## 2016-05-04 DIAGNOSIS — G43A1 Cyclical vomiting, intractable: Secondary | ICD-10-CM

## 2016-05-04 MED ORDER — PANTOPRAZOLE SODIUM 40 MG PO TBEC
40.0000 mg | DELAYED_RELEASE_TABLET | Freq: Every day | ORAL | Status: DC
  Administered 2016-05-04 – 2016-05-13 (×10): 40 mg via ORAL
  Filled 2016-05-04 (×10): qty 1

## 2016-05-04 MED ORDER — ONDANSETRON HCL 4 MG PO TABS
8.0000 mg | ORAL_TABLET | ORAL | Status: DC | PRN
  Administered 2016-05-04 – 2016-05-11 (×15): 8 mg via ORAL
  Filled 2016-05-04 (×15): qty 2

## 2016-05-04 MED ORDER — SENNOSIDES-DOCUSATE SODIUM 8.6-50 MG PO TABS
2.0000 | ORAL_TABLET | Freq: Every day | ORAL | Status: DC
  Administered 2016-05-04 – 2016-05-12 (×7): 2 via ORAL
  Filled 2016-05-04 (×9): qty 2

## 2016-05-04 MED ORDER — ONDANSETRON HCL 4 MG/2ML IJ SOLN: 4.0000 mg | Freq: Four times a day (QID) | INTRAMUSCULAR | Status: DC | PRN

## 2016-05-04 NOTE — Progress Notes (Signed)
Subjective/Complaints: Pt seen laying in bed this AM.  She slept well overnight.  She notes she had a small BM yesterday, however, per nursing, good BM.  Pt states the change in antiemetic was effective yesterday, but seems to wear off.    ROS: +Nausea. Denies CP, SOB, N/V/D.  Objective: Vital Signs: Blood pressure (!) 149/53, pulse 80, temperature 98.7 F (37.1 C), temperature source Oral, resp. rate 18, height 5' (1.524 m), weight 74.2 kg (163 lb 9.6 oz), SpO2 96 %. No results found. Results for orders placed or performed during the hospital encounter of 04/30/16 (from the past 72 hour(s))  CBC WITH DIFFERENTIAL     Status: None   Collection Time: 05/03/16  6:39 AM  Result Value Ref Range   WBC 5.0 4.0 - 10.5 K/uL   RBC 4.09 3.87 - 5.11 MIL/uL   Hemoglobin 12.2 12.0 - 15.0 g/dL   HCT 38.1 36.0 - 46.0 %   MCV 93.2 78.0 - 100.0 fL   MCH 29.8 26.0 - 34.0 pg   MCHC 32.0 30.0 - 36.0 g/dL   RDW 13.6 11.5 - 15.5 %   Platelets 306 150 - 400 K/uL   Neutrophils Relative % 55 %   Neutro Abs 2.8 1.7 - 7.7 K/uL   Lymphocytes Relative 30 %   Lymphs Abs 1.5 0.7 - 4.0 K/uL   Monocytes Relative 11 %   Monocytes Absolute 0.6 0.1 - 1.0 K/uL   Eosinophils Relative 4 %   Eosinophils Absolute 0.2 0.0 - 0.7 K/uL   Basophils Relative 0 %   Basophils Absolute 0.0 0.0 - 0.1 K/uL  Comprehensive metabolic panel     Status: Abnormal   Collection Time: 05/03/16  6:39 AM  Result Value Ref Range   Sodium 133 (L) 135 - 145 mmol/L   Potassium 3.9 3.5 - 5.1 mmol/L   Chloride 103 101 - 111 mmol/L   CO2 24 22 - 32 mmol/L   Glucose, Bld 99 65 - 99 mg/dL   BUN 9 6 - 20 mg/dL   Creatinine, Ser 0.72 0.44 - 1.00 mg/dL   Calcium 8.8 (L) 8.9 - 10.3 mg/dL   Total Protein 5.9 (L) 6.5 - 8.1 g/dL   Albumin 3.2 (L) 3.5 - 5.0 g/dL   AST 13 (L) 15 - 41 U/L   ALT 8 (L) 14 - 54 U/L   Alkaline Phosphatase 42 38 - 126 U/L   Total Bilirubin 0.5 0.3 - 1.2 mg/dL   GFR calc non Af Amer >60 >60 mL/min   GFR calc Af Amer  >60 >60 mL/min    Comment: (NOTE) The eGFR has been calculated using the CKD EPI equation. This calculation has not been validated in all clinical situations. eGFR's persistently <60 mL/min signify possible Chronic Kidney Disease.    Anion gap 6 5 - 15    General: NAD. Vital signs reviewed.  Heart: RRR. No JVD. Lungs: Clear to auscultation, breathing unlabored. Abdomen: Positive bowel sounds, soft Musculoskeletal: No edema. No tenderness Neurologic:  Sensation: Hyperalgesia distal b/l knees Motor: 4+/5 in bilateral deltoid, bicep, tricep, grip, right hip flexor, knee extensors, ankle dorsiflexor and plantar flexor 4+/5 in LLE, hip flexor, knee extensor, ankle dorsiflexor and plantar flexor with some apraxia  B/l LE slightly limited below knees due to neuropathy Skin: No evidence of breakdown, no evidence of rash Psych: Mood and affect are appropriate  Assessment/Plan: 1. Functional deficits secondary to Left frontal, left corona radiata infarct which require 3+ hours per day of  interdisciplinary therapy in a comprehensive inpatient rehab setting. Physiatrist is providing close team supervision and 24 hour management of active medical problems listed below. Physiatrist and rehab team continue to assess barriers to discharge/monitor patient progress toward functional and medical goals. FIM: Function - Bathing Bathing activity did not occur: Refused  Function- Upper Body Dressing/Undressing Upper body dressing/undressing activity did not occur: Refused Function - Lower Body Dressing/Undressing Lower body dressing/undressing activity did not occur: Refused  Function - Toileting Toileting activity did not occur: No continent bowel/bladder event Toileting steps completed by patient: Adjust clothing prior to toileting, Performs perineal hygiene Toileting steps completed by helper: Adjust clothing after toileting Assist level: Touching or steadying assistance (Pt.75%)  Function -  Air cabin crew transfer activity did not occur: N/A Toilet transfer assistive device: Elevated toilet seat/BSC over toilet Assist level to toilet: Touching or steadying assistance (Pt > 75%) Assist level from toilet: Moderate assist (Pt 50 - 74%/lift or lower)  Function - Chair/bed transfer Chair/bed transfer activity did not occur: N/A Chair/bed transfer method: Ambulatory Chair/bed transfer assist level: Touching or steadying assistance (Pt > 75%) Chair/bed transfer assistive device: Environmental consultant, Armrests  Function - Locomotion: Ambulation Assistive device: Other (comment) (rollator) Max distance: 33' Assist level: Touching or steadying assistance (Pt > 75%) Assist level: Touching or steadying assistance (Pt > 75%) Assist level: Touching or steadying assistance (Pt > 75%) Walk 150 feet activity did not occur: Safety/medical concerns (per report) Walk 10 feet on uneven surfaces activity did not occur: Safety/medical concerns  Function - Comprehension Comprehension: Auditory Comprehension assist level: Understands complex 90% of the time/cues 10% of the time  Function - Expression Expression: Verbal Expression assist level: Expresses basic 75 - 89% of the time/requires cueing 10 - 24% of the time. Needs helper to occlude trach/needs to repeat words.  Function - Social Interaction Social Interaction assist level: Interacts appropriately 90% of the time - Needs monitoring or encouragement for participation or interaction.  Function - Problem Solving Problem solving assist level: Solves basic 90% of the time/requires cueing < 10% of the time  Function - Memory Memory assist level: Recognizes or recalls 50 - 74% of the time/requires cueing 25 - 49% of the time Patient normally able to recall (first 3 days only): Current season, Location of own room, Staff names and faces, That he or she is in a hospital    [place] 05/04/2016, 8:42 AM    Medical Problem List and  Plan: 1. Gait disorder/apraxia with dysphagia/dysarthriasecondary to left frontal/corona radiata infarct secondary to small vessel disease as well as history of CVA in the past with left side mild residual weakness  Cont CIR  Weekend notes and history reviewed, discussed with OT.   2. Anticoagulation: Subcutaneous Lovenox. Monitor platelet counts and any signs of bleeding 3. Pain Management/hx of chronic low back pain -Tylenol, tramadol, not helping for headache, trial topiramate - started 2/4, appears to be improving 4. Mood: Xanax 0.5 mg twice a day, Pamelor 25 mg daily at bedtime, Trilafon 2 mg daily at bedtime, Halcion 0.125 mg bedtime as needed, monitor for oversedation with the addition of topiramate 5. Neuropsych: This patient iscapable of making decisions on herown behalf. 6. Skin/Wound Care: Routine skin checks 7. Fluids/Electrolytes/Nutrition: Routine I&Os 8.Dysphagia. Dysphagia #1 thin liquids. Monitor hydration. Follow-up speech therapy Has hx of esophageal stricture  Cont protonix 9.Parkinson's disease. Sinemet 25-1003 times a day -monitor PD symptoms with therapy/activity and address as indicated.  10.Hyperlipidemia. Pravachol 11. Hypoalbuminemia  Supplement initiated  2/5 12. Severe Nausea  Slightly improved, still limiting therapies and oral intake  Zofran increased to 71m, will increase freq to q4 PRN  Will cont to monitor 13. Constipation  May also be contributing to nausea  Increased reg again on 2/6  [facility]

## 2016-05-04 NOTE — Progress Notes (Signed)
Occupational Therapy Note  Patient Details  Name: Sarah [name] MRN: [id number] Date of Birth: 1926/08/18   Pt's plan of care adjusted to 15/7 after speaking with care team and discussed with PA  as pt currently unable to tolerate current therapy schedule with OT, PT, and SLP.    Darleen Crocker P 05/04/2016, 10:04 AM

## 2016-05-04 NOTE — Progress Notes (Signed)
Physical Therapy Session Note  Patient Details  Name: Sarah [name] MRN: [id number] Date of Birth: 07/18/1926  Today's Date: 05/04/2016 PT Individual Time: 1100-1210 2820-6015 PT Individual Time Calculation (min): 70 min and 28 min  Short Term Goals: Week 1:  PT Short Term Goal 1 (Week 1): Patient will consistently perform transfers with min assist  PT Short Term Goal 2 (Week 1): Patient will initiate stait training PT Short Term Goal 3 (Week 1): Patient will ambulate 59f with min assist and Rw  PT Short Term Goal 4 (Week 1): Patient will perform bed mobility with Min assist  PT Short Term Goal 5 (Week 1): Paitent will propell WC 1017fwith supervision assist.   Skilled Therapeutic Interventions/Progress Updates: Tx1: Pt presented in bed agreeable to therapy. Pt stating has HA/dizziness but not as severe as previous. Performed supine to sit at EOB with minA and HOB elevated. Pt with no significant increase in dizziness upon sitting. Performed sit to stand with RW and minA.  Pt took several steps to w/c. Instructed in w/c use and pt was able to propel approx 7586fith minA for straightaways and modA for turns. Pt required additional time for w/c mobility, pt with increased dizziness after w/c propulsion. Returned to room and able to tolerate seated LE therex in dimmed lighting. Performed LAQ, hip flexion, hip abd/add, manually resisted HS pulls, ankle pumps x 15 bilaterally. Pt request to return to bed at end of session. Pt able to perform stand pivot transfer with HHA and returned to bed. Pt able [facility] and use of drawsheet. Pt left in room with alarm on, call bell within reach and all current needs met.   Tx2: Pt presented in bed anxious regarding timing of anti-nausea meds. Spoke with nsg and advised pt now on schedule for meds. Performed supine to sit with minA for LE placement and truncal support. Sit to stand from bed with min guard. Pt ambulated [id number] RW and min  cues for erect posture.  Decreased cadence and bilateral foot clearance noted. Pt returned to room and remained in w/c with dgt present and all current needs met.      Therapy Documentation Precautions:  Precautions Precautions: Fall Restrictions Weight Bearing Restrictions: No   See Function Navigator for Current Functional Status.   Therapy/Group: Individual Therapy  Kaysia Willard  Taler Kushner, PTA  05/04/2016, 4:21 PM

## 2016-05-04 NOTE — Progress Notes (Signed)
Speech Language Pathology Daily Session Note  Patient Details  Name: Sarah [name] MRN: [id number] Date of Birth: [birth year]  Today's Date: 05/04/2016 SLP Individual Time: 1330-1400 SLP Individual Time Calculation (min): 30 min  Short Term Goals: Week 1: SLP Short Term Goal 1 (Week 1): Patient will consume trials of Dys.2 textures, with Mod I recall to alternate solids and liquids to effectively prevent s/s of regurgitation.  SLP Short Term Goal 2 (Week 1): Patient will utilize word finding strategies in strucutred tasks with Mod assist question cues.  SLP Short Term Goal 3 (Week 1): Patient will utilize comepsatory strategies for short term recall of new and daily information with Mod assist question cues.   Skilled Therapeutic Interventions: Skilled treatment session focused on dysphagia and speech intelligibility goals. SLP facilitated session by providing skilled observation with lunch meal of Dys. 1 textures with thin liquids without overt s/s of aspiration and intermittent verbal cues for use of swallowing compensatory strategies. SLP also provided supervision-Min A verbal cues for use of speech intelligibility strategies, over-articulation in particular to achieve 100% intelligibility at the sentence level during a verbal description task. Patient also required extra time for word-finding throughout session.      Function:  Eating Eating   Modified Consistency Diet: Yes Eating Assist Level: Supervision or verbal cues;Set up assist for   Eating Set Up Assist For: Opening containers       Cognition Comprehension Comprehension assist level: Understands complex 90% of the time/cues 10% of the time  Expression   Expression assist level: Expresses basic 75 - 89% of the time/requires cueing 10 - 24% of the time. Needs helper to occlude trach/needs to repeat words.  Social Interaction Social Interaction assist level: Interacts appropriately 90% of the time - Needs monitoring or  encouragement for participation or interaction.  Problem Solving Problem solving assist level: Solves basic 90% of the time/requires cueing < 10% of the time  Memory Memory assist level: Recognizes or recalls 50 - 74% of the time/requires cueing 25 - 49% of the time    Pain No/Denies Pain   Therapy/Group: Individual Therapy  Roen Macgowan 05/04/2016, 2:12 PM

## 2016-05-04 NOTE — Progress Notes (Signed)
Occupational Therapy Session Note  Patient Details  Name: Sarah [name] MRN: [id number] Date of Birth: 1926-04-19  Today's Date: 05/04/2016 OT Individual Time: [id number] OT Individual Time Calculation (min): 32 min  and Today's Date: 05/04/2016 OT Missed Time: 37 Minutes Missed Time Reason: Patient ill (comment);Patient unwilling/refused to participate without medical reason   Short Term Goals: Week 1:  OT Short Term Goal 1 (Week 1): Pt will perform toileting with min A for balance during clothing management and hygiene. OT Short Term Goal 2 (Week 1): Pt will perform shower transfer with min A and use of DME as needed. OT Short Term Goal 3 (Week 1): Pt will engage in 10 minutes of functional tasks with 2 or less rest breaks.  OT Short Term Goal 4 (Week 1): Pt will perform LB dressing with mod A in order to increase I with LB self care.   Skilled Therapeutic Interventions/Progress Updates:    Upon entering the room, pt supine in bed with NT present in the room. Pt continues to report 6/10 headache and nausea. Pt declines sitting EOB or OOB activities and reports, "I feel like I will be sick if I get up." Pt reports understanding the importance of participation but declines as she feels unwell at this time. Pt agreeable to grooming task with set up A for bed level. Pt remains supine in bed with call bell and all items within reach upon exiting the room.   Therapy Documentation Precautions:  Precautions Precautions: Fall Restrictions Weight Bearing Restrictions: No General: General OT Amount of Missed Time: 43 Minutes Vital Signs:  Pain: Pain Assessment Pain Assessment: Faces Faces Pain Scale: Hurts a little bit Pain Location: Head Pain Orientation: Anterior Pain Descriptors / Indicators: Headache Pain Frequency: Intermittent Pain Onset: On-going Patients Stated Pain Goal: 0 Pain Intervention(s): Rest;Relaxation ADL:   Exercises:   Other Treatments:    See Function  Navigator for Current Functional Status.   Therapy/Group: Individual Therapy  Gypsy Decant 05/04/2016, 10:01 AM

## 2016-05-05 ENCOUNTER — Inpatient Hospital Stay (HOSPITAL_COMMUNITY): Payer: Medicare Other | Admitting: Speech Pathology

## 2016-05-05 ENCOUNTER — Inpatient Hospital Stay (HOSPITAL_COMMUNITY): Payer: Medicare Other | Admitting: Physical Therapy

## 2016-05-05 ENCOUNTER — Inpatient Hospital Stay (HOSPITAL_COMMUNITY): Payer: Medicare Other | Admitting: Occupational Therapy

## 2016-05-05 DIAGNOSIS — R112 Nausea with vomiting, unspecified: Secondary | ICD-10-CM

## 2016-05-05 LAB — URINALYSIS, ROUTINE W REFLEX MICROSCOPIC
BILIRUBIN URINE: NEGATIVE
Glucose, UA: NEGATIVE mg/dL
Hgb urine dipstick: NEGATIVE
Ketones, ur: 5 mg/dL — AB
NITRITE: POSITIVE — AB
PH: 6 (ref 5.0–8.0)
Protein, ur: NEGATIVE mg/dL
SPECIFIC GRAVITY, URINE: 1.019 (ref 1.005–1.030)

## 2016-05-05 NOTE — Progress Notes (Signed)
Speech Language Pathology Daily Session Note  Patient Details  Name: Sarah [name] MRN: [id number] Date of Birth: [birth year]  Today's Date: 05/05/2016   Treatment Session #1 SLP Individual Time: [id number] SLP Individual Time Calculation (min): 30 min  Treatment Session #2 SLP Individual Time: [id number] SLP Individual Time Calculation (min): 30 min  Short Term Goals: Week 1: SLP Short Term Goal 1 (Week 1): Patient will consume trials of Dys.2 textures, with Mod I recall to alternate solids and liquids to effectively prevent s/s of regurgitation.  SLP Short Term Goal 2 (Week 1): Patient will utilize word finding strategies in strucutred tasks with Mod assist question cues.  SLP Short Term Goal 3 (Week 1): Patient will utilize comepsatory strategies for short term recall of new and daily information with Mod assist question cues.   Skilled Therapeutic Interventions:  Treatment session #1 - Skilled treatment session focused on dysaphgai and speech goals. SLP facilitated session by providing skilled observation of dysphagia 2 diet textures with thin liquids. Pt consumed with supervision cues. Given Min A verbal cues, pt able to utilize compensatory strategies to recall new and daily information (i.e. Schedule and orientation information). Pt was returned to room, left upright in wheelchair with paid caregiver present. Continue per current plan of care.   Treatment session #2 - Skilled treatment session focused on speech communication goals. SLP facilitated session by providing Min A verbal cues to utilize word finding strategies at the unstructured word level for ~90% intelligibility. Pt was left in dayroom, upright in wheelchair with caregiver. Continue per current plan of care.      Function:  Eating Eating   Modified Consistency Diet: Yes Eating Assist Level: Supervision or verbal cues;Set up assist for   Eating Set Up Assist For: Opening containers       Cognition Comprehension  Comprehension assist level: Understands complex 90% of the time/cues 10% of the time  Expression   Expression assist level: Expresses basic 75 - 89% of the time/requires cueing 10 - 24% of the time. Needs helper to occlude trach/needs to repeat words.  Social Interaction Social Interaction assist level: Interacts appropriately 90% of the time - Needs monitoring or encouragement for participation or interaction.  Problem Solving Problem solving assist level: Solves basic 90% of the time/requires cueing < 10% of the time  Memory Memory assist level: Recognizes or recalls 50 - 74% of the time/requires cueing 25 - 49% of the time    Pain Pain Assessment Pain Assessment: 0-10 Pain Score: 6  Pain Type: Acute pain Pain Location: Head Patients Stated Pain Goal: 0 Pain Intervention(s): Medication (See eMAR) Multiple Pain Sites: No  Therapy/Group: Individual Therapy  Carolene Gitto B. Rutherford Nail, M.S., CCC-SLP Speech-Language Pathologist  Hero Mccathern 05/05/2016, 11:42 AM

## 2016-05-05 NOTE — Care Management Note (Signed)
Inpatient [facility] Statement of Services  Patient Name:  Sarah [name]  Date:  05/04/2016  Welcome to the [facility].  Our goal is to provide you with an individualized program based on your diagnosis and situation, designed to meet your specific needs.  With this comprehensive rehabilitation program, you will be expected to participate in at least 3 hours of rehabilitation therapies Monday-Friday, with modified therapy programming on the weekends.  Your rehabilitation program will include the following services:  Physical Therapy (PT), Occupational Therapy (OT), Speech Therapy (ST), 24 hour per day rehabilitation nursing, Therapeutic Recreaction (TR), Neuropsychology, Case Management (Social Worker), Rehabilitation Medicine, Nutrition Services and Pharmacy Services  Weekly team conferences will be held on Wednesdays to discuss your progress.  Your Social Worker will talk with you frequently to get your input and to update you on team discussions.  Team conferences with you and your family in attendance may also be held.  Expected length of stay: 10-14 days  Overall anticipated outcome: supervision/ min assist  Depending on your progress and recovery, your program may change. Your Social Worker will coordinate services and will keep you informed of any changes. Your Social Worker's name and contact numbers are listed  below.  The following services may also be recommended but are not provided by the [facility] will be made to provide these services after discharge if needed.  Arrangements include referral to agencies that provide these services.  Your insurance has been verified to be:  Medicare and [place] Your primary doctor is:  Dr. Sharlet Salina  Pertinent information will be shared with your doctor and your  insurance company.  Social Worker:  [place], [place] or ([phone]   Information discussed with and copy given to patient by: Lennart Pall, 05/05/2016, 8:35 AM

## 2016-05-05 NOTE — Progress Notes (Signed)
Subjective/Complaints: Pt sitting up in her chair, eating breakfast this AM.  She slept well overnight.  She notes significant improvement in nausea and headaches. But notes she still has not had a BM.   ROS: +Constipation. Denies CP, SOB, N/V/D.  Objective: Vital Signs: Blood pressure (!) 123/48, pulse 77, temperature 98 F (36.7 C), temperature source Oral, resp. rate 18, height 5' (1.524 m), weight 74.2 kg (163 lb 9.6 oz), SpO2 93 %. No results found. Results for orders placed or performed during the hospital encounter of 04/30/16 (from the past 72 hour(s))  CBC WITH DIFFERENTIAL     Status: None   Collection Time: 05/03/16  6:39 AM  Result Value Ref Range   WBC 5.0 4.0 - 10.5 K/uL   RBC 4.09 3.87 - 5.11 MIL/uL   Hemoglobin 12.2 12.0 - 15.0 g/dL   HCT 38.1 36.0 - 46.0 %   MCV 93.2 78.0 - 100.0 fL   MCH 29.8 26.0 - 34.0 pg   MCHC 32.0 30.0 - 36.0 g/dL   RDW 13.6 11.5 - 15.5 %   Platelets 306 150 - 400 K/uL   Neutrophils Relative % 55 %   Neutro Abs 2.8 1.7 - 7.7 K/uL   Lymphocytes Relative 30 %   Lymphs Abs 1.5 0.7 - 4.0 K/uL   Monocytes Relative 11 %   Monocytes Absolute 0.6 0.1 - 1.0 K/uL   Eosinophils Relative 4 %   Eosinophils Absolute 0.2 0.0 - 0.7 K/uL   Basophils Relative 0 %   Basophils Absolute 0.0 0.0 - 0.1 K/uL  Comprehensive metabolic panel     Status: Abnormal   Collection Time: 05/03/16  6:39 AM  Result Value Ref Range   Sodium 133 (L) 135 - 145 mmol/L   Potassium 3.9 3.5 - 5.1 mmol/L   Chloride 103 101 - 111 mmol/L   CO2 24 22 - 32 mmol/L   Glucose, Bld 99 65 - 99 mg/dL   BUN 9 6 - 20 mg/dL   Creatinine, Ser 0.72 0.44 - 1.00 mg/dL   Calcium 8.8 (L) 8.9 - 10.3 mg/dL   Total Protein 5.9 (L) 6.5 - 8.1 g/dL   Albumin 3.2 (L) 3.5 - 5.0 g/dL   AST 13 (L) 15 - 41 U/L   ALT 8 (L) 14 - 54 U/L   Alkaline Phosphatase 42 38 - 126 U/L   Total Bilirubin 0.5 0.3 - 1.2 mg/dL   GFR calc non Af Amer >60 >60 mL/min   GFR calc Af Amer >60 >60 mL/min    Comment:  (NOTE) The eGFR has been calculated using the CKD EPI equation. This calculation has not been validated in all clinical situations. eGFR's persistently <60 mL/min signify possible Chronic Kidney Disease.    Anion gap 6 5 - 15    General: NAD. Vital signs reviewed.  Heart: RRR. No JVD. Lungs: Clear to auscultation, breathing unlabored. Abdomen: Positive bowel sounds, soft Musculoskeletal: No edema. No tenderness Neurologic:  Sensation: Hyperalgesia distal b/l knees Motor: 4+/5 in bilateral deltoid, bicep, tricep, grip, right hip flexor, knee extensors, ankle dorsiflexor and plantar flexor 4+/5 in LLE, hip flexor, knee extensor, ankle dorsiflexor and plantar flexor with some apraxia (unchanged) B/l LE slightly limited below knees due to neuropathy Skin: No evidence of breakdown, no evidence of rash Psych: Mood and affect are appropriate  Assessment/Plan: 1. Functional deficits secondary to Left frontal, left corona radiata infarct which require 3+ hours per day of interdisciplinary therapy in a comprehensive inpatient rehab setting. Physiatrist  is providing close team supervision and 24 hour management of active medical problems listed below. Physiatrist and rehab team continue to assess barriers to discharge/monitor patient progress toward functional and medical goals. FIM: Function - Bathing Bathing activity did not occur: Refused  Function- Upper Body Dressing/Undressing Upper body dressing/undressing activity did not occur: Refused Function - Lower Body Dressing/Undressing Lower body dressing/undressing activity did not occur: Refused  Function - Toileting Toileting activity did not occur: No continent bowel/bladder event Toileting steps completed by patient: Adjust clothing prior to toileting, Performs perineal hygiene Toileting steps completed by helper: Adjust clothing after toileting Assist level: Touching or steadying assistance (Pt.75%)  Function - Engineer, petroleum transfer activity did not occur: N/A Toilet transfer assistive device: Elevated toilet seat/BSC over toilet Assist level to toilet: Touching or steadying assistance (Pt > 75%) Assist level from toilet: Moderate assist (Pt 50 - 74%/lift or lower)  Function - Chair/bed transfer Chair/bed transfer activity did not occur: N/A Chair/bed transfer method: Ambulatory Chair/bed transfer assist level: Touching or steadying assistance (Pt > 75%) Chair/bed transfer assistive device: Environmental consultant, Armrests  Function - Locomotion: Wheelchair Type: Manual Max wheelchair distance: 75 Assist Level: Touching or steadying assistance (Pt > 75%) Assist Level: Touching or steadying assistance (Pt > 75%) Function - Locomotion: Ambulation Assistive device: Walker-rolling Max distance: 125 Assist level: Touching or steadying assistance (Pt > 75%) Assist level: Touching or steadying assistance (Pt > 75%) Assist level: Touching or steadying assistance (Pt > 75%) Walk 150 feet activity did not occur: Safety/medical concerns (per report) Walk 10 feet on uneven surfaces activity did not occur: Safety/medical concerns  Function - Comprehension Comprehension: Auditory Comprehension assist level: Understands complex 90% of the time/cues 10% of the time  Function - Expression Expression: Verbal Expression assist level: Expresses basic 75 - 89% of the time/requires cueing 10 - 24% of the time. Needs helper to occlude trach/needs to repeat words.  Function - Social Interaction Social Interaction assist level: Interacts appropriately 90% of the time - Needs monitoring or encouragement for participation or interaction.  Function - Problem Solving Problem solving assist level: Solves basic 90% of the time/requires cueing < 10% of the time  Function - Memory Memory assist level: Recognizes or recalls 50 - 74% of the time/requires cueing 25 - 49% of the time Patient normally able to recall (first 3 days  only): Current season, Location of own room, Staff names and faces, That he or she is in a hospital    [place] 05/05/2016, 8:49 AM    Medical Problem List and Plan: 1. Gait disorder/apraxia with dysphagia/dysarthriasecondary to left frontal/corona radiata infarct secondary to small vessel disease as well as history of CVA in the past with left side mild residual weakness  Cont CIR 2. Anticoagulation: Subcutaneous Lovenox. Monitor platelet counts and any signs of bleeding 3. Pain Management/hx of chronic low back pain -Tylenol, tramadol, not helping for headache, trial topiramate - started 2/4, improving 4. Mood: Xanax 0.5 mg twice a day, Pamelor 25 mg daily at bedtime, Trilafon 2 mg daily at bedtime, Halcion 0.125 mg bedtime as needed, monitor for oversedation with the addition of topiramate 5. Neuropsych: This patient iscapable of making decisions on herown behalf. 6. Skin/Wound Care: Routine skin checks 7. Fluids/Electrolytes/Nutrition: Routine I&Os 8.Dysphagia. Dysphagia #1 thin liquids. Monitor hydration. Follow-up speech therapy Has hx of esophageal stricture  Cont protonix 9.Parkinson's disease. Sinemet 25-1003 times a day -monitor PD symptoms with therapy/activity and address as indicated.  10.Hyperlipidemia. Pravachol 11. Hypoalbuminemia  Supplement initiated 2/5 12. Nausea  Improving   Zofran increased to 61m, increased to q4 PRN 2/6  Will cont to monitor 13. Constipation  May also be contributing to nausea  Increased reg again on 2/6  Will cont to monitor and consider further increase tomorrow    LOS (Days): 5 A FACE TO FACE EVALUATION WAS PERFORMED

## 2016-05-05 NOTE — Plan of Care (Signed)
Problem: RH PAIN MANAGEMENT Goal: RH STG PAIN MANAGED AT OR BELOW PT'S PAIN GOAL <3 on a 0-10 pain scale  Outcome: Not Progressing Headache unrelieved by tylenol

## 2016-05-05 NOTE — Progress Notes (Signed)
Occupational Therapy Session Note  Patient Details  Name: Sarah [name] MRN: [id number] Date of Birth: 06-Jul-1926  Today's Date: 05/05/2016 OT Individual Time: 0900-1000 OT Individual Time Calculation (min): 60 min    Short Term Goals: Week 1:  OT Short Term Goal 1 (Week 1): Pt will perform toileting with min A for balance during clothing management and hygiene. OT Short Term Goal 2 (Week 1): Pt will perform shower transfer with min A and use of DME as needed. OT Short Term Goal 3 (Week 1): Pt will engage in 10 minutes of functional tasks with 2 or less rest breaks.  OT Short Term Goal 4 (Week 1): Pt will perform LB dressing with mod A in order to increase I with LB self care.   Skilled Therapeutic Interventions/Progress Updates:    Upon entering the room, pt supine in bed upon entering the room. Pt stating, "I don't feel good. I am still nauseated." Pt needing coaxing for participation but agreeable to engage in self care tasks while seated on EOB. Pt performed grooming tasks with set up A. Pt standing from bed with mod lifting assistance this session. Pt needing min A for balance during hygiene and LB clothing management. Pt performed UB self care while in seated position with set up A. Pt needing rest break secondary to fatigue. Pt declined sitting up as she continues to report fatigue. She returns to supine with min A. Call bell and all needed items within reach upon exiting the room.   Therapy Documentation Precautions:  Precautions Precautions: Fall Restrictions Weight Bearing Restrictions: No General:   Vital Signs:  Pain: Pain Assessment Pain Assessment: 0-10 Pain Score: 6  Pain Type: Acute pain Pain Location: Head Patients Stated Pain Goal: 0 Pain Intervention(s): Medication (See eMAR) Multiple Pain Sites: No ADL:   Exercises:   Other Treatments:    See Function Navigator for Current Functional Status.   Therapy/Group: Individual Therapy  Gypsy Decant 05/05/2016, 10:58 AM

## 2016-05-05 NOTE — Progress Notes (Signed)
Physical Therapy Session Note  Patient Details  Name: Sarah [name] MRN: [id number] Date of Birth: 1927-01-06  Today's Date: 05/05/2016 PT Individual Time:  -      Short Term Goals: Week 1:  PT Short Term Goal 1 (Week 1): Patient will consistently perform transfers with min assist  PT Short Term Goal 2 (Week 1): Patient will initiate stait training PT Short Term Goal 3 (Week 1): Patient will ambulate 53ft with min assist and Rw  PT Short Term Goal 4 (Week 1): Patient will perform bed mobility with Min assist  PT Short Term Goal 5 (Week 1): Paitent will propell WC 138ft with supervision assist.   Skilled Therapeutic Interventions/Progress Updates:  Pt presented in w/c with PCA present agreeable to therapy. Ambulated 100t x 1 with RW with min guard and cues to maintain erect posture. Pt indicated increased fatigue today. Performed sit to/from stand with min guard progressing to close supervision with cues for hand placement. Performed NuStep L2 x5:30 for endurance and BLE strengthening. Pt able to perform NuStep without break. Upon returning to room pt requesting to use toilet. Pt ambulated to toilet from bed PTA doffed briefs for time management. Pt left in toilet with understanding to pull call bell when ready and caregiver present. Nsg informed.      Therapy Documentation Precautions:  Precautions Precautions: Fall Restrictions Weight Bearing Restrictions: No   See Function Navigator for Current Functional Status.   Therapy/Group: Individual Therapy  Jin Capote  Mychele Seyller, PTA  05/05/2016, 7:53 AM

## 2016-05-05 NOTE — Progress Notes (Signed)
Social Work  Social Work Assessment and Plan  Patient Details  Name: Sarah [name] MRN: [id number] Date of Birth: 07/02/1926  Today's Date: 05/03/2016  Problem List:  Patient Active Problem List   Diagnosis Date Noted  . Vascular headache   . Intractable vomiting with nausea   . Slow transit constipation   . Small vessel disease, cerebrovascular 04/30/2016  . Acute CVA (cerebrovascular accident) ([place])   . Dysphagia, post-stroke   . History of cerebrovascular accident (CVA) with residual deficit   . Benign essential HTN   . Slurred speech 04/27/2016  . CVA (cerebral vascular accident) ([place]) 04/27/2016  . Hypertension 04/27/2016  . Dysarthria 04/27/2016  . History of stroke 04/27/2016  . Acute focal neurological deficit 04/27/2016  . Pansinusitis 10/20/2015  . Primary Parkinsonism ([place]) 09/26/2015  . Peripheral edema 05/12/2015  . DOE (dyspnea on exertion) 02/12/2015  . Neck pain 01/10/2015  . Routine general medical examination at a health care facility 09/17/2014  . Hyperglycemia 07/09/2014  . Acute ischemic right MCA stroke ([place]) 07/03/2014  . Degenerative arthritis of knee, bilateral 03/01/2014  . Fall 10/05/2013  . Hyponatremia 10/05/2013  . Memory deficits 10/05/2013  . Arthritis of sacroiliac joint ([place]) 09/11/2013  . Lumbosacral spondylosis without myelopathy 02/26/2013  . Abnormality of gait 12/15/2012  . Right shoulder pain   . Vitamin B12 deficiency 04/13/2012  . Hypercholesterolemia 11/05/2011  . Osteoporosis, postmenopausal 06/02/2011  . Mixed incontinence urge and stress 06/02/2011  . Anxiety   . Edema 04/06/2011  . Tachycardia 04/06/2011  . Osteoarthritis    Past Medical History:  Past Medical History:  Diagnosis Date  . Bladder incontinence   . Chest pain    S/P nuclear 07/17/2008  normal; EF 88% with no ischemia  . Chronic venous insufficiency    BLE edema, chronic, neg venous US 2/13  . Constipation    hx of  . Depression   . Diverticulitis    . Esophageal stricture    s/p dilation 04/2012  . GERD (gastroesophageal reflux disease)   . History of chicken pox   . Hyperlipidemia   . Hypertension   . Osteoarthritis   . Osteoporosis, postmenopausal 06/02/2011  . Polyp of colon   . Stroke [facility]) 07/03/2014   Past Surgical History:  Past Surgical History:  Procedure Laterality Date  . APPENDECTOMY     age 28  . COLONOSCOPY    . POLYPECTOMY    . TUBAL LIGATION    . UPPER GASTROINTESTINAL ENDOSCOPY     Social History:  reports that she has never smoked. She has never used smokeless tobacco. She reports that she does not drink alcohol or use drugs.  Family / Support Systems Marital Status: Widow/Widower How Long?: 24 yrs Patient Roles: Parent, Other (Comment) (grand/ great-grandparent) Children: daughter, Charna Busman [facility], [place] @ [phone]) [phone]; son, Elainna Jackovich [facility]) @ [phone] Other Supports: Pt's private caregiver, [name] @ [phone] Anticipated Caregiver: [name] is her hired caregiver and family planning to add another caregiver to cover 24/7 Ability/Limitations of Caregiver: [name] currently works from about 10-6 Caregiver Availability: 24/7 Family Dynamics: Daughter at bedside and VERY supportive of mother.  Pt and daughter note good communication among family and they describe very close relationships with grandchildren and great-grands.  Social History Preferred language: English Religion: Methodist Cultural Background: NA Read: Yes Write: Yes Employment Status: Retired Date Retired/Disabled/Unemployed: Pt actually stopped working when her children were born. Legal Hisotry/Current Legal Issues: None Guardian/Conservator: None - per MD,  pt is capable of making decisions on her own behalf   Abuse/Neglect Physical Abuse: Denies Verbal Abuse: Denies Sexual Abuse: Denies Exploitation of patient/patient's resources: Denies Self-Neglect: Denies  Emotional Status Pt's affect, behavior adn  adjustment status: Pt very pleasant and completes assessment interview without difficulty.  Becomes tearful when she talks about her family and the upcoming wedding of her grandaughter.  She is also tearful when she speaks about her husband who died 29 yrs ago.  She states, "I haven't cried since I got her and now it won't stop."  Daughter very supporitve and they laugh as they begin crying together.  Will monitor as she progresses and likely refer for neuropsych support. Recent Psychosocial Issues: Diagnosed with Parkinson's ~ 1 yr ago and CVA ~2 yrs ago.   Pyschiatric History: Pt and daughter report pt "went through some depression" when her husband died, however, no formal counseling or medication.  Substance Abuse History: None  Patient / Family Perceptions, Expectations & Goals Pt/Family understanding of illness & functional limitations: Pt and family with good understanding of her CVA and current functional limitations/ need for CIR. Premorbid pt/family roles/activities: Pt had a private caregiver 10-6 daily who would assist her with b/d, medication management.  She was indepedent with her mobility. Anticipated changes in roles/activities/participation: Goals being set for supervison/ min so family prepared to hire more assistance. Pt/family expectations/goals: "I need to be at the wedding in March."  [place]: None Premorbid Home Care/DME Agencies: Other (Comment) (Private caregiver) Transportation available at discharge: yes Resource referrals recommended: Neuropsychology, Support group (specify)  Discharge Planning Living Arrangements: Alone Support Systems: Home care staff, Children Type of Residence: Private residence Insurance Resources: [employer], Multimedia programmer (specify) Nurse, mental health ) Financial Resources: [place] Referred: No Living Expenses: Own Money Management: Family Does the patient have any problems obtaining your  medications?: No Home Management: private caregiver Patient/Family Preliminary Plans: Pt to return home with Private caregivers covering 24/7 Social Work Anticipated Follow Up Needs: HH/OP Expected length of stay: 12-15 days   Clinical Impression Very pleasant, elderly woman here following a CVA (prior CVA and Parkinsons as well).  Daughter at bedside and very supportive.  Pt pleased with the start of rehab and states her primary goal is to be at her granddaughter's wedding in march.  She denies any significant emotional distress, however, is tearful when she talks about her deceased spouse (24 yrs ago) and her new great-grandchildren.  Daughter and I provide support - will monitor and refer for neuropsychology as indicated.  Family prepared to add another private caregiver into pt's home in order to cover needed 24/7 care.  Will follow for support and d/c planning needs.  Birt Reinoso 05/03/2016, 11:31 AM

## 2016-05-06 ENCOUNTER — Inpatient Hospital Stay (HOSPITAL_COMMUNITY): Payer: Medicare Other | Admitting: Occupational Therapy

## 2016-05-06 ENCOUNTER — Inpatient Hospital Stay (HOSPITAL_COMMUNITY): Payer: Medicare Other

## 2016-05-06 ENCOUNTER — Inpatient Hospital Stay (HOSPITAL_COMMUNITY): Payer: Medicare Other | Admitting: Speech Pathology

## 2016-05-06 DIAGNOSIS — N390 Urinary tract infection, site not specified: Secondary | ICD-10-CM

## 2016-05-06 MED ORDER — NITROFURANTOIN MONOHYD MACRO 100 MG PO CAPS
100.0000 mg | ORAL_CAPSULE | Freq: Two times a day (BID) | ORAL | Status: AC
  Administered 2016-05-06 – 2016-05-12 (×14): 100 mg via ORAL
  Filled 2016-05-06 (×15): qty 1

## 2016-05-06 NOTE — Progress Notes (Signed)
Physical Therapy Session Note  Patient Details  Name: Sarah [name] MRN: [id number] Date of Birth: [birth year]  Today's Date: 05/06/2016 PT Individual Time: 1415-1500 PT Individual Time Calculation (min): 45 min   Short Term Goals: Week 1:  PT Short Term Goal 1 (Week 1): Patient will consistently perform transfers with min assist  PT Short Term Goal 2 (Week 1): Patient will initiate stait training PT Short Term Goal 3 (Week 1): Patient will ambulate 47ft with min assist and Rw  PT Short Term Goal 4 (Week 1): Patient will perform bed mobility with Min assist  PT Short Term Goal 5 (Week 1): Paitent will propell WC 154ft with supervision assist.   Skilled Therapeutic Interventions/Progress Updates:   pt up at sink performing oral hygiene. Pt declines leaving room initially for therapy but with encouragement and education on importance of practicing home environment tasks, pt agreeable to gait in the hallway. Pt performed transfers with close supervision to steadying assist using rollator and gait on unit x 130' x 2 with rest break in between. In day room, discussed overall endurance and activity tolerance and importance of therapies outside of her room. End of session returned back to bed with mod assist to bring BLE onto the bed.   Therapy Documentation Precautions:  Precautions Precautions: Fall Restrictions Weight Bearing Restrictions: No  Pain:  reports headache - RN notified and pain medication given.    See Function Navigator for Current Functional Status.   Therapy/Group: Individual Therapy  Canary Brim Ivory Broad, PT, DPT  05/06/2016, 3:45 PM

## 2016-05-06 NOTE — Patient Care Conference (Signed)
Inpatient RehabilitationTeam Conference and Plan of Care Update Date: 05/05/2016   Time: 2:40 PM    Patient Name: Sarah [name]      Medical Record Number: [id number]  Date of Birth: 02-18-1927 Sex: Female         Room/Bed: 4W08C/4W08C-01 Payor Info: Payor: MEDICARE / Plan: MEDICARE PART A AND B / Product Type: *No Product type* /    Admitting Diagnosis: R CVA  Admit Date/Time:  04/30/2016  5:11 PM Admission Comments: No comment available   Primary Diagnosis:  <principal problem not specified> Principal Problem: <principal problem not specified>  Patient Active Problem List   Diagnosis Date Noted  . Acute lower UTI   . Non-intractable vomiting with nausea   . Vascular headache   . Intractable vomiting with nausea   . Slow transit constipation   . Small vessel disease, cerebrovascular 04/30/2016  . Acute CVA (cerebrovascular accident) ([place])   . Dysphagia, post-stroke   . History of cerebrovascular accident (CVA) with residual deficit   . Benign essential HTN   . Slurred speech 04/27/2016  . CVA (cerebral vascular accident) ([place]) 04/27/2016  . Hypertension 04/27/2016  . Dysarthria 04/27/2016  . History of stroke 04/27/2016  . Acute focal neurological deficit 04/27/2016  . Pansinusitis 10/20/2015  . Primary Parkinsonism ([place]) 09/26/2015  . Peripheral edema 05/12/2015  . DOE (dyspnea on exertion) 02/12/2015  . Neck pain 01/10/2015  . Routine general medical examination at a health care facility 09/17/2014  . Hyperglycemia 07/09/2014  . Acute ischemic right MCA stroke ([place]) 07/03/2014  . Degenerative arthritis of knee, bilateral 03/01/2014  . Fall 10/05/2013  . Hyponatremia 10/05/2013  . Memory deficits 10/05/2013  . Arthritis of sacroiliac joint ([place]) 09/11/2013  . Lumbosacral spondylosis without myelopathy 02/26/2013  . Abnormality of gait 12/15/2012  . Right shoulder pain   . Vitamin B12 deficiency 04/13/2012  . Hypercholesterolemia 11/05/2011  . Osteoporosis,  postmenopausal 06/02/2011  . Mixed incontinence urge and stress 06/02/2011  . Anxiety   . Edema 04/06/2011  . Tachycardia 04/06/2011  . Osteoarthritis     Expected Discharge Date: Expected Discharge Date: 05/13/16  Team Members Present: Physician leading conference: Dr. Delice Lesch Social Worker Present: Lennart Pall, LCSW Nurse Present: Heather Roberts, RN PT Present: Carney Living, PT;Other (comment) (Rosita Dechalus, PTA) OT Present: Benay Pillow, OT SLP Present: Other (comment) Stormy Fabian, SLP) [place] Coordinator present : Daiva Nakayama, RN, CRRN     Current Status/Progress Goal Weekly Team Focus  Medical   Gait disorder/apraxia with dysphagia/dysarthria secondary to left frontal/ corona radiata infarct secondary to small vessel disease as well as history of CVA in the past with left side mild residual weakness  Improve mobility, safety, transfers, nausea  See above   Bowel/Bladder   Continent of bowel and bladder. LBM 05/05/16  Pt to remain continent of bowel and bladder  Monitor    Swallow/Nutrition/ Hydration   Min A  Mod I  dysphagia 2 with thin liquids   ADL's   Max A LB self care, Mod A bathing, set up A for grooming and UB self care  supervision overall  self care retraining, participation, balance, functional transfers, pt education   Mobility   min/modA bed mobility, min guard 172ft gait, fair standing balance  supervison with functional mobility   balance, gait, w/c mobilty    Communication   Min A  Supervision  use of speech intelligibility and word finding strategies at the unstructured sentence level   Safety/Cognition/ Behavioral Observations  Min A  Supervision  Use of strategies to recall new, daily information   Pain   No c/o pain   <3  Monitor for nonverbal cues of pain   Skin   Abrasion proximal to L foot, Tegaderm placed to area for protection, Ecchymosis to BLE, BUE  Monitor for appropriate healing  Assess q shift. Encourage turn q2hrs    Rehab  Goals Patient on target to meet rehab goals: Yes *See Care Plan and progress notes for long and short-term goals.  Barriers to Discharge: Mobility, safety, transfers, nausea, headaches, constipation    Possible Resolutions to Barriers:  Therapies, adjusting nausea, headaches, bowel meds    Discharge Planning/Teaching Needs:  Plan for pt to return to her home with additional care being arranged in order to cover 24/7 assistance      Team Discussion:  Nausea and HA appear to be improving.  Cont b/b.  Better strength/ energy in the afternoon.  Currently mod/ max assist with OT.  Goals set for supervision but may decrease LB goals to min assist as caregiver was assisting with ADLs PTA.  Goals at supervision with mobility.    Revisions to Treatment Plan:  None   Continued Need for Acute Rehabilitation Level of Care: The patient requires daily medical management by a physician with specialized training in physical medicine and rehabilitation for the following conditions: Daily direction of a multidisciplinary physical rehabilitation program to ensure safe treatment while eliciting the highest outcome that is of practical value to the patient.: Yes Daily medical management of patient stability for increased activity during participation in an intensive rehabilitation regime.: Yes Daily analysis of laboratory values and/or radiology reports with any subsequent need for medication adjustment of medical intervention for : Neurological problems;Other  Sarah [name] 05/06/2016, 1:21 PM

## 2016-05-06 NOTE — Progress Notes (Signed)
Occupational Therapy Session Note  Patient Details  Name: Sarah [name] MRN: [id number] Date of Birth: 1926/09/29  Today's Date: 05/06/2016 OT Individual Time: [id number] and 1300-1330 OT Individual Time Calculation (min): 59 min and 30 min    Short Term Goals: Week 1:  OT Short Term Goal 1 (Week 1): Pt will perform toileting with min A for balance during clothing management and hygiene. OT Short Term Goal 2 (Week 1): Pt will perform shower transfer with min A and use of DME as needed. OT Short Term Goal 3 (Week 1): Pt will engage in 10 minutes of functional tasks with 2 or less rest breaks.  OT Short Term Goal 4 (Week 1): Pt will perform LB dressing with mod A in order to increase I with LB self care.   Skilled Therapeutic Interventions/Progress Updates:    Session 1: Upon entering the room, pt seated in wheelchair and reports, "I am still hungry." OT assisted pt in calling and ordering additional food for breakfast. Pt declined bathing and dressing this task as well as toileting. Pt agreeable to brushing teeth, combing hair, and washing face from wheelchair at sink side. OT demonstrated B UE strengthening exercises with use of 2 lb dowel rod. Pt performed 3 sets of 10 reps of bicep curls, chest presses, and straight arm raises. Her daughter arrived in room at end of session when breakfast arrived and she assisted with set up. Call bell and all needed items within reach upon exiting the room.   Session 2: Upon entering the room, pt seated in wheelchair with c/o nausea but agreeable to participate in OT intervention. Pt performed sit <>stand from wheelchair with steady assistance and ambulated with RW to closet 10' with steady assistance. Pt obtaining new nightgown and house coat. Pt returned to wheelchair and donned clothing items with sit <>stand and overall steady assistance for standing balance. Pt needing rest breaks during tasks secondary to reported fatigue. Pt remained seated in wheelchair  with call bell and all needed items within reach.   Therapy Documentation Precautions:  Precautions Precautions: Fall Restrictions Weight Bearing Restrictions: No General:   Vital Signs: Therapy Vitals Temp: 98.2 F (36.8 C) Temp Source: Tympanic Pulse Rate: 86 Resp: 16 BP: 134/60 Patient Position (if appropriate): Sitting Oxygen Therapy SpO2: 99 % O2 Device: Not Delivered Pain:   ADL:   Exercises:   Other Treatments:    See Function Navigator for Current Functional Status.   Therapy/Group: Individual Therapy  Gypsy Decant 05/06/2016, 4:26 PM

## 2016-05-06 NOTE — Progress Notes (Signed)
Speech Language Pathology Daily Session Note  Patient Details  Name: Sarah [name] MRN: [id number] Date of Birth: 1926/10/11  Today's Date: 05/06/2016 SLP Individual Time: [id number] SLP Individual Time Calculation (min): 45 min  Short Term Goals: Week 1: SLP Short Term Goal 1 (Week 1): Patient will consume trials of Dys.2 textures, with Mod I recall to alternate solids and liquids to effectively prevent s/s of regurgitation.  SLP Short Term Goal 2 (Week 1): Patient will utilize word finding strategies in strucutred tasks with Mod assist question cues.  SLP Short Term Goal 3 (Week 1): Patient will utilize comepsatory strategies for short term recall of new and daily information with Mod assist question cues.   Skilled Therapeutic Interventions: Skilled treatment session focused on dysphagia and speech goals. SLP facilitated session by providing skilled observation with breakfast meal of Dys. 1 textures with thin liquids. Patient consumed meal without overt s/s of aspiration despite family reports of increased coughing with liquids and required Min A verbal cues for use of swallowing compensatory strategies. Patient verbalized at the sentence level and was Mod I for word-finding and speech intelligibility. Patient left upright in wheelchair with all needs within reach and family present. Continue with current plan of care.      Function:  Eating Eating   Modified Consistency Diet: Yes Eating Assist Level: Supervision or verbal cues;Set up assist for   Eating Set Up Assist For: Opening containers       Cognition Comprehension Comprehension assist level: Understands complex 90% of the time/cues 10% of the time  Expression   Expression assist level: Expresses basic 75 - 89% of the time/requires cueing 10 - 24% of the time. Needs helper to occlude trach/needs to repeat words.  Social Interaction Social Interaction assist level: Interacts appropriately 90% of the time - Needs monitoring or  encouragement for participation or interaction.  Problem Solving Problem solving assist level: Solves basic 90% of the time/requires cueing < 10% of the time  Memory Memory assist level: Recognizes or recalls 75 - 89% of the time/requires cueing 10 - 24% of the time    Pain No/Denies Pain   Therapy/Group: Individual Therapy  Eloni Darius 05/06/2016, 3:42 PM

## 2016-05-06 NOTE — Progress Notes (Signed)
Social Work Patient ID: Roe Coombs, female   DOB: June 06, 1926, 83 y.o.   MRN: [id number]   Lowella Curb, LCSW Social Worker Signed   Patient Care Conference Date of Service: 05/06/2016 11:07 AM      Hide copied text Hover for attribution information Inpatient RehabilitationTeam Conference and Plan of Care Update Date: 05/05/2016   Time: 2:40 PM      Patient Name: Sarah [name]      Medical Record Number: [id number]  Date of Birth: [birth year] Sex: Female         Room/Bed: 4W08C/4W08C-01 Payor Info: Payor: MEDICARE / Plan: MEDICARE PART A AND B / Product Type: *No Product type* /     Admitting Diagnosis: R CVA  Admit Date/Time:  04/30/2016  5:11 PM Admission Comments: No comment available    Primary Diagnosis:  <principal problem not specified> Principal Problem: <principal problem not specified>       Patient Active Problem List    Diagnosis Date Noted  . Acute lower UTI    . Non-intractable vomiting with nausea    . Vascular headache    . Intractable vomiting with nausea    . Slow transit constipation    . Small vessel disease, cerebrovascular 04/30/2016  . Acute CVA (cerebrovascular accident) ([place])    . Dysphagia, post-stroke    . History of cerebrovascular accident (CVA) with residual deficit    . Benign essential HTN    . Slurred speech 04/27/2016  . CVA (cerebral vascular accident) ([place]) 04/27/2016  . Hypertension 04/27/2016  . Dysarthria 04/27/2016  . History of stroke 04/27/2016  . Acute focal neurological deficit 04/27/2016  . Pansinusitis 10/20/2015  . Primary Parkinsonism ([place]) 09/26/2015  . Peripheral edema 05/12/2015  . DOE (dyspnea on exertion) 02/12/2015  . Neck pain 01/10/2015  . Routine general medical examination at a health care facility 09/17/2014  . Hyperglycemia 07/09/2014  . Acute ischemic right MCA stroke ([place]) 07/03/2014  . Degenerative arthritis of knee, bilateral 03/01/2014  . Fall 10/05/2013  . Hyponatremia 10/05/2013  . Memory deficits  10/05/2013  . Arthritis of sacroiliac joint ([place]) 09/11/2013  . Lumbosacral spondylosis without myelopathy 02/26/2013  . Abnormality of gait 12/15/2012  . Right shoulder pain    . Vitamin B12 deficiency 04/13/2012  . Hypercholesterolemia 11/05/2011  . Osteoporosis, postmenopausal 06/02/2011  . Mixed incontinence urge and stress 06/02/2011  . Anxiety    . Edema 04/06/2011  . Tachycardia 04/06/2011  . Osteoarthritis        Expected Discharge Date: Expected Discharge Date: 05/13/16   Team Members Present: Physician leading conference: Dr. Delice Lesch Social Worker Present: Lennart Pall, LCSW Nurse Present: Heather Roberts, RN PT Present: Carney Living, PT;Other (comment) (Rosita Dechalus, PTA) OT Present: Benay Pillow, OT SLP Present: Other (comment) Stormy Fabian, SLP) [place] Coordinator present : Daiva Nakayama, RN, CRRN       Current Status/Progress Goal Weekly Team Focus  Medical     Gait disorder/apraxia with dysphagia/dysarthria secondary to left frontal/ corona radiata infarct secondary to small vessel disease as well as history of CVA in the past with left side mild residual weakness  Improve mobility, safety, transfers, nausea  See above   Bowel/Bladder     Continent of bowel and bladder. LBM 05/05/16  Pt to remain continent of bowel and bladder  Monitor    Swallow/Nutrition/ Hydration     Min A  Mod I  dysphagia 2 with thin liquids   ADL's     Max  A LB self care, Mod A bathing, set up A for grooming and UB self care  supervision overall  self care retraining, participation, balance, functional transfers, pt education   Mobility     min/modA bed mobility, min guard 123ft gait, fair standing balance  supervison with functional mobility   balance, gait, w/c mobilty    Communication     Min A  Supervision  use of speech intelligibility and word finding strategies at the unstructured sentence level   Safety/Cognition/ Behavioral Observations   Min A  Supervision  Use of strategies  to recall new, daily information   Pain     No c/o pain   <3  Monitor for nonverbal cues of pain   Skin     Abrasion proximal to L foot, Tegaderm placed to area for protection, Ecchymosis to BLE, BUE  Monitor for appropriate healing  Assess q shift. Encourage turn q2hrs     Rehab Goals Patient on target to meet rehab goals: Yes *See Care Plan and progress notes for long and short-term goals.   Barriers to Discharge: Mobility, safety, transfers, nausea, headaches, constipation     Possible Resolutions to Barriers:  Therapies, adjusting nausea, headaches, bowel meds     Discharge Planning/Teaching Needs:  Plan for pt to return to her home with additional care being arranged in order to cover 24/7 assistance      Team Discussion:  Nausea and HA appear to be improving.  Cont b/b.  Better strength/ energy in the afternoon.  Currently mod/ max assist with OT.  Goals set for supervision but may decrease LB goals to min assist as caregiver was assisting with ADLs PTA.  Goals at supervision with mobility.    Revisions to Treatment Plan:  None    Continued Need for Acute Rehabilitation Level of Care: The patient requires daily medical management by a physician with specialized training in physical medicine and rehabilitation for the following conditions: Daily direction of a multidisciplinary physical rehabilitation program to ensure safe treatment while eliciting the highest outcome that is of practical value to the patient.: Yes Daily medical management of patient stability for increased activity during participation in an intensive rehabilitation regime.: Yes Daily analysis of laboratory values and/or radiology reports with any subsequent need for medication adjustment of medical intervention for : Neurological problems;Other   Amiley Shishido 05/06/2016, 1:21 PM

## 2016-05-06 NOTE — Progress Notes (Signed)
Subjective/Complaints: Pt laying in bed this AM.  She states she needs to speak with me and then state that something happened to her in therapies yesterday. She asks me to look at "huge bruise" on LLE (relatively minor).  She later is excited about the fact that she had a large BM yesterday and feels better.   ROS: Denies CP, SOB, N/V/D.  Objective: Vital Signs: Blood pressure (!) 128/49, pulse 78, temperature 98.1 F (36.7 C), temperature source Oral, resp. rate 17, height 5' (1.524 m), weight 74.2 kg (163 lb 9.6 oz), SpO2 98 %. No results found. Results for orders placed or performed during the hospital encounter of 04/30/16 (from the past 72 hour(s))  Urinalysis, Routine w reflex microscopic     Status: Abnormal   Collection Time: 05/05/16  5:17 PM  Result Value Ref Range   Color, Urine YELLOW YELLOW   APPearance HAZY (A) CLEAR   Specific Gravity, Urine 1.019 1.005 - 1.030   pH 6.0 5.0 - 8.0   Glucose, UA NEGATIVE NEGATIVE mg/dL   Hgb urine dipstick NEGATIVE NEGATIVE   Bilirubin Urine NEGATIVE NEGATIVE   Ketones, ur 5 (A) NEGATIVE mg/dL   Protein, ur NEGATIVE NEGATIVE mg/dL   Nitrite POSITIVE (A) NEGATIVE   Leukocytes, UA MODERATE (A) NEGATIVE   RBC / HPF 0-5 0 - 5 RBC/hpf   WBC, UA 6-30 0 - 5 WBC/hpf   Bacteria, UA MANY (A) NONE SEEN   Squamous Epithelial / LPF 0-5 (A) NONE SEEN    General: NAD. Vital signs reviewed.  Heart: RRR. No JVD. Lungs: Clear to auscultation, breathing unlabored. Abdomen: Positive bowel sounds, soft Musculoskeletal: No edema. No tenderness Neurologic:  Sensation: Hyperalgesia distal b/l knees Motor: 4+/5 in bilateral deltoid, bicep, tricep, grip, right hip flexor, knee extensors, ankle dorsiflexor and plantar flexor 4+/5 in LLE, hip flexor, knee extensor, ankle dorsiflexor and plantar flexor with some apraxia (stable) B/l LE slightly limited below knees due to neuropathy Skin: No evidence of breakdown, no evidence of rash, minor hematoma distal  LLE Psych: Mood and affect are appropriate  Assessment/Plan: 1. Functional deficits secondary to Left frontal, left corona radiata infarct which require 3+ hours per day of interdisciplinary therapy in a comprehensive inpatient rehab setting. Physiatrist is providing close team supervision and 24 hour management of active medical problems listed below. Physiatrist and rehab team continue to assess barriers to discharge/monitor patient progress toward functional and medical goals. FIM: Function - Bathing Bathing activity did not occur: Refused Position: Sitting EOB Body parts bathed by patient: Right arm, Left arm, Chest, Abdomen, Front perineal area, Right upper leg, Left upper leg Body parts bathed by helper: Buttocks, Right lower leg, Left lower leg, Back Assist Level:  (mod A)  Function- Upper Body Dressing/Undressing Upper body dressing/undressing activity did not occur: Refused What is the patient wearing?: Pull over shirt/dress Pull over shirt/dress - Perfomed by patient: Thread/unthread right sleeve, Thread/unthread left sleeve, Put head through opening, Pull shirt over trunk Assist Level: Set up, Supervision or verbal cues Set up : To obtain clothing/put away Function - Lower Body Dressing/Undressing Lower body dressing/undressing activity did not occur: Refused What is the patient wearing?: Underwear, Non-skid slipper socks Position: Sitting EOB Underwear - Performed by patient: Pull underwear up/down Underwear - Performed by helper: Thread/unthread right underwear leg Non-skid slipper socks- Performed by helper: Don/doff right sock, Don/doff left sock Assist for lower body dressing:  (max A)  Function - Toileting Toileting activity did not occur: No continent bowel/bladder event  Toileting steps completed by patient: Adjust clothing prior to toileting, Performs perineal hygiene, Adjust clothing after toileting Toileting steps completed by helper: Adjust clothing after  toileting Toileting Assistive Devices: Grab bar or rail Assist level: Touching or steadying assistance (Pt.75%)  Function - Air cabin crew transfer activity did not occur: N/A Toilet transfer assistive device: Elevated toilet seat/BSC over toilet, Walker Assist level to toilet: Supervision or verbal cues Assist level from toilet: Supervision or verbal cues  Function - Chair/bed transfer Chair/bed transfer activity did not occur: N/A Chair/bed transfer method: Ambulatory Chair/bed transfer assist level: Touching or steadying assistance (Pt > 75%) Chair/bed transfer assistive device: Environmental consultant, Armrests  Function - Locomotion: Wheelchair Type: Manual Max wheelchair distance: 75 Assist Level: Touching or steadying assistance (Pt > 75%) Assist Level: Touching or steadying assistance (Pt > 75%) Function - Locomotion: Ambulation Assistive device: Walker-rolling Max distance: 125 Assist level: Touching or steadying assistance (Pt > 75%) Assist level: Touching or steadying assistance (Pt > 75%) Assist level: Touching or steadying assistance (Pt > 75%) Walk 150 feet activity did not occur: Safety/medical concerns (per report) Walk 10 feet on uneven surfaces activity did not occur: Safety/medical concerns  Function - Comprehension Comprehension: Auditory Comprehension assist level: Understands complex 90% of the time/cues 10% of the time  Function - Expression Expression: Verbal Expression assist level: Expresses basic 75 - 89% of the time/requires cueing 10 - 24% of the time. Needs helper to occlude trach/needs to repeat words.  Function - Social Interaction Social Interaction assist level: Interacts appropriately 90% of the time - Needs monitoring or encouragement for participation or interaction.  Function - Problem Solving Problem solving assist level: Solves basic 90% of the time/requires cueing < 10% of the time  Function - Memory Memory assist level: Recognizes or  recalls 75 - 89% of the time/requires cueing 10 - 24% of the time Patient normally able to recall (first 3 days only): Current season, Location of own room, Staff names and faces, That he or she is in a hospital    [place] 05/06/2016, 8:30 AM    Medical Problem List and Plan: 1. Gait disorder/apraxia with dysphagia/dysarthriasecondary to left frontal/corona radiata infarct secondary to small vessel disease as well as history of CVA in the past with left side mild residual weakness  Cont CIR 2. Anticoagulation: Subcutaneous Lovenox. Monitor platelet counts and any signs of bleeding 3. Pain Management/hx of chronic low back pain  Cont topiramate started 2/4, improving 4. Mood: Xanax 0.5 mg twice a day, Pamelor 25 mg daily at bedtime, Trilafon 2 mg daily at bedtime, Halcion 0.125 mg bedtime as needed, monitor for oversedation with the addition of topiramate 5. Neuropsych: This patient iscapable of making decisions on herown behalf. 6. Skin/Wound Care: Routine skin checks 7. Fluids/Electrolytes/Nutrition: Routine I&Os 8.Dysphagia. Dysphagia #1 thin liquids. Monitor hydration. Follow-up speech therapy Has hx of esophageal stricture  Cont protonix 9.Parkinson's disease. Sinemet 25-1003 times a day -monitor PD symptoms with therapy/activity and address as indicated.  10.Hyperlipidemia. Pravachol 11. Hypoalbuminemia  Supplement initiated 2/5 12. Nausea  Improving   Zofran increased to 8mg , increased to q4 PRN 2/6  Will cont to monitor 13. Constipation  May also be contributing to nausea  Increased reg again on 2/6  Improving  14. Acute lower UTI  UA+, Ucx pending  Empiric macrobid started 2/8-2/14  LOS (Days): 6 A FACE TO FACE EVALUATION WAS PERFORMED

## 2016-05-06 NOTE — Progress Notes (Signed)
Social Work Patient ID: Sarah [name], female   DOB: 09/03/1926, 83 y.o.   MRN: [id number]   Met with pt and daughter today to review team conference.  Both aware and agreeable to targeted d/c date 2/15 and supervision goals.  Pt pleased with progress and daughter making contact with private duty agencies to add coverage in pt's home.  Continue to follow.  Lexandra Rettke, LCSW

## 2016-05-07 ENCOUNTER — Inpatient Hospital Stay (HOSPITAL_COMMUNITY): Payer: Medicare Other | Admitting: Speech Pathology

## 2016-05-07 ENCOUNTER — Inpatient Hospital Stay (HOSPITAL_COMMUNITY): Payer: Medicare Other

## 2016-05-07 ENCOUNTER — Inpatient Hospital Stay (HOSPITAL_COMMUNITY): Payer: Medicare Other | Admitting: Occupational Therapy

## 2016-05-07 LAB — URINE CULTURE: Culture: >=100000 — AB

## 2016-05-07 LAB — CREATININE, SERUM
Creatinine, Ser: 0.78 mg/dL (ref 0.44–1.00)
GFR calc non Af Amer: >60 mL/min (ref >60)

## 2016-05-07 NOTE — Progress Notes (Signed)
Occupational Therapy Session Note  Patient Details  Name: Sarah [name] MRN: [id number] Date of Birth: 15-Jun-1926  Today's Date: 05/07/2016 OT Individual Time: 0856-1000 OT Individual Time Calculation (min): 64 min    Short Term Goals: Week 1:  OT Short Term Goal 1 (Week 1): Pt will perform toileting with min A for balance during clothing management and hygiene. OT Short Term Goal 2 (Week 1): Pt will perform shower transfer with min A and use of DME as needed. OT Short Term Goal 3 (Week 1): Pt will engage in 10 minutes of functional tasks with 2 or less rest breaks.  OT Short Term Goal 4 (Week 1): Pt will perform LB dressing with mod A in order to increase I with LB self care.       Skilled Therapeutic Interventions/Progress Updates: Pt was lying in bed at time of arrival, agreeable to session with encouragement. Tx focus on activity tolerance, sit<stands, balance, and bilateral UE strengthening. Pt reported needing to void and was adamant regarding using BSC instead of ambulating into bathroom. Sit<stand and functional transfer with RW completed with Min A. Toileting tasks completed with Min A for pericare thoroughness. Afterwards pt was agreeable to sponge bathe at sink (declined shower due to fatigue). She required overall Mod A for bathing with extra time and safety cues during sit<stand transitions. New clothes had been selected out of closet but pt reported feeling too fatigued to don them. She was adamant about putting her pajamas back on and she required Min A to do so. She then ambulated short distance back to bed with Min A and RW. Mod A for sit<supine. Pt was then repositioned for comfort and left with all needs within reach at time of departure, bed alarm activated.       Therapy Documentation Precautions:  Precautions Precautions: Fall Restrictions Weight Bearing Restrictions: No General:   Pain: No c/o pain during session  Pain Assessment Pain Score: 2  ADL:      See  Function Navigator for Current Functional Status.   Therapy/Group: Individual Therapy  Yuji Walth A Erbie Arment 05/07/2016, 12:50 PM

## 2016-05-07 NOTE — Progress Notes (Signed)
Physical Therapy Session Note  Patient Details  Name: Sarah [name] MRN: [id number] Date of Birth: January 05, 1927  Today's Date: 05/07/2016 PT Individual Time: 1330-1430 PT Individual Time Calculation (min): 60 min   Short Term Goals: Week 1:  PT Short Term Goal 1 (Week 1): Patient will consistently perform transfers with min assist  PT Short Term Goal 2 (Week 1): Patient will initiate stait training PT Short Term Goal 3 (Week 1): Patient will ambulate 66ft with min assist and Rw  PT Short Term Goal 4 (Week 1): Patient will perform bed mobility with Min assist  PT Short Term Goal 5 (Week 1): Paitent will propell WC 136ft with supervision assist.   Skilled Therapeutic Interventions/Progress Updates:    Patient seen at bedside with caregiver present for vestibular assessment.  See below for objective details.  Noted some decline in BP with position changes, but not > 20 mmHG systolic and rebounds as remains standing.  Did note some elevation of R eye in orbit suggestive of ocular tilt abnormality as well as positive head thrust test to R with refixation saccade.  Feel symptoms consistent with R vestibular hypofunction and initiated seated x1 gaze stability training for vestibular adaptation.   However, pt also relates recent dx of UTI and started on antibiotic therapy today.  States feels overall horrible and dizzy symptoms not as noxious as just feeling poorly overall.  Hopes tx of infection will clear symptoms as well.      05/07/16 0001  Vestibular Assessment  General Observation Noted R eye higher in orbit than L.  Reports both eyes s/p cataract surgery R eye in Jan this year.  States dizziness began since admission on rehab.  Describes as light headed at times, nausea, headache and just today spinning sitting on EOB after back from bathroom.  Symptom Behavior  Type of Dizziness Lightheadedness  Frequency of Dizziness intermittent  Duration of Dizziness minutes  Aggravating Factors Activity  in general  Relieving Factors Rest  Occulomotor Exam  Occulomotor Alignment Abnormal (R eye higher in orbit than L, but other oculomotor WNL)  Spontaneous Absent  Gaze-induced Right beating nystagmus with R gaze ( 2 beats only)  Head shaking Horizontal Absent  Head Shaking Vertical Absent  Smooth Pursuits Intact  Saccades Intact  Vestibulo-Occular Reflex  VOR 1 Head Only (x 1 viewing) seated VOR horizontal and vertical x 30 seconds or more no difficulties  VOR to Slow Head Movement Positive right;Negative left  VOR Cancellation Normal  Auditory  Comments intact and equal to scratch test bilaterally  Positional Testing  Sidelying Test Sidelying Right;Sidelying Left  Horizontal Canal Testing Horizontal Canal Right;Horizontal Canal Left  Sidelying Right  Sidelying Right Duration 30 seconds  Sidelying Right Symptoms No nystagmus  Sidelying Left  Sidelying Left Duration 30 sec  Sidelying Left Symptoms No nystagmus  Horizontal Canal Right  Horizontal Canal Right Duration 30 sec  Horizontal Canal Right Symptoms Normal  Horizontal Canal Left  Horizontal Canal Left Duration 30 sec  Horizontal Canal Left Symptoms Normal  Orthostatics  BP supine (x 5 minutes) 112/46  HR supine (x 5 minutes) 71  BP sitting 103/51  HR sitting 79  BP standing (after 1 minute) 101/41  HR standing (after 1 minute) 79  BP standing (after 3 minutes) 123/45  HR standing (after 3 minutes) 83  Orthostatics Comment initially symptomatice with nausea and feeling bad, but resolved when remained standing    Therapy Documentation Precautions:  Precautions Precautions: Fall Restrictions Weight Bearing Restrictions:  No   See Function Navigator for Current Functional Status.   Therapy/Group: Individual Therapy  Reginia Naas  [place], [place] 05/07/2016  05/07/2016, 5:36 PM

## 2016-05-07 NOTE — Progress Notes (Signed)
Expand All Collapse All   [] Hide copied text [] Hover for attribution information      Physical Medicine and Rehabilitation Consult Reason for Consult: Acute early subacute infarct left frontal centrum semi-ovale Referring Physician: [facility]   HPI: Sarah [name] is a 83 y.o. right handed female with history of Parkinson's disease maintained on Sinemet, CVA with residual left-sided weakness maintained on Plavix. Per chart review and patient's son/daughter, and patient, patient lives alone. Used an assistive device prior to admission. She has a home health aide 10-6 7 days a week, recently changed to 10-4. She needed assistance with some dressing and bathing. She does not donn / doff her socks or shoes independently or drive or get her groceries.She was receiving HH therapies prior to admission. One level home no steps to entry. Presented 04/27/2016 with slurred speech. She was hypertensive 190/70. Mild hyponatremia 132. Troponin negative. Cranial CT scan reviewed, unremarkable for acute process. She did not receive TPA. MRI showed acute early subacute infarct within the left frontal centrum semiovale. No acute hemorrhage. MRA with no evidence of large vessel occlusion or aneurysm. Carotid Dopplers with no ICA stenosis. Echocardiogram is pending. Neurology consulted presently remains on Plavix for CVA prophylaxis. Subcutaneous Lovenox for DVT prophylaxis. Currently on a mechanical soft diet. Physical therapy evaluation completed with recommendations of physical medicine rehabilitation consult.   Review of Systems  Constitutional: Negative for chills and fever.  HENT: Negative for hearing loss and tinnitus.   Eyes: Negative for blurred vision and double vision.  Respiratory: Negative for cough and shortness of breath.   Cardiovascular: Negative for chest pain and palpitations.  Gastrointestinal: Positive for constipation. Negative for nausea and vomiting.       GERD  Genitourinary:  Negative for hematuria.       Bladder incontinence  Musculoskeletal: Positive for joint pain and myalgias.  Skin: Negative for rash.  Neurological: Positive for dizziness, tingling, speech change, focal weakness and weakness. Negative for sensory change.  Psychiatric/Behavioral: Positive for depression. The patient has insomnia.        Anxiety  All other systems reviewed and are negative.      Past Medical History:  Diagnosis Date  . Bladder incontinence   . Chest pain    S/P nuclear 07/17/2008  normal; EF 88% with no ischemia  . Chronic venous insufficiency    BLE edema, chronic, neg venous US 2/13  . Constipation    hx of  . Depression   . Diverticulitis   . Esophageal stricture    s/p dilation 04/2012  . GERD (gastroesophageal reflux disease)   . History of chicken pox   . Hyperlipidemia   . Hypertension   . Osteoarthritis   . Osteoporosis, postmenopausal 06/02/2011  . Polyp of colon   . Stroke [facility]) 07/03/2014        Past Surgical History:  Procedure Laterality Date  . APPENDECTOMY     age 40  . COLONOSCOPY    . POLYPECTOMY    . TUBAL LIGATION    . UPPER GASTROINTESTINAL ENDOSCOPY           Family History  Problem Relation Age of Onset  . Cancer Son     testicular cancer  . Breast cancer Sister   . Skin cancer Sister   . Colon cancer Neg Hx   . Esophageal cancer Neg Hx   . Rectal cancer Neg Hx   . Stomach cancer Neg Hx    Social History:  reports that she has  never smoked. She has never used smokeless tobacco. She reports that she does not drink alcohol or use drugs. Allergies:       Allergies  Allergen Reactions  . Lipitor [Atorvastatin Calcium] Other (See Comments)    Leg weakness,memory loss  . Lovastatin Other (See Comments)    Leg weakness  . Celebrex [Celecoxib] Itching and Rash  . Latex Rash  . Macrobid [employer] Macro] Other (See Comments)    Unknown per patient  . Other Other (See  Comments)    Always pads            Facility-Administered Medications Prior to Admission  Medication Dose Route Frequency Provider Last Rate Last Dose  . cyanocobalamin ((VITAMIN B-12)) injection 1,000 mcg  1,000 mcg Intramuscular Q30 days Binnie Rail, MD   1,000 mcg at 06/27/15 1545         Medications Prior to Admission  Medication Sig Dispense Refill  . acetaminophen (TYLENOL) 500 MG tablet Take 1 tablet (500 mg total) by mouth every 8 (eight) hours as needed (for pain). 180 tablet 2  . ALPRAZolam (XANAX) 1 MG tablet Take 1 tablet (1 mg total) by mouth 2 (two) times daily. 60 tablet 1  . carbidopa-levodopa (SINEMET IR) 25-100 MG tablet TAKE 1 TABLET 3 TIMES A DAY. 90 tablet 3  . Cholecalciferol (VITAMIN D PO) Take 1 tablet by mouth daily.     . clopidogrel (PLAVIX) 75 MG tablet Take 1 tablet (75 mg total) by mouth daily. 90 tablet 2  . diclofenac sodium (VOLTAREN) 1 % GEL Apply  To feet 3 times daily prn for pain 1 Tube 5  . furosemide (LASIX) 20 MG tablet TAKE 1 TABLET DAILY AS NEEDED. (Patient taking differently: TAKE 1 TABLET DAILY AS NEEDED FOR EDEMA) 30 tablet 0  . Glucosamine HCl 750 MG TABS Take 1 tablet by mouth 2 (two) times daily.     Marland Kitchen HALCION 0.25 MG tablet TAKE 1/2 TABLET AT BEDTIME AS NEEDED FOR SLEEP. (Patient taking differently: TAKE 0.125mg  AT BEDTIME FOR SLEEP.) 45 tablet 0  . Homeopathic Products (ARNICARE ARNICA EX) Apply 1 application topically daily as needed (pain).    Marland Kitchen ibuprofen (ADVIL,MOTRIN) 200 MG tablet Take 200 mg by mouth every 6 (six) hours as needed for headache.    . nitroGLYCERIN (NITROSTAT) 0.4 MG SL tablet Place 1 tablet (0.4 mg total) under the tongue every 5 (five) minutes as needed for chest pain. 50 tablet prn  . nortriptyline (PAMELOR) 25 MG capsule TAKE 1 CAPSULE AT BEDTIME. 30 capsule 3  . ofloxacin (OCUFLOX) 0.3 % ophthalmic solution Place 1 drop into the right eye 4 (four) times daily.    . pantoprazole (PROTONIX) 40 MG tablet  TAKE 1 TABLET ONCE DAILY. 30 tablet 11  . perphenazine (TRILAFON) 2 MG tablet Take 1 tablet (2 mg total) by mouth at bedtime. 30 tablet 2  . pravastatin (PRAVACHOL) 20 MG tablet TAKE (1/2) TABLET DAILY. 45 tablet 3  . prednisoLONE acetate (PRED FORTE) 1 % ophthalmic suspension Place 1 drop into the right eye every 2 (two) hours while awake.    . VESICARE 10 MG tablet TAKE (1) TABLET DAILY AS NEEDED. (Patient taking differently: TAKE (1) TABLET DAILY AS NEEDED FOR BLADDER) 30 tablet 0    Home: Home Living Family/patient expects to be discharged to:: Private residence Living Arrangements: Alone Available Help at Discharge: Home health, Other (Comment) (aide 10-6 7 days a week) Type of Home: House Home Access: Stairs to enter  Entrance Stairs-Number of Steps: 1 Entrance Stairs-Rails: None Home Layout: Two level, Able to live on main level with bedroom/bathroom Alternate Level Stairs-Number of Steps: stays on main Bathroom Shower/Tub: Multimedia programmer: Standard Home Equipment: Environmental consultant - 4 wheels, Shower seat  Functional History: Prior Function Level of Independence: Needs assistance Gait / Transfers Assistance Needed: Used rollator ADL's / Homemaking Assistance Needed: Needs assistance with dressing/bathing. Does not don/doff socks and shoes independently. Functional Status:  Mobility: Bed Mobility Overal bed mobility: Needs Assistance Bed Mobility: Supine to Sit, Sit to Supine Supine to sit: Mod assist Sit to supine: Mod assist General bed mobility comments: cues to use railing, assist for lifting trunk, assist to supine for legs into bed Transfers Overall transfer level: Needs assistance Equipment used: 4-wheeled walker Transfers: Sit to/from Stand Sit to Stand: Mod assist General transfer comment: lifting assist with increased time for initiation,  Ambulation/Gait Ambulation/Gait assistance: Min assist, Mod assist Ambulation Distance (Feet): 12 Feet Assistive  device: 4-wheeled walker Gait Pattern/deviations: Step-through pattern, Shuffle, Decreased stride length General Gait Details: assist for weight shifts to initiate stepping, reports L LE feels weak like could give away, flexed posture, assist to move walker and keep her close to it  ADL: ADL Overall ADL's : Needs assistance/impaired Eating/Feeding: NPO Grooming: Minimal assistance, Sitting Upper Body Bathing: Minimal assistance, Sitting Lower Body Bathing: Maximal assistance, Sit to/from stand Upper Body Dressing : Minimal assistance, Sitting Lower Body Dressing: Maximal assistance, Sit to/from stand Toilet Transfer Details (indicate cue type and reason): Pt only able to tolerate sit<>stand with max assist this session due to reports of feeling weak.  General ADL Comments: Pt reporting feeling weak due to not eating. Significantly decreased activity tolerance. Although she requires assistance with ADL at baseline, she demonstrates functional decline with ADL at current level.  Cognition: Cognition Overall Cognitive Status: Impaired/Different from baseline Orientation Level: Oriented X4 Cognition Arousal/Alertness: Awake/alert Behavior During Therapy: WFL for tasks assessed/performed Overall Cognitive Status: Impaired/Different from baseline Area of Impairment: Problem solving Problem Solving: Slow processing General Comments: Slow processing with ADL tasks but alert and oriented.   Blood pressure (!) 140/49, pulse 84, temperature 97.6 F (36.4 C), temperature source Oral, resp. rate 16, height 5' (1.524 m), weight 75.5 kg (166 lb 6.4 oz), SpO2 95 %. Physical Exam  Vitals reviewed. Constitutional: She is oriented to person, place, and time. She appears well-developed and well-nourished.  HENT:  Head: Normocephalic and atraumatic.  Eyes: Conjunctivae and EOM are normal.  Neck: Normal range of motion. Neck supple. No thyromegaly present.  Cardiovascular: Normal rate and regular  rhythm.   Respiratory: Effort normal and breath sounds normal. No respiratory distress.  GI: Soft. Bowel sounds are normal. She exhibits no distension.  Musculoskeletal: She exhibits tenderness. She exhibits no edema.  Neurological: She is alert and oriented to person, place, and time.  Speech is dysarthric but intelligible.  She follows simple commands Mild left facial droop Motor: B/l UE 5/5 proximal to distal RLE: 4/5 proximal to distal (pain inhibition) LLE: 4-/5 proxima to distal with apraxia (pain inhibition) Sensation intact to light touch DTRs symmetric  Skin: Skin is warm and dry.  Psychiatric: She has a normal mood and affect. Her behavior is normal.     Assessment/Plan: Diagnosis: Acute early subacute infarct left frontal centrum semi-ovale Labs and images independently reviewed.  Records reviewed and summated above. Stroke: Continue secondary stroke prophylaxis and Risk Factor Modification listed below:   Antiplatelet therapy:   Blood Pressure Management:  Continue current medication with prn's with permisive HTN per primary team Statin Agent:   Motor recovery: Fluoxetine  1. Does the need for close, 24 hr/day medical supervision in concert with the patient's rehab needs make it unreasonable for this patient to be served in a less intensive setting? Yes  2. Co-Morbidities requiring supervision/potential complications: dysphagia (advance diet as tolerated), Parkinson's disease (cont meds, cont therapies), CVA with residual left-sided weakness, HTN (monitor and provide prns in accordance with increased physical exertion and pain), hyponatremia (cont to monitor, treat if necessary), anxiety (prn meds) 3. Due to safety, disease management, medication administration and patient education, does the patient require 24 hr/day rehab nursing? Yes 4. Does the patient require coordinated care of a physician, rehab nurse, PT (1-2 hrs/day, 5 days/week), OT (1-2 hrs/day, 5 days/week) and  SLP (1-2 hrs/day, 5 days/week) to address physical and functional deficits in the context of the above medical diagnosis(es)? Yes Addressing deficits in the following areas: balance, endurance, locomotion, strength, transferring, bathing, dressing, grooming, toileting, speech, swallowing and psychosocial support 5. Can the patient actively participate in an intensive therapy program of at least 3 hrs of therapy per day at least 5 days per week? Potentially 6. The potential for patient to make measurable gains while on inpatient rehab is excellent 7. Anticipated functional outcomes upon discharge from inpatient rehab are min assist  with PT, min assist with OT, modified independent and supervision with SLP. 8. Estimated rehab length of stay to reach the above functional goals is: 12-15 days. 9. Does the patient have adequate social supports and living environment to accommodate these discharge functional goals? Potentially 10. Anticipated D/C setting: Home 11. Anticipated post D/C treatments: HH therapy and Home excercise program 12. Overall Rehab/Functional Prognosis: good and fair  RECOMMENDATIONS: This patient's condition is appropriate for continued rehabilitative care in the following setting: Pt will require increase in assistance after discharge.  Further, she may not be that far from her baseline. If this is the case, recommend SNF for less intense and prlonged course of therapies with PM&R outpt follow up. Patient has agreed to participate in recommended program. Potentially Note that insurance prior authorization may be required for reimbursement for recommended care.  Comment: Rehab Admissions Coordinator to follow up.  Delice Lesch, MD, Mellody Drown Cathlyn Parsons., PA-C 04/28/2016    Revision History                        Routing History

## 2016-05-07 NOTE — Progress Notes (Signed)
Speech Language Pathology Weekly Progress and Session Note  Patient Details  Name: Sarah [name] MRN: [id number] Date of Birth: 1926/09/12  Beginning of progress report period: April 30, 2016 End of progress report period: May 07, 2016  Today's Date: 05/07/2016 SLP Individual Time: 1000-1030 SLP Individual Time Calculation (min): 30 min  Short Term Goals: Week 1: SLP Short Term Goal 1 (Week 1): Patient will consume trials of Dys.2 textures, with Mod I recall to alternate solids and liquids to effectively prevent s/s of regurgitation.  SLP Short Term Goal 1 - Progress (Week 1): Not met SLP Short Term Goal 2 (Week 1): Patient will utilize word finding strategies in strucutred tasks with Mod assist question cues.  SLP Short Term Goal 2 - Progress (Week 1): Met SLP Short Term Goal 3 (Week 1): Patient will utilize comepsatory strategies for short term recall of new and daily information with Mod assist question cues.  SLP Short Term Goal 3 - Progress (Week 1): Met    New Short Term Goals: Week 2: SLP Short Term Goal 1 (Week 2): Patient will consume trials of Dys.2 textures over 2 sessions, with Mod I recall to alternate solids and liquids to effectively prevent s/s of regurgitation prior to upgrade.  SLP Short Term Goal 2 (Week 2): Patient will consume current diet without overt s/s of aspiration and Mod I for use of swallowing compenstory strategies.  SLP Short Term Goal 3 (Week 2): Patient will utilize speech intelligibility strategies at the sentence level with supervision verbal cues.  SLP Short Term Goal 4 (Week 2): Patient will utilize comepsatory strategies for short term recall of new and daily information with Min assist question cues.   Weekly Progress Updates: Patient has made functional gains and has met 2 of 3 STG's this reporting period. Currently, patient is consuming Dys. 1 textures with thin liquids with minimal overt s/s of aspiration and requires intermittent supervision  verbal cues for use of swallowing compensatory strategies. Patient is also consuming trials of Dys. 2 textures with intermittent globus sensation, recommend ongoing trials with SLP at this time. Patient requires intermittent supervision-Min A verbal cues for word-finding and supervision verbal cues for use of speech intelligibility strategies at the conversation level. Patient also requires Min-Mod A verbal cues for recall of new information. Function is impacted by fatigue. Patient would benefit from continued skilled SLP intervention to maximize her swallowing and cognitive functional and functional independence prior to discharge.      Intensity: Minumum of 1-2 x/day, 30 to 90 minutes Frequency: 3 to 5 out of 7 days Duration/Length of Stay: 2/15 Treatment/Interventions: Cognitive remediation/compensation;Cueing hierarchy;Dysphagia/aspiration precaution training;Environmental controls;Functional tasks;Medication managment;Patient/family education;Speech/Language facilitation   Daily Session  Skilled Therapeutic Interventions: Skilled treatment session focused on dysphagia and speech goals. Patient consumed trials of Dys. 2 textures without overt s/s of aspiration or regurgitation but reported a mild globus sensation that cleared with a liquid wash and multiple swallows. Recommend continued trials with SLP. Patient with increased difficulty with word-finding and decreased recall of new information. Patient with increased awareness of this and reported she felt it might be due to "the infection." SLP also suspects fatigue. Patient left upright in bed with all needs within reach. Continue with current plan of care.      Function:   Eating Eating   Modified Consistency Diet: Yes Eating Assist Level: Supervision or verbal cues;Set up assist for   Eating Set Up Assist For: Opening containers       Cognition  Comprehension Comprehension assist level: Understands complex 90% of the time/cues 10%  of the time  Expression   Expression assist level: Expresses basic 75 - 89% of the time/requires cueing 10 - 24% of the time. Needs helper to occlude trach/needs to repeat words.  Social Interaction Social Interaction assist level: Interacts appropriately 90% of the time - Needs monitoring or encouragement for participation or interaction.  Problem Solving Problem solving assist level: Solves basic 90% of the time/requires cueing < 10% of the time  Memory Memory assist level: Recognizes or recalls 75 - 89% of the time/requires cueing 10 - 24% of the time   Pain No/Denies Pain   Therapy/Group: Individual Therapy  Sarah [name] 05/07/2016, 3:57 PM

## 2016-05-07 NOTE — Plan of Care (Signed)
Problem: RH Wheelchair Mobility Goal: LTG Patient will propel w/c in controlled environment (PT) LTG: Patient will propel wheelchair in controlled environment, # of feet with assist (PT)  Outcome: Not Applicable Date Met: 22/30/09 D/c due to pt primary ambulator. ABG Goal: LTG Patient will propel w/c in home environment (PT) LTG: Patient will propel wheelchair in home environment, # of feet with assistance (PT).  Outcome: Not Applicable Date Met: 79/49/97 D/c due to primary ambulator. ABG

## 2016-05-07 NOTE — Plan of Care (Signed)
Problem: RH SAFETY Goal: RH STG ADHERE TO SAFETY PRECAUTIONS W/ASSISTANCE/DEVICE STG Adhere to Safety Precautions with Supervision and appropriate assistive Device. Patient will demonstrate knowledge of use of nurse call bell when needing assistance   Outcome: Progressing Uses call light appropriately for assistance.

## 2016-05-07 NOTE — Progress Notes (Signed)
Subjective/Complaints: Pt seen laying in bed this AM.  She states she is "burnt out".  She also states she needs me to exam her chest.  She states she feels like she is getting sick.    ROS: Denies CP, SOB, N/V/D.  Objective: Vital Signs: Blood pressure 129/69, pulse 78, temperature 98.6 F (37 C), temperature source Oral, resp. rate 17, height 5' (1.524 m), weight 74.2 kg (163 lb 9.6 oz), SpO2 98 %. No results found. Results for orders placed or performed during the hospital encounter of 04/30/16 (from the past 72 hour(s))  Urine culture     Status: Abnormal   Collection Time: 05/05/16  5:17 PM  Result Value Ref Range   Specimen Description URINE, RANDOM    Special Requests NONE    Culture >=100,000 COLONIES/mL ESCHERICHIA COLI (A)    Report Status 05/07/2016 FINAL    Organism ID, Bacteria ESCHERICHIA COLI (A)       Susceptibility   Escherichia coli - MIC*    AMPICILLIN 16 INTERMEDIATE Intermediate     CEFAZOLIN <=4 SENSITIVE Sensitive     CEFTRIAXONE <=1 SENSITIVE Sensitive     CIPROFLOXACIN >=4 RESISTANT Resistant     GENTAMICIN <=1 SENSITIVE Sensitive     IMIPENEM <=0.25 SENSITIVE Sensitive     NITROFURANTOIN <=16 SENSITIVE Sensitive     TRIMETH/SULFA <=20 SENSITIVE Sensitive     AMPICILLIN/SULBACTAM <=2 SENSITIVE Sensitive     PIP/TAZO <=4 SENSITIVE Sensitive     Extended ESBL NEGATIVE Sensitive     * >=100,000 COLONIES/mL ESCHERICHIA COLI  Urinalysis, Routine w reflex microscopic     Status: Abnormal   Collection Time: 05/05/16  5:17 PM  Result Value Ref Range   Color, Urine YELLOW YELLOW   APPearance HAZY (A) CLEAR   Specific Gravity, Urine 1.019 1.005 - 1.030   pH 6.0 5.0 - 8.0   Glucose, UA NEGATIVE NEGATIVE mg/dL   Hgb urine dipstick NEGATIVE NEGATIVE   Bilirubin Urine NEGATIVE NEGATIVE   Ketones, ur 5 (A) NEGATIVE mg/dL   Protein, ur NEGATIVE NEGATIVE mg/dL   Nitrite POSITIVE (A) NEGATIVE   Leukocytes, UA MODERATE (A) NEGATIVE   RBC / HPF 0-5 0 - 5 RBC/hpf   WBC, UA 6-30 0 - 5 WBC/hpf   Bacteria, UA MANY (A) NONE SEEN   Squamous Epithelial / LPF 0-5 (A) NONE SEEN  Creatinine, serum     Status: None   Collection Time: 05/07/16  5:46 AM  Result Value Ref Range   Creatinine, Ser 0.78 0.44 - 1.00 mg/dL   GFR calc non Af Amer >60 >60 mL/min   GFR calc Af Amer >60 >60 mL/min    Comment: (NOTE) The eGFR has been calculated using the CKD EPI equation. This calculation has not been validated in all clinical situations. eGFR's persistently <60 mL/min signify possible Chronic Kidney Disease.     General: NAD. Vital signs reviewed.  Heart: RRR. No JVD. Lungs: Clear to auscultation, breathing unlabored. Abdomen: Positive bowel sounds, soft Musculoskeletal: No edema. No tenderness Neurologic:  Sensation: Hyperalgesia distal b/l knees Motor: 4+/5 in bilateral deltoid, bicep, tricep, grip, right hip flexor, knee extensors, ankle dorsiflexor and plantar flexor 4+/5 in LLE, hip flexor, knee extensor, ankle dorsiflexor and plantar flexor with some apraxia (unchanged) B/l LE slightly limited below knees due to neuropathy Skin: No evidence of breakdown, no evidence of rash, minor hematoma distal LLE, healing Psych: Mood and affect are appropriate  Assessment/Plan: 1. Functional deficits secondary to Left frontal, left corona radiata  infarct which require 3+ hours per day of interdisciplinary therapy in a comprehensive inpatient rehab setting. Physiatrist is providing close team supervision and 24 hour management of active medical problems listed below. Physiatrist and rehab team continue to assess barriers to discharge/monitor patient progress toward functional and medical goals. FIM: Function - Bathing Bathing activity did not occur: Refused Position: Sitting EOB Body parts bathed by patient: Right arm, Left arm, Chest, Abdomen, Front perineal area, Right upper leg, Left upper leg Body parts bathed by helper: Buttocks, Right lower leg, Left lower leg,  Back Assist Level:  (mod A)  Function- Upper Body Dressing/Undressing Upper body dressing/undressing activity did not occur: Refused What is the patient wearing?: Pull over shirt/dress Pull over shirt/dress - Perfomed by patient: Thread/unthread right sleeve, Thread/unthread left sleeve, Put head through opening, Pull shirt over trunk Assist Level: Set up, Supervision or verbal cues Set up : To obtain clothing/put away Function - Lower Body Dressing/Undressing Lower body dressing/undressing activity did not occur: Refused What is the patient wearing?: Underwear, Non-skid slipper socks Position: Sitting EOB Underwear - Performed by patient: Pull underwear up/down Underwear - Performed by helper: Thread/unthread right underwear leg Non-skid slipper socks- Performed by helper: Don/doff right sock, Don/doff left sock Assist for lower body dressing:  (max A)  Function - Toileting Toileting activity did not occur: No continent bowel/bladder event Toileting steps completed by patient: Adjust clothing prior to toileting, Performs perineal hygiene, Adjust clothing after toileting Toileting steps completed by helper: Adjust clothing after toileting Toileting Assistive Devices: Grab bar or rail Assist level: Touching or steadying assistance (Pt.75%)  Function - Air cabin crew transfer activity did not occur: N/A Toilet transfer assistive device: Elevated toilet seat/BSC over toilet, Walker Assist level to toilet: Supervision or verbal cues Assist level from toilet: Supervision or verbal cues  Function - Chair/bed transfer Chair/bed transfer activity did not occur: N/A Chair/bed transfer method: Ambulatory Chair/bed transfer assist level: Supervision or verbal cues Chair/bed transfer assistive device: Armrests, Environmental consultant (rollator)  Function - Locomotion: Wheelchair Type: Manual Max wheelchair distance: 75 Assist Level: Touching or steadying assistance (Pt > 75%) Assist Level:  Touching or steadying assistance (Pt > 75%) Function - Locomotion: Ambulation Assistive device: Other (comment) (rollator) Max distance: 130' Assist level: Supervision or verbal cues Assist level: Supervision or verbal cues Assist level: Supervision or verbal cues Walk 150 feet activity did not occur: Safety/medical concerns (per report) Assist level: Supervision or verbal cues Walk 10 feet on uneven surfaces activity did not occur: Safety/medical concerns Assist level: Supervision or verbal cues  Function - Comprehension Comprehension: Auditory Comprehension assist level: Understands complex 90% of the time/cues 10% of the time  Function - Expression Expression: Verbal Expression assist level: Expresses basic 75 - 89% of the time/requires cueing 10 - 24% of the time. Needs helper to occlude trach/needs to repeat words.  Function - Social Interaction Social Interaction assist level: Interacts appropriately 90% of the time - Needs monitoring or encouragement for participation or interaction.  Function - Problem Solving Problem solving assist level: Solves basic 90% of the time/requires cueing < 10% of the time  Function - Memory Memory assist level: Recognizes or recalls 75 - 89% of the time/requires cueing 10 - 24% of the time Patient normally able to recall (first 3 days only): Current season, Location of own room, Staff names and faces, That he or she is in a hospital    [place] 05/07/2016, 8:49 AM    Medical Problem List and Plan:  1. Gait disorder/apraxia with dysphagia/dysarthriasecondary to left frontal/corona radiata infarct secondary to small vessel disease as well as history of CVA in the past with left side mild residual weakness  Cont CIR 2. Anticoagulation: Subcutaneous Lovenox. Monitor platelet counts and any signs of bleeding 3. Pain Management/hx of chronic low back pain  Cont topiramate started 2/4, improving 4. Mood: Xanax 0.5 mg twice a day,  Pamelor 25 mg daily at bedtime, Trilafon 2 mg daily at bedtime, Halcion 0.125 mg bedtime as needed, monitor for oversedation with the addition of topiramate 5. Neuropsych: This patient iscapable of making decisions on herown behalf. 6. Skin/Wound Care: Routine skin checks 7. Fluids/Electrolytes/Nutrition: Routine I&Os 8.Dysphagia. Dysphagia #1 thin liquids. Monitor hydration. Follow-up speech therapy Has hx of esophageal stricture  Cont protonix 9.Parkinson's disease. Sinemet 25-1003 times a day -monitor PD symptoms with therapy/activity and address as indicated.  10.Hyperlipidemia. Pravachol 11. Hypoalbuminemia  Supplement initiated 2/5 12. Nausea  Continues to improve  Zofran increased to 26m, increased to q4 PRN 2/6  Will cont to monitor 13. Constipation  May also be contributing to nausea  Increased reg again on 2/6  Improving  14. Acute lower UTI  UA+, Ucx E. Coli  Macrobid 2/8-2/14  LOS (Days): 7 A FACE TO FACE EVALUATION WAS PERFORMED

## 2016-05-08 ENCOUNTER — Inpatient Hospital Stay (HOSPITAL_COMMUNITY): Payer: Medicare Other | Admitting: Physical Therapy

## 2016-05-08 ENCOUNTER — Inpatient Hospital Stay (HOSPITAL_COMMUNITY): Payer: Medicare Other | Admitting: Occupational Therapy

## 2016-05-08 ENCOUNTER — Inpatient Hospital Stay (HOSPITAL_COMMUNITY): Payer: Medicare Other | Admitting: Speech Pathology

## 2016-05-08 NOTE — Progress Notes (Signed)
Speech Language Pathology Daily Session Note  Patient Details  Name: Sarah [name] MRN: [id number] Date of Birth: [birth year]  Today's Date: 05/08/2016 SLP Individual Time: 1345-1430 SLP Individual Time Calculation (min): 45 min  Short Term Goals: Week 2: SLP Short Term Goal 1 (Week 2): Patient will consume trials of Dys.2 textures over 2 sessions, with Mod I recall to alternate solids and liquids to effectively prevent s/s of regurgitation prior to upgrade.  SLP Short Term Goal 2 (Week 2): Patient will consume current diet without overt s/s of aspiration and Mod I for use of swallowing compenstory strategies.  SLP Short Term Goal 3 (Week 2): Patient will utilize speech intelligibility strategies at the sentence level with supervision verbal cues.  SLP Short Term Goal 4 (Week 2): Patient will utilize comepsatory strategies for short term recall of new and daily information with Min assist question cues.   Skilled Therapeutic Interventions: Skilled treatment session focused dysphagia goals and use of speech intelligibility strategies. SLP provided skilled observation of puree lunch tray supplemented with dysphagia 2 items. Pt consumed dysphagia 1 and dysphagia 2 trials without overt s/s of aspiration or report of globus sensation. Pt with appropriate rate but needed Min A verbal cues for alternation of liquids with dysphagia 2 items. Pt was Mod I when communicating at the sentence level to achieve ~100 intelligibility. Pt was returned room, left with son and all needs within reach. Continue per current plan of care.      Function:  Eating Eating   Modified Consistency Diet: Yes Eating Assist Level: Supervision or verbal cues;Set up assist for           Cognition Comprehension Comprehension assist level: Understands complex 90% of the time/cues 10% of the time  Expression   Expression assist level: Expresses basic 75 - 89% of the time/requires cueing 10 - 24% of the time. Needs helper to  occlude trach/needs to repeat words.  Social Interaction Social Interaction assist level: Interacts appropriately 90% of the time - Needs monitoring or encouragement for participation or interaction.  Problem Solving Problem solving assist level: Solves basic 90% of the time/requires cueing < 10% of the time  Memory Memory assist level: Recognizes or recalls 75 - 89% of the time/requires cueing 10 - 24% of the time    Pain Pain Assessment Pain Assessment: No/denies pain  Therapy/Group: Individual Therapy  Markala Sitts 05/08/2016, 2:11 PM

## 2016-05-08 NOTE — Progress Notes (Signed)
Occupational Therapy Session Note  Patient Details  Name: Sarah [name] MRN: [id number] Date of Birth: September 29, 1926  Today's Date: 05/08/2016 OT Individual Time: 1451-1540 OT Individual Time Calculation (min): 49 min    Skilled Therapeutic Interventions/Progress Updates: Pt was sitting in w/c with son Claiborne Billings at time of arrival, agreeable to tx. Pt propelled 80 ft to dayroom for bilateral UE strengthening with cues provided on technique (and encouragement). Once in dayroom, she participated in standing task (puzzle) for standing endurance, UE coordination, balance, and activity tolerance. She stood for 9 minutes without rest, cues for upright posture (Min A sit<stand). For remainder of tx, she completed task in sitting, appeared to experience flow. At end of tx she was left with son Claiborne Billings completing puzzle in dayroom per request.      Therapy Documentation Precautions:  Precautions Precautions: Fall Restrictions Weight Bearing Restrictions: No General:   Vital Signs: Therapy Vitals Temp: 98.6 F (37 C) Temp Source: Oral Pulse Rate: 64 Resp: 18 BP: (!) 114/52 Patient Position (if appropriate): Sitting Oxygen Therapy SpO2: 98 % O2 Device: Not Delivered Pain: No c/o pain during session  Pain Assessment Pain Assessment: 0-10 Pain Score: 3  Pain Type: Acute pain Pain Location: Head Pain Descriptors / Indicators: Aching Pain Onset: On-going Pain Intervention(s): Medication (See eMAR) ADL:      See Function Navigator for Current Functional Status.   Therapy/Group: Individual Therapy  Shanera Meske A Conna Terada 05/08/2016, 6:04 PM

## 2016-05-08 NOTE — Progress Notes (Signed)
Subjective/Complaints: Pt seen laying in bed this AM.  She slept well overnight.  She asks when she can eat.    ROS: Denies CP, SOB, N/V/D.  Objective: Vital Signs: Blood pressure (!) 101/43, pulse 84, temperature 99.4 F (37.4 C), temperature source Oral, resp. rate 16, height 5' (1.524 m), weight 74.9 kg (165 lb 1.6 oz), SpO2 93 %. No results found. Results for orders placed or performed during the hospital encounter of 04/30/16 (from the past 72 hour(s))  Urine culture     Status: Abnormal   Collection Time: 05/05/16  5:17 PM  Result Value Ref Range   Specimen Description URINE, RANDOM    Special Requests NONE    Culture >=100,000 COLONIES/mL ESCHERICHIA COLI (A)    Report Status 05/07/2016 FINAL    Organism ID, Bacteria ESCHERICHIA COLI (A)       Susceptibility   Escherichia coli - MIC*    AMPICILLIN 16 INTERMEDIATE Intermediate     CEFAZOLIN <=4 SENSITIVE Sensitive     CEFTRIAXONE <=1 SENSITIVE Sensitive     CIPROFLOXACIN >=4 RESISTANT Resistant     GENTAMICIN <=1 SENSITIVE Sensitive     IMIPENEM <=0.25 SENSITIVE Sensitive     NITROFURANTOIN <=16 SENSITIVE Sensitive     TRIMETH/SULFA <=20 SENSITIVE Sensitive     AMPICILLIN/SULBACTAM <=2 SENSITIVE Sensitive     PIP/TAZO <=4 SENSITIVE Sensitive     Extended ESBL NEGATIVE Sensitive     * >=100,000 COLONIES/mL ESCHERICHIA COLI  Urinalysis, Routine w reflex microscopic     Status: Abnormal   Collection Time: 05/05/16  5:17 PM  Result Value Ref Range   Color, Urine YELLOW YELLOW   APPearance HAZY (A) CLEAR   Specific Gravity, Urine 1.019 1.005 - 1.030   pH 6.0 5.0 - 8.0   Glucose, UA NEGATIVE NEGATIVE mg/dL   Hgb urine dipstick NEGATIVE NEGATIVE   Bilirubin Urine NEGATIVE NEGATIVE   Ketones, ur 5 (A) NEGATIVE mg/dL   Protein, ur NEGATIVE NEGATIVE mg/dL   Nitrite POSITIVE (A) NEGATIVE   Leukocytes, UA MODERATE (A) NEGATIVE   RBC / HPF 0-5 0 - 5 RBC/hpf   WBC, UA 6-30 0 - 5 WBC/hpf   Bacteria, UA MANY (A) NONE SEEN   Squamous Epithelial / LPF 0-5 (A) NONE SEEN  Creatinine, serum     Status: None   Collection Time: 05/07/16  5:46 AM  Result Value Ref Range   Creatinine, Ser 0.78 0.44 - 1.00 mg/dL   GFR calc non Af Amer >60 >60 mL/min   GFR calc Af Amer >60 >60 mL/min    Comment: (NOTE) The eGFR has been calculated using the CKD EPI equation. This calculation has not been validated in all clinical situations. eGFR's persistently <60 mL/min signify possible Chronic Kidney Disease.     General: NAD. Vital signs reviewed.  Heart: RRR. No JVD. Lungs: Clear to auscultation, breathing unlabored. Abdomen: Positive bowel sounds, soft Musculoskeletal: No edema. No tenderness Neurologic:  Sensation: Hyperalgesia distal b/l knees Motor: 4+/5 in bilateral deltoid, bicep, tricep, grip, right hip flexor, knee extensors, ankle dorsiflexor and plantar flexor 4+/5 in LLE, hip flexor, knee extensor, ankle dorsiflexor and plantar flexor with some apraxia (stable) B/l LE slightly limited below knees due to neuropathy Skin: No evidence of breakdown, Intact Psych: Mood and affect are appropriate  Assessment/Plan: 1. Functional deficits secondary to Left frontal, left corona radiata infarct which require 3+ hours per day of interdisciplinary therapy in a comprehensive inpatient rehab setting. Physiatrist is providing close team supervision and  24 hour management of active medical problems listed below. Physiatrist and rehab team continue to assess barriers to discharge/monitor patient progress toward functional and medical goals. FIM: Function - Bathing Bathing activity did not occur: Refused Position: Wheelchair/chair at sink Body parts bathed by patient: Right arm, Left arm, Chest, Abdomen, Front perineal area, Right upper leg, Left upper leg Body parts bathed by helper: Buttocks, Right lower leg, Left lower leg, Back Assist Level:  (Mod A)  Function- Upper Body Dressing/Undressing Upper body dressing/undressing  activity did not occur: Refused What is the patient wearing?: Pull over shirt/dress (pajama gown) Pull over shirt/dress - Perfomed by patient: Thread/unthread right sleeve, Thread/unthread left sleeve, Put head through opening, Pull shirt over trunk Assist Level: Set up, Supervision or verbal cues Set up : To obtain clothing/put away Function - Lower Body Dressing/Undressing Lower body dressing/undressing activity did not occur: Refused What is the patient wearing?: Non-skid slipper socks Position: Sitting EOB Underwear - Performed by patient: Pull underwear up/down Underwear - Performed by helper: Thread/unthread right underwear leg Non-skid slipper socks- Performed by helper: Don/doff right sock, Don/doff left sock Assist for footwear: Dependant Assist for lower body dressing:  (max A)  Function - Toileting Toileting activity did not occur: No continent bowel/bladder event Toileting steps completed by patient: Adjust clothing prior to toileting, Adjust clothing after toileting Toileting steps completed by helper: Performs perineal hygiene Toileting Assistive Devices: Grab bar or rail Assist level: Touching or steadying assistance (Pt.75%)  Function - Air cabin crew transfer activity did not occur: N/A Toilet transfer assistive device: Bedside commode Assist level to toilet: Supervision or verbal cues Assist level from toilet: Supervision or verbal cues Assist level to bedside commode (at bedside): Touching or steadying assistance (Pt > 75%) Assist level from bedside commode (at bedside): Touching or steadying assistance (Pt > 75%)  Function - Chair/bed transfer Chair/bed transfer activity did not occur: N/A Chair/bed transfer method: Ambulatory Chair/bed transfer assist level: Supervision or verbal cues Chair/bed transfer assistive device: Armrests, Walker  Function - Locomotion: Wheelchair Type: Manual Max wheelchair distance: 75 Assist Level: Touching or steadying  assistance (Pt > 75%) Assist Level: Touching or steadying assistance (Pt > 75%) Function - Locomotion: Ambulation Assistive device: Other (comment) (rollator) Max distance: 130' Assist level: Supervision or verbal cues Assist level: Supervision or verbal cues Assist level: Supervision or verbal cues Walk 150 feet activity did not occur: Safety/medical concerns (per report) Assist level: Supervision or verbal cues Walk 10 feet on uneven surfaces activity did not occur: Safety/medical concerns Assist level: Supervision or verbal cues  Function - Comprehension Comprehension: Auditory Comprehension assist level: Understands complex 90% of the time/cues 10% of the time  Function - Expression Expression: Verbal Expression assist level: Expresses basic 75 - 89% of the time/requires cueing 10 - 24% of the time. Needs helper to occlude trach/needs to repeat words.  Function - Social Interaction Social Interaction assist level: Interacts appropriately 90% of the time - Needs monitoring or encouragement for participation or interaction.  Function - Problem Solving Problem solving assist level: Solves basic 90% of the time/requires cueing < 10% of the time  Function - Memory Memory assist level: Recognizes or recalls 75 - 89% of the time/requires cueing 10 - 24% of the time Patient normally able to recall (first 3 days only): Current season, Location of own room, Staff names and faces, That he or she is in a hospital    [place] 05/08/2016, 9:40 AM    Medical Problem List  and Plan: 1. Gait disorder/apraxia with dysphagia/dysarthriasecondary to left frontal/corona radiata infarct secondary to small vessel disease as well as history of CVA in the past with left side mild residual weakness  Cont CIR 2. Anticoagulation: Subcutaneous Lovenox. Monitor platelet counts and any signs of bleeding 3. Pain Management/hx of chronic low back pain  Cont topiramate started 2/4  Appears to  have resolved. 4. Mood: Xanax 0.5 mg twice a day, Pamelor 25 mg daily at bedtime, Trilafon 2 mg daily at bedtime, Halcion 0.125 mg bedtime as needed, monitor for oversedation with the addition of topiramate 5. Neuropsych: This patient iscapable of making decisions on herown behalf. 6. Skin/Wound Care: Routine skin checks 7. Fluids/Electrolytes/Nutrition: Routine I&Os 8.Dysphagia. Dysphagia #1 thin liquids. Monitor hydration. Follow-up speech therapy Has hx of esophageal stricture  Cont protonix 9.Parkinson's disease. Sinemet 25-1003 times a day -monitor PD symptoms with therapy/activity and address as indicated.  10.Hyperlipidemia. Pravachol 11. Hypoalbuminemia  Supplement initiated 2/5 12. Nausea  Resolved  Zofran increased to 80m, increased to q4 PRN 2/6  Will cont to monitor 13. Constipation  May also be contributing to nausea  Increased reg again on 2/6  Improving  14. Acute lower UTI  UA+, Ucx E. Coli  Macrobid 2/8-2/14  LOS (Days): 8 A FACE TO FACE EVALUATION WAS PERFORMED

## 2016-05-08 NOTE — Progress Notes (Signed)
Physical Therapy Session Note  Patient Details  Name: Sarah [name] MRN: [id number] Date of Birth: [birth year]  Today's Date: 05/08/2016 PT Individual Time: 1008-1100 PT Individual Time Calculation (min): 52 min   Short Term Goals: Week 1:  PT Short Term Goal 1 (Week 1): Patient will consistently perform transfers with min assist  PT Short Term Goal 1 - Progress (Week 1): Met PT Short Term Goal 2 (Week 1): Patient will initiate stait training PT Short Term Goal 2 - Progress (Week 1): Met PT Short Term Goal 3 (Week 1): Patient will ambulate 5f with min assist and Rw  PT Short Term Goal 3 - Progress (Week 1): Met PT Short Term Goal 4 (Week 1): Patient will perform bed mobility with Min assist  PT Short Term Goal 4 - Progress (Week 1): Met PT Short Term Goal 5 (Week 1): Paitent will propell WC 1039fwith supervision assist.  PT Short Term Goal 5 - Progress (Week 1): Partly met  Skilled Therapeutic Interventions/Progress Updates:   Pt received in bed; reporting significant nausea and fatigue from not eating much this morning; reports needing salt for breakfast but stating that the staff would not give her any.  Explained to pt that the unit is out of salt and that she is not being restricted from salt.  Pt very perseverative about not eating "in a week" and "they are trying to starve me!"  Pt only agreed to therapy if therapist could find her some salt.  Performed supine > sit EOB with HOB elevated and bed rails with supervision but min A to scoot to EOB.  Performed sit > stand from bed and transfer stand pivot to w/c with rollator with supervision-min A due to pt leaving RW aside to sit in w/c.  Required verbal cues for safe stand > sit sequence.  In gym performed stair negotiation training up/down 2 stairs and 2 rails forwards to ascend, backwards to descend with mod A to advance COG; pt initially not wanting to perform stairs but encouraged pt and explained benefit of performing stairs for LE  strengthening and balance.  Pt performed gait with rollator x 50' (limited due to fatigue) with verbal and tactile cues to maintain upright posture, full step and stride length and foot clearance.  At end of session pt left in w/c with all items within reach and quick release belt in place.    Therapy Documentation Precautions:  Precautions Precautions: Fall Restrictions Weight Bearing Restrictions: No General: PT Amount of Missed Time (min): 8 Minutes PT Missed Treatment Reason: Patient fatigue Pain: Pain Assessment Pain Assessment: No/denies pain Pain Score: 7  Pain Type: Acute pain Pain Location: Head Pain Descriptors / Indicators: Aching Pain Onset: On-going Pain Intervention(s): Medication (See eMAR)   See Function Navigator for Current Functional Status.   Therapy/Group: Individual Therapy  [facility]/12/2016, 11:16 AM

## 2016-05-09 ENCOUNTER — Inpatient Hospital Stay (HOSPITAL_COMMUNITY): Payer: Medicare Other

## 2016-05-09 ENCOUNTER — Inpatient Hospital Stay (HOSPITAL_COMMUNITY): Payer: Medicare Other | Admitting: Occupational Therapy

## 2016-05-09 DIAGNOSIS — F411 Generalized anxiety disorder: Secondary | ICD-10-CM

## 2016-05-09 NOTE — Progress Notes (Signed)
Physical Therapy Weekly Progress Note  Patient Details  Name: Sarah [name] MRN: [id number] Date of Birth: May 25, 1926  Beginning of progress report period: May 01, 2016 End of progress report period: May 09, 2016  Today's Date: 05/09/2016 PT Individual Time: 0900-1000 PT Individual Time Calculation (min): 60 min  Pt c/o increased fatigue and initially declining therapy session. With encouragement, pt agreeable to getting up to change clothes. Using visual target during bed mobility to maintain focus, pt able to complete with extra time and no reports of dizziness with supervision using bed rails. Assisted pt with dressing EOB to change into new pajamas. Pt able to complete sit <> stands with supervision throughout dressing tasks with extra time for all. Stood in front of mirror to brush hair; PT assisted with hair while pt able to maintain standing balance with supervision. Ambulated in/out of bathroom with supervision using rollator and completed toileting with supervision. Discussed d/c planning and importance of continued mobility despite fatigue. Pt responds with "I am 89. I am just tired." Pt became nausous after sitting on toilet and notified RN for medication. Pt request to return to bed and reports feeling disappointed that she could not complete session as plan was to attempt car transfer this session. Encouraged her to try with PT this afternoon. In bed instructed in vestibular exercises for adaptation x 60 secs x 2 reps each with target with horizontal and vertical head movements. No complaints of symptoms with horizontal but mild symptoms with vertical which resolved within 30 seconds after stopping activity.    Patient has met 4 of 5 short term goals.  Pt did not fully meet w/c mobility goal due to it not being a focus of treatment as goals are focused on ambulation. Pt is making progress though limited with patient participation due to fatigue, nausea and headache. Pt often  declines leaving room and requires encouragement to progress mobility tasks. Schedule modified to 15/7 to accommodate pt's limited tolerance. Vestibular evaluation completed this week to address limitations with dizziness. Plan to d/c 2/15 with hired assistance at home and family support. Recommendation for 24/7 supervision.   Patient continues to demonstrate the following deficits muscle weakness and muscle joint tightness, decreased cardiorespiratoy endurance, vestibular, decreased awareness, decreased problem solving and decreased memory and decreased standing balance and decreased balance strategies and therefore will continue to benefit from skilled PT intervention to increase functional independence with mobility.  Patient progressing toward long term goals..  Continue plan of care. Discharged w/c mobility goals due to goal for patient to be primary ambulator.   PT Short Term Goals Week 1:  PT Short Term Goal 1 (Week 1): Patient will consistently perform transfers with min assist  PT Short Term Goal 1 - Progress (Week 1): Met PT Short Term Goal 2 (Week 1): Patient will initiate stait training PT Short Term Goal 2 - Progress (Week 1): Met PT Short Term Goal 3 (Week 1): Patient will ambulate 38f with min assist and Rw  PT Short Term Goal 3 - Progress (Week 1): Met PT Short Term Goal 4 (Week 1): Patient will perform bed mobility with Min assist  PT Short Term Goal 4 - Progress (Week 1): Met PT Short Term Goal 5 (Week 1): Paitent will propell WC 1074fwith supervision assist.  PT Short Term Goal 5 - Progress (Week 1): Partly met Week 2:  PT Short Term Goal 1 (Week 2): = LTG of supervision overall  Skilled Therapeutic Interventions/Progress Updates:  Ambulation/gait training;Balance/vestibular  training;Cognitive remediation/compensation;Community reintegration;Discharge planning;DME/adaptive equipment instruction;Functional electrical stimulation;Disease management/prevention;Functional  mobility training;Neuromuscular re-education;Patient/family education;Pain management;Psychosocial support;Splinting/orthotics;UE/LE Strength taining/ROM;Stair training;Therapeutic Activities;UE/LE Coordination activities;Therapeutic Exercise;Visual/perceptual remediation/compensation;Wheelchair propulsion/positioning   Therapy Documentation Precautions:  Precautions Precautions: Fall Restrictions Weight Bearing Restrictions: No   See Function Navigator for Current Functional Status.  Therapy/Group: Individual Therapy  Canary Brim Ivory Broad, PT, DPT  05/09/2016, 7:41 AM

## 2016-05-09 NOTE — Progress Notes (Signed)
Subjective/Complaints: Pt seen laying in bed this AM.  She states she is "goin to pass out because (I) need something to eat" and when encouraged to participate with SLP and therapies, she states, "can't they leave me alone for one day". Reminded about importance of rehab and functional progress.  ROS: Denies CP, SOB, N/V/D.  Objective: Vital Signs: Blood pressure (!) 112/46, pulse 76, temperature 98.4 F (36.9 C), temperature source Oral, resp. rate 18, height 5' (1.524 m), weight 74.9 kg (165 lb 1.6 oz), SpO2 94 %. No results found. Results for orders placed or performed during the hospital encounter of 04/30/16 (from the past 72 hour(s))  Creatinine, serum     Status: None   Collection Time: 05/07/16  5:46 AM  Result Value Ref Range   Creatinine, Ser 0.78 0.44 - 1.00 mg/dL   GFR calc non Af Amer >60 >60 mL/min   GFR calc Af Amer >60 >60 mL/min    Comment: (NOTE) The eGFR has been calculated using the CKD EPI equation. This calculation has not been validated in all clinical situations. eGFR's persistently <60 mL/min signify possible Chronic Kidney Disease.     General: NAD. Vital signs reviewed.  Heart: RRR. No JVD. Lungs: Clear to auscultation, breathing unlabored. Abdomen: Positive bowel sounds, soft Musculoskeletal: No edema. No tenderness Neurologic:  Sensation: Hyperalgesia distal b/l knees Motor: 4+/5 in bilateral deltoid, bicep, tricep, grip, right hip flexor, knee extensors, ankle dorsiflexor and plantar flexor 4+/5 in LLE, hip flexor, knee extensor, ankle dorsiflexor and plantar flexor with some apraxia (unchanged) B/l LE slightly limited below knees due to neuropathy Skin: No evidence of breakdown, Intact Psych: Mood and affect are appropriate  Assessment/Plan: 1. Functional deficits secondary to Left frontal, left corona radiata infarct which require 3+ hours per day of interdisciplinary therapy in a comprehensive inpatient rehab setting. Physiatrist is providing  close team supervision and 24 hour management of active medical problems listed below. Physiatrist and rehab team continue to assess barriers to discharge/monitor patient progress toward functional and medical goals. FIM: Function - Bathing Bathing activity did not occur: Refused Position: Wheelchair/chair at sink Body parts bathed by patient: Right arm, Left arm, Chest, Abdomen, Front perineal area, Right upper leg, Left upper leg Body parts bathed by helper: Buttocks, Right lower leg, Left lower leg, Back Assist Level:  (Mod A)  Function- Upper Body Dressing/Undressing Upper body dressing/undressing activity did not occur: Refused What is the patient wearing?: Pull over shirt/dress (pajame gown) Pull over shirt/dress - Perfomed by patient: Thread/unthread right sleeve, Thread/unthread left sleeve, Put head through opening, Pull shirt over trunk Assist Level: Set up, Supervision or verbal cues Set up : To obtain clothing/put away Function - Lower Body Dressing/Undressing Lower body dressing/undressing activity did not occur: Refused What is the patient wearing?: Pants (pajama pants) Position: Sitting EOB Underwear - Performed by patient: Pull underwear up/down Underwear - Performed by helper: Thread/unthread right underwear leg Pants- Performed by patient: Pull pants up/down Pants- Performed by helper: Thread/unthread right pants leg, Thread/unthread left pants leg Non-skid slipper socks- Performed by helper: Don/doff right sock, Don/doff left sock Assist for footwear: Supervision/touching assist Assist for lower body dressing: Supervision or verbal cues, Set up Set up : To obtain clothing/put away  Function - Toileting Toileting activity did not occur: No continent bowel/bladder event Toileting steps completed by patient: Adjust clothing prior to toileting, Performs perineal hygiene, Adjust clothing after toileting Toileting steps completed by helper: Adjust clothing prior to  toileting, Adjust clothing after toileting  Toileting Assistive Devices: Grab bar or rail Assist level: Supervision or verbal cues  Function - Air cabin crew transfer activity did not occur: N/A Toilet transfer assistive device: Environmental consultant, Grab bar (rollator) Assist level to toilet: Supervision or verbal cues Assist level from toilet: Supervision or verbal cues Assist level to bedside commode (at bedside): Touching or steadying assistance (Pt > 75%) Assist level from bedside commode (at bedside): Touching or steadying assistance (Pt > 75%)  Function - Chair/bed transfer Chair/bed transfer activity did not occur: N/A Chair/bed transfer method: Ambulatory Chair/bed transfer assist level: Supervision or verbal cues Chair/bed transfer assistive device: Armrests, Walker  Function - Locomotion: Wheelchair Type: Manual Max wheelchair distance: 75 Assist Level: Touching or steadying assistance (Pt > 75%) Assist Level: Touching or steadying assistance (Pt > 75%) Function - Locomotion: Ambulation Assistive device: Other (comment) (rollator) Max distance: 20' Assist level: Supervision or verbal cues Assist level: Supervision or verbal cues Assist level: Touching or steadying assistance (Pt > 75%) Walk 150 feet activity did not occur: Safety/medical concerns (per report) Assist level: Supervision or verbal cues Walk 10 feet on uneven surfaces activity did not occur: Safety/medical concerns Assist level: Supervision or verbal cues  Function - Comprehension Comprehension: Auditory Comprehension assist level: Understands basic 90% of the time/cues < 10% of the time  Function - Expression Expression: Verbal Expression assist level: Expresses basic 90% of the time/requires cueing < 10% of the time.  Function - Social Interaction Social Interaction assist level: Interacts appropriately 90% of the time - Needs monitoring or encouragement for participation or interaction.  Function -  Problem Solving Problem solving assist level: Solves basic 90% of the time/requires cueing < 10% of the time  Function - Memory Memory assist level: Recognizes or recalls 90% of the time/requires cueing < 10% of the time Patient normally able to recall (first 3 days only): Current season, Location of own room, Staff names and faces, That he or she is in a hospital    [place] 05/09/2016, 2:14 PM    Medical Problem List and Plan: 1. Gait disorder/apraxia with dysphagia/dysarthriasecondary to left frontal/corona radiata infarct secondary to small vessel disease as well as history of CVA in the past with left side mild residual weakness  Cont CIR 2. Anticoagulation: Subcutaneous Lovenox. Monitor platelet counts and any signs of bleeding 3. Pain Management/hx of chronic low back pain  Cont topiramate started 2/4 for headaches- resolved 4. Mood: Xanax 0.5 mg twice a day, Pamelor 25 mg daily at bedtime, Trilafon 2 mg daily at bedtime, Halcion 0.125 mg bedtime as needed, monitor for oversedation with the addition of topiramate 5. Neuropsych: This patient iscapable of making decisions on herown behalf. 6. Skin/Wound Care: Routine skin checks 7. Fluids/Electrolytes/Nutrition: Routine I&Os  Labs ordered for tomorrow 8.Dysphagia. Dysphagia #1 thin liquids. Monitor hydration. Follow-up speech therapy Has hx of esophageal stricture  Cont protonix 9.Parkinson's disease. Sinemet 25-1003 times a day -monitor PD symptoms with therapy/activity and address as indicated.  10.Hyperlipidemia. Pravachol 11. Hypoalbuminemia  Supplement initiated 2/5 12. Nausea  Resolved  Zofran increased to 84m, increased to q4 PRN 2/6  Will cont to monitor 13. Constipation  May also be contributing to nausea  Increased reg again on 2/6  Improving  14. Acute lower UTI  UA+, Ucx E. Coli  Macrobid 2/8-2/14  LOS (Days): 9 A FACE TO FACE EVALUATION WAS PERFORMED

## 2016-05-09 NOTE — Progress Notes (Signed)
Occupational Therapy Session Note  Patient Details  Name: Sarah [name] MRN: [id number] Date of Birth: 11-16-1926  Today's Date: 05/09/2016 OT Individual Time: [id number] OT Individual Time Calculation (min): 46 min    Skilled Therapeutic Interventions/Progress Updates: Pt was sitting in w/c at sink with caregiver [name] present doing her hair. Pt was agreeable to complete bathing. Tx focus on endurance, UE coordination, and ADL retraining. Min A for doffing bra and gown. Min A for UB bathing with extra time. Due to fatigue, pt declined completing LB bathing "I'm just too tired." She completed grooming tasks/lotion application with setup. Afterwards pt requested to return to bed. She ambulated short distance with rollator, min guard, with cues for locking brakes Lateral scooting towards [facility] completed with supervision. Mod A sit<supine. Pt was then repositioned for comfort and left with all needs within reach at time of departure.      Therapy Documentation Precautions:  Precautions Precautions: Fall Restrictions Weight Bearing Restrictions: No  Pain: No c/o pain during session  Pain Assessment Pain Assessment: No/denies pain (requested tylenol for therapy) Pain Intervention(s): Medication (See eMAR) ADL:      See Function Navigator for Current Functional Status.   Therapy/Group: Individual Therapy  Wenda Vanschaick A Graham Hyun 05/09/2016, 4:40 PM

## 2016-05-09 NOTE — Progress Notes (Addendum)
Physical Therapy Note  Patient Details  Name: Sarah [name] MRN: [id number] Date of Birth: 1926/08/08 Today's Date: 05/09/2016  1345-1430, 45 min; 1630-1645, 15 min individual tx Pain: 1st tx- low back, 5/10;  HA 6/10 premedicated; 2nd tx- none reported  tx 1:  mulated car transfer with 4WW with min guard assist, min cues for technique. Caregiver [name] stated that pt has a safety bar which they use in personal car. Gait with 4WW x 100' iwht min guard assist, forward flexed.  Gait without AD, with therapist's UE used for support in front of her, x 79' with improved trunk extension. Pt observed swallowing precautions with occasional min VC while drinking water during session.  VOR exs in standing wth bil UE support ;  object 18" away, with horizontal head movements; nausea started after 45 sec, requiring seated rest break; 2nd trial 65 sec without nausea. Pt left resting in w/c with all needs within reach; [name] stated she would let Nsg know when she was leaving room , to apply quick release belt..  tx 2;  Supine neuro re-ed: 10 x 1 each bil bridging, R/: assisted straight leg raise, short arc quad knee ext, bil lower trunk rotation,  Ankle circles, ankle pumps.  Pt left resting in bed with all needs within reach, bed alarm set.  See function navigator for current status.  Njeri Vicente 05/09/2016, 12:56 PM

## 2016-05-10 ENCOUNTER — Inpatient Hospital Stay (HOSPITAL_COMMUNITY): Payer: Medicare Other | Admitting: Physical Therapy

## 2016-05-10 ENCOUNTER — Inpatient Hospital Stay (HOSPITAL_COMMUNITY): Payer: Medicare Other | Admitting: Speech Pathology

## 2016-05-10 ENCOUNTER — Inpatient Hospital Stay (HOSPITAL_COMMUNITY): Payer: Medicare Other | Admitting: Occupational Therapy

## 2016-05-10 LAB — CBC WITH DIFFERENTIAL/PLATELET
Basophils Absolute: 0 10*3/uL (ref 0.0–0.1)
Basophils Relative: 0 %
EOS ABS: 0.5 10*3/uL (ref 0.0–0.7)
Eosinophils Relative: 10 %
HEMATOCRIT: 34.9 % — AB (ref 36.0–46.0)
HEMOGLOBIN: 11.2 g/dL — AB (ref 12.0–15.0)
LYMPHS ABS: 0.8 10*3/uL (ref 0.7–4.0)
Lymphocytes Relative: 15 %
MCH: 29.7 pg (ref 26.0–34.0)
MCHC: 32.1 g/dL (ref 30.0–36.0)
MCV: 92.6 fL (ref 78.0–100.0)
MONOS PCT: 12 %
Monocytes Absolute: 0.7 10*3/uL (ref 0.1–1.0)
NEUTROS PCT: 63 %
Neutro Abs: 3.6 10*3/uL (ref 1.7–7.7)
Platelets: 356 10*3/uL (ref 150–400)
RBC: 3.77 MIL/uL — ABNORMAL LOW (ref 3.87–5.11)
RDW: 13.6 % (ref 11.5–15.5)
WBC: 5.6 10*3/uL (ref 4.0–10.5)

## 2016-05-10 LAB — BASIC METABOLIC PANEL
Anion gap: 9 (ref 5–15)
BUN: 13 mg/dL (ref 6–20)
CHLORIDE: 103 mmol/L (ref 101–111)
CO2: 21 mmol/L — AB (ref 22–32)
CREATININE: 0.84 mg/dL (ref 0.44–1.00)
Calcium: 8.8 mg/dL — ABNORMAL LOW (ref 8.9–10.3)
GFR calc non Af Amer: >60 mL/min (ref >60)
Glucose, Bld: 95 mg/dL (ref 65–99)
Potassium: 3.9 mmol/L (ref 3.5–5.1)
Sodium: 133 mmol/L — ABNORMAL LOW (ref 135–145)

## 2016-05-10 NOTE — Progress Notes (Signed)
Subjective/Complaints: Pt seen sitting up in bed.  She yells that "You need to stand right here because I need to talk to you".  She later complains of a "constant hacking cough, persistent nausea and headaches".  Pt does not cough throughout our entire conversation.  Reminded her that her use of PRN nausea meds has significantly decreased.    ROS: +Nausea, headaches, cough. Denies CP, SOB, V/D.  Objective: Vital Signs: Blood pressure (!) 130/55, pulse 73, temperature 98.5 F (36.9 C), temperature source Oral, resp. rate 18, height 5' (1.524 m), weight 73.8 kg (162 lb 11.2 oz), SpO2 95 %. No results found. No results found for this or any previous visit (from the past 72 hour(s)).  General: NAD. Vital signs reviewed.  Heart: RRR. No JVD. Lungs: Clear to auscultation, breathing unlabored. Abdomen: Positive bowel sounds, soft Musculoskeletal: No edema. No tenderness Neurologic:  Sensation: Hyperalgesia distal b/l knees Motor: 4+/5 in bilateral deltoid, bicep, tricep, grip, right hip flexor, knee extensors, ankle dorsiflexor and plantar flexor 4+/5 in LLE, hip flexor, knee extensor, ankle dorsiflexor and plantar flexor with some apraxia (stable) B/l LE slightly limited below knees due to neuropathy Skin: No evidence of breakdown, Intact Psych: Mood and affect are appropriate  Assessment/Plan: 1. Functional deficits secondary to Left frontal, left corona radiata infarct which require 3+ hours per day of interdisciplinary therapy in a comprehensive inpatient rehab setting. Physiatrist is providing close team supervision and 24 hour management of active medical problems listed below. Physiatrist and rehab team continue to assess barriers to discharge/monitor patient progress toward functional and medical goals. FIM: Function - Bathing Bathing activity did not occur: Refused Position: Wheelchair/chair at sink Body parts bathed by patient: Right arm, Left arm, Chest, Abdomen Body parts  bathed by helper: Back Bathing not applicable: Front perineal area, Buttocks, Right upper leg, Left upper leg, Right lower leg, Left lower leg Assist Level: Touching or steadying assistance(Pt > 75%)  Function- Upper Body Dressing/Undressing Upper body dressing/undressing activity did not occur: Refused What is the patient wearing?: Bra, Pull over shirt/dress (pajama gown) Bra - Perfomed by patient: Thread/unthread right bra strap, Thread/unthread left bra strap Bra - Perfomed by helper: Hook/unhook bra (pull down sports bra) Pull over shirt/dress - Perfomed by patient: Thread/unthread right sleeve, Thread/unthread left sleeve, Put head through opening, Pull shirt over trunk Assist Level: Set up, Supervision or verbal cues Set up : To obtain clothing/put away Function - Lower Body Dressing/Undressing Lower body dressing/undressing activity did not occur: Refused What is the patient wearing?: Pants (pajama pants) Position: Sitting EOB Underwear - Performed by patient: Pull underwear up/down Underwear - Performed by helper: Thread/unthread right underwear leg Pants- Performed by patient: Pull pants up/down Pants- Performed by helper: Thread/unthread right pants leg, Thread/unthread left pants leg Non-skid slipper socks- Performed by helper: Don/doff right sock, Don/doff left sock Assist for footwear: Supervision/touching assist Assist for lower body dressing: Supervision or verbal cues, Set up Set up : To obtain clothing/put away  Function - Toileting Toileting activity did not occur: No continent bowel/bladder event Toileting steps completed by patient: Performs perineal hygiene Toileting steps completed by helper: Adjust clothing prior to toileting, Adjust clothing after toileting Toileting Assistive Devices: Grab bar or rail Assist level: Touching or steadying assistance (Pt.75%)  Function - Toilet Transfers Toilet transfer activity did not occur: N/A Toilet transfer assistive  device: Walker, Grab bar (rollator) Assist level to toilet: Supervision or verbal cues Assist level from toilet: Supervision or verbal cues Assist level to bedside  commode (at bedside): Touching or steadying assistance (Pt > 75%) Assist level from bedside commode (at bedside): Touching or steadying assistance (Pt > 75%)  Function - Chair/bed transfer Chair/bed transfer activity did not occur: N/A Chair/bed transfer method: Ambulatory Chair/bed transfer assist level: Touching or steadying assistance (Pt > 75%) Chair/bed transfer assistive device: Walker, Armrests (rollator) Chair/bed transfer details: Verbal cues for technique, Verbal cues for precautions/safety  Function - Locomotion: Wheelchair Type: Manual Max wheelchair distance: 75 Assist Level: Touching or steadying assistance (Pt > 75%) Assist Level: Touching or steadying assistance (Pt > 75%) Function - Locomotion: Ambulation Assistive device: Walker-rolling (4WW) Max distance: 100 Assist level: Supervision or verbal cues Assist level: Supervision or verbal cues Assist level: Touching or steadying assistance (Pt > 75%) Walk 150 feet activity did not occur: Safety/medical concerns (per report) Assist level: Supervision or verbal cues Walk 10 feet on uneven surfaces activity did not occur: Safety/medical concerns Assist level: Supervision or verbal cues  Function - Comprehension Comprehension: Auditory Comprehension assist level: Follows basic conversation/direction with extra time/assistive device  Function - Expression Expression: Verbal Expression assist level: Expresses basic 90% of the time/requires cueing < 10% of the time.  Function - Social Interaction Social Interaction assist level: Interacts appropriately 75 - 89% of the time - Needs redirection for appropriate language or to initiate interaction.  Function - Problem Solving Problem solving assist level: Solves basic 90% of the time/requires cueing < 10% of  the time  Function - Memory Memory assist level: Recognizes or recalls 90% of the time/requires cueing < 10% of the time Patient normally able to recall (first 3 days only): Current season, Location of own room, Staff names and faces, That he or she is in a hospital    [place] 05/10/2016, 9:41 AM    Medical Problem List and Plan: 1. Gait disorder/apraxia with dysphagia/dysarthriasecondary to left frontal/corona radiata infarct secondary to small vessel disease as well as history of CVA in the past with left side mild residual weakness  Cont CIR 2. Anticoagulation: Subcutaneous Lovenox. Monitor platelet counts and any signs of bleeding 3. Pain Management/hx of chronic low back pain  Cont topiramate started 2/4 for headaches- improved 4. Mood: Xanax 0.5 mg twice a day, Pamelor 25 mg daily at bedtime, Trilafon 2 mg daily at bedtime, Halcion 0.125 mg bedtime as needed, monitor for oversedation with the addition of topiramate 5. Neuropsych: This patient iscapable of making decisions on herown behalf. 6. Skin/Wound Care: Routine skin checks 7. Fluids/Electrolytes/Nutrition: Routine I&Os  Labs pending 8.Dysphagia. Dysphagia #1 thin liquids. Monitor hydration. Follow-up speech therapy Has hx of esophageal stricture  Cont protonix 9.Parkinson's disease. Sinemet 25-1003 times a day -monitor PD symptoms with therapy/activity and address as indicated.  10.Hyperlipidemia. Pravachol 11. Hypoalbuminemia  Supplement initiated 2/5 12. Nausea  Improved  Zofran increased to 8mg , increased to q4 PRN 2/6  Will cont to monitor 13. Constipation  Increased reg again on 2/6  Improving  14. Acute lower UTI  UA+, Ucx E. Coli  Macrobid 2/8-2/14  LOS (Days): 10 A FACE TO FACE EVALUATION WAS PERFORMED

## 2016-05-10 NOTE — Progress Notes (Signed)
Speech Language Pathology Daily Session Note  Patient Details  Name: Sarah [name] MRN: [id number] Date of Birth: 23-Jun-1926  Today's Date: 05/10/2016 SLP Individual Time: 1500-1600 SLP Individual Time Calculation (min): 60 min  Short Term Goals: Week 2: SLP Short Term Goal 1 (Week 2): Patient will consume trials of Dys.2 textures over 2 sessions, with Mod I recall to alternate solids and liquids to effectively prevent s/s of regurgitation prior to upgrade.  SLP Short Term Goal 2 (Week 2): Patient will consume current diet without overt s/s of aspiration and Mod I for use of swallowing compenstory strategies.  SLP Short Term Goal 3 (Week 2): Patient will utilize speech intelligibility strategies at the sentence level with supervision verbal cues.  SLP Short Term Goal 4 (Week 2): Patient will utilize comepsatory strategies for short term recall of new and daily information with Min assist question cues.   Skilled Therapeutic Interventions: Skilled ST treatment focused on dysphagia and speech communication goals. SLP facilitated session by providing skilled observation of trials of dysphagia 2 snack. Pt consumed without overt s/s of aspiration or esophageal dysmotility. Pt able to implement swallow compensatory strategies with Mod I. Pt utilized speech intelligibility strategies with Mod I to communicate at the sentence level with ~100% intelligibility. Pt was left upright in bed, with all needs within reach. Continue current plan of care.      Function:  Eating Eating   Modified Consistency Diet: Yes Eating Assist Level: More than reasonable amount of time;Set up assist for;Supervision or verbal cues   Eating Set Up Assist For: Opening containers;Cutting food       Cognition Comprehension Comprehension assist level: Follows basic conversation/direction with extra time/assistive device  Expression   Expression assist level: Expresses basic 90% of the time/requires cueing < 10% of the  time.  Social Interaction Social Interaction assist level: Interacts appropriately 75 - 89% of the time - Needs redirection for appropriate language or to initiate interaction.  Problem Solving Problem solving assist level: Solves basic 90% of the time/requires cueing < 10% of the time  Memory Memory assist level: Recognizes or recalls 90% of the time/requires cueing < 10% of the time    Pain Pain Assessment Pain Assessment: 0-10 Pain Score: 8  Pain Type: Chronic pain Pain Location: Head Pain Descriptors / Indicators: Headache Pain Frequency: Constant Pain Onset: On-going Patients Stated Pain Goal: 4 Pain Intervention(s): Medication (See eMAR)  Therapy/Group: Individual Therapy  Leary Mcnulty B. Rutherford Nail, M.S., CCC-SLP Speech-Language Pathologist  Shresta Risden 05/10/2016, 3:36 PM

## 2016-05-10 NOTE — Plan of Care (Signed)
Problem: RH Tub/Shower Transfers Goal: LTG Patient will perform tub/shower transfers w/assist (OT) LTG: Patient will perform tub/shower transfers with assist, with/without cues using equipment (OT)  Downgraded for safety

## 2016-05-10 NOTE — Progress Notes (Signed)
Occupational Therapy Weekly Progress Note  Patient Details  Name: Sarah [name] MRN: [id number] Date of Birth: [birth year]  Beginning of progress report period: May 01, 2016 End of progress report period: May 10, 2016  Today's Date: 05/10/2016 OT Individual Time: 7076-1518 OT Individual Time Calculation (min): 41 min    Patient has met 3 of 4 short term goals.  Pt has made slow progress this week secondary to barriers including c/o dizziness and c/o headache. Pt had increased difficulty participating in morning sessions and often does not physically participate in these sessions. Pt performs self care tasks at overall min A at this time.   Patient continues to demonstrate the following deficits: muscle weakness, decreased cardiorespiratoy endurance, dizziness and decreased sitting balance, decreased standing balance and decreased balance strategies and therefore will continue to benefit from skilled OT intervention to enhance overall performance with BADL.  Patient progressing toward long term goals..  Plan of care revisions: shower transfer goal downgraded to min A for safety.  OT Short Term Goals Week 1:  OT Short Term Goal 1 (Week 1): Pt will perform toileting with min A for balance during clothing management and hygiene. OT Short Term Goal 1 - Progress (Week 1): Met OT Short Term Goal 2 (Week 1): Pt will perform shower transfer with min A and use of DME as needed. OT Short Term Goal 2 - Progress (Week 1): Not met OT Short Term Goal 3 (Week 1): Pt will engage in 10 minutes of functional tasks with 2 or less rest breaks.  OT Short Term Goal 3 - Progress (Week 1): Met OT Short Term Goal 4 (Week 1): Pt will perform LB dressing with mod A in order to increase I with LB self care.  OT Short Term Goal 4 - Progress (Week 1): Met Week 2:  OT Short Term Goal 1 (Week 2): STGs=LTGS secondary to upcoming discharge  Skilled Therapeutic Interventions/Progress Updates:    Pt supine in bed  upon entering the room, pt reports, "I feel so bad ." Pt reports she has a terrible cough yet she does not cough during this session. She perseverates on asking for cough medication during this session as well. Pt with c/o muscle pain in R shoulder and L LE in which OT applied muscle rub during this session. Pt with questions regarding discharge and recommendation for 24/7 supervision once leaving home. Pt declined to exit bed this session. Pt remains supine with call bell and all needed items .  Therapy Documentation Precautions:  Precautions Precautions: Fall Restrictions Weight Bearing Restrictions: No  See Function Navigator for Current Functional Status.   Therapy/Group: Individual Therapy  Gypsy Decant 05/10/2016, 9:42 AM

## 2016-05-10 NOTE — Progress Notes (Signed)
Physical Therapy Session Note  Patient Details  Name: MARKEE REMLINGER MRN: [id number] Date of Birth: 11-26-1926  Today's Date: 05/10/2016 PT Individual Time: 1300-1359 PT Individual Time Calculation (min): 59 min   Short Term Goals: Week 2:  PT Short Term Goal 1 (Week 2): = LTG of supervision overall  Skilled Therapeutic Interventions/Progress Updates: Pt presented in bed completing lunch. Pt stating increased nausea and feeling "half sick".  Pt eventually agreeable to participate. Performed supine to sit with HOB elevated and minA. Sit to stand requiring x2 attempts with initial posterior lean. Pt ambulated 164f with rollator with cues for erect posture. Upon returning back to room pt indicating increased dizziness and nausea. Resolved with seated rest at EOB however pt stating too fatigued to perform standing activities. Pt participated in supine therex including ankle pumps, hip abd/add, SAQ, heel slides, AA SLR, QS x 15 bilaterally. Pt left in bed with all current needs met.      Therapy Documentation Precautions:  Precautions Precautions: Fall Restrictions Weight Bearing Restrictions: No General:     See Function Navigator for Current Functional Status.   Therapy/Group: Individual Therapy  Tarence Searcy  Sangita Zani, PTA  05/10/2016, 4:24 PM

## 2016-05-11 ENCOUNTER — Inpatient Hospital Stay (HOSPITAL_COMMUNITY): Payer: Medicare Other | Admitting: Physical Therapy

## 2016-05-11 ENCOUNTER — Inpatient Hospital Stay (HOSPITAL_COMMUNITY): Payer: Medicare Other | Admitting: Occupational Therapy

## 2016-05-11 ENCOUNTER — Inpatient Hospital Stay (HOSPITAL_COMMUNITY): Payer: Medicare Other | Admitting: Speech Pathology

## 2016-05-11 DIAGNOSIS — R059 Cough, unspecified: Secondary | ICD-10-CM

## 2016-05-11 DIAGNOSIS — R05 Cough: Secondary | ICD-10-CM

## 2016-05-11 DIAGNOSIS — D62 Acute posthemorrhagic anemia: Secondary | ICD-10-CM

## 2016-05-11 DIAGNOSIS — E871 Hypo-osmolality and hyponatremia: Secondary | ICD-10-CM

## 2016-05-11 MED ORDER — BENZONATATE 100 MG PO CAPS
100.0000 mg | ORAL_CAPSULE | Freq: Two times a day (BID) | ORAL | Status: DC | PRN
  Administered 2016-05-11 – 2016-05-13 (×5): 100 mg via ORAL
  Filled 2016-05-11 (×5): qty 1

## 2016-05-11 NOTE — Plan of Care (Signed)
Problem: RH BLADDER ELIMINATION Goal: RH STG MANAGE BLADDER WITH ASSISTANCE STG Manage Bladder With min Assistance  Outcome: Not Progressing Incontinent at times-wears depends max assist for depends change

## 2016-05-11 NOTE — Progress Notes (Signed)
Physical Therapy Session Note  Patient Details  Name: Sarah [name] MRN: [id number] Date of Birth: 1926-06-24  Today's Date: 05/11/2016 PT Individual Time: 1020-1050, 1135-1200 PT Individual Time Calculation (min): 30 min and 25 min   Short Term Goals: Week 2:  PT Short Term Goal 1 (Week 2): = LTG of supervision overall  Skilled Therapeutic Interventions/Progress Updates: Tx1: Pt presented in bed c/o malaise and fatigue. Pt encouraged to try OOB to increase activity tolerance. Pt performed supine to sit with supervision and use of features.  Pt ambulated 136f with rollator, cues for upright posture. Pt with decreased bilateral foot clearance and decreased cadence. Pt returned to room and seated in recliner chair with call bell within reach and all needs met.   Tx2: Pt presented in w/c with caretaker RVelva Harmanpresent. Pt requesting to use toilet. Discussed with caretaker encouragement of independence with activities for improved functional tolerance. Pt ambulated to toilet with use of rollator and pt performed toilet transfers with supervision. Pt returned to recliner chair and performed seated therex. Performed LAQ, hip abd/add, ankle pumps, hiip flexion, HS pulls with level 2 resistance band x 15 bilaterally. Pt hand off to SLP at end of session.      Therapy Documentation Precautions:  Precautions Precautions: Fall Restrictions Weight Bearing Restrictions: No General:   Vital Signs: Therapy Vitals Temp: 98.5 F (36.9 C) Temp Source: Oral Pulse Rate: 79 Resp: 18 BP: (!) 122/39 Patient Position (if appropriate): Lying Oxygen Therapy SpO2: 99 % O2 Device: Not Delivered     See Function Navigator for Current Functional Status.   Therapy/Group: Individual Therapy  Geoge Lawrance  Garth Diffley, PTA  05/11/2016, 3:47 PM

## 2016-05-11 NOTE — Progress Notes (Signed)
Speech Language Pathology Daily Session Note  Patient Details  Name: Sarah [name] MRN: [id number] Date of Birth: 07/03/1926  Today's Date: 05/11/2016 SLP Individual Time: 1200-1230 SLP Individual Time Calculation (min): 30 min  Short Term Goals: Week 2: SLP Short Term Goal 1 (Week 2): Patient will consume trials of Dys.2 textures over 2 sessions, with Mod I recall to alternate solids and liquids to effectively prevent s/s of regurgitation prior to upgrade.  SLP Short Term Goal 2 (Week 2): Patient will consume current diet without overt s/s of aspiration and Mod I for use of swallowing compenstory strategies.  SLP Short Term Goal 3 (Week 2): Patient will utilize speech intelligibility strategies at the sentence level with supervision verbal cues.  SLP Short Term Goal 4 (Week 2): Patient will utilize comepsatory strategies for short term recall of new and daily information with Min assist question cues.   Skilled Therapeutic Interventions: Skilled treatment session focused on dysphagia goals. Patient consumed trial tray of Dys. 2 textures with thin liquids without overt s/s of aspiration or reports of globus sensation and required supervision verbal cues for use of swallowing compensatory strategies. Recommend patient upgrade to Dys. 2 textures. Patient's caregiver present and verbalized understanding and agreement. Patient left upright in recliner. Continue with current plan of care.      Function:  Eating Eating   Modified Consistency Diet: Yes Eating Assist Level: More than reasonable amount of time;Set up assist for;Supervision or verbal cues   Eating Set Up Assist For: Opening containers       Cognition Comprehension Comprehension assist level: Understands basic 90% of the time/cues < 10% of the time  Expression   Expression assist level: Expresses basic 75 - 89% of the time/requires cueing 10 - 24% of the time. Needs helper to occlude trach/needs to repeat words.  Social  Interaction Social Interaction assist level: Interacts appropriately 75 - 89% of the time - Needs redirection for appropriate language or to initiate interaction.  Problem Solving Problem solving assist level: Solves basic 75 - 89% of the time/requires cueing 10 - 24% of the time  Memory Memory assist level: Recognizes or recalls 75 - 89% of the time/requires cueing 10 - 24% of the time    Pain No/Denies Pain   Therapy/Group: Individual Therapy  Jocsan Mcginley 05/11/2016, 3:53 PM

## 2016-05-11 NOTE — Progress Notes (Signed)
Physical Therapy Session Note  Patient Details  Name: Sarah [name] MRN: [id number] Date of Birth: 1926/05/14  Today's Date: 05/11/2016 PT Individual Time: 1500-1530 PT Individual Time Calculation (min): 30 min   Short Term Goals: Week 2:  PT Short Term Goal 1 (Week 2): = LTG of supervision overall  Skilled Therapeutic Interventions/Progress Updates: Pt received standing in bathroom with handoff from NT; denies pain but reports fatigue and that "my legs are weak as water" however is agreeable to treatment with encouragement. Gait to leave bathroom with RW and close S. Standing exercises from EOB with RW for balance and S. Performed heel raises, hip flexion marching 2x15 reps each. Seated rest breaks between each set d/t fatigue. Sit >supine with modA for LE management. Remained supine in bed at end of session, alarm intact and all needs in reach.      Therapy Documentation Precautions:  Precautions Precautions: Fall Restrictions Weight Bearing Restrictions: No   See Function Navigator for Current Functional Status.   Therapy/Group: Individual Therapy  Luberta Mutter 05/11/2016, 3:28 PM

## 2016-05-11 NOTE — Progress Notes (Signed)
Subjective/Complaints: Pt seen laying in bed this AM.  She slept well overnight.  She states she can't do anything with therapies today because she is weak.  She complains of dry cough.  She does note that she "loves" her new diet.   ROS: +Cough. Denies CP, SOB, V/D.  Objective: Vital Signs: Blood pressure (!) 129/55, pulse 85, temperature 98.6 F (37 C), temperature source Oral, resp. rate 18, height 5' (1.524 m), weight 74.4 kg (164 lb), SpO2 94 %. No results found. Results for orders placed or performed during the hospital encounter of 04/30/16 (from the past 72 hour(s))  Basic metabolic panel     Status: Abnormal   Collection Time: 05/10/16 11:48 AM  Result Value Ref Range   Sodium 133 (L) 135 - 145 mmol/L   Potassium 3.9 3.5 - 5.1 mmol/L   Chloride 103 101 - 111 mmol/L   CO2 21 (L) 22 - 32 mmol/L   Glucose, Bld 95 65 - 99 mg/dL   BUN 13 6 - 20 mg/dL   Creatinine, Ser 0.84 0.44 - 1.00 mg/dL   Calcium 8.8 (L) 8.9 - 10.3 mg/dL   GFR calc non Af Amer >60 >60 mL/min   GFR calc Af Amer >60 >60 mL/min    Comment: (NOTE) The eGFR has been calculated using the CKD EPI equation. This calculation has not been validated in all clinical situations. eGFR's persistently <60 mL/min signify possible Chronic Kidney Disease.    Anion gap 9 5 - 15  CBC with Differential/Platelet     Status: Abnormal   Collection Time: 05/10/16 11:48 AM  Result Value Ref Range   WBC 5.6 4.0 - 10.5 K/uL   RBC 3.77 (L) 3.87 - 5.11 MIL/uL   Hemoglobin 11.2 (L) 12.0 - 15.0 g/dL   HCT 34.9 (L) 36.0 - 46.0 %   MCV 92.6 78.0 - 100.0 fL   MCH 29.7 26.0 - 34.0 pg   MCHC 32.1 30.0 - 36.0 g/dL   RDW 13.6 11.5 - 15.5 %   Platelets 356 150 - 400 K/uL   Neutrophils Relative % 63 %   Neutro Abs 3.6 1.7 - 7.7 K/uL   Lymphocytes Relative 15 %   Lymphs Abs 0.8 0.7 - 4.0 K/uL   Monocytes Relative 12 %   Monocytes Absolute 0.7 0.1 - 1.0 K/uL   Eosinophils Relative 10 %   Eosinophils Absolute 0.5 0.0 - 0.7 K/uL   Basophils Relative 0 %   Basophils Absolute 0.0 0.0 - 0.1 K/uL    General: NAD. Vital signs reviewed.  Heart: RRR. No JVD. Lungs: Clear to auscultation, breathing unlabored. Abdomen: Positive bowel sounds, soft Musculoskeletal: No edema. No tenderness Neurologic:  Sensation: Hyperalgesia distal b/l knees Motor: 4+/5 in bilateral deltoid, bicep, tricep, grip, right hip flexor, knee extensors, ankle dorsiflexor and plantar flexor 4+/5 in LLE, hip flexor, knee extensor, ankle dorsiflexor and plantar flexor with some apraxia (unchanged) B/l LE slightly limited below knees due to neuropathy Skin: No evidence of breakdown, Intact Psych: Mood and affect are appropriate  Assessment/Plan: 1. Functional deficits secondary to Left frontal, left corona radiata infarct which require 3+ hours per day of interdisciplinary therapy in a comprehensive inpatient rehab setting. Physiatrist is providing close team supervision and 24 hour management of active medical problems listed below. Physiatrist and rehab team continue to assess barriers to discharge/monitor patient progress toward functional and medical goals. FIM: Function - Bathing Bathing activity did not occur: Refused Position: Wheelchair/chair at sink Body parts bathed by  patient: Right arm, Left arm, Chest, Abdomen Body parts bathed by helper: Back Bathing not applicable: Front perineal area, Buttocks, Right upper leg, Left upper leg, Right lower leg, Left lower leg Assist Level: Touching or steadying assistance(Pt > 75%)  Function- Upper Body Dressing/Undressing Upper body dressing/undressing activity did not occur: Refused What is the patient wearing?: Bra, Pull over shirt/dress (pajama gown) Bra - Perfomed by patient: Thread/unthread right bra strap, Thread/unthread left bra strap Bra - Perfomed by helper: Hook/unhook bra (pull down sports bra) Pull over shirt/dress - Perfomed by patient: Thread/unthread right sleeve, Thread/unthread  left sleeve, Put head through opening, Pull shirt over trunk Assist Level: Set up, Supervision or verbal cues Set up : To obtain clothing/put away Function - Lower Body Dressing/Undressing Lower body dressing/undressing activity did not occur: Refused What is the patient wearing?: Pants (pajama pants) Position: Sitting EOB Underwear - Performed by patient: Pull underwear up/down Underwear - Performed by helper: Thread/unthread right underwear leg Pants- Performed by patient: Pull pants up/down Pants- Performed by helper: Thread/unthread right pants leg, Thread/unthread left pants leg Non-skid slipper socks- Performed by helper: Don/doff right sock, Don/doff left sock Assist for footwear: Supervision/touching assist Assist for lower body dressing: Supervision or verbal cues, Set up Set up : To obtain clothing/put away  Function - Toileting Toileting activity did not occur: No continent bowel/bladder event Toileting steps completed by patient: Adjust clothing prior to toileting, Performs perineal hygiene, Adjust clothing after toileting Toileting steps completed by helper: Adjust clothing prior to toileting, Adjust clothing after toileting Maury: Grab bar or rail Assist level: Touching or steadying assistance (Pt.75%)  Function - Toilet Transfers Toilet transfer activity did not occur: N/A Toilet transfer assistive device: Walker, Grab bar (rollator) Assist level to toilet: Supervision or verbal cues Assist level from toilet: Supervision or verbal cues Assist level to bedside commode (at bedside): Touching or steadying assistance (Pt > 75%) Assist level from bedside commode (at bedside): Touching or steadying assistance (Pt > 75%)  Function - Chair/bed transfer Chair/bed transfer activity did not occur: N/A Chair/bed transfer method: Ambulatory Chair/bed transfer assist level: Touching or steadying assistance (Pt > 75%) Chair/bed transfer assistive device:  Walker, Armrests (rollator) Chair/bed transfer details: Verbal cues for technique, Verbal cues for precautions/safety  Function - Locomotion: Wheelchair Type: Manual Max wheelchair distance: 75 Assist Level: Touching or steadying assistance (Pt > 75%) Assist Level: Touching or steadying assistance (Pt > 75%) Function - Locomotion: Ambulation Assistive device: Walker-rolling (4WW) Max distance: 100 Assist level: Supervision or verbal cues Assist level: Supervision or verbal cues Assist level: Touching or steadying assistance (Pt > 75%) Walk 150 feet activity did not occur: Safety/medical concerns (per report) Assist level: Supervision or verbal cues Walk 10 feet on uneven surfaces activity did not occur: Safety/medical concerns Assist level: Supervision or verbal cues  Function - Comprehension Comprehension: Auditory Comprehension assist level: Understands basic 50 - 74% of the time/ requires cueing 25 - 49% of the time  Function - Expression Expression: Verbal Expression assist level: Expresses basic 75 - 89% of the time/requires cueing 10 - 24% of the time. Needs helper to occlude trach/needs to repeat words.  Function - Social Interaction Social Interaction assist level: Interacts appropriately 75 - 89% of the time - Needs redirection for appropriate language or to initiate interaction.  Function - Problem Solving Problem solving assist level: Solves basic 75 - 89% of the time/requires cueing 10 - 24% of the time  Function - Memory Memory assist level: Recognizes  or recalls 75 - 89% of the time/requires cueing 10 - 24% of the time Patient normally able to recall (first 3 days only): Current season, Location of own room, Staff names and faces, That he or she is in a hospital    C[place]ett 05/11/2016, 10:16 AM    Medical Problem List and Plan: 1. Gait disorder/apraxia with dysphagia/dysarthriasecondary to left frontal/corona radiata infarct secondary to small  vessel disease as well as history of CVA in the past with left side mild residual weakness  Cont CIR 2. Anticoagulation: Subcutaneous Lovenox. Monitor platelet counts and any signs of bleeding 3. Pain Management/hx of chronic low back pain  Cont topiramate started 2/4 for headaches- improved 4. Mood: Xanax 0.5 mg twice a day, Pamelor 25 mg daily at bedtime, Trilafon 2 mg daily at bedtime, Halcion 0.125 mg bedtime as needed, monitor for oversedation with the addition of topiramate 5. Neuropsych: This patient iscapable of making decisions on herown behalf. 6. Skin/Wound Care: Routine skin checks 7. Fluids/Electrolytes/Nutrition: Routine I&Os 8.Dysphagia. Dysphagia #1 thin liquids, advanced to D2 thin. Monitor hydration. Follow-up speech therapy Has hx of esophageal stricture  Cont protonix 9.Parkinson's disease. Sinemet 25-1003 times a day -monitor PD symptoms with therapy/activity and address as indicated.  10.Hyperlipidemia. Pravachol 11. Hypoalbuminemia  Supplement initiated 2/5 12. Nausea  Improved  Zofran increased to 58m, increased to q4 PRN 2/6  Will cont to monitor 13. Constipation  Increased reg again on 2/6  Improving  14. Acute lower UTI  UA+, Ucx E. Coli  Macrobid 2/8-2/14 15. Mild hyponatremia  Na 133 on 2/13  Cont to monitor 16. ABLA  Hb 11.2 on 2/12  Cont to monitor 17. Cough  Tessalon pearls added 2/13  LOS (Days): 11 A FACE TO FACE EVALUATION WAS PERFORMED

## 2016-05-11 NOTE — Plan of Care (Signed)
Problem: RH Bed Mobility Goal: LTG Patient will perform bed mobility with assist (PT) LTG: Patient will perform bed mobility with assistance, with/without cues (PT).  Downgraded ABG. Pt reports requiring assist with sit to supine PTA

## 2016-05-11 NOTE — Progress Notes (Signed)
Occupational Therapy Session Note  Patient Details  Name: Sarah [name] MRN: [id number] Date of Birth: Aug 17, 1926  Today's Date: 05/11/2016 OT Individual Time: [id number] OT Individual Time Calculation (min): 73 min    Short Term Goals: Week 2:  OT Short Term Goal 1 (Week 2): STGs=LTGS secondary to upcoming discharge  Skilled Therapeutic Interventions/Progress Updates:    Upon entering the room, pt supine in bed with NT present. Pt with no c/o pain this session but reports consistent coughing. Pt performed supine >sit with supervision. Pt ambulated to bathroom with rollator with supervision and performed hygiene and clothing management with supervision and min verbal cues for safety. Pt sitting on EOB to don clean gown and robe with set up A and close supervision for sit <>stand. Pt seated in wheelchair at sink for grooming tasks. Pt returned to bed at end of session secondary to fatigue and refused to remain up in chair. Call bell and all needed items within reach upon exiting the room.   Therapy Documentation Precautions:  Precautions Precautions: Fall Restrictions Weight Bearing Restrictions: No General:   Vital Signs: Therapy Vitals Temp: 98.5 F (36.9 C) Temp Source: Oral Pulse Rate: 79 Resp: 18 BP: (!) 122/39 Patient Position (if appropriate): Lying Oxygen Therapy SpO2: 99 % O2 Device: Not Delivered  See Function Navigator for Current Functional Status.   Therapy/Group: Individual Therapy  Gypsy Decant 05/11/2016, 4:10 PM

## 2016-05-12 ENCOUNTER — Inpatient Hospital Stay (HOSPITAL_COMMUNITY): Payer: Medicare Other | Admitting: Speech Pathology

## 2016-05-12 ENCOUNTER — Inpatient Hospital Stay (HOSPITAL_COMMUNITY): Payer: Medicare Other | Admitting: Occupational Therapy

## 2016-05-12 ENCOUNTER — Inpatient Hospital Stay (HOSPITAL_COMMUNITY): Payer: Medicare Other | Admitting: Physical Therapy

## 2016-05-12 DIAGNOSIS — H6121 Impacted cerumen, right ear: Secondary | ICD-10-CM

## 2016-05-12 DIAGNOSIS — R110 Nausea: Secondary | ICD-10-CM

## 2016-05-12 LAB — URINALYSIS, ROUTINE W REFLEX MICROSCOPIC
Bilirubin Urine: NEGATIVE
GLUCOSE, UA: NEGATIVE mg/dL
HGB URINE DIPSTICK: NEGATIVE
Ketones, ur: 5 mg/dL — AB
LEUKOCYTES UA: NEGATIVE
Nitrite: NEGATIVE
Protein, ur: NEGATIVE mg/dL
SPECIFIC GRAVITY, URINE: 1.008 (ref 1.005–1.030)
pH: 5 (ref 5.0–8.0)

## 2016-05-12 MED ORDER — CARBAMIDE PEROXIDE 6.5 % OT SOLN
5.0000 [drp] | Freq: Two times a day (BID) | OTIC | Status: AC
  Administered 2016-05-12 (×2): 5 [drp] via OTIC
  Filled 2016-05-12: qty 15

## 2016-05-12 NOTE — Progress Notes (Signed)
Patient complained of burning with urinating. Patient reported frequency urinating and stress incontinence after coughing. Dr. Posey Pronto notified. Orders received. Continue with plan of care.  Sarah [name]

## 2016-05-12 NOTE — Patient Care Conference (Signed)
Inpatient RehabilitationTeam Conference and Plan of Care Update Date: 05/12/2016   Time: 11:35 AM    Patient Name: Sarah [name]      Medical Record Number: [id number]  Date of Birth: 06/11/1926 Sex: Female         Room/Bed: 4W08C/4W08C-01 Payor Info: Payor: MEDICARE / Plan: MEDICARE PART A AND B / Product Type: *No Product type* /    Admitting Diagnosis: R CVA  Admit Date/Time:  04/30/2016  5:11 PM Admission Comments: No comment available   Primary Diagnosis:  <principal problem not specified> Principal Problem: <principal problem not specified>  Patient Active Problem List   Diagnosis Date Noted  . Nausea without vomiting   . Impacted cerumen of right ear   . Cough   . Acute blood loss anemia   . Generalized anxiety disorder   . Acute lower UTI   . Non-intractable vomiting with nausea   . Vascular headache   . Intractable vomiting with nausea   . Slow transit constipation   . Small vessel disease, cerebrovascular 04/30/2016  . Acute CVA (cerebrovascular accident) ([place])   . Dysphagia, post-stroke   . History of cerebrovascular accident (CVA) with residual deficit   . Benign essential HTN   . Slurred speech 04/27/2016  . CVA (cerebral vascular accident) ([place]) 04/27/2016  . Hypertension 04/27/2016  . Dysarthria 04/27/2016  . History of stroke 04/27/2016  . Acute focal neurological deficit 04/27/2016  . Pansinusitis 10/20/2015  . Primary Parkinsonism ([place]) 09/26/2015  . Peripheral edema 05/12/2015  . DOE (dyspnea on exertion) 02/12/2015  . Neck pain 01/10/2015  . Routine general medical examination at a health care facility 09/17/2014  . Hyperglycemia 07/09/2014  . Acute ischemic right MCA stroke ([place]) 07/03/2014  . Degenerative arthritis of knee, bilateral 03/01/2014  . Fall 10/05/2013  . Hyponatremia 10/05/2013  . Memory deficits 10/05/2013  . Arthritis of sacroiliac joint ([place]) 09/11/2013  . Lumbosacral spondylosis without myelopathy 02/26/2013  . Abnormality of  gait 12/15/2012  . Right shoulder pain   . Vitamin B12 deficiency 04/13/2012  . Hypercholesterolemia 11/05/2011  . Osteoporosis, postmenopausal 06/02/2011  . Mixed incontinence urge and stress 06/02/2011  . Anxiety   . Edema 04/06/2011  . Tachycardia 04/06/2011  . Osteoarthritis     Expected Discharge Date: Expected Discharge Date: 05/13/16  Team Members Present: Physician leading conference: Dr. Delice Lesch Social Worker Present: Lennart Pall, LCSW Nurse Present: Heather Roberts, RN PT Present: Jorge Mandril, PT;Other (comment) (Rosita Dechalus, PTA) OT Present: Benay Pillow, OT SLP Present: Weston Anna, SLP PPS Coordinator present : Daiva Nakayama, RN, CRRN     Current Status/Progress Goal Weekly Team Focus  Medical   Gait disorder/apraxia with dysphagia/dysarthria secondary to left frontal/ corona radiata infarct secondary to small vessel disease as well as history of CVA in the past with left side mild residual weakness  Improve mobility, transfers, nausea, headache  See above   Bowel/Bladder   Continent of bowel LMB 2/13. Incontinent of bladder at times- wears depends, also on macrobid for UTI  Be continent of B/B   monitor   Swallow/Nutrition/ Hydration   Dys. 2 textures with thin liquids, supervision-Mod I   Mod I  patient/family education    ADL's   Supervision/set up for grooming, toileting, and functional transfers. Min A for LB self care.   min A for shower transfers, bathing,and LB dressing. Supervision overall for all other tasks.  d/c planning, endurance, self care, pt education   Mobility   supervision bed mobilty  and transfers, gait 180ft with rollator  supervison with functional mobility   gait, endurance, activity tolerance    Communication   Supervision  supervision  Family education    Safety/Cognition/ Behavioral Observations  Supervision-Min A  supervision  Family education    Pain   pain 5-7 out of 10- headache manage at times with tylenol PRN  <3   assess and treat daily/PRN   Skin   Abrasion to Lt shin with tegaderm in place-healing, ecchymosis to abd, BUE  maintain skin integrity with min assist  assess q shift    Rehab Goals Patient on target to meet rehab goals: Yes *See Care Plan and progress notes for long and short-term goals.  Barriers to Discharge: Mobility, safety, transfers, nausea, headaches, UTI    Possible Resolutions to Barriers:  Therapies, adjust expectations for symptoms, abx    Discharge Planning/Teaching Needs:  Plan for pt to return to her home with additional care being arranged in order to cover 24/7 assistance  caregiver, [name], has been attending therapies   Team Discussion: Medically stable but still with same daily c/o cough.  Constipated and RN to address before she discharges.  Upgraded diet t D2, thin.  At goals of overall min assist and ready for d/c tomorrow.  Revisions to Treatment Plan:  Diet upgrade   Continued Need for Acute Rehabilitation Level of Care: The patient requires daily medical management by a physician with specialized training in physical medicine and rehabilitation for the following conditions: Daily direction of a multidisciplinary physical rehabilitation program to ensure safe treatment while eliciting the highest outcome that is of practical value to the patient.: Yes Daily medical management of patient stability for increased activity during participation in an intensive rehabilitation regime.: Yes Daily analysis of laboratory values and/or radiology reports with any subsequent need for medication adjustment of medical intervention for : Neurological problems;Other  Derenda Giddings 05/12/2016, 2:32 PM

## 2016-05-12 NOTE — Discharge Summary (Signed)
Discharge summary job # [id number]

## 2016-05-12 NOTE — Progress Notes (Signed)
Subjective/Complaints: Pt seen laying in bed this AM. She states she continues to have a cough, headache, and nausea.  Reminded pt she has meds for cough if she needs them, but she states he is unaware and was never given any (pt received x2 yesterday).  She also complains of wax in her right ear.  ROS: +Cough, headache, nausea. Denies CP, SOB, V/D.  Objective: Vital Signs: Blood pressure (!) 122/48, pulse 78, temperature 98.7 F (37.1 C), temperature source Oral, resp. rate 16, height 5' (1.524 m), weight 74.4 kg (164 lb), SpO2 99 %. No results found. Results for orders placed or performed during the hospital encounter of 04/30/16 (from the past 72 hour(s))  Basic metabolic panel     Status: Abnormal   Collection Time: 05/10/16 11:48 AM  Result Value Ref Range   Sodium 133 (L) 135 - 145 mmol/L   Potassium 3.9 3.5 - 5.1 mmol/L   Chloride 103 101 - 111 mmol/L   CO2 21 (L) 22 - 32 mmol/L   Glucose, Bld 95 65 - 99 mg/dL   BUN 13 6 - 20 mg/dL   Creatinine, Ser 0.84 0.44 - 1.00 mg/dL   Calcium 8.8 (L) 8.9 - 10.3 mg/dL   GFR calc non Af Amer >60 >60 mL/min   GFR calc Af Amer >60 >60 mL/min    Comment: (NOTE) The eGFR has been calculated using the CKD EPI equation. This calculation has not been validated in all clinical situations. eGFR's persistently <60 mL/min signify possible Chronic Kidney Disease.    Anion gap 9 5 - 15  CBC with Differential/Platelet     Status: Abnormal   Collection Time: 05/10/16 11:48 AM  Result Value Ref Range   WBC 5.6 4.0 - 10.5 K/uL   RBC 3.77 (L) 3.87 - 5.11 MIL/uL   Hemoglobin 11.2 (L) 12.0 - 15.0 g/dL   HCT 34.9 (L) 36.0 - 46.0 %   MCV 92.6 78.0 - 100.0 fL   MCH 29.7 26.0 - 34.0 pg   MCHC 32.1 30.0 - 36.0 g/dL   RDW 13.6 11.5 - 15.5 %   Platelets 356 150 - 400 K/uL   Neutrophils Relative % 63 %   Neutro Abs 3.6 1.7 - 7.7 K/uL   Lymphocytes Relative 15 %   Lymphs Abs 0.8 0.7 - 4.0 K/uL   Monocytes Relative 12 %   Monocytes Absolute 0.7 0.1 - 1.0  K/uL   Eosinophils Relative 10 %   Eosinophils Absolute 0.5 0.0 - 0.7 K/uL   Basophils Relative 0 %   Basophils Absolute 0.0 0.0 - 0.1 K/uL    General: NAD. Vital signs reviewed.  Heart: RRR. No JVD. Lungs: Clear to auscultation, breathing unlabored. Abdomen: Positive bowel sounds, soft Musculoskeletal: No edema. No tenderness Neurologic:  Sensation: Hyperalgesia distal b/l knees Motor: 4+/5 in bilateral deltoid, bicep, tricep, grip, right hip flexor, knee extensors, ankle dorsiflexor and plantar flexor 4+/5 in LLE, hip flexor, knee extensor, ankle dorsiflexor and plantar flexor with some apraxia (stable) B/l LE slightly limited below knees due to neuropathy Skin: No evidence of breakdown, Intact Psych: Mood and affect are appropriate  Assessment/Plan: 1. Functional deficits secondary to Left frontal, left corona radiata infarct which require 3+ hours per day of interdisciplinary therapy in a comprehensive inpatient rehab setting. Physiatrist is providing close team supervision and 24 hour management of active medical problems listed below. Physiatrist and rehab team continue to assess barriers to discharge/monitor patient progress toward functional and medical goals. FIM: Function -  Bathing Bathing activity did not occur: Refused Position: Sitting EOB Body parts bathed by patient: Right arm, Left arm, Chest, Abdomen Body parts bathed by helper:  (UB only) Bathing not applicable: Back Assist Level: Set up, Supervision or verbal cues Set up : To obtain items  Function- Upper Body Dressing/Undressing Upper body dressing/undressing activity did not occur: Refused What is the patient wearing?: Pull over shirt/dress Bra - Perfomed by patient: Thread/unthread right bra strap, Thread/unthread left bra strap Bra - Perfomed by helper: Hook/unhook bra (pull down sports bra) Pull over shirt/dress - Perfomed by patient: Thread/unthread right sleeve, Thread/unthread left sleeve, Put head  through opening, Pull shirt over trunk Assist Level: Set up, Supervision or verbal cues Set up : To obtain clothing/put away Function - Lower Body Dressing/Undressing Lower body dressing/undressing activity did not occur: Refused What is the patient wearing?: Pants (pajama pants) Position: Sitting EOB Underwear - Performed by patient: Pull underwear up/down Underwear - Performed by helper: Thread/unthread right underwear leg Pants- Performed by patient: Pull pants up/down Pants- Performed by helper: Thread/unthread right pants leg, Thread/unthread left pants leg Non-skid slipper socks- Performed by helper: Don/doff right sock, Don/doff left sock Assist for footwear: Supervision/touching assist Assist for lower body dressing: Supervision or verbal cues, Set up Set up : To obtain clothing/put away  Function - Toileting Toileting activity did not occur: No continent bowel/bladder event Toileting steps completed by patient: Performs perineal hygiene, Adjust clothing after toileting Toileting steps completed by helper: Adjust clothing prior to toileting Toileting Assistive Devices: Grab bar or rail Assist level: Supervision or verbal cues  Function - Toilet Transfers Toilet transfer activity did not occur: N/A Toilet transfer assistive device: Walker, Grab bar Assist level to toilet: Supervision or verbal cues Assist level from toilet: Supervision or verbal cues Assist level to bedside commode (at bedside): Touching or steadying assistance (Pt > 75%) Assist level from bedside commode (at bedside): Touching or steadying assistance (Pt > 75%)  Function - Chair/bed transfer Chair/bed transfer activity did not occur: N/A Chair/bed transfer method: Ambulatory Chair/bed transfer assist level: Supervision or verbal cues Chair/bed transfer assistive device: Armrests, Walker Chair/bed transfer details: Verbal cues for technique, Verbal cues for precautions/safety  Function - Locomotion:  Wheelchair Type: Manual Max wheelchair distance: 75 Assist Level: Touching or steadying assistance (Pt > 75%) Assist Level: Touching or steadying assistance (Pt > 75%) Function - Locomotion: Ambulation Assistive device: Walker-rolling Max distance: 15 Assist level: Supervision or verbal cues Assist level: Supervision or verbal cues Assist level: Touching or steadying assistance (Pt > 75%) Walk 150 feet activity did not occur: Safety/medical concerns (per report) Assist level: Supervision or verbal cues Walk 10 feet on uneven surfaces activity did not occur: Safety/medical concerns Assist level: Supervision or verbal cues  Function - Comprehension Comprehension: Auditory Comprehension assist level: Understands basic 90% of the time/cues < 10% of the time  Function - Expression Expression: Verbal Expression assist level: Expresses basic 75 - 89% of the time/requires cueing 10 - 24% of the time. Needs helper to occlude trach/needs to repeat words.  Function - Social Interaction Social Interaction assist level: Interacts appropriately 75 - 89% of the time - Needs redirection for appropriate language or to initiate interaction.  Function - Problem Solving Problem solving assist level: Solves basic 75 - 89% of the time/requires cueing 10 - 24% of the time  Function - Memory Memory assist level: Recognizes or recalls 75 - 89% of the time/requires cueing 10 - 24% of the time Patient normally  able to recall (first 3 days only): Current season, Location of own room, Staff names and faces, That he or she is in a hospital    Butler 05/12/2016, 9:32 AM    Medical Problem List and Plan: 1. Gait disorder/apraxia with dysphagia/dysarthriasecondary to left frontal/corona radiata infarct secondary to small vessel disease as well as history of CVA in the past with left side mild residual weakness  Cont CIR 2. Anticoagulation: Subcutaneous Lovenox. Monitor platelet counts and any  signs of bleeding 3. Pain Management/hx of chronic low back pain  Cont topiramate started 2/4 for headaches- believe this has improved 4. Mood: Xanax 0.5 mg twice a day, Pamelor 25 mg daily at bedtime, Trilafon 2 mg daily at bedtime, Halcion 0.125 mg bedtime as needed, monitor for oversedation with the addition of topiramate 5. Neuropsych: This patient iscapable of making decisions on herown behalf. 6. Skin/Wound Care: Routine skin checks 7. Fluids/Electrolytes/Nutrition: Routine I&Os 8.Dysphagia. Dysphagia #1 thin liquids, advanced to D2 thin. Monitor hydration. Follow-up speech therapy Has hx of esophageal stricture  Cont protonix 9.Parkinson's disease. Sinemet 25-1003 times a day -monitor PD symptoms with therapy/activity and address as indicated.  10.Hyperlipidemia. Pravachol 11. Hypoalbuminemia  Supplement initiated 2/5 12. Nausea  Believe this has improved  Zofran increased to 71m, increased to q4 PRN 2/6  Will cont to monitor 13. Constipation  Increased reg again on 2/6  Improving  14. Acute lower UTI  UA+, Ucx E. Coli  Macrobid 2/8-2/14 15. Mild hyponatremia  Na 133 on 2/12  Cont to monitor 16. ABLA  Hb 11.2 on 2/12  Cont to monitor 17. Cough  Tessalon pearls added 2/13  Believe this is improving 18. Ear wax  Debrox ordered 2/14  LOS (Days): 12 A FACE TO FACE EVALUATION WAS PERFORMED

## 2016-05-12 NOTE — Progress Notes (Deleted)
Physical Therapy Discharge Summary  Patient Details  Name: NIKAYLA MADARIS MRN: [id number] Date of Birth: 05-12-1926  Today's Date: 05/12/2016 PT Individual Time: 1300-1405 PT Individual Time Calculation (min): 65 min    Patient has met 8 of 9 long term goals due to improved activity tolerance, improved balance and increased strength.  Patient to discharge at an ambulatory level Supervision.   Patient's care partner is independent to provide the necessary physical assistance at discharge.  Reasons goals not met: Pt's poor endurance to participate in stair training  Recommendation:  Patient will benefit from ongoing skilled PT services in home health setting to continue to advance safe functional mobility, address ongoing impairments in LE strength, balance and endurance tolerance, and minimize fall risk.  Equipment: No equipment provided  Reasons for discharge: treatment goals met  Patient/family agrees with progress made and goals achieved: Yes  PT Discharge Precautions/Restrictions Precautions Precautions: Fall (Simultaneous filing. User may not have seen previous data.) Restrictions Weight Bearing Restrictions: No Vital Signs  Pain Pain Assessment Pain Assessment: No/denies pain Vision/Perception     Cognition Arousal/Alertness: Awake/alert Orientation Level: Oriented X4 Attention: Sustained Sustained Attention: Appears intact Safety/Judgment: Appears intact Sensation   Motor  Motor Motor - Discharge Observations: hemiparesis but improved since initial evaluation  Mobility Bed Mobility Bed Mobility: Rolling Right;Rolling Left;Supine to Sit;Sit to Supine Rolling Right: 5: Supervision Rolling Left: 5: Supervision Supine to Sit: 5: Supervision Sit to Supine: 5: Supervision Transfers Sit to Stand: 5: Supervision Stand to Sit: 5: Supervision Stand to Sit Details (indicate cue type and reason): Verbal cues for precautions/safety Locomotion   Ambulation Ambulation/Gait Assistance: 5: Supervision Ambulation Distance (Feet): 50 Feet Assistive device: Rollator Gait Gait: Yes Gait Pattern: Within Functional Limits Gait Pattern: Step-through pattern;Trunk flexed;Decreased step length - left;Decreased step length - right  Trunk/Postural Assessment     Balance Dynamic Sitting Balance Dynamic Sitting - Balance Support: No upper extremity supported;Feet supported Dynamic Sitting - Level of Assistance: 6: Modified independent (Device/Increase time) Dynamic Standing Balance Dynamic Standing - Balance Support: No upper extremity supported Dynamic Standing - Level of Assistance: 5: Stand by assistance Extremity Assessment      RLE Assessment RLE Assessment: Within Functional Limits LLE Assessment LLE Assessment: Within Functional Limits   See Function Navigator for Current Functional Status.  Jaylia Pettus  Cannen Dupras, PTA 05/12/2016, 4:39 PM

## 2016-05-12 NOTE — Progress Notes (Signed)
Physical Therapy Session Note  Patient Details  Name: Sarah [name] MRN: [id number] Date of Birth: [birth year]  Today's Date: 05/12/2016 PT Individual Time: 1300-1405 PT Individual Time Calculation (min): 65 min   Short Term Goals: Week 2:  PT Short Term Goal 1 (Week 2): = LTG of supervision overall  Skilled Therapeutic Interventions/Progress Updates: Pt presented in bed requiring encouragement to participate in therapy. Performed supine to sit with supervision. Stand pivot transfer to w/c supervision. Performed car transfer and gait with supervision and additional time required. Pt required additional breaks due to c/o increase HA today and nausea. Pt unable to perform stairs today due to fatigue. Returned to room and performed stand pivot transfer to bed and sit to supine with supervision. Pt remained in bed at end of session with call bell within reach and need met.      Therapy Documentation Precautions:  Precautions Precautions: Fall Restrictions Weight Bearing Restrictions: No General:     See Function Navigator for Current Functional Status.   Therapy/Group: Individual Therapy  Cailah Reach  Mackey Varricchio, PTA  05/12/2016, 4:24 PM

## 2016-05-12 NOTE — Progress Notes (Signed)
Physical Therapy Discharge Summary  Patient Details  Name: Sarah [name] MRN: [id number] Date of Birth: [birth year]  Today's Date: 05/12/2016   Patient has met 9 of 10  long term goals due to improved activity tolerance, improved balance and increased strength.  Patient to discharge at an ambulatory level Supervision.   Patient's care partner is independent to provide the necessary physical assistance at discharge.  Reasons goals not met: Stair training partially met, demonstrated good functional strength otherwise.    Recommendation:  Patient will benefit from ongoing skilled PT services in home health setting to continue to advance safe functional mobility, address ongoing impairments in strength, balance, endurance, activity tolerance, cognition, gait, and minimize fall risk.  Equipment: No equipment provided - pt has equipment at home   Reasons for discharge: treatment goals met  Patient/family agrees with progress made and goals achieved: Yes  PT Discharge Precautions/Restrictions Precautions Precautions: Fall (Simultaneous filing. User may not have seen previous data.) Restrictions Weight Bearing Restrictions: No  Cognition Arousal/Alertness: Awake/alert Orientation Level: Oriented X4 Attention: Sustained Sustained Attention: Appears intact Safety/Judgment: Appears intact Motor  Motor Motor - Discharge Observations: hemiparesis but improved since initial evaluation  Mobility Bed Mobility Bed Mobility: Rolling Right;Rolling Left;Supine to Sit;Sit to Supine Rolling Right: 5: Supervision Rolling Left: 5: Supervision Supine to Sit: 5: Supervision Sit to Supine: 5: Supervision Transfers Sit to Stand: 5: Supervision Stand to Sit: 5: Supervision Stand to Sit Details (indicate cue type and reason): Verbal cues for precautions/safety Locomotion  Ambulation Ambulation/Gait Assistance: 5: Supervision Ambulation Distance (Feet): 50 Feet Assistive device:  Rollator Gait Gait: Yes Gait Pattern: Within Functional Limits Gait Pattern: Step-through pattern;Trunk flexed;Decreased step length - left;Decreased step length - right     Balance Dynamic Sitting Balance Dynamic Sitting - Balance Support: No upper extremity supported;Feet supported Dynamic Sitting - Level of Assistance: 6: Modified independent (Device/Increase time) Dynamic Standing Balance Dynamic Standing - Balance Support: No upper extremity supported Dynamic Standing - Level of Assistance: 5: Stand by assistance Extremity Assessment      RLE Assessment RLE Assessment: Within Functional Limits LLE Assessment LLE Assessment: Within Functional Limits   See Function Navigator for Current Functional Status.  Rosita DeChalus  Rosita DeChalus, PTA  05/12/2016, 4:45 PM   Lars Masson, PT, DPT 8:08 AM 05/13/16

## 2016-05-12 NOTE — Progress Notes (Signed)
Speech Language Pathology Daily Session Notes  Patient Details  Name: Sarah [name] MRN: [id number] Date of Birth: [birth year]  Today's Date: 05/12/2016  Session 1: SLP Individual Time: 1030-1055 SLP Individual Time Calculation (min): 25 min   Session 2: SLP Individual Time: 1430-1500 SLP Individual Time Calculation (min): 30 min  Short Term Goals: Week 2: SLP Short Term Goal 1 (Week 2): Patient will consume trials of Dys.2 textures over 2 sessions, with Mod I recall to alternate solids and liquids to effectively prevent s/s of regurgitation prior to upgrade.  SLP Short Term Goal 2 (Week 2): Patient will consume current diet without overt s/s of aspiration and Mod I for use of swallowing compenstory strategies.  SLP Short Term Goal 3 (Week 2): Patient will utilize speech intelligibility strategies at the sentence level with supervision verbal cues.  SLP Short Term Goal 4 (Week 2): Patient will utilize comepsatory strategies for short term recall of new and daily information with Min assist question cues.   Skilled Therapeutic Interventions:  Session 1: Skilled treatment session focused on cognitive goals. Upon arrival, patient was awake while upright in bed and reported she was "sick." Patient reported a dry, hacking cough that MD is aware of and addressing. Patient also appeared flush and reported she felt warm, but current tempature was [facility] when checked. RN made aware. SLP facilitated session by educating patient on memory compensatory strategies and patient required supervision verbal cues for how he can incorporate strategies at home. Patient left upright in bed with all needs within reach. Continue with current plan of care.   Session 2: Skilled treatment session focused on dysphagia goals. SLP facilitated session by providing education to the patient in regards to current swallowing function, diet recommendations, appropriate textures, compensatory strategies and medication  administration. Handouts were also given to reinforce information. Patient verbalized understanding of all information. Continue with current plan of care.   Function:   Cognition Comprehension Comprehension assist level: Understands basic 90% of the time/cues < 10% of the time  Expression   Expression assist level: Expresses basic 90% of the time/requires cueing < 10% of the time.  Social Interaction Social Interaction assist level: Interacts appropriately 90% of the time - Needs monitoring or encouragement for participation or interaction.  Problem Solving Problem solving assist level: Solves basic 75 - 89% of the time/requires cueing 10 - 24% of the time  Memory Memory assist level: Recognizes or recalls 75 - 89% of the time/requires cueing 10 - 24% of the time    Pain No/Denies Pain   Therapy/Group: Individual Therapy  Jayleena Stille 05/12/2016, 11:25 AM

## 2016-05-12 NOTE — Progress Notes (Signed)
Occupational Therapy Discharge Summary  Patient Details  Name: Sarah [name] MRN: [id number] Date of Birth: 1926/09/12  Today's Date: 05/12/2016 OT Individual Time: 0165-5374 OT Individual Time Calculation (min): 38 min    Patient has met 11 of 11 long term goals due to improved activity tolerance, improved balance, postural control, ability to compensate for deficits and improved coordination.  Patient to discharge at overall supervision - min A level. Family not present for training with OT but pt has 24/7 paid caregivers. Her primary caregiver has attended physical therapy sessions.   Reasons goals not met: all goals met  Recommendation:  Patient will benefit from ongoing skilled OT services in home health setting to continue to advance functional skills in the area of BADL.  Equipment: No equipment provided  Reasons for discharge: treatment goals met  Patient/family agrees with progress made and goals achieved: Yes   OT Intervention: Upon entering the room, pt supine in bed upon entering the room. Pt with c/o nausea and cough. Pt does appear to be flush. Temp is WFLs and RN notified. OT discussed discharge recommendations with pt who verbalized understanding. Pt performed sit <>stand from bed with supervision. Pt ambulating with rollator to bathroom for toileting at overall supervision level. While seated on toilet, pt changing brief with steady assistance while threading B LE's . Pt standing and pulling pants over B hips with close supervision. Pt returning to room and continues to report feeling unwell. RN present with medications. Pt remains in bed and declined further intervention at this time. Call bell and all needed items within reach.   OT Discharge Precautions/Restrictions  Precautions Precautions: Fall (Simultaneous filing. User may not have seen previous data.) Restrictions Weight Bearing Restrictions: No  Pain Pain Assessment Pain Assessment: No/denies  pain Vision/Perception  Vision- History Patient Visual Report: No change from baseline  Cognition Arousal/Alertness: Awake/alert Orientation Level: Oriented X4 Attention: Sustained Sustained Attention: Appears intact Safety/Judgment: Appears intact Motor  Motor Motor - Discharge Observations: hemiparesis but improved since initial evaluation Mobility  Bed Mobility Bed Mobility: Rolling Right;Rolling Left;Supine to Sit;Sit to Supine Rolling Right: 5: Supervision Rolling Left: 5: Supervision Supine to Sit: 5: Supervision Sit to Supine: 5: Supervision Transfers Sit to Stand: 5: Supervision Stand to Sit: 5: Supervision Stand to Sit Details (indicate cue type and reason): Verbal cues for precautions/safety   Balance Dynamic Sitting Balance Dynamic Sitting - Balance Support: No upper extremity supported;Feet supported Dynamic Sitting - Level of Assistance: 6: Modified independent (Device/Increase time) Dynamic Standing Balance Dynamic Standing - Balance Support: No upper extremity supported Dynamic Standing - Level of Assistance: 5: Stand by assistance Extremity/Trunk Assessment RUE Assessment RUE Assessment:  (utilized as dominant side. 3+/5 throughout) LUE Assessment LUE Assessment:  (residual weakness from prior CVA)   See Function Navigator for Current Functional Status.  Gypsy Decant 05/12/2016, 4:35 PM

## 2016-05-13 DIAGNOSIS — R300 Dysuria: Secondary | ICD-10-CM

## 2016-05-13 MED ORDER — OFLOXACIN 0.3 % OP SOLN: 1.0000 [drp] | Freq: Four times a day (QID) | OPHTHALMIC | 0 refills | Status: DC

## 2016-05-13 MED ORDER — ALPRAZOLAM 0.5 MG PO TABS: 0.5000 mg | ORAL_TABLET | Freq: Two times a day (BID) | ORAL | 0 refills | Status: DC

## 2016-05-13 MED ORDER — NORTRIPTYLINE HCL 25 MG PO CAPS: 25.0000 mg | ORAL_CAPSULE | Freq: Every day | ORAL | 3 refills | Status: DC

## 2016-05-13 MED ORDER — PRAVASTATIN SODIUM 20 MG PO TABS: ORAL_TABLET | ORAL | 3 refills | Status: DC

## 2016-05-13 MED ORDER — DICLOFENAC SODIUM 1 % TD GEL: TRANSDERMAL | 5 refills | Status: DC

## 2016-05-13 MED ORDER — POLYETHYLENE GLYCOL 3350 17 G PO PACK: 17.0000 g | PACK | Freq: Every day | ORAL | 0 refills | Status: AC

## 2016-05-13 MED ORDER — CLOPIDOGREL BISULFATE 75 MG PO TABS: 75.0000 mg | ORAL_TABLET | Freq: Every day | ORAL | 2 refills | Status: DC

## 2016-05-13 MED ORDER — PANTOPRAZOLE SODIUM 40 MG PO TBEC: 40.0000 mg | DELAYED_RELEASE_TABLET | Freq: Every day | ORAL | 11 refills | Status: DC

## 2016-05-13 MED ORDER — TOPIRAMATE 25 MG PO TABS: 25.0000 mg | ORAL_TABLET | Freq: Two times a day (BID) | ORAL | 0 refills | Status: DC

## 2016-05-13 MED ORDER — BENZONATATE 100 MG PO CAPS: 100.0000 mg | ORAL_CAPSULE | Freq: Two times a day (BID) | ORAL | 0 refills | Status: DC | PRN

## 2016-05-13 NOTE — Progress Notes (Addendum)
Social Work  Discharge Note  The overall goal for the admission was met for:   Discharge location: Yes - home with 24/7 private duty caregivers  Length of Stay: Yes - 13 days  Discharge activity level: Yes - supervision overall  Home/community participation: Yes  Services provided included: MD, RD, PT, OT, SLP, RN, TR, Pharmacy and SW  Financial Services: Medicare and Private Insurance: [place]  Follow-up services arranged: Home Health: PT, OT, ST via [facility] and Patient/Family has no preference for HH/DME agencies.  Also ordered 3n1 commode via [place] (or additional information):  Patient/Family verbalized understanding of follow-up arrangements: Yes  Individual responsible for coordination of the follow-up plan: pt/daughter  Confirmed correct DME delivered: NA - had all needed DME    Sarah [name]

## 2016-05-13 NOTE — Discharge Summary (Signed)
Sarah [name], Sarah [name] NO.:  [id number]  MEDICAL RECORD NO.:  [id number]  LOCATION:  4W08C                        FACILITY:  [facility]  PHYSICIAN:  Delice Lesch, MD        DATE OF BIRTH:  09-17-1926  DATE OF ADMISSION:  04/30/2016 DATE OF DISCHARGE:  05/13/2016                              DISCHARGE SUMMARY   DISCHARGE DIAGNOSES: 1. Left frontal corona radiata infarct secondary to small vessel     disease. 2. Subcutaneous Lovenox for deep venous thrombosis prophylaxis. 3. Chronic pain of the low back. 4. Depression. 5. Dysphagia. 6. Parkinson's disease. 7. Hyperlipidemia. 8. Constipation, resolved. 9. Decreased nutritional storage. 10.Escherichia coli urinary tract infection. 11.Mild hyponatremia.  HISTORY OF PRESENT ILLNESS:  This 83 year old right-handed female, history of Parkinson's disease as well as history of CVA with residual left-sided weakness, maintained on Plavix.  She lives with her son and daughter, used an assistive device prior to admission.  She has a home health aide from 10 a.m. to 6 p.m. 7 days a week.  She needed some assistance with ADLs prior to admission.  Presented on April 27, 2016, with slurred speech, hypertensive 190/70, mild hyponatremia 132, troponin negative.  Cranial CT scan, unremarkable for acute process, she did not receive tPA.  MRI showed acute-to-early subacute infarct within the left frontal centrum semiovale.  No acute hemorrhage.  MRA with no evidence of large vessel occlusion or aneurysm.  Carotid Dopplers with no ICA stenosis.  Echocardiogram with ejection fraction of 123456, grade 1 diastolic dysfunction.  Neurology consulted, maintained on aspirin and Plavix for CVA prophylaxis x3 months, then Plavix alone.  Subcutaneous Lovenox added for DVT prophylaxis.  Maintained on a dysphagia #1 nectar thick liquid diet.  Physical and occupational therapy ongoing.  The patient was admitted for a comprehensive rehab  program.  PAST MEDICAL HISTORY:  See discharge diagnoses.  SOCIAL HISTORY:  She has a home health aide 7 days a week from 10 a.m. to 6 p.m.  Used an assistive device prior to admission.  FUNCTIONAL STATUS:  Upon admission to [facility] was minimal assist, 20 feet with a 4-wheeled rolling walker; moderate assist, sit to supine; min-to-mod assist, activities of daily living.  PHYSICAL EXAMINATION:  VITAL SIGNS:  Blood pressure 118/54, pulse 85, temperature 98, respirations 18. GENERAL:  This was an alert female, dysarthric, but intelligible, followed simple commands, some delay in processing, mild left facial droop. HEENT:  Pupils round and reactive to light. NECK:  Supple.  Nontender.  No JVD. LUNGS:  Clear to auscultation without wheeze. CARDIAC:  Regular rate and rhythm without murmur. ABDOMEN:  Soft, nontender.  Good bowel sounds.  REHABILITATION HOSPITAL COURSE:  The patient was admitted to Inpatient Rehab Services with therapies initiated on a 3-hour daily basis consisting of physical therapy, occupational therapy, speech therapy and rehabilitation nursing.  The following issues were addressed during the patient's rehabilitation stay.  Pertaining to [name], left frontal corona radiata infarct remained stable, maintained on aspirin and Plavix therapy.  She would follow up Neurology Services.  Subcutaneous Lovenox for DVT prophylaxis, no bleeding episodes.  History of chronic low back pain.  Intermittent headaches.  She was  maintained on Topamax.  She had some insomnia, doing well with Halcion.  Sinemet ongoing for history of Parkinson's disease.  Again, she would follow up outpatient Neurology. Her diet was advanced to a dysphagia #2 thin liquid.  Close monitoring of hydration, noted history of esophageal stricture.  Constipation, resolved with laxative assistance.  She completed a course of Macrobid, May 06, 2016 until May 12, 2016 for E. coli UTI.  No  dysuria or hematuria.  The patient received weekly collaborative interdisciplinary team conferences to discuss estimated length of stay, family teaching, any barriers to her discharge.  She was ambulating 115 feet with rolling walker, performed sit to supine with supervision.  She could ambulate to the toilet using her rolling walker, supervision again for toilet transfers.  She could don and doff her clothing, sitting at edge of bed.  She could communicate her full needs.  Full family teaching was completed and plan discharge to home.  DISCHARGE MEDICATIONS:  Included: 1. Xanax 0.5 mg p.o. b.i.d. 2. Aspirin 324 mg daily. 3. Sinemet 25/100 mg one p.o. t.i.d. 4. Plavix 75 mg p.o. daily. 5. Pamelor 25 mg p.o. at bedtime. 6. Ocuflox 0.3%, one drop right eye q.i.d. 7. Protonix 40 mg p.o. daily. 8. Trilafon 2 mg p.o. at bedtime. 9. MiraLax daily, hold for loose stool. 10.Pravachol 10 mg p.o. daily. 11.Topamax 25 mg p.o. B.i.d. 12. Tessalon capsule 100 mg twice daily as needed cough  DIET:  Dysphagia #2 thin liquids.  FOLLOWUP:  She would follow up with Dr. Posey Pronto, Outpatient Rehab Services as directed; Dr. Erlinda Hong, Neurology Service, 1 month, call for appointment; Dr. Pricilla Holm, medical management.     [name], P.A.   ______________________________ Delice Lesch, MD    DA/MEDQ  D:  05/12/2016  T:  05/13/2016  Job:  [id number]  cc:   Pricilla Holm, M.D. Rosalin Hawking, MD

## 2016-05-13 NOTE — Progress Notes (Signed)
Speech Language Pathology Discharge Summary  Patient Details  Name: Sarah [name] MRN: [id number] Date of Birth: [birth year]  Patient has met 2 of 3 long term goals.  Patient to discharge at overall Supervision level.   Reasons goals not met: Patient requires supervision verbal cues for use of swallowing compensatory strategies.    Clinical Impression/Discharge Summary: Patient has made functional gains and has met 2 of 3 LTG's this admission. Currently, patient is consuming Dys. 2 textures with thin liquids with minimal overt s/s of aspiration and overall supervision verbal cues needed for use of swallowing compensatory strategies. Patient is currently supervision-Mod I for word-finding and overall speech intelligibility at the conversation level and requires supervision verbal cues for recall of new information with use of compensatory strategies. Patient and caregiver education is complete and patient will discharge home with 24 hour supervision. Patient would benefit from f/u SLP services to maximize her cognitive and swallowing function and overall functional independence in order to reduce caregiver burden.   Care Partner:  Caregiver Able to Provide Assistance: Yes  Type of Caregiver Assistance: Physical;Cognitive  Recommendation:  Home Health SLP;24 hour supervision/assistance  Rationale for SLP Follow Up: Maximize cognitive function and independence;Maximize swallowing safety   Equipment: N/A   Reasons for discharge: Discharged from hospital;Treatment goals met   Patient/Family Agrees with Progress Made and Goals Achieved: Yes     [name], Stephen 05/13/2016, 6:59 AM

## 2016-05-13 NOTE — Progress Notes (Signed)
Subjective/Complaints: Pt seen laying in bed this AM.  She states she is doing well and that she is ready to go home. Yesterday pt reported dysuria to nursing as well.   ROS: +Cough, headache, nausea. Denies CP, SOB, V/D.  Objective: Vital Signs: Blood pressure (!) 145/57, pulse 69, temperature 98.4 F (36.9 C), temperature source Oral, resp. rate 20, height 5' (1.524 m), weight 74.4 kg (164 lb), SpO2 98 %. No results found. Results for orders placed or performed during the hospital encounter of 04/30/16 (from the past 72 hour(s))  Basic metabolic panel     Status: Abnormal   Collection Time: 05/10/16 11:48 AM  Result Value Ref Range   Sodium 133 (L) 135 - 145 mmol/L   Potassium 3.9 3.5 - 5.1 mmol/L   Chloride 103 101 - 111 mmol/L   CO2 21 (L) 22 - 32 mmol/L   Glucose, Bld 95 65 - 99 mg/dL   BUN 13 6 - 20 mg/dL   Creatinine, Ser 9.29 0.44 - 1.00 mg/dL   Calcium 8.8 (L) 8.9 - 10.3 mg/dL   GFR calc non Af Amer >60 >60 mL/min   GFR calc Af Amer >60 >60 mL/min    Comment: (NOTE) The eGFR has been calculated using the CKD EPI equation. This calculation has not been validated in all clinical situations. eGFR's persistently <60 mL/min signify possible Chronic Kidney Disease.    Anion gap 9 5 - 15  CBC with Differential/Platelet     Status: Abnormal   Collection Time: 05/10/16 11:48 AM  Result Value Ref Range   WBC 5.6 4.0 - 10.5 K/uL   RBC 3.77 (L) 3.87 - 5.11 MIL/uL   Hemoglobin 11.2 (L) 12.0 - 15.0 g/dL   HCT 94.2 (L) 56.5 - 73.0 %   MCV 92.6 78.0 - 100.0 fL   MCH 29.7 26.0 - 34.0 pg   MCHC 32.1 30.0 - 36.0 g/dL   RDW 84.3 51.6 - 27.8 %   Platelets 356 150 - 400 K/uL   Neutrophils Relative % 63 %   Neutro Abs 3.6 1.7 - 7.7 K/uL   Lymphocytes Relative 15 %   Lymphs Abs 0.8 0.7 - 4.0 K/uL   Monocytes Relative 12 %   Monocytes Absolute 0.7 0.1 - 1.0 K/uL   Eosinophils Relative 10 %   Eosinophils Absolute 0.5 0.0 - 0.7 K/uL   Basophils Relative 0 %   Basophils Absolute 0.0  0.0 - 0.1 K/uL  Urinalysis, Routine w reflex microscopic     Status: Abnormal   Collection Time: 05/12/16  7:08 PM  Result Value Ref Range   Color, Urine YELLOW YELLOW   APPearance CLEAR CLEAR   Specific Gravity, Urine 1.008 1.005 - 1.030   pH 5.0 5.0 - 8.0   Glucose, UA NEGATIVE NEGATIVE mg/dL   Hgb urine dipstick NEGATIVE NEGATIVE   Bilirubin Urine NEGATIVE NEGATIVE   Ketones, ur 5 (A) NEGATIVE mg/dL   Protein, ur NEGATIVE NEGATIVE mg/dL   Nitrite NEGATIVE NEGATIVE   Leukocytes, UA NEGATIVE NEGATIVE    General: NAD. Vital signs reviewed.  Heart: RRR. No JVD. Lungs: Clear to auscultation, breathing unlabored. Abdomen: Positive bowel sounds, soft Musculoskeletal: No edema. No tenderness Neurologic:  Sensation: Hyperalgesia distal b/l knees Motor: 4+/5 in bilateral deltoid, bicep, tricep, grip, right hip flexor, knee extensors, ankle dorsiflexor and plantar flexor 4+/5 in LLE, hip flexor, knee extensor, ankle dorsiflexor and plantar flexor with some apraxia (unchanged) B/l LE slightly limited below knees due to neuropathy Skin: No  evidence of breakdown, Intact Psych: Anxious.  Assessment/Plan: 1. Functional deficits secondary to Left frontal, left corona radiata infarct which require 3+ hours per day of interdisciplinary therapy in a comprehensive inpatient rehab setting. Physiatrist is providing close team supervision and 24 hour management of active medical problems listed below. Physiatrist and rehab team continue to assess barriers to discharge/monitor patient progress toward functional and medical goals. FIM: Function - Bathing Bathing activity did not occur: Refused Position: Sitting EOB Body parts bathed by patient: Right arm, Left arm, Chest, Abdomen, Front perineal area, Buttocks, Right upper leg, Left upper leg Body parts bathed by helper: Right lower leg, Left lower leg, Back Bathing not applicable: Back Assist Level: Touching or steadying assistance(Pt > 75%) (per  staff report) Set up : To obtain items  Function- Upper Body Dressing/Undressing Upper body dressing/undressing activity did not occur: Refused What is the patient wearing?: Pull over shirt/dress (per staff report) Bra - Perfomed by patient: Thread/unthread right bra strap, Thread/unthread left bra strap Bra - Perfomed by helper: Hook/unhook bra (pull down sports bra) Pull over shirt/dress - Perfomed by patient: Thread/unthread right sleeve, Thread/unthread left sleeve, Put head through opening, Pull shirt over trunk Assist Level: Set up, Supervision or verbal cues Set up : To obtain clothing/put away Function - Lower Body Dressing/Undressing Lower body dressing/undressing activity did not occur: Refused What is the patient wearing?: Underwear Position: Other (comment) (toilet) Underwear - Performed by patient: Thread/unthread right underwear leg, Thread/unthread left underwear leg, Pull underwear up/down Underwear - Performed by helper: Thread/unthread right underwear leg Pants- Performed by patient: Pull pants up/down Pants- Performed by helper: Thread/unthread right pants leg, Thread/unthread left pants leg Non-skid slipper socks- Performed by helper: Don/doff right sock, Don/doff left sock Assist for footwear: Supervision/touching assist Assist for lower body dressing: Touching or steadying assistance (Pt > 75%) Set up : To obtain clothing/put away  Function - Toileting Toileting activity did not occur: No continent bowel/bladder event Toileting steps completed by patient: Performs perineal hygiene, Adjust clothing after toileting, Adjust clothing prior to toileting Toileting steps completed by helper: Adjust clothing prior to toileting [place]: Grab bar or rail Assist level: Supervision or verbal cues  Function - Toilet Transfers Toilet transfer activity did not occur: N/A Toilet transfer assistive device: Walker, Grab bar Assist level to toilet: Supervision  or verbal cues Assist level from toilet: Supervision or verbal cues Assist level to bedside commode (at bedside): Touching or steadying assistance (Pt > 75%) Assist level from bedside commode (at bedside): Touching or steadying assistance (Pt > 75%)  Function - Chair/bed transfer Chair/bed transfer activity did not occur: N/A Chair/bed transfer method: Ambulatory Chair/bed transfer assist level: Supervision or verbal cues Chair/bed transfer assistive device: Armrests, Walker Chair/bed transfer details: Verbal cues for technique, Verbal cues for precautions/safety  Function - Locomotion: Wheelchair Will patient use wheelchair at discharge?: No Type: Manual Max wheelchair distance: 75 Assist Level: Touching or steadying assistance (Pt > 75%) Assist Level: Touching or steadying assistance (Pt > 75%) Function - Locomotion: Ambulation Assistive device: Other (comment) (Rollator) Max distance: 50 Assist level: Supervision or verbal cues Assist level: Supervision or verbal cues Assist level: Supervision or verbal cues Walk 150 feet activity did not occur: Safety/medical concerns (per report) Assist level: Supervision or verbal cues Walk 10 feet on uneven surfaces activity did not occur: Safety/medical concerns Assist level: Touching or steadying assistance (Pt > 75%)  Function - Comprehension Comprehension: Auditory Comprehension assist level: Understands basic 90% of the time/cues <  10% of the time  Function - Expression Expression: Verbal Expression assist level: Expresses basic 90% of the time/requires cueing < 10% of the time.  Function - Social Interaction Social Interaction assist level: Interacts appropriately 90% of the time - Needs monitoring or encouragement for participation or interaction.  Function - Problem Solving Problem solving assist level: Solves basic 75 - 89% of the time/requires cueing 10 - 24% of the time  Function - Memory Memory assist level: Recognizes  or recalls 75 - 89% of the time/requires cueing 10 - 24% of the time Patient normally able to recall (first 3 days only): Current season, Location of own room, Staff names and faces, That he or she is in a hospital    [place] 05/13/2016, 8:36 AM    Medical Problem List and Plan: 1. Gait disorder/apraxia with dysphagia/dysarthriasecondary to left frontal/corona radiata infarct secondary to small vessel disease as well as history of CVA in the past with left side mild residual weakness  D/c today - pt complains are improved from baseline, with little objective findings of abnormalities after workup/eval  Will see pt for transitional care management in 1-2 weeks. 2. Anticoagulation: Subcutaneous Lovenox. Monitor platelet counts and any signs of bleeding 3. Pain Management/hx of chronic low back pain  Cont topiramate started 2/4 for headaches- believe this has improved 4. Mood: Xanax 0.5 mg twice a day, Pamelor 25 mg daily at bedtime, Trilafon 2 mg daily at bedtime, Halcion 0.125 mg bedtime as needed, monitor for oversedation with the addition of topiramate 5. Neuropsych: This patient iscapable of making decisions on herown behalf. 6. Skin/Wound Care: Routine skin checks 7. Fluids/Electrolytes/Nutrition: Routine I&Os 8.Dysphagia. Dysphagia #1 thin liquids, advanced to D2 thin. Monitor hydration. Follow-up speech therapy Has hx of esophageal stricture  Cont protonix 9.Parkinson's disease. Sinemet 25-1003 times a day -monitor PD symptoms with therapy/activity and address as indicated.  10.Hyperlipidemia. Pravachol 11. Hypoalbuminemia  Supplement initiated 2/5 12. Nausea  Believe this has improved  Zofran increased to 4m, increased to q4 PRN 2/6  Will cont to monitor 13. Constipation  Increased reg again on 2/6  Improving  14. Acute lower UTI  UA+, Ucx E. Coli  Completed Macrobid 2/8-2/14  Repeat Ua neg, Ucx pending due to complaints of dysuria  on 2/14 15. Mild hyponatremia  Na 133 on 2/12  Cont to monitor 16. ABLA  Hb 11.2 on 2/12  Cont to monitor 17. Cough  Tessalon pearls added 2/13  Believe this is improving 18. Ear wax  Debrox ordered 2/14  LOS (Days): 13 A FACE TO FACE EVALUATION WAS PERFORMED

## 2016-05-13 NOTE — Discharge Instructions (Signed)
Inpatient Rehab Discharge Instructions  Sarah [name] Discharge date and time: No discharge date for patient encounter.   Activities/Precautions/ Functional Status: Activity: activity as tolerated Diet: Dysphagia #1 thin liquids Wound Care: none needed Functional status:  ___ No restrictions     ___ Walk up steps independently ___ 24/7 supervision/assistance   ___ Walk up steps with assistance ___ Intermittent supervision/assistance  ___ Bathe/dress independently ___ Walk with walker     _x__ Bathe/dress with assistance ___ Walk Independently    ___ Shower independently ___ Walk with assistance    ___ Shower with assistance ___ No alcohol     ___ Return to work/school ________   COMMUNITY REFERRALS UPON DISCHARGE:    Home Health:   PT     OT     ST                     Agency:  [facility]      Phone:  [phone]  DME:  3n1 commode via [employer] [employer] @ [phone]   GENERAL COMMUNITY RESOURCES FOR PATIENT/FAMILY:  Support Groups:  Stroke Support Group                                Meets the 2nd Thursday of each month at 3:00 pm                              @ Inpatient Rehab Unit at [facility] (dayroom)                                       Contact:  [place] [employer] @ [phone]       Special Instructions: Plan aspirin and Plavix 3 months then Plavix alone STROKE/TIA DISCHARGE INSTRUCTIONS SMOKING Cigarette smoking nearly doubles your risk of having a stroke & is the single most alterable risk factor  If you smoke or have smoked in the last 12 months, you are advised to quit smoking for your health.  Most of the excess cardiovascular risk related to smoking disappears within a year of stopping.  Ask you doctor about anti-smoking medications  [place] Quit Line: 1-800-QUIT NOW  Free Smoking Cessation Classes [phone]  CHOLESTEROL Know your levels; limit fat & cholesterol in your diet  Lipid Panel     Component Value Date/Time   CHOL 139  04/28/2016 0725   TRIG 61 04/28/2016 0725   HDL 70 04/28/2016 0725   CHOLHDL 2.0 04/28/2016 0725   VLDL 12 04/28/2016 0725   LDLCALC 57 04/28/2016 0725      Many patients benefit from treatment even if their cholesterol is at goal.  Goal: Total Cholesterol (CHOL) less than 160  Goal:  Triglycerides (TRIG) less than 150  Goal:  HDL greater than 40  Goal:  LDL (LDLCALC) less than 100   BLOOD PRESSURE American Stroke Association blood pressure target is less that 120/80 mm/Hg  Your discharge blood pressure is:  BP: (!) 130/55  Monitor your blood pressure  Limit your salt and alcohol intake  Many individuals will require more than one medication for high blood pressure  DIABETES (A1c is a blood sugar average for last 3 months) Goal HGBA1c is under 7% (HBGA1c is blood sugar average for last 3 months)  Diabetes: No known  diagnosis of diabetes    Lab Results  Component Value Date   HGBA1C 5.7 (H) 04/28/2016     Your HGBA1c can be lowered with medications, healthy diet, and exercise.  Check your blood sugar as directed by your physician  Call your physician if you experience unexplained or low blood sugars.  PHYSICAL ACTIVITY/REHABILITATION Goal is 30 minutes at least 4 days per week  Activity: Increase activity slowly, Therapies: Physical Therapy: Home Health Return to work:   Activity decreases your risk of heart attack and stroke and makes your heart stronger.  It helps control your weight and blood pressure; helps you relax and can improve your mood.  Participate in a regular exercise program.  Talk with your doctor about the best form of exercise for you (dancing, walking, swimming, cycling).  DIET/WEIGHT Goal is to maintain a healthy weight  Your discharge diet is: DIET - DYS 1 Room service appropriate? Yes; Fluid consistency: Thin  liquids Your height is:  Height: 5' (152.4 cm) Your current weight is: Weight: 73.8 kg (162 lb 11.2 oz) Your Body Mass Index (BMI) is:   BMI (Calculated): 32  Following the type of diet specifically designed for you will help prevent another stroke.  Your goal weight range is:    Your goal Body Mass Index (BMI) is 19-24.  Healthy food habits can help reduce 3 risk factors for stroke:  High cholesterol, hypertension, and excess weight.  RESOURCES Stroke/Support Group:  Call [phone]   STROKE EDUCATION PROVIDED/REVIEWED AND GIVEN TO PATIENT Stroke warning signs and symptoms How to activate emergency medical system (call 911). Medications prescribed at discharge. Need for follow-up after discharge. Personal risk factors for stroke. Pneumonia vaccine given:  Flu vaccine given:  My questions have been answered, the writing is legible, and I understand these instructions.  I will adhere to these goals & educational materials that have been provided to me after my discharge from the hospital.      My questions have been answered and I understand these instructions. I will adhere to these goals and the provided educational materials after my discharge from the hospital.  Patient/Caregiver Signature _______________________________ Date __________  Clinician Signature _______________________________________ Date __________  Please bring this form and your medication list with you to all your follow-up doctor's appointments.

## 2016-05-14 ENCOUNTER — Telehealth: Payer: Self-pay | Admitting: Neurology

## 2016-05-14 ENCOUNTER — Telehealth: Payer: Self-pay | Admitting: *Deleted

## 2016-05-14 ENCOUNTER — Telehealth: Payer: Self-pay

## 2016-05-14 DIAGNOSIS — I69322 Dysarthria following cerebral infarction: Secondary | ICD-10-CM | POA: Diagnosis not present

## 2016-05-14 DIAGNOSIS — R1312 Dysphagia, oropharyngeal phase: Secondary | ICD-10-CM | POA: Diagnosis not present

## 2016-05-14 DIAGNOSIS — G20 Parkinson's disease: Secondary | ICD-10-CM | POA: Diagnosis not present

## 2016-05-14 DIAGNOSIS — I69991 Dysphagia following unspecified cerebrovascular disease: Secondary | ICD-10-CM | POA: Diagnosis not present

## 2016-05-14 DIAGNOSIS — I10 Essential (primary) hypertension: Secondary | ICD-10-CM | POA: Diagnosis not present

## 2016-05-14 DIAGNOSIS — I69354 Hemiplegia and hemiparesis following cerebral infarction affecting left non-dominant side: Secondary | ICD-10-CM | POA: Diagnosis not present

## 2016-05-14 LAB — URINE CULTURE

## 2016-05-14 NOTE — Telephone Encounter (Signed)
Transition Care Management Follow-up Telephone Call   Date discharged? 05/13/16   How have you been since you were released from the hospital? Called pt she states she is doing alright   Do you understand why you were in the hospital? YES   Do you understand the discharge instructions? YES   Where were you discharged to? Pt state she was D/C from hospital 04/30/16 and sent to rehab.  Stayed in rehab for a week or so came home yesterday Thursday 05/13/16   Items Reviewed:  Medications reviewed: YES  Allergies reviewed: YES  Dietary changes reviewed: YES  Referrals reviewed: YES, has f/u appt w/Dr. Posey Pronto   Functional Questionnaire:   Activities of Daily Living (ADLs):   She states she are independent in the following: bathing and hygiene, feeding, continence, grooming, toileting and dressing States she require assistance with the following: ambulation, bathing and hygiene and dressing   Any transportation issues/concerns?: NO   Any patient concerns? NO   Confirmed importance and date/time of follow-up visits scheduled YES, appt 05/24/16  Provider Appointment booked with Dr. Sharlet Salina  Confirmed with patient if condition begins to worsen call PCP or go to the ER.  Patient was given the office number and encouraged to call back with question or concerns.  : YES

## 2016-05-14 NOTE — Telephone Encounter (Signed)
Tuesday 3:30p on 2/27.    Donika K. Posey Pronto, DO

## 2016-05-14 NOTE — Telephone Encounter (Signed)
Patient daughter called and states that the patient is getting discharged and wants to follow up with dr patel and not the one that seen her while she was in patient but hey want her seen in a week and we dont have anything please advise thank you [phone]

## 2016-05-14 NOTE — Telephone Encounter (Signed)
Transitional Care call  Patient name: Sarah [name] DOB: Jul 01, 1926 1. Are you/is patient experiencing any problems since coming home? Yes, discharge process took its toll, somewhat depressed, not used to strangers in home a. Are there any questions regarding any aspect of care? No  2. Are there any questions regarding medications administration/dosing? No  a. Are meds being taken as prescribed? Yes  b. "Patient should review meds with caller to confirm"  3. Have there been any falls? No  4. Has Home Health been to the house and/or have they contacted you? Yes  a. If not, have you tried to contact them? No  b. Can we help you contact them? No  5. Are bowels and bladder emptying properly? No some constipation  a. Are there any unexpected incontinence issues? No  b. If applicable, is patient following bowel/bladder programs? NA 6. Any fevers, problems with breathing, unexpected pain? No  7. Are there any skin problems or new areas of breakdown? No  8. Has the patient/family member arranged specialty MD follow up (ie cardiology/neurology/renal/surgical/etc.)? Yes  a. Can we help arrange? No  9. Does the patient need any other services or support that we can help arrange? No  10. Are caregivers following through as expected in assisting the patient? Yes  11. Has the patient quit smoking, drinking alcohol, or using drugs as recommended? NA  Appointment time 05-21-2016 1100am, arrive time 1040am and who it is with here Dr. Posey Pronto  [phone]

## 2016-05-14 NOTE — Telephone Encounter (Signed)
Please advise 

## 2016-05-15 NOTE — Plan of Care (Signed)
Problem: RH BOWEL ELIMINATION Goal: RH STG MANAGE BOWEL WITH ASSISTANCE STG Manage Bowel with min Assistance.  Outcome: Not Met (add Reason) Patient required medications for BM. Patient refused medications to have a BM 05-11-13 and day of discharge 05-12-13. Goal: RH STG MANAGE BOWEL W/MEDICATION W/ASSISTANCE STG Manage Bowel with Medication with min Assistance.  Outcome: Not Met (add Reason) Patient requires mod assist with medications.

## 2016-05-15 NOTE — Progress Notes (Signed)
Late entry. Patient discharged to home with daughters. Patient and daughters verbalized understanding of discharge instructions given and reviewed by D. Tolstoy, PA. Patient and daughter given education on constipation. Patient LBM 05-11-13 per patient. Patient refused additional LOC due to discharging home.  Mliss Sax

## 2016-05-17 DIAGNOSIS — I69991 Dysphagia following unspecified cerebrovascular disease: Secondary | ICD-10-CM | POA: Diagnosis not present

## 2016-05-17 DIAGNOSIS — I69322 Dysarthria following cerebral infarction: Secondary | ICD-10-CM | POA: Diagnosis not present

## 2016-05-17 DIAGNOSIS — R1312 Dysphagia, oropharyngeal phase: Secondary | ICD-10-CM | POA: Diagnosis not present

## 2016-05-17 DIAGNOSIS — G20 Parkinson's disease: Secondary | ICD-10-CM | POA: Diagnosis not present

## 2016-05-17 DIAGNOSIS — I69354 Hemiplegia and hemiparesis following cerebral infarction affecting left non-dominant side: Secondary | ICD-10-CM | POA: Diagnosis not present

## 2016-05-17 DIAGNOSIS — I10 Essential (primary) hypertension: Secondary | ICD-10-CM | POA: Diagnosis not present

## 2016-05-17 NOTE — Telephone Encounter (Signed)
Notified [name] of patient's upcoming appointment.

## 2016-05-18 DIAGNOSIS — R1312 Dysphagia, oropharyngeal phase: Secondary | ICD-10-CM | POA: Diagnosis not present

## 2016-05-18 DIAGNOSIS — I69354 Hemiplegia and hemiparesis following cerebral infarction affecting left non-dominant side: Secondary | ICD-10-CM | POA: Diagnosis not present

## 2016-05-18 DIAGNOSIS — G20 Parkinson's disease: Secondary | ICD-10-CM | POA: Diagnosis not present

## 2016-05-18 DIAGNOSIS — I10 Essential (primary) hypertension: Secondary | ICD-10-CM | POA: Diagnosis not present

## 2016-05-18 DIAGNOSIS — I69991 Dysphagia following unspecified cerebrovascular disease: Secondary | ICD-10-CM | POA: Diagnosis not present

## 2016-05-18 DIAGNOSIS — I69322 Dysarthria following cerebral infarction: Secondary | ICD-10-CM | POA: Diagnosis not present

## 2016-05-19 ENCOUNTER — Inpatient Hospital Stay: Payer: Medicare Other | Admitting: Internal Medicine

## 2016-05-21 ENCOUNTER — Encounter: Payer: Medicare Other | Admitting: Physical Medicine & Rehabilitation

## 2016-05-21 DIAGNOSIS — G20 Parkinson's disease: Secondary | ICD-10-CM | POA: Diagnosis not present

## 2016-05-21 DIAGNOSIS — I10 Essential (primary) hypertension: Secondary | ICD-10-CM | POA: Diagnosis not present

## 2016-05-21 DIAGNOSIS — R1312 Dysphagia, oropharyngeal phase: Secondary | ICD-10-CM | POA: Diagnosis not present

## 2016-05-21 DIAGNOSIS — I69322 Dysarthria following cerebral infarction: Secondary | ICD-10-CM | POA: Diagnosis not present

## 2016-05-21 DIAGNOSIS — I69991 Dysphagia following unspecified cerebrovascular disease: Secondary | ICD-10-CM | POA: Diagnosis not present

## 2016-05-21 DIAGNOSIS — I69354 Hemiplegia and hemiparesis following cerebral infarction affecting left non-dominant side: Secondary | ICD-10-CM | POA: Diagnosis not present

## 2016-05-24 ENCOUNTER — Ambulatory Visit (INDEPENDENT_AMBULATORY_CARE_PROVIDER_SITE_OTHER): Payer: Medicare Other | Admitting: Internal Medicine

## 2016-05-24 ENCOUNTER — Encounter: Payer: Self-pay | Admitting: Internal Medicine

## 2016-05-24 VITALS — BP 130/70 | HR 94 | Temp 97.5°F | Ht 60.0 in | Wt 154.0 lb

## 2016-05-24 DIAGNOSIS — I69328 Other speech and language deficits following cerebral infarction: Secondary | ICD-10-CM | POA: Diagnosis not present

## 2016-05-24 DIAGNOSIS — I69392 Facial weakness following cerebral infarction: Secondary | ICD-10-CM | POA: Diagnosis not present

## 2016-05-24 DIAGNOSIS — I63511 Cerebral infarction due to unspecified occlusion or stenosis of right middle cerebral artery: Secondary | ICD-10-CM

## 2016-05-24 DIAGNOSIS — R269 Unspecified abnormalities of gait and mobility: Secondary | ICD-10-CM | POA: Diagnosis not present

## 2016-05-24 DIAGNOSIS — E611 Iron deficiency: Secondary | ICD-10-CM | POA: Diagnosis not present

## 2016-05-24 DIAGNOSIS — E538 Deficiency of other specified B group vitamins: Secondary | ICD-10-CM

## 2016-05-24 MED ORDER — TRIAZOLAM 0.25 MG PO TABS: 0.1250 mg | ORAL_TABLET | Freq: Every evening | ORAL | 2 refills | Status: DC | PRN

## 2016-05-24 MED ORDER — CYANOCOBALAMIN 1000 MCG/ML IJ SOLN
1000.0000 ug | Freq: Once | INTRAMUSCULAR | Status: AC
  Administered 2016-05-24: 1000 ug via INTRAMUSCULAR

## 2016-05-24 MED ORDER — ALPRAZOLAM 0.5 MG PO TABS: 0.5000 mg | ORAL_TABLET | Freq: Two times a day (BID) | ORAL | 1 refills | Status: DC

## 2016-05-24 NOTE — Assessment & Plan Note (Signed)
Given B12 shot at the visit.

## 2016-05-24 NOTE — Progress Notes (Signed)
Pre visit review using our clinic review tool, if applicable. No additional management support is needed unless otherwise documented below in the visit note. 

## 2016-05-24 NOTE — Assessment & Plan Note (Signed)
Still working with PT/OT and speech therapy at home post stroke but her weakness pre-dated the stroke.

## 2016-05-24 NOTE — Patient Instructions (Addendum)
It is okay to take the tylenol for the headaches.   Call us if the nausea is worsening.   Keep working with physical therapy and on your own to build the strength.

## 2016-05-24 NOTE — Progress Notes (Signed)
   Subjective:    Patient ID: Sarah [name], female    DOB: 1926-07-04, 83 y.o.   MRN: [id number]  HPI The patient is an 83 YO female coming in for hospital follow up (in the hospital with new stroke and speech changes and weakness, did rehab at the hospital and they have helped with strength some). She is having some nausea and headaches since the stroke and they are daily. Denies vomiting and is still eating okay. Her nausea is improving since her home health person found some old nausea medicine in her cabinet and she took it. She is drinking well. She is still having some sensation changes in the left leg and weakness more in the left than right legs. Denies facial numbness or speech changes. Back to normal diet (on pureed food in the hospital part of the time).  PMH, [facility], social history reviewed and updated.   Review of Systems  Constitutional: Positive for activity change, appetite change and fatigue. Negative for chills, fever and unexpected weight change.  HENT: Negative.   Eyes: Negative.   Respiratory: Negative.   Cardiovascular: Negative.   Gastrointestinal: Negative for abdominal distention, abdominal pain, constipation, diarrhea and nausea.  Musculoskeletal: Positive for gait problem and myalgias.  Skin: Negative.   Neurological: Positive for facial asymmetry and weakness. Negative for dizziness, seizures, syncope, speech difficulty and numbness.       Chronic changes stable, some new sensation change to the left side since recent stroke  Psychiatric/Behavioral: Negative.       Objective:   Physical Exam  Constitutional: She is oriented to person, place, and time. She appears well-developed and well-nourished.  HENT:  Head: Normocephalic and atraumatic.  Eyes: EOM are normal.  Neck: Normal range of motion. No JVD present.  Cardiovascular: Normal rate and regular rhythm.   Pulmonary/Chest: Effort normal and breath sounds normal. No respiratory distress. She has no wheezes.  She has no rales.  Abdominal: Soft. Bowel sounds are normal. She exhibits no distension. There is no tenderness. There is no rebound and no guarding.  Musculoskeletal: She exhibits no edema.  Neurological: She is alert and oriented to person, place, and time. A cranial nerve deficit is present. Coordination abnormal.  Decrease in sensation left leg and foot, right normal. Upper extremity normal. Weakness equal right and left upper extremity (3/5 at best), left leg more weak than the right (2-3/5 left versus 3/5 right).   Skin: Skin is warm and dry.  Psychiatric: She has a normal mood and affect.   Vitals:   05/24/16 1356  BP: 130/70  Pulse: 94  Temp: 97.5 F (36.4 C)  TempSrc: Oral  SpO2: 97%  Weight: 154 lb (69.9 kg)  Height: 5' (1.524 m)      Assessment & Plan:  B12 shot given at visit.

## 2016-05-24 NOTE — Assessment & Plan Note (Signed)
Is at home now with stroke, doing home pt/ot/speech. She is able to do some ADLs. She is having some headaches and we talked about how tylenol is the safest option for this. Eating and drinking well and no swallowing difficulties.

## 2016-05-25 ENCOUNTER — Ambulatory Visit (INDEPENDENT_AMBULATORY_CARE_PROVIDER_SITE_OTHER): Payer: Medicare Other | Admitting: Neurology

## 2016-05-25 ENCOUNTER — Encounter: Payer: Self-pay | Admitting: Neurology

## 2016-05-25 VITALS — BP 120/70 | HR 88 | Ht 60.0 in | Wt 160.5 lb

## 2016-05-25 DIAGNOSIS — I69354 Hemiplegia and hemiparesis following cerebral infarction affecting left non-dominant side: Secondary | ICD-10-CM | POA: Diagnosis not present

## 2016-05-25 DIAGNOSIS — R1312 Dysphagia, oropharyngeal phase: Secondary | ICD-10-CM | POA: Diagnosis not present

## 2016-05-25 DIAGNOSIS — G20 Parkinson's disease: Secondary | ICD-10-CM | POA: Diagnosis not present

## 2016-05-25 DIAGNOSIS — R51 Headache: Secondary | ICD-10-CM | POA: Diagnosis not present

## 2016-05-25 DIAGNOSIS — I63511 Cerebral infarction due to unspecified occlusion or stenosis of right middle cerebral artery: Secondary | ICD-10-CM

## 2016-05-25 DIAGNOSIS — I69322 Dysarthria following cerebral infarction: Secondary | ICD-10-CM | POA: Diagnosis not present

## 2016-05-25 DIAGNOSIS — I639 Cerebral infarction, unspecified: Secondary | ICD-10-CM

## 2016-05-25 DIAGNOSIS — G4486 Cervicogenic headache: Secondary | ICD-10-CM

## 2016-05-25 DIAGNOSIS — I10 Essential (primary) hypertension: Secondary | ICD-10-CM | POA: Diagnosis not present

## 2016-05-25 DIAGNOSIS — G609 Hereditary and idiopathic neuropathy, unspecified: Secondary | ICD-10-CM | POA: Diagnosis not present

## 2016-05-25 DIAGNOSIS — I69991 Dysphagia following unspecified cerebrovascular disease: Secondary | ICD-10-CM | POA: Diagnosis not present

## 2016-05-25 MED ORDER — CARBIDOPA-LEVODOPA 25-100 MG PO TABS: 1.0000 | ORAL_TABLET | Freq: Three times a day (TID) | ORAL | 3 refills | Status: DC

## 2016-05-25 MED ORDER — NORTRIPTYLINE HCL 50 MG PO CAPS: 50.0000 mg | ORAL_CAPSULE | Freq: Every day | ORAL | 11 refills | Status: DC

## 2016-05-25 NOTE — Patient Instructions (Addendum)
1.  Increase nortriptyline to 50mg  at bedtime for headaches and leg pain 2.  Continue plavix and aspirin  3.  You can try magnesium oxide 400-600mg  daily for headaches 4.  Continue speech and physical therapy  Return to clinic in 3 months

## 2016-05-25 NOTE — Progress Notes (Signed)
[facility]  Follow-up Visit   Date: 05/25/16   Sarah [name] MRN: [id number] DOB: [birth year]   Interim History: Sarah [name] is a 83 y.o. right-handed Caucasian female with history of HTN, HPL, depression, esophageal strictures, and GERD returning to the clinic for follow-up of right facial droop and dysarthria due to right corona radiata stroke.  She has previously been seen here for Parkinson's disease and gait instability.  The patient was accompanied to the clinic by her caregiver, Sarah [name].  History of present illness: Leg weakness started slowly around May 2014 when she noticed difficulty with walking and tripping on things. She would stumble frequently, no falls.  She has associated bilateral achy knee pain and chronic back pain.  Forward flexion makes her pain worse and it is improved by laying down. EMG was performed and shows moderate peripheral neuropathy of the legs and L3-L4 old radiculopathy.   Caregiver says that she is more forgetful than usual, especially with conversation.    In April 2016, she was hospitalization for acute stroke manifested with slurred speech which has since resolved.  She was started on Plavix 75 mg for this.  During the fall of 2016, she started experiencing bifrontal throbbing headaches. She was give tizanidine, but did not tolerate this.  Neck PT helped. Her headaches are much improved since she started taking ginger supplements.   In June 2017 she was diagnosed with parkinsonism she noticed that her movements and become slower and she is able to do less work. She notices a marked difference in her walking when she is taking sinemet 1 tab TID.  UPDATE 05/25/2016:  Patient developed acute onset of dysarthria, right facial droop while at her PCPs office and was transferred to the emergency department for evaluation. She was found to have a small left frontal corona radiata stroke secondary to small vessel disease. IV TPA was  not administered due to mild symptoms. Echo was normal, no thrombus.  US carotids showed no significant stenosis.  She was taking plavix 75mg  and with her new event, aspirin was added.  DAPT for 3 months recommended, then plavix alone.  She was transferred to skilled nursing facility from 2/2-2/15/2018 and then discharged home where she continues to get home PT and speech therapy. She declined home occupational therapy last week due to being overwhelmed with people in her home. She feels that her speech and facial droop are slowly improving. She denies any difficulty with swallowing and is mostly having soft foods. She denies any new weakness.  Her secondary major complaint is new onset of bifrontal throbbing headaches. She was given a prescription for topiramate in the hospital, but did not take this. She also complains of cold painful feet, especially at bedtime.  Medications:  Current Outpatient Prescriptions on File Prior to Visit  Medication Sig Dispense Refill  . acetaminophen (TYLENOL) 500 MG tablet Take 1 tablet (500 mg total) by mouth every 8 (eight) hours as needed (for pain). 180 tablet 2  . ALPRAZolam (XANAX) 0.5 MG tablet Take 1 tablet (0.5 mg total) by mouth 2 (two) times daily. 60 tablet 1  . aspirin 325 MG EC tablet Take 1 tablet (325 mg total) by mouth daily. 30 tablet 0  . Cholecalciferol (VITAMIN D PO) Take 1 tablet by mouth daily.     . clopidogrel (PLAVIX) 75 MG tablet Take 1 tablet (75 mg total) by mouth daily. 90 tablet 2  . diclofenac sodium (VOLTAREN) 1 % GEL Apply  To feet 3 times daily prn for pain 1 Tube 5  . nitroGLYCERIN (NITROSTAT) 0.4 MG SL tablet Place 1 tablet (0.4 mg total) under the tongue every 5 (five) minutes as needed for chest pain. 50 tablet prn  . pantoprazole (PROTONIX) 40 MG tablet Take 1 tablet (40 mg total) by mouth daily. 30 tablet 11  . perphenazine (TRILAFON) 2 MG tablet Take 1 tablet (2 mg total) by mouth at bedtime. 30 tablet 2  . polyethylene glycol  (MIRALAX / GLYCOLAX) packet Take 17 g by mouth daily. 14 each 0  . pravastatin (PRAVACHOL) 20 MG tablet TAKE (1/2) TABLET DAILY. 45 tablet 3  . prednisoLONE acetate (PRED FORTE) 1 % ophthalmic suspension Place 1 drop into the right eye 2 (two) times daily. 5 mL 0  . solifenacin (VESICARE) 10 MG tablet TAKE (1) TABLET DAILY AS NEEDED FOR BLADDER 30 tablet 0  . triazolam (HALCION) 0.25 MG tablet Take 0.5 tablets (0.125 mg total) by mouth at bedtime as needed for sleep. 15 tablet 2   No current facility-administered medications on file prior to visit.     Allergies:  Allergies  Allergen Reactions  . Lipitor [Atorvastatin Calcium] Other (See Comments)    Leg weakness,memory loss  . Lovastatin Other (See Comments)    Leg weakness  . Celebrex [Celecoxib] Itching and Rash  . Latex Rash  . Macrobid [employer] Macro] Other (See Comments)    Unknown per patient  . Other Other (See Comments)    Always pads     Review of Systems:  CONSTITUTIONAL: No fevers, chills, night sweats, or weight loss.   EYES: No visual changes or eye pain ENT: No hearing changes.  No history of nose bleeds. RESPIRATORY: No cough, wheezing and shortness of breath.   CARDIOVASCULAR: Negative for chest pain, and palpitations.   GI: Negative for abdominal discomfort, blood in stools or black stools.  No recent change in bowel habits.  GU:  No history of incontinence.   MUSCLOSKELETAL: +history of joint pain or swelling.  +myalgias.  + back pain SKIN: Negative for lesions, rash, and itching.   HEMATOLOGY/ONCOLOGY: Negative for prolonged bleeding, bruising easily, and swollen nodes.   ENDOCRINE: Negative for cold or heat intolerance, polydipsia or goiter.   PSYCH:  +depression or anxiety symptoms.   NEURO: As Above.   Vital Signs:  BP 120/70   Pulse 88   Ht 5' (1.524 m)   Wt 160 lb 8 oz (72.8 kg)   SpO2 94%   BMI 31.35 kg/m    Neurological Exam: MENTAL STATUS including orientation to time,  place, person, recent and remote memory is fairly intact. Severely blunted affect.   CRANIAL NERVES:  Pupils round and reactive to light. Extraocular muscles are intact. There is mild flattening of the right nasolabial fold and her smile is asymmetric. Facial muscles are intact. Palate elevates symmetrically and tongue is midline.  Speech is mildly slurred especially with guttural sounds.  MOTOR:  Motor strength is 5/5 in proximal muscles. Tone is normal throughout.  There is no resting hand tremor (improved)  COORDINATION/GAIT:  Finger tapping is improved and there is no bradykinesia.  Toe tapping is still slowed.  Gait is stooped, much more steady with walker, with normal cadence.  There is no shuffling.  Data: EMG 01/15/2013:  Taken together, these findings are consistent with a moderate generalized, large fiber, sensorimotor polyneuropathy affecting the left side. There is a superimposed intraspinal canal lesion (i.e. radiculopathy) affecting the L2-L4 nerve  root/segments bilaterally, moderate in degree electrically.  MRI/A Brain 07/03/2014: 1. Acute nonhemorrhagic 21 mm white matter infarct of the posterior right frontal lobe. 2. Extensive atrophy and diffuse white matter disease corresponds to advanced chronic microvascular ischemia. 3. Moderate to high-grade stenosis of the infra posterior right M2 branch. 4. Moderate small vessel disease is evident on the MRA. 5. High-grade stenosis in the proximal left P2 segment.  MRI/A brain wo contrast 04/27/2016: 1. Subcentimeter acute/early subacute infarct within the left frontal centrum semiovale. No acute hemorrhage. 2. Stable advanced chronic microvascular ischemic changes and moderate volume loss of the brain. 3. No evidence of large vessel occlusion, or aneurysm of the circle of Willis. Intracranial atherosclerosis with short segments of stenosis most pronounced in the left posterior cerebral artery.  US carotids 04/29/2016:  1-39% ICA stenosis  bilaterally TTE 04/29/2016:  EF 55-60%, no thrombus  Lab Results  Component Value Date   TSH 4.89 (H) 01/29/2015   Lab Results  Component Value Date   HGBA1C 5.7 (H) 04/28/2016   Lab Results  Component Value Date   VITAMINB12 322 10/13/2015   Lab Results  Component Value Date   CHOL 139 04/28/2016   HDL 70 04/28/2016   LDLCALC 57 04/28/2016   LDLDIRECT 101.1 10/21/2010   TRIG 61 04/28/2016   CHOLHDL 2.0 04/28/2016     IMPRESSION/PLAN:   1.  Subacute R corona radiata due to small vessel disease (Jan 2018).  Clinically, with mild right flattening of the nasolabial fold and mild dysarthria.  - Continue plavix 75mg  and aspirin 325mg  for three months, then monotherapy with plavix 75mg  daily  - Secondary risk factors are well-controlled, no diabetes or hypertension, LDL 57 on pravastatin 20mg .  She has family history of cardiovascular disease, including stroke  - Continue home PT and speech therapy  - Stroke warning signs discussed  2.  Vascular parkinsonism, REM behavior disorder  - improved with sinemet.    - Continue sinemet 25/100 1 tab TID  - Fall precautions discussed  - Continue to use rollator  - Continue physical therapy  3. Chronic tension headaches  - Hopefully increase nortriptyline to 50mg  will help with this  - Limit tylenol and Excedrin to twice per week  - Consider prednisone one week taper, going forward  - She may try magnesium oxide 400-600 mg daily   4.  Peripheral neuropathy and gait instability   - Increase nortriptyline to 50mg  qhs for painful paresthesias and headaches   5.  Senile dementia  6.  Advanced age  - We discussed goals of care moving forward and she has already set up POA, Advanced Directives (DNR), and Living Will  She had many questions which I answered to the best of my ability.  Return to clinic in 31-months   The duration of this appointment visit was 40 minutes of face-to-face time with the patient.  Greater than 50% of this  time was spent in counseling, explanation of diagnosis, planning of further management, and coordination of care.   Thank you for allowing me to participate in patient's care.  If I can answer any additional questions, I would be pleased to do so.    Sincerely,    Donika K. Posey Pronto, DO

## 2016-05-26 ENCOUNTER — Telehealth: Payer: Self-pay | Admitting: Internal Medicine

## 2016-05-26 DIAGNOSIS — I69991 Dysphagia following unspecified cerebrovascular disease: Secondary | ICD-10-CM | POA: Diagnosis not present

## 2016-05-26 DIAGNOSIS — I69354 Hemiplegia and hemiparesis following cerebral infarction affecting left non-dominant side: Secondary | ICD-10-CM | POA: Diagnosis not present

## 2016-05-26 DIAGNOSIS — I10 Essential (primary) hypertension: Secondary | ICD-10-CM | POA: Diagnosis not present

## 2016-05-26 DIAGNOSIS — G20 Parkinson's disease: Secondary | ICD-10-CM | POA: Diagnosis not present

## 2016-05-26 DIAGNOSIS — I69322 Dysarthria following cerebral infarction: Secondary | ICD-10-CM | POA: Diagnosis not present

## 2016-05-26 DIAGNOSIS — R1312 Dysphagia, oropharyngeal phase: Secondary | ICD-10-CM | POA: Diagnosis not present

## 2016-05-26 NOTE — Telephone Encounter (Signed)
Pt called in and said that dr crawford was suppose to send her in some type of meds last Friday.  She was unsure and wanted a nurse to give her a call back

## 2016-05-26 NOTE — Telephone Encounter (Signed)
Contacted pharmacy and they stated she should have enough left till the end of next month, called patient and stated what pharmacy said, she asked her home nurse and they stated that she does have enough of the medication

## 2016-05-28 DIAGNOSIS — I69322 Dysarthria following cerebral infarction: Secondary | ICD-10-CM | POA: Diagnosis not present

## 2016-05-28 DIAGNOSIS — R1312 Dysphagia, oropharyngeal phase: Secondary | ICD-10-CM | POA: Diagnosis not present

## 2016-05-28 DIAGNOSIS — I69991 Dysphagia following unspecified cerebrovascular disease: Secondary | ICD-10-CM | POA: Diagnosis not present

## 2016-05-28 DIAGNOSIS — I10 Essential (primary) hypertension: Secondary | ICD-10-CM | POA: Diagnosis not present

## 2016-05-28 DIAGNOSIS — I69354 Hemiplegia and hemiparesis following cerebral infarction affecting left non-dominant side: Secondary | ICD-10-CM | POA: Diagnosis not present

## 2016-05-28 DIAGNOSIS — G20 Parkinson's disease: Secondary | ICD-10-CM | POA: Diagnosis not present

## 2016-05-31 DIAGNOSIS — H35033 Hypertensive retinopathy, bilateral: Secondary | ICD-10-CM | POA: Diagnosis not present

## 2016-05-31 DIAGNOSIS — H353132 Nonexudative age-related macular degeneration, bilateral, intermediate dry stage: Secondary | ICD-10-CM | POA: Diagnosis not present

## 2016-06-01 DIAGNOSIS — I69991 Dysphagia following unspecified cerebrovascular disease: Secondary | ICD-10-CM

## 2016-06-01 DIAGNOSIS — I69322 Dysarthria following cerebral infarction: Secondary | ICD-10-CM | POA: Diagnosis not present

## 2016-06-01 DIAGNOSIS — I69354 Hemiplegia and hemiparesis following cerebral infarction affecting left non-dominant side: Secondary | ICD-10-CM

## 2016-06-01 DIAGNOSIS — F320 Major depressive disorder, single episode, mild: Secondary | ICD-10-CM | POA: Diagnosis not present

## 2016-06-01 DIAGNOSIS — R1312 Dysphagia, oropharyngeal phase: Secondary | ICD-10-CM

## 2016-06-01 DIAGNOSIS — G20 Parkinson's disease: Secondary | ICD-10-CM | POA: Diagnosis not present

## 2016-06-01 DIAGNOSIS — I2510 Atherosclerotic heart disease of native coronary artery without angina pectoris: Secondary | ICD-10-CM | POA: Diagnosis not present

## 2016-06-01 DIAGNOSIS — I10 Essential (primary) hypertension: Secondary | ICD-10-CM | POA: Diagnosis not present

## 2016-06-02 DIAGNOSIS — G20 Parkinson's disease: Secondary | ICD-10-CM | POA: Diagnosis not present

## 2016-06-02 DIAGNOSIS — I10 Essential (primary) hypertension: Secondary | ICD-10-CM | POA: Diagnosis not present

## 2016-06-02 DIAGNOSIS — I69354 Hemiplegia and hemiparesis following cerebral infarction affecting left non-dominant side: Secondary | ICD-10-CM | POA: Diagnosis not present

## 2016-06-02 DIAGNOSIS — I69322 Dysarthria following cerebral infarction: Secondary | ICD-10-CM | POA: Diagnosis not present

## 2016-06-02 DIAGNOSIS — I69991 Dysphagia following unspecified cerebrovascular disease: Secondary | ICD-10-CM | POA: Diagnosis not present

## 2016-06-02 DIAGNOSIS — R1312 Dysphagia, oropharyngeal phase: Secondary | ICD-10-CM | POA: Diagnosis not present

## 2016-06-04 DIAGNOSIS — G20 Parkinson's disease: Secondary | ICD-10-CM | POA: Diagnosis not present

## 2016-06-04 DIAGNOSIS — I69322 Dysarthria following cerebral infarction: Secondary | ICD-10-CM | POA: Diagnosis not present

## 2016-06-04 DIAGNOSIS — R1312 Dysphagia, oropharyngeal phase: Secondary | ICD-10-CM | POA: Diagnosis not present

## 2016-06-04 DIAGNOSIS — I69991 Dysphagia following unspecified cerebrovascular disease: Secondary | ICD-10-CM | POA: Diagnosis not present

## 2016-06-04 DIAGNOSIS — I69354 Hemiplegia and hemiparesis following cerebral infarction affecting left non-dominant side: Secondary | ICD-10-CM | POA: Diagnosis not present

## 2016-06-04 DIAGNOSIS — I10 Essential (primary) hypertension: Secondary | ICD-10-CM | POA: Diagnosis not present

## 2016-06-08 DIAGNOSIS — I69354 Hemiplegia and hemiparesis following cerebral infarction affecting left non-dominant side: Secondary | ICD-10-CM | POA: Diagnosis not present

## 2016-06-08 DIAGNOSIS — I10 Essential (primary) hypertension: Secondary | ICD-10-CM | POA: Diagnosis not present

## 2016-06-08 DIAGNOSIS — I69991 Dysphagia following unspecified cerebrovascular disease: Secondary | ICD-10-CM | POA: Diagnosis not present

## 2016-06-08 DIAGNOSIS — R1312 Dysphagia, oropharyngeal phase: Secondary | ICD-10-CM | POA: Diagnosis not present

## 2016-06-08 DIAGNOSIS — I69322 Dysarthria following cerebral infarction: Secondary | ICD-10-CM | POA: Diagnosis not present

## 2016-06-08 DIAGNOSIS — G20 Parkinson's disease: Secondary | ICD-10-CM | POA: Diagnosis not present

## 2016-06-09 DIAGNOSIS — I69354 Hemiplegia and hemiparesis following cerebral infarction affecting left non-dominant side: Secondary | ICD-10-CM | POA: Diagnosis not present

## 2016-06-09 DIAGNOSIS — R1312 Dysphagia, oropharyngeal phase: Secondary | ICD-10-CM | POA: Diagnosis not present

## 2016-06-09 DIAGNOSIS — I10 Essential (primary) hypertension: Secondary | ICD-10-CM | POA: Diagnosis not present

## 2016-06-09 DIAGNOSIS — I69991 Dysphagia following unspecified cerebrovascular disease: Secondary | ICD-10-CM | POA: Diagnosis not present

## 2016-06-09 DIAGNOSIS — G20 Parkinson's disease: Secondary | ICD-10-CM | POA: Diagnosis not present

## 2016-06-09 DIAGNOSIS — I69322 Dysarthria following cerebral infarction: Secondary | ICD-10-CM | POA: Diagnosis not present

## 2016-06-11 DIAGNOSIS — I69354 Hemiplegia and hemiparesis following cerebral infarction affecting left non-dominant side: Secondary | ICD-10-CM | POA: Diagnosis not present

## 2016-06-11 DIAGNOSIS — I10 Essential (primary) hypertension: Secondary | ICD-10-CM | POA: Diagnosis not present

## 2016-06-11 DIAGNOSIS — I69322 Dysarthria following cerebral infarction: Secondary | ICD-10-CM | POA: Diagnosis not present

## 2016-06-11 DIAGNOSIS — R1312 Dysphagia, oropharyngeal phase: Secondary | ICD-10-CM | POA: Diagnosis not present

## 2016-06-11 DIAGNOSIS — I69991 Dysphagia following unspecified cerebrovascular disease: Secondary | ICD-10-CM | POA: Diagnosis not present

## 2016-06-11 DIAGNOSIS — G20 Parkinson's disease: Secondary | ICD-10-CM | POA: Diagnosis not present

## 2016-06-14 ENCOUNTER — Ambulatory Visit (INDEPENDENT_AMBULATORY_CARE_PROVIDER_SITE_OTHER): Payer: Medicare Other | Admitting: Family Medicine

## 2016-06-14 ENCOUNTER — Encounter: Payer: Self-pay | Admitting: Family Medicine

## 2016-06-14 ENCOUNTER — Ambulatory Visit: Payer: Self-pay

## 2016-06-14 VITALS — BP 130/70 | HR 95 | Ht 60.0 in | Wt 163.0 lb

## 2016-06-14 DIAGNOSIS — M4698 Unspecified inflammatory spondylopathy, sacral and sacrococcygeal region: Secondary | ICD-10-CM | POA: Diagnosis not present

## 2016-06-14 DIAGNOSIS — M170 Bilateral primary osteoarthritis of knee: Secondary | ICD-10-CM | POA: Diagnosis not present

## 2016-06-14 DIAGNOSIS — I63511 Cerebral infarction due to unspecified occlusion or stenosis of right middle cerebral artery: Secondary | ICD-10-CM | POA: Diagnosis not present

## 2016-06-14 DIAGNOSIS — M25562 Pain in left knee: Secondary | ICD-10-CM

## 2016-06-14 DIAGNOSIS — M47818 Spondylosis without myelopathy or radiculopathy, sacral and sacrococcygeal region: Secondary | ICD-10-CM

## 2016-06-14 NOTE — Progress Notes (Signed)
CC: Back pain followup  HPI: Patient is a very pleasant 83 year old female coming in with chronic back pain. Was seen one month ago and given a sacroiliac injection. Been quite some time since previous one. Worsening pain Mild radiation difficult to walk.   Left knee pain. Patient is having worsening symptoms. Last time patient was having more right-sided knee pain. This time increasing instability of the left sign. This is secondary to her most recent stroke and having some weakness.    Previous imaging studies XR lumbar spine 06/07/2012: Findings: There is curvature convex to the left. There is advanced degenerative disc disease at T12-L1, L1-2 and L4-5. The L5-S1 level is transitional. There is anterolisthesis of 3 mm of L4-5.  There is retrolisthesis of 3 mm at T12-L1 and L1-2. These degenerative changes have worsened since 2010. No evidence of fracture.  IMPRESSION:  Curvature convex to the left. Worsening of degenerative disc disease at T12-L1, L1-2 and L4-5.   Past medical, surgical, family and social history reviewed. Medications reviewed all in the electronic medical record.  Review of Systems: No headache, visual changes, nausea, vomiting, diarrhea, constipation, dizziness, abdominal pain, skin rash, fevers, chills, night sweats, weight loss, swollen lymph nodes,, chest pain, shortness of breath, mood changes.  Positive muscle aches and body aches mild confusion  Objective:    Blood pressure 130/70, pulse 95, height 5' (1.524 m), weight 163 lb (73.9 kg), SpO2 98 %.   As stated and reviewed as of 06/14/16  General: No apparent distress alert and oriented x3 mood and affect normal, dressed appropriately. Mild left-sided face droop HEENT: Pupils equal, extraocular movements intact Respiratory: Patient's speak in full sentences and does not appear short of breath Cardiovascular: 1+ pitting edema to mid thigh bilaterally Skin: Warm dry intact with no signs of infection or rash on  extremities or on axial skeleton. Abdomen: Soft nontender Neuro: Cranial nerves II through XII are intact, neurovascularly intact in all extremities with 2+ DTRs and 2+ pulses. Lymph: No lymphadenopathy of posterior or anterior cervical chain or axillae bilaterally.  Gait continues to have unsteadiness and shuffling gait but uses a walker. MSK: Arthritic changes of multiple joints. Back Exam:  Inspection: Kyphosis and scoliosis noted Motion: Flexion 25 deg, Extension 5 deg, Side Bending to 15 deg bilaterally,  Rotation to 15 deg worsening range of motion Continues to have cogwheeling of the lower extremities with testing  Palpable tenderness: Continued worsening sacroiliac pain Sensory change: Gross sensation intact to all lumbar and sacral dermatomes.  Reflexes: 2+ at both patellar tendons, 2+ at achilles tendons, Babinski's downgoing.  Strength at foot  4 out of 5 strength in the left sign that is new from her previous stroke.  Procedure: Real-time Ultrasound Guided Injection of right sacroiliac joint Device: GE Logiq Q7 Ultrasound guided injection is preferred based studies that show increased duration, increased effect, greater accuracy, decreased procedural pain, increased response rate, and decreased cost with ultrasound guided versus blind injection.  Verbal informed consent obtained.  Time-out conducted.  Noted no overlying erythema, induration, or other signs of local infection.  Skin prepped in a sterile fashion.  Local anesthesia: Topical Ethyl chloride.  With sterile technique and under real time ultrasound guidance:  With a 21-gauge 2 inch needle patient was injected with a total of 0.5 mL of 0.5% Marcaine and 1 mL of Kenalog 40 mg/dL. Tolerated the procedure well. Completed without difficulty  Pain immediately resolved suggesting accurate placement of the medication.  Advised to call if fevers/chills,  erythema, induration, drainage, or persistent bleeding.  Images  permanently stored and available for review in the ultrasound unit.  Impression: Technically successful ultrasound guided injection.  After informed written and verbal consent, patient was seated on exam table. Left knee was prepped with alcohol swab and utilizing anterolateral approach, patient's left knee space was injected with 4:1  marcaine 0.5%: Kenalog 40mg /dL. Patient tolerated the procedure well without immediate complications.  Impression and Recommendations:     This case required medical decision making of moderate complexity.

## 2016-06-14 NOTE — Assessment & Plan Note (Signed)
Patient given injection and the skin with worsening symptoms. Tolerated the procedure well. We discussed icing regimen and home exercises. We discussed which activities to do an which was to avoid. Encourage patient to increase activity as tolerated. Patient will follow-up with me again in 12 weeks.

## 2016-06-14 NOTE — Assessment & Plan Note (Signed)
Injected left knee again. We discussed with patient about different treatment options. Continuing to do this on a regular basis if needed. Patient is having it done every 6-7 months. Patient can get a custom brace. I do think that patient's other comorbidities and make this difficult to put on and off on a regular basis. Follow-up again as needed.

## 2016-06-15 DIAGNOSIS — I69322 Dysarthria following cerebral infarction: Secondary | ICD-10-CM | POA: Diagnosis not present

## 2016-06-15 DIAGNOSIS — I69991 Dysphagia following unspecified cerebrovascular disease: Secondary | ICD-10-CM | POA: Diagnosis not present

## 2016-06-15 DIAGNOSIS — I69354 Hemiplegia and hemiparesis following cerebral infarction affecting left non-dominant side: Secondary | ICD-10-CM | POA: Diagnosis not present

## 2016-06-15 DIAGNOSIS — R1312 Dysphagia, oropharyngeal phase: Secondary | ICD-10-CM | POA: Diagnosis not present

## 2016-06-15 DIAGNOSIS — I10 Essential (primary) hypertension: Secondary | ICD-10-CM | POA: Diagnosis not present

## 2016-06-15 DIAGNOSIS — G20 Parkinson's disease: Secondary | ICD-10-CM | POA: Diagnosis not present

## 2016-06-18 ENCOUNTER — Ambulatory Visit (INDEPENDENT_AMBULATORY_CARE_PROVIDER_SITE_OTHER): Payer: Medicare Other | Admitting: Neurology

## 2016-06-18 ENCOUNTER — Encounter: Payer: Self-pay | Admitting: Neurology

## 2016-06-18 VITALS — BP 120/70 | HR 89 | Ht 60.0 in | Wt 161.0 lb

## 2016-06-18 DIAGNOSIS — G4486 Cervicogenic headache: Secondary | ICD-10-CM

## 2016-06-18 DIAGNOSIS — R51 Headache: Secondary | ICD-10-CM

## 2016-06-18 DIAGNOSIS — G609 Hereditary and idiopathic neuropathy, unspecified: Secondary | ICD-10-CM

## 2016-06-18 DIAGNOSIS — I63511 Cerebral infarction due to unspecified occlusion or stenosis of right middle cerebral artery: Secondary | ICD-10-CM

## 2016-06-18 DIAGNOSIS — I639 Cerebral infarction, unspecified: Secondary | ICD-10-CM | POA: Diagnosis not present

## 2016-06-18 DIAGNOSIS — G20 Parkinson's disease: Secondary | ICD-10-CM | POA: Diagnosis not present

## 2016-06-18 NOTE — Patient Instructions (Addendum)
1.  Continue aspirin and plavix until end of April.  Starting in May, STOP aspirin and continue plavix 5mg  alone.   2.  Continue speech therapy   3.  Continue your home exercises.  We will send a referral for home physical therapy, but if they cannot come, we may need to start out-patient therapy  Return to clinic in 2-3 months

## 2016-06-18 NOTE — Progress Notes (Signed)
[facility]  Follow-up Visit   Date: 06/18/16   Sarah [name] MRN: [id number] DOB: 08/11/1926   Interim History: Sarah [name] is a 83 y.o. right-handed Caucasian female with history of HTN, HPL, depression, esophageal strictures, and GERD returning to the clinic for follow-up of right facial droop and dysarthria due to right corona radiata stroke and headaches.  She has previously been seen here for Parkinson's disease and gait instability.  The patient was accompanied to the clinic by her caregiver, [name].  History of present illness: Leg weakness started slowly around May 2014 when she noticed difficulty with walking and tripping on things. She would stumble frequently, no falls.  She has associated bilateral achy knee pain and chronic back pain.  Forward flexion makes her pain worse and it is improved by laying down. EMG was performed and shows moderate peripheral neuropathy of the legs and L3-L4 old radiculopathy.   Caregiver says that she is more forgetful than usual, especially with conversation.    In April 2016, she was hospitalization for acute stroke manifested with slurred speech which has since resolved.  She was started on Plavix 75 mg for this.  During the fall of 2016, she started experiencing bifrontal throbbing headaches. She was give tizanidine, but did not tolerate this.  Neck PT helped. Her headaches are much improved since she started taking ginger supplements.   In June 2017 she was diagnosed with parkinsonism she noticed that her movements and become slower and she is able to do less work. She notices a marked difference in her walking when she is taking sinemet 1 tab TID.  UPDATE 05/25/2016:  Patient developed acute onset of dysarthria, right facial droop while at her PCPs office and was transferred to the emergency department for evaluation. She was found to have a small left frontal corona radiata stroke secondary to small vessel disease.  IV TPA was not administered due to mild symptoms. Echo was normal, no thrombus.  US carotids showed no significant stenosis.  She was taking plavix 75mg  and with her new event, aspirin was added.  DAPT for 3 months recommended, then plavix alone.  She was transferred to skilled nursing facility from 2/2-2/15/2018 and then discharged home where she continues to get home PT and speech therapy. She declined home occupational therapy last week due to being overwhelmed with people in her home. She feels that her speech and facial droop are slowly improving. She denies any difficulty with swallowing and is mostly having soft foods. She denies any new weakness.  Her secondary major complaint is new onset of bifrontal throbbing headaches. She was given a prescription for topiramate in the hospital, but did not take this. She also complains of cold painful feet, especially at bedtime.  UPDATE 3/23/20018:  Since increasing nortriptyline 50mg  her headaches and neuropathy have improved.  She has not had a headache in the past three days. She had noticed worsening dry mouth, but it is not bothersome enough to make changes to her medication. She continues to have slurred speech and has had two session of speech therapy.  She continues to feel deconditioned and weak and reports only having four session of PT at home and is very interested in doing more.  [name] states that she is not complaint with her home exercises.  Gait is stable, assisted with walker and no falls.  Medications:  Current Outpatient Prescriptions on File Prior to Visit  Medication Sig Dispense Refill  . acetaminophen (TYLENOL)  500 MG tablet Take 1 tablet (500 mg total) by mouth every 8 (eight) hours as needed (for pain). 180 tablet 2  . ALPRAZolam (XANAX) 0.5 MG tablet Take 1 tablet (0.5 mg total) by mouth 2 (two) times daily. 60 tablet 1  . aspirin 325 MG EC tablet Take 1 tablet (325 mg total) by mouth daily. 30 tablet 0  . carbidopa-levodopa (SINEMET  IR) 25-100 MG tablet Take 1 tablet by mouth 3 (three) times daily. 270 tablet 3  . Cholecalciferol (VITAMIN D PO) Take 1 tablet by mouth daily.     . clopidogrel (PLAVIX) 75 MG tablet Take 1 tablet (75 mg total) by mouth daily. 90 tablet 2  . diclofenac sodium (VOLTAREN) 1 % GEL Apply  To feet 3 times daily prn for pain 1 Tube 5  . nitroGLYCERIN (NITROSTAT) 0.4 MG SL tablet Place 1 tablet (0.4 mg total) under the tongue every 5 (five) minutes as needed for chest pain. 50 tablet prn  . nortriptyline (PAMELOR) 50 MG capsule Take 1 capsule (50 mg total) by mouth at bedtime. 30 capsule 11  . ofloxacin (OCUFLOX) 0.3 % ophthalmic solution   0  . pantoprazole (PROTONIX) 40 MG tablet Take 1 tablet (40 mg total) by mouth daily. 30 tablet 11  . perphenazine (TRILAFON) 2 MG tablet Take 1 tablet (2 mg total) by mouth at bedtime. 30 tablet 2  . polyethylene glycol (MIRALAX / GLYCOLAX) packet Take 17 g by mouth daily. 14 each 0  . pravastatin (PRAVACHOL) 20 MG tablet TAKE (1/2) TABLET DAILY. 45 tablet 3  . prednisoLONE acetate (PRED FORTE) 1 % ophthalmic suspension Place 1 drop into the right eye 2 (two) times daily. 5 mL 0  . solifenacin (VESICARE) 10 MG tablet TAKE (1) TABLET DAILY AS NEEDED FOR BLADDER 30 tablet 0  . triazolam (HALCION) 0.25 MG tablet Take 0.5 tablets (0.125 mg total) by mouth at bedtime as needed for sleep. 15 tablet 2   No current facility-administered medications on file prior to visit.     Allergies:  Allergies  Allergen Reactions  . Lipitor [Atorvastatin Calcium] Other (See Comments)    Leg weakness,memory loss  . Lovastatin Other (See Comments)    Leg weakness  . Celebrex [Celecoxib] Itching and Rash  . Latex Rash  . Macrobid [employer] Macro] Other (See Comments)    Unknown per patient  . Other Other (See Comments)    Always pads     Review of Systems:  CONSTITUTIONAL: No fevers, chills, night sweats, or weight loss.   EYES: No visual changes or eye  pain ENT: No hearing changes.  No history of nose bleeds. RESPIRATORY: No cough, wheezing and shortness of breath.   CARDIOVASCULAR: Negative for chest pain, and palpitations.   GI: Negative for abdominal discomfort, blood in stools or black stools.  No recent change in bowel habits.  GU:  No history of incontinence.   MUSCLOSKELETAL: +history of joint pain or swelling.  +myalgias.  + back pain SKIN: Negative for lesions, rash, and itching.   HEMATOLOGY/ONCOLOGY: Negative for prolonged bleeding, bruising easily, and swollen nodes.   ENDOCRINE: Negative for cold or heat intolerance, polydipsia or goiter.   PSYCH:  +depression or anxiety symptoms.   NEURO: As Above.   Vital Signs:  BP 120/70   Pulse 89   Ht 5' (1.524 m)   Wt 161 lb (73 kg)   SpO2 96%   BMI 31.44 kg/m   Neurological Exam: MENTAL STATUS including orientation to  time, place, person, recent and remote memory is fairly intact. Severely blunted affect.   CRANIAL NERVES:  Pupils round and reactive to light. Extraocular muscles are intact. There is mild flattening of the right nasolabial fold and her smile is asymmetric. Facial muscles are intact. Palate elevates symmetrically and tongue is midline.  Speech is mildly slurred especially with lingual sounds.  MOTOR:  Motor strength is 5/5 in proximal muscles. Tone is normal throughout.  There is no resting hand tremor.  COORDINATION/GAIT:  Finger tapping is improved and there is no bradykinesia.  Toe tapping is still slowed.  Gait is stooped, steady with walker, with normal cadence.   Data: EMG 01/15/2013:  Taken together, these findings are consistent with a moderate generalized, large fiber, sensorimotor polyneuropathy affecting the left side. There is a superimposed intraspinal canal lesion (i.e. radiculopathy) affecting the L2-L4 nerve root/segments bilaterally, moderate in degree electrically.  MRI/A Brain 07/03/2014: 1. Acute nonhemorrhagic 21 mm white matter infarct of  the posterior right frontal lobe. 2. Extensive atrophy and diffuse white matter disease corresponds to advanced chronic microvascular ischemia. 3. Moderate to high-grade stenosis of the infra posterior right M2 branch. 4. Moderate small vessel disease is evident on the MRA. 5. High-grade stenosis in the proximal left P2 segment.  MRI/A brain wo contrast 04/27/2016: 1. Subcentimeter acute/early subacute infarct within the left frontal centrum semiovale. No acute hemorrhage. 2. Stable advanced chronic microvascular ischemic changes and moderate volume loss of the brain. 3. No evidence of large vessel occlusion, or aneurysm of the circle of Willis. Intracranial atherosclerosis with short segments of stenosis most pronounced in the left posterior cerebral artery.  US carotids 04/29/2016:  1-39% ICA stenosis bilaterally TTE 04/29/2016:  EF 55-60%, no thrombus  Lab Results  Component Value Date   TSH 4.89 (H) 01/29/2015   Lab Results  Component Value Date   HGBA1C 5.7 (H) 04/28/2016   Lab Results  Component Value Date   VITAMINB12 322 10/13/2015   Lab Results  Component Value Date   CHOL 139 04/28/2016   HDL 70 04/28/2016   LDLCALC 57 04/28/2016   LDLDIRECT 101.1 10/21/2010   TRIG 61 04/28/2016   CHOLHDL 2.0 04/28/2016     IMPRESSION/PLAN:   1.  Subacute R corona radiata due to small vessel disease (Jan 2018).  Clinically, with mild right flattening of the nasolabial fold and dysarthria.  - Continue plavix 75mg  and aspirin 325mg  for three months, then monotherapy with plavix 75mg  daily (May)  - Secondary risk factors are well-controlled, no diabetes or hypertension, LDL 57 on pravastatin 20mg .   - Continue home PT and speech therapy  - Stroke warning signs discussed  2.  Vascular parkinsonism, REM behavior disorder  - improved with sinemet.    - Continue sinemet 25/100 1 tab TID  - Fall precautions discussed  - Continue to use rollator  - Will start home physical therapy for gait  training and deconditioning  3. Chronic tension headaches, improved!  - Continue nortriptyline to 50mg .  Avoid increasing this further due to dry mouth.  - Limit tylenol and Excedrin to twice per week   4.  Peripheral neuropathy and gait instability   - Continue nortriptyline to 50mg  qhs which helps  5.  Senile dementia  Return to clinic in 2-3 months   The duration of this appointment visit was 25 minutes of face-to-face time with the patient.  Greater than 50% of this time was spent in counseling, explanation of diagnosis, planning of further management,  and coordination of care.   Thank you for allowing me to participate in patient's care.  If I can answer any additional questions, I would be pleased to do so.    Sincerely,    Guy Toney K. Posey Pronto, DO

## 2016-06-21 ENCOUNTER — Telehealth: Payer: Self-pay | Admitting: Neurology

## 2016-06-21 NOTE — Telephone Encounter (Signed)
Patient left message on voice and asked for Caryl Pina to call her ASAP did not say what it was about [phone]

## 2016-06-21 NOTE — Telephone Encounter (Signed)
I spoke with patient and she just wanted to make sure that we get her PT started back with [facility].

## 2016-06-22 ENCOUNTER — Telehealth: Payer: Self-pay | Admitting: Internal Medicine

## 2016-06-22 NOTE — Telephone Encounter (Signed)
Patient has had a family situation and will not have therapy today.  Will add visit to the end.  Needs call back with an OK.

## 2016-06-22 NOTE — Telephone Encounter (Signed)
Given

## 2016-06-22 NOTE — Telephone Encounter (Signed)
Fine

## 2016-06-23 DIAGNOSIS — G20 Parkinson's disease: Secondary | ICD-10-CM | POA: Diagnosis not present

## 2016-06-23 DIAGNOSIS — I10 Essential (primary) hypertension: Secondary | ICD-10-CM | POA: Diagnosis not present

## 2016-06-23 DIAGNOSIS — R1312 Dysphagia, oropharyngeal phase: Secondary | ICD-10-CM | POA: Diagnosis not present

## 2016-06-23 DIAGNOSIS — I69322 Dysarthria following cerebral infarction: Secondary | ICD-10-CM | POA: Diagnosis not present

## 2016-06-23 DIAGNOSIS — I69991 Dysphagia following unspecified cerebrovascular disease: Secondary | ICD-10-CM | POA: Diagnosis not present

## 2016-06-23 DIAGNOSIS — I69354 Hemiplegia and hemiparesis following cerebral infarction affecting left non-dominant side: Secondary | ICD-10-CM | POA: Diagnosis not present

## 2016-06-24 ENCOUNTER — Other Ambulatory Visit: Payer: Self-pay | Admitting: Neurology

## 2016-06-28 ENCOUNTER — Other Ambulatory Visit: Payer: Self-pay | Admitting: *Deleted

## 2016-06-28 MED ORDER — CARBIDOPA-LEVODOPA 25-100 MG PO TABS: 1.0000 | ORAL_TABLET | Freq: Three times a day (TID) | ORAL | 3 refills | Status: DC

## 2016-06-28 NOTE — Telephone Encounter (Signed)
Rx sent 

## 2016-07-01 ENCOUNTER — Encounter: Payer: Self-pay | Admitting: Internal Medicine

## 2016-07-01 ENCOUNTER — Ambulatory Visit (INDEPENDENT_AMBULATORY_CARE_PROVIDER_SITE_OTHER): Payer: Medicare Other | Admitting: Internal Medicine

## 2016-07-01 VITALS — BP 128/70 | HR 89 | Resp 14 | Ht 60.0 in | Wt 162.0 lb

## 2016-07-01 DIAGNOSIS — G4489 Other headache syndrome: Secondary | ICD-10-CM | POA: Diagnosis not present

## 2016-07-01 DIAGNOSIS — E611 Iron deficiency: Secondary | ICD-10-CM

## 2016-07-01 DIAGNOSIS — R51 Headache: Secondary | ICD-10-CM

## 2016-07-01 DIAGNOSIS — R519 Headache, unspecified: Secondary | ICD-10-CM | POA: Insufficient documentation

## 2016-07-01 DIAGNOSIS — I63511 Cerebral infarction due to unspecified occlusion or stenosis of right middle cerebral artery: Secondary | ICD-10-CM | POA: Diagnosis not present

## 2016-07-01 MED ORDER — IPRATROPIUM BROMIDE 0.06 % NA SOLN: 2.0000 | Freq: Four times a day (QID) | NASAL | 12 refills | Status: AC

## 2016-07-01 MED ORDER — CYANOCOBALAMIN 1000 MCG/ML IJ SOLN
1000.0000 ug | Freq: Once | INTRAMUSCULAR | Status: AC
  Administered 2016-07-01: 1000 ug via INTRAMUSCULAR

## 2016-07-01 NOTE — Patient Instructions (Addendum)
We have sent in the nose spray which you can use up to 3 times per day for the blockages.   We have given you the B12 shot.   You can use salt water (saline) eye drops for the eyes as often as you need.   Keep using the tylenol for the headaches if needed.

## 2016-07-01 NOTE — Assessment & Plan Note (Signed)
Suspect sinus pressure headaches. Rx for atrovent nasal spray and encouraged to continue taking claritin. Avoid allergens as able. Take tylenol as needed for pain. Call back for any change to neurological status given recent stroke.

## 2016-07-01 NOTE — Progress Notes (Signed)
   Subjective:    Patient ID: Sarah [name], female    DOB: [birth year], 83 y.o.   MRN: [id number]  HPI The patient is an 83 YO female coming in for headaches. She is having more sinus congestion the last several days. Some pain in her jaw, temples, and around her eyes. Started yesterday and is present again today. She took tylenol for it and this helped. She took claritin yesterday but has not taken it yet today. She took tylenol this morning and it took some of the pain away. Maximum the headache is 6/10 and now after tylenol is 3/10. She denies fevers or chills. She denies cough or SOB. No change in speech or new facial droop. No change in swallowing ability. Her caretaker is with her and able to verify this information.   Review of Systems  Constitutional: Positive for fatigue. Negative for activity change, appetite change, fever and unexpected weight change.  HENT: Positive for congestion, ear pain, rhinorrhea, sinus pain and sinus pressure. Negative for dental problem, drooling, ear discharge, facial swelling, postnasal drip, sneezing, sore throat and trouble swallowing.   Eyes: Positive for photophobia, redness and itching.  Respiratory: Negative.   Cardiovascular: Negative.   Gastrointestinal: Negative.   Musculoskeletal: Negative.   Neurological: Positive for weakness and headaches. Negative for dizziness, tremors, seizures, syncope, speech difficulty and numbness.  Psychiatric/Behavioral: Negative.       Objective:   Physical Exam  Constitutional: She is oriented to person, place, and time. She appears well-developed and well-nourished.  HENT:  Head: Normocephalic and atraumatic.  Right Ear: External ear normal.  Left Ear: External ear normal.  Frontal sinuses with pressure, oropharynx with redness and clear drainage, nose with clear crusting.   Eyes: EOM are normal. Pupils are equal, round, and reactive to light.  Neck: Normal range of motion. No JVD present.  Cardiovascular:  Normal rate and regular rhythm.   Pulmonary/Chest: Effort normal and breath sounds normal.  Abdominal: Soft.  Lymphadenopathy:    She has no cervical adenopathy.  Neurological: She is alert and oriented to person, place, and time.  Speech stable from prior  Skin: Skin is warm and dry.   Vitals:   07/01/16 0932  BP: 128/70  Pulse: 89  Resp: 14  SpO2: 99%  Weight: 162 lb (73.5 kg)  Height: 5' (1.524 m)      Assessment & Plan:  B12 shot today

## 2016-07-01 NOTE — Progress Notes (Signed)
Pre visit review using our clinic review tool, if applicable. No additional management support is needed unless otherwise documented below in the visit note. 

## 2016-07-02 ENCOUNTER — Telehealth: Payer: Self-pay | Admitting: Internal Medicine

## 2016-07-02 NOTE — Telephone Encounter (Signed)
fine

## 2016-07-02 NOTE — Telephone Encounter (Signed)
Sarah [name] from [facility] called to let Dr Sharlet Salina know that she was unable to go visit with the pt for her speech therapy due to some time/scheduling conflicts. She will continue with therapy next week.

## 2016-07-09 ENCOUNTER — Other Ambulatory Visit: Payer: Self-pay | Admitting: Internal Medicine

## 2016-07-09 ENCOUNTER — Telehealth: Payer: Self-pay | Admitting: *Deleted

## 2016-07-09 DIAGNOSIS — G20 Parkinson's disease: Secondary | ICD-10-CM | POA: Diagnosis not present

## 2016-07-09 DIAGNOSIS — I69322 Dysarthria following cerebral infarction: Secondary | ICD-10-CM | POA: Diagnosis not present

## 2016-07-09 DIAGNOSIS — I69354 Hemiplegia and hemiparesis following cerebral infarction affecting left non-dominant side: Secondary | ICD-10-CM | POA: Diagnosis not present

## 2016-07-09 DIAGNOSIS — R1312 Dysphagia, oropharyngeal phase: Secondary | ICD-10-CM | POA: Diagnosis not present

## 2016-07-09 DIAGNOSIS — I69991 Dysphagia following unspecified cerebrovascular disease: Secondary | ICD-10-CM | POA: Diagnosis not present

## 2016-07-09 DIAGNOSIS — I10 Essential (primary) hypertension: Secondary | ICD-10-CM | POA: Diagnosis not present

## 2016-07-09 NOTE — Telephone Encounter (Addendum)
She can certainly stop aspirin, but I do not think that it is causing her nausea.  Because of her recent stroke, ideally, I want her on plavix and aspirin until May.  After May, she can continue plavix 75mg  alone.   Sinemet can cause nausea, but she has been on this medication for sometime and tolerating it well.  She can take sinemet with food, and see if it helps.  Sarah K. Posey Pronto, DO

## 2016-07-09 NOTE — Telephone Encounter (Signed)
Sarah [name] given instructions and will let Sarah [name] know.

## 2016-07-09 NOTE — Telephone Encounter (Signed)
Sarah [name] called to let us know that [name] is having some nausea.  She saw Dr. Sharlet Salina on the 5th for allergies and he put her on a nasal spray.  She thinks it may be the aspirin causing it and was asking if she could stop it.  Please advise.

## 2016-07-13 ENCOUNTER — Telehealth: Payer: Self-pay | Admitting: Neurology

## 2016-07-13 NOTE — Telephone Encounter (Signed)
Do not recommend making any medication changes until she is evaluated in the clinic, as her shakes could be due to other things besides Parkinson's. Which medication dig she start taking on Sunday?  If this to medication side effect, would recommend stopping the medication as her Parkinson's has been very stable.  Please place her on the wait list and we will contact her.  In the meantime, check urinalysis to be sure there is no superimposed infection.  Sarah K. Posey Pronto, DO

## 2016-07-13 NOTE — Telephone Encounter (Signed)
Please advise 

## 2016-07-13 NOTE — Telephone Encounter (Signed)
PD has not been bothering her since new meds until Sunday night.  Now having leg shakes. Does she increase her meds?  Please Advise.

## 2016-07-14 ENCOUNTER — Other Ambulatory Visit: Payer: Self-pay | Admitting: *Deleted

## 2016-07-14 DIAGNOSIS — R5381 Other malaise: Secondary | ICD-10-CM

## 2016-07-14 NOTE — Telephone Encounter (Signed)
Called patient back and spoke with Sarah [name].  Informed her that we would like to do a urinalysis and she said that she would bring patient in next week.  Also informed her that we would like to see patient sooner.  Will put on wait list.

## 2016-07-14 NOTE — Telephone Encounter (Signed)
Called patient.  No answer and no voicemail.  Will try again.

## 2016-07-15 ENCOUNTER — Telehealth: Payer: Self-pay | Admitting: Internal Medicine

## 2016-07-15 NOTE — Telephone Encounter (Signed)
I would give this several weeks to clear. Additionally taking otc pain medication for headaches can cause headaches so she should not just keeping adding medicines. Does she know what medications and how often she is taking.

## 2016-07-15 NOTE — Telephone Encounter (Signed)
States patient seen Dr. Sharlet Salina last week for allergies.  States last Friday patient called Dr. Posey Pronto to get medication for nausea.  States patient has continued to have headaches. Has started some additional OTC medication.  I have scheduled patient for 4pm 4/20 with Dr. Sharlet Salina to address concerns.  If patient does not need to keep this appointment and issue can be addressed by phone please follow up in regard.

## 2016-07-16 ENCOUNTER — Encounter: Payer: Self-pay | Admitting: Internal Medicine

## 2016-07-16 ENCOUNTER — Ambulatory Visit (INDEPENDENT_AMBULATORY_CARE_PROVIDER_SITE_OTHER): Payer: Medicare Other | Admitting: Internal Medicine

## 2016-07-16 DIAGNOSIS — J0110 Acute frontal sinusitis, unspecified: Secondary | ICD-10-CM | POA: Diagnosis not present

## 2016-07-16 DIAGNOSIS — I63511 Cerebral infarction due to unspecified occlusion or stenosis of right middle cerebral artery: Secondary | ICD-10-CM

## 2016-07-16 DIAGNOSIS — J019 Acute sinusitis, unspecified: Secondary | ICD-10-CM | POA: Insufficient documentation

## 2016-07-16 MED ORDER — AMOXICILLIN-POT CLAVULANATE 875-125 MG PO TABS: 1.0000 | ORAL_TABLET | Freq: Two times a day (BID) | ORAL | 0 refills | Status: DC

## 2016-07-16 MED ORDER — PREDNISONE 20 MG PO TABS: 40.0000 mg | ORAL_TABLET | Freq: Every day | ORAL | 0 refills | Status: DC

## 2016-07-16 NOTE — Progress Notes (Signed)
   Subjective:    Patient ID: Sarah [name], female    DOB: 1926-05-23, 83 y.o.   MRN: [id number]  HPI The patient is an 83 YO female coming in for sinus problems. She has had them ongoing for about 3 weeks now. Denies fevers or chills. Denies SOB but coughing still with sputum. She is having postnasal drip. Some sinus pressure in her face. Still having headaches. Taking tylenol daily for headaches. No ear pain. Is taking the atrovent nose spray which has helped some. Also taking otc allergy medication for several weeks without improvement.   Review of Systems  Constitutional: Positive for activity change, appetite change and fatigue. Negative for chills, fever and unexpected weight change.  HENT: Positive for congestion, postnasal drip, rhinorrhea, sinus pain, sinus pressure and sore throat. Negative for ear discharge, ear pain and trouble swallowing.   Eyes: Negative.   Respiratory: Positive for cough. Negative for chest tightness, shortness of breath and wheezing.   Cardiovascular: Negative.   Gastrointestinal: Negative.   Musculoskeletal: Positive for arthralgias, back pain and gait problem.  Neurological: Positive for dizziness, facial asymmetry, speech difficulty, weakness and headaches.       Stable change      Objective:   Physical Exam  Constitutional: She is oriented to person, place, and time. She appears well-developed and well-nourished.  HENT:  Head: Normocephalic and atraumatic.  TMs normal, oropharynx with redness and yellow drainage, nose with crusting, frontal sinus pressure.   Eyes: EOM are normal.  Neck: Normal range of motion. No JVD present.  Cardiovascular: Normal rate and regular rhythm.   Pulmonary/Chest: Effort normal and breath sounds normal.  Some scattered rhonchi which clear with coughing  Abdominal: Soft.  Lymphadenopathy:    She has no cervical adenopathy.  Neurological: She is alert and oriented to person, place, and time.  Skin: Skin is warm and  dry.   Vitals:   07/16/16 1558  BP: 138/70  Pulse: 89  Resp: 14  Temp: 97.6 F (36.4 C)  TempSrc: Oral  SpO2: 100%  Weight: 152 lb (68.9 kg)  Height: 5' (1.524 m)      Assessment & Plan:

## 2016-07-16 NOTE — Patient Instructions (Signed)
We have sent in prednisone. Take 2 pills daily for 5 days to dry up the sinuses.   We have also sent in augmentin. Take 1 pill twice a day for the next 10 days.

## 2016-07-16 NOTE — Progress Notes (Signed)
Pre visit review using our clinic review tool, if applicable. No additional management support is needed unless otherwise documented below in the visit note. 

## 2016-07-16 NOTE — Assessment & Plan Note (Signed)
Rx for prednisone and augmentin to help. Talked to her about the headaches as possibly being rebound headaches from chronic tylenol usage.

## 2016-07-16 NOTE — Telephone Encounter (Signed)
Contacted [name], patient has apt at 4 today

## 2016-07-19 ENCOUNTER — Telehealth: Payer: Self-pay | Admitting: Neurology

## 2016-07-19 ENCOUNTER — Other Ambulatory Visit (INDEPENDENT_AMBULATORY_CARE_PROVIDER_SITE_OTHER): Payer: Medicare Other

## 2016-07-19 ENCOUNTER — Other Ambulatory Visit: Payer: Self-pay | Admitting: *Deleted

## 2016-07-19 DIAGNOSIS — R52 Pain, unspecified: Secondary | ICD-10-CM

## 2016-07-19 LAB — URINALYSIS, ROUTINE W REFLEX MICROSCOPIC
BILIRUBIN URINE: NEGATIVE
Nitrite: NEGATIVE
Specific Gravity, Urine: 1.015 (ref 1.000–1.030)
Total Protein, Urine: NEGATIVE
URINE GLUCOSE: NEGATIVE
UROBILINOGEN UA: 1 (ref 0.0–1.0)
pH: 6.5 (ref 5.0–8.0)

## 2016-07-19 NOTE — Telephone Encounter (Signed)
Caller: Belita  Urgent? No  Reason for the call: She would like you to please call her regarding her labs from her Urine today. Thanks

## 2016-07-20 ENCOUNTER — Encounter: Payer: Self-pay | Admitting: Neurology

## 2016-07-20 ENCOUNTER — Ambulatory Visit (INDEPENDENT_AMBULATORY_CARE_PROVIDER_SITE_OTHER): Payer: Medicare Other | Admitting: Neurology

## 2016-07-20 VITALS — BP 110/70 | Ht 60.0 in | Wt 151.0 lb

## 2016-07-20 DIAGNOSIS — I639 Cerebral infarction, unspecified: Secondary | ICD-10-CM

## 2016-07-20 DIAGNOSIS — G20 Parkinson's disease: Secondary | ICD-10-CM | POA: Diagnosis not present

## 2016-07-20 DIAGNOSIS — I63511 Cerebral infarction due to unspecified occlusion or stenosis of right middle cerebral artery: Secondary | ICD-10-CM | POA: Diagnosis not present

## 2016-07-20 DIAGNOSIS — R2681 Unsteadiness on feet: Secondary | ICD-10-CM | POA: Diagnosis not present

## 2016-07-20 NOTE — Telephone Encounter (Signed)
Patient coming in today at 3:00.

## 2016-07-20 NOTE — Patient Instructions (Signed)
Continue your antibiotic course as prescribed.

## 2016-07-20 NOTE — Progress Notes (Signed)
[facility]  Follow-up Visit   Date: 07/20/16   Sarah [name] MRN: [id number] DOB: October 13, 1926   Interim History: Sarah [name] is a 83 y.o. right-handed Caucasian female with history of HTN, HPL, depression, esophageal strictures, and GERD returning to the clinic for follow-up of right facial droop and dysarthria due to right corona radiata stroke and headaches.  She has previously been seen here for Parkinson's disease and gait instability.  The patient was accompanied to the clinic by her caregiver, [name].  History of present illness: Leg weakness started slowly around May 2014 when she noticed difficulty with walking and tripping on things. She would stumble frequently, no falls.  She has associated bilateral achy knee pain and chronic back pain.  Forward flexion makes her pain worse and it is improved by laying down. EMG was performed and shows moderate peripheral neuropathy of the legs and L3-L4 old radiculopathy.   Caregiver says that she is more forgetful than usual, especially with conversation.    In April 2016, she was hospitalization for acute stroke manifested with slurred speech which has since resolved.  She was started on Plavix 75 mg for this.  During the fall of 2016, she started experiencing bifrontal throbbing headaches. She was give tizanidine, but did not tolerate this.  Neck PT helped. Her headaches are much improved since she started taking ginger supplements.   In June 2017 she was diagnosed with parkinsonism she noticed that her movements and become slower and she is able to do less work. She notices a marked difference in her walking when she is taking sinemet 1 tab TID.  UPDATE 05/25/2016:  Patient developed acute onset of dysarthria, right facial droop while at her PCPs office and was transferred to the emergency department for evaluation. She was found to have a small left frontal corona radiata stroke secondary to small vessel disease.  IV TPA was not administered due to mild symptoms. Echo was normal, no thrombus.  US carotids showed no significant stenosis.  She was taking plavix 75mg  and with her new event, aspirin was added.  DAPT for 3 months recommended, then plavix alone.  She was transferred to skilled nursing facility from 2/2-2/15/2018 and then discharged home where she continues to get home PT and speech therapy. She declined home occupational therapy last week due to being overwhelmed with people in her home. She feels that her speech and facial droop are slowly improving. She denies any difficulty with swallowing and is mostly having soft foods. She denies any new weakness.  Her secondary major complaint is new onset of bifrontal throbbing headaches. She was given a prescription for topiramate in the hospital, but did not take this. She also complains of cold painful feet, especially at bedtime.  UPDATE 3/23/20018:  Since increasing nortriptyline 50mg  her headaches and neuropathy have improved.  She has not had a headache in the past three days. She had noticed worsening dry mouth, but it is not bothersome enough to make changes to her medication. She continues to have slurred speech and has had two session of speech therapy.  She continues to feel deconditioned and weak and reports only having four session of PT at home and is very interested in doing more.  [name] states that she is not complaint with her home exercises.  Gait is stable, assisted with walker and no falls.  UPDATE 07/20/2016:  Patient requested for a sooner follow-up due to increased shaking of the legs. About 2 weeks  ago, she reports having increased shakiness of the legs only when she lays prone in the bed. Symptoms are not present when she is sitting or with activity. She also describes generalized malaise, increased pressure of the head, and hoarse voice. She recently saw her primary care doctor who prescribed Augmentin for sinusitis.  Her urinalysis also  suggestive of probable UTI.  She has been more sedentary and noncompliant with home exercises.  Medications:  Current Outpatient Prescriptions on File Prior to Visit  Medication Sig Dispense Refill  . acetaminophen (TYLENOL) 500 MG tablet Take 1 tablet (500 mg total) by mouth every 8 (eight) hours as needed (for pain). 180 tablet 2  . ALPRAZolam (XANAX) 0.5 MG tablet Take 1 tablet (0.5 mg total) by mouth 2 (two) times daily. 60 tablet 1  . amoxicillin-clavulanate (AUGMENTIN) 875-125 MG tablet Take 1 tablet by mouth 2 (two) times daily. 20 tablet 0  . aspirin 325 MG EC tablet Take 1 tablet (325 mg total) by mouth daily. 30 tablet 0  . carbidopa-levodopa (SINEMET IR) 25-100 MG tablet Take 1 tablet by mouth 3 (three) times daily. 270 tablet 3  . Cholecalciferol (VITAMIN D PO) Take 1 tablet by mouth daily.     . clopidogrel (PLAVIX) 75 MG tablet Take 1 tablet (75 mg total) by mouth daily. 90 tablet 2  . diclofenac sodium (VOLTAREN) 1 % GEL Apply  To feet 3 times daily prn for pain 1 Tube 5  . ipratropium (ATROVENT) 0.06 % nasal spray Place 2 sprays into both nostrils 4 (four) times daily. 15 mL 12  . nitroGLYCERIN (NITROSTAT) 0.4 MG SL tablet Place 1 tablet (0.4 mg total) under the tongue every 5 (five) minutes as needed for chest pain. 50 tablet prn  . nortriptyline (PAMELOR) 50 MG capsule Take 1 capsule (50 mg total) by mouth at bedtime. 30 capsule 11  . ofloxacin (OCUFLOX) 0.3 % ophthalmic solution   0  . pantoprazole (PROTONIX) 40 MG tablet Take 1 tablet (40 mg total) by mouth daily. 30 tablet 11  . perphenazine (TRILAFON) 2 MG tablet TAKE ONE TABLET AT BEDTIME. 30 tablet 3  . polyethylene glycol (MIRALAX / GLYCOLAX) packet Take 17 g by mouth daily. 14 each 0  . pravastatin (PRAVACHOL) 20 MG tablet TAKE (1/2) TABLET DAILY. 45 tablet 3  . prednisoLONE acetate (PRED FORTE) 1 % ophthalmic suspension Place 1 drop into the right eye 2 (two) times daily. 5 mL 0  . predniSONE (DELTASONE) 20 MG tablet  Take 2 tablets (40 mg total) by mouth daily with breakfast. 10 tablet 0  . solifenacin (VESICARE) 10 MG tablet TAKE (1) TABLET DAILY AS NEEDED FOR BLADDER 30 tablet 0  . triazolam (HALCION) 0.25 MG tablet Take 0.5 tablets (0.125 mg total) by mouth at bedtime as needed for sleep. 15 tablet 2   No current facility-administered medications on file prior to visit.     Allergies:  Allergies  Allergen Reactions  . Lipitor [Atorvastatin Calcium] Other (See Comments)    Leg weakness,memory loss  . Lovastatin Other (See Comments)    Leg weakness  . Celebrex [Celecoxib] Itching and Rash  . Latex Rash  . Macrobid [employer] Macro] Other (See Comments)    Unknown per patient  . Other Other (See Comments)    Always pads     Review of Systems:  CONSTITUTIONAL: No fevers, chills, night sweats, or weight loss.   EYES: No visual changes or eye pain ENT: No hearing changes.  No history  of nose bleeds. RESPIRATORY: No cough, wheezing +shortness of breath.   CARDIOVASCULAR: Negative for chest pain, and palpitations.   GI: Negative for abdominal discomfort, blood in stools or black stools.  No recent change in bowel habits.  GU:  No history of incontinence.   MUSCLOSKELETAL: +history of joint pain or swelling.  +myalgias.  + back pain SKIN: Negative for lesions, rash, and itching.   HEMATOLOGY/ONCOLOGY: Negative for prolonged bleeding, bruising easily, and swollen nodes.   ENDOCRINE: Negative for cold or heat intolerance, polydipsia or goiter.   PSYCH:  +depression or anxiety symptoms.   NEURO: As Above.   Vital Signs:  BP 110/70   Ht 5' (1.524 m)   Wt 151 lb (68.5 kg)   BMI 29.49 kg/m   Neurological Exam: MENTAL STATUS including orientation to time, place, person, recent and remote memory is fairly intact. Severely blunted affect.   CRANIAL NERVES:  Pupils round and reactive to light. Extraocular muscles are intact. There is mild flattening of the right nasolabial fold and  her smile is asymmetric. Facial muscles are intact. Palate elevates symmetrically and tongue is midline.  Speech is mildly slurred and stable.  MOTOR:  Motor strength is 5/5 in proximal muscles. Tone is normal throughout.  There is no resting hand tremor.  COORDINATION/GAIT:  Finger tapping is improved and there is no bradykinesia.  Toe tapping is still slowed.  Gait is stooped, steady with walker, with normal cadence.   Data: EMG 01/15/2013:  Taken together, these findings are consistent with a moderate generalized, large fiber, sensorimotor polyneuropathy affecting the left side. There is a superimposed intraspinal canal lesion (i.e. radiculopathy) affecting the L2-L4 nerve root/segments bilaterally, moderate in degree electrically.  MRI/A Brain 07/03/2014: 1. Acute nonhemorrhagic 21 mm white matter infarct of the posterior right frontal lobe. 2. Extensive atrophy and diffuse white matter disease corresponds to advanced chronic microvascular ischemia. 3. Moderate to high-grade stenosis of the infra posterior right M2 branch. 4. Moderate small vessel disease is evident on the MRA. 5. High-grade stenosis in the proximal left P2 segment.  MRI/A brain wo contrast 04/27/2016: 1. Subcentimeter acute/early subacute infarct within the left frontal centrum semiovale. No acute hemorrhage. 2. Stable advanced chronic microvascular ischemic changes and moderate volume loss of the brain. 3. No evidence of large vessel occlusion, or aneurysm of the circle of Willis. Intracranial atherosclerosis with short segments of stenosis most pronounced in the left posterior cerebral artery.  US carotids 04/29/2016:  1-39% ICA stenosis bilaterally TTE 04/29/2016:  EF 55-60%, no thrombus  IMPRESSION/PLAN:   1.  Neurological symptoms of Parkinson's are most likely exacerbated by underlying infection (sinusitis, UTI).  Recommend that she complete her antibiotic course and hopefully symptoms will improve.  Do not recommend  making changes to her parkinson's medication at this time, especially when her clinical exam is stable. Reassurance was provided.  2.  Subacute R corona radiata due to small vessel disease (Jan 2018).  Clinically, with mild right flattening of the nasolabial fold and dysarthria.  - Continue plavix 75mg  daily as monotherapy.  She completed 2.5 months of DUAP.   - Secondary risk factors are well-controlled, no diabetes or hypertension, LDL 57 on pravastatin 20mg .   2.  Vascular parkinsonism, REM behavior disorder  -stable.    - Continue sinemet 25/100 1 tab TID  - If restless sensation does not improve after treatment of underlying infection, consider increasing the bedtime dose of Sinemet to 1.5tab  3. Chronic tension headaches  - Continue nortriptyline  to 50mg .  Avoid increasing this further due to dry mouth.  - Limit tylenol and Excedrin as to avoid developing rebound headaches   4.  Peripheral neuropathy and gait instability   - Continue nortriptyline to 50mg  qhs which helps  Return to clinic in 2 months   The duration of this appointment visit was 25 minutes of face-to-face time with the patient.  Greater than 50% of this time was spent in counseling, explanation of diagnosis, planning of further management, and coordination of care.   Thank you for allowing me to participate in patient's care.  If I can answer any additional questions, I would be pleased to do so.    Sincerely,    Taneka Espiritu K. Posey Pronto, DO

## 2016-07-26 ENCOUNTER — Telehealth: Payer: Self-pay | Admitting: Internal Medicine

## 2016-07-26 NOTE — Telephone Encounter (Signed)
Pt called in and said that her ear is not any better and would like to know if Dr Sharlet Salina would like to call in anything else for her/    [place]

## 2016-07-26 NOTE — Telephone Encounter (Signed)
She can try taking zyrtec over the counter for the ear as well and if it is hurting recommend repeat visit.

## 2016-07-27 NOTE — Telephone Encounter (Signed)
Patient aware.

## 2016-07-30 ENCOUNTER — Encounter: Payer: Self-pay | Admitting: Internal Medicine

## 2016-07-30 ENCOUNTER — Ambulatory Visit (INDEPENDENT_AMBULATORY_CARE_PROVIDER_SITE_OTHER): Payer: Medicare Other | Admitting: Internal Medicine

## 2016-07-30 VITALS — BP 136/68 | HR 98 | Temp 97.6°F | Resp 14 | Ht 60.0 in | Wt 152.0 lb

## 2016-07-30 DIAGNOSIS — E538 Deficiency of other specified B group vitamins: Secondary | ICD-10-CM | POA: Diagnosis not present

## 2016-07-30 DIAGNOSIS — R2681 Unsteadiness on feet: Secondary | ICD-10-CM | POA: Diagnosis not present

## 2016-07-30 DIAGNOSIS — G20 Parkinson's disease: Secondary | ICD-10-CM | POA: Diagnosis not present

## 2016-07-30 DIAGNOSIS — I63511 Cerebral infarction due to unspecified occlusion or stenosis of right middle cerebral artery: Secondary | ICD-10-CM | POA: Diagnosis not present

## 2016-07-30 MED ORDER — CYANOCOBALAMIN 1000 MCG/ML IJ SOLN
1000.0000 ug | Freq: Once | INTRAMUSCULAR | Status: AC
  Administered 2016-07-30: 1000 ug via INTRAMUSCULAR

## 2016-07-30 MED ORDER — ALPRAZOLAM 0.5 MG PO TABS: 0.5000 mg | ORAL_TABLET | Freq: Two times a day (BID) | ORAL | 1 refills | Status: DC

## 2016-07-30 MED ORDER — NITROGLYCERIN 0.4 MG SL SUBL: 0.4000 mg | SUBLINGUAL_TABLET | SUBLINGUAL | 0 refills | Status: AC | PRN

## 2016-07-30 NOTE — Progress Notes (Signed)
   Subjective:    Patient ID: Sarah [name], female    DOB: 1926-09-13, 83 y.o.   MRN: [id number]  HPI The patient is an 83 YO female coming in for weakness. She has taken the doxycycline and prednisone for her sinuses and they are improved. She has talked to her neurologist since our last visit about the weakness and they felt it was deconditioning from recent stasis during allergies and not progression of her parkinson's. She has not fallen but has almost fallen several times during her limited activity. She is not able to do many of her ADLs right now.   Review of Systems  Constitutional: Positive for activity change, appetite change and fatigue. Negative for chills, fever and unexpected weight change.  Respiratory: Negative.   Cardiovascular: Negative.   Gastrointestinal: Negative.   Musculoskeletal: Positive for arthralgias, gait problem and myalgias.  Skin: Negative.   Neurological: Positive for dizziness, weakness and headaches. Negative for light-headedness and numbness.  Psychiatric/Behavioral: Positive for decreased concentration and dysphoric mood.      Objective:   Physical Exam  Constitutional: She is oriented to person, place, and time. She appears well-developed and well-nourished.  Frail appearing  HENT:  Head: Normocephalic and atraumatic.  Eyes: EOM are normal.  Cardiovascular: Normal rate and regular rhythm.   Pulmonary/Chest: Effort normal.  Abdominal: Soft.  Musculoskeletal: She exhibits no edema.  Neurological: She is alert and oriented to person, place, and time. Coordination abnormal.  Uses wheeled walker with assistance, needs help to stand  Skin: Skin is warm and dry.   Vitals:   07/30/16 1126  BP: 136/68  Pulse: 98  Resp: 14  Temp: 97.6 F (36.4 C)  TempSrc: Oral  SpO2: 97%  Weight: 152 lb (68.9 kg)  Height: 5' (1.524 m)      Assessment & Plan:  B12 injection given at visit.

## 2016-07-30 NOTE — Progress Notes (Signed)
Pre visit review using our clinic review tool, if applicable. No additional management support is needed unless otherwise documented below in the visit note. 

## 2016-07-30 NOTE — Patient Instructions (Signed)
We have given you the B12 shot today. We will have home health coming out to your house.

## 2016-08-01 NOTE — Assessment & Plan Note (Signed)
Referral to home health today for deconditioning from recent illness. She is not able to do ADLs and leave house without aide. She does have multiple co-morbidities which predispose her to change in status very quickly.

## 2016-08-01 NOTE — Assessment & Plan Note (Signed)
Order for home health as her gait is very unstable and she is deconditioned.

## 2016-08-10 ENCOUNTER — Other Ambulatory Visit: Payer: Self-pay | Admitting: Internal Medicine

## 2016-08-13 DIAGNOSIS — R269 Unspecified abnormalities of gait and mobility: Secondary | ICD-10-CM | POA: Diagnosis not present

## 2016-08-17 DIAGNOSIS — R269 Unspecified abnormalities of gait and mobility: Secondary | ICD-10-CM | POA: Diagnosis not present

## 2016-08-18 DIAGNOSIS — Z961 Presence of intraocular lens: Secondary | ICD-10-CM | POA: Diagnosis not present

## 2016-08-18 DIAGNOSIS — H35033 Hypertensive retinopathy, bilateral: Secondary | ICD-10-CM | POA: Diagnosis not present

## 2016-08-18 DIAGNOSIS — H353132 Nonexudative age-related macular degeneration, bilateral, intermediate dry stage: Secondary | ICD-10-CM | POA: Diagnosis not present

## 2016-08-18 DIAGNOSIS — H40013 Open angle with borderline findings, low risk, bilateral: Secondary | ICD-10-CM | POA: Diagnosis not present

## 2016-08-25 ENCOUNTER — Telehealth: Payer: Self-pay | Admitting: Neurology

## 2016-08-25 DIAGNOSIS — R269 Unspecified abnormalities of gait and mobility: Secondary | ICD-10-CM | POA: Diagnosis not present

## 2016-08-25 NOTE — Telephone Encounter (Signed)
Caller: Nicolas  Urgent? Yes  Reason for the call: She said she had a question for Dr. Posey Pronto and would like a call back as soon as possible [phone]. Thanks

## 2016-08-25 NOTE — Telephone Encounter (Signed)
Legs are getting worse and she is really weak.  She had to cancel her PT this morning.  She said that [name] will not believe her and says that she feels this way because she is not doing her exercises.  She would like for me to call her back tomorrow after I talk to you.  She leaves at 12:30 tomorrow to go to the beauty shop (if she can make it).  I told her I would try to call her before then.

## 2016-08-26 DIAGNOSIS — L820 Inflamed seborrheic keratosis: Secondary | ICD-10-CM | POA: Diagnosis not present

## 2016-08-26 DIAGNOSIS — L821 Other seborrheic keratosis: Secondary | ICD-10-CM | POA: Diagnosis not present

## 2016-08-26 DIAGNOSIS — L304 Erythema intertrigo: Secondary | ICD-10-CM | POA: Diagnosis not present

## 2016-08-26 DIAGNOSIS — L720 Epidermal cyst: Secondary | ICD-10-CM | POA: Diagnosis not present

## 2016-08-26 NOTE — Telephone Encounter (Signed)
Since she has completed her course of antibiotics and still no better, we can increase her sinemet to 1.5 tablets three times daily and see if this is due to worsening Parkinson's disease.  Haniyah Maciolek K. Posey Pronto, DO

## 2016-08-26 NOTE — Telephone Encounter (Signed)
Sarah [name] and Mrs. Mallis both have been given the directions.  Sarah [name] said that she just picked up a new prescription but will call when they need more.

## 2016-08-27 DIAGNOSIS — R269 Unspecified abnormalities of gait and mobility: Secondary | ICD-10-CM | POA: Diagnosis not present

## 2016-08-30 ENCOUNTER — Other Ambulatory Visit: Payer: Self-pay | Admitting: Internal Medicine

## 2016-09-01 DIAGNOSIS — R269 Unspecified abnormalities of gait and mobility: Secondary | ICD-10-CM | POA: Diagnosis not present

## 2016-09-03 DIAGNOSIS — R269 Unspecified abnormalities of gait and mobility: Secondary | ICD-10-CM | POA: Diagnosis not present

## 2016-09-13 ENCOUNTER — Telehealth: Payer: Self-pay | Admitting: Neurology

## 2016-09-13 NOTE — Telephone Encounter (Signed)
Patient instructed to back down the nortriptyline by 10 mg and to keep Friday appointment per Dr. Posey Pronto.

## 2016-09-13 NOTE — Telephone Encounter (Signed)
PT called and said she is feeling really bad since upping the dosage of Nortriptyline

## 2016-09-17 ENCOUNTER — Ambulatory Visit (INDEPENDENT_AMBULATORY_CARE_PROVIDER_SITE_OTHER): Payer: Medicare Other | Admitting: Neurology

## 2016-09-17 ENCOUNTER — Encounter: Payer: Self-pay | Admitting: Neurology

## 2016-09-17 VITALS — BP 130/64 | HR 95 | Ht 60.0 in | Wt 155.0 lb

## 2016-09-17 DIAGNOSIS — G214 Vascular parkinsonism: Secondary | ICD-10-CM | POA: Diagnosis not present

## 2016-09-17 DIAGNOSIS — I639 Cerebral infarction, unspecified: Secondary | ICD-10-CM | POA: Diagnosis not present

## 2016-09-17 DIAGNOSIS — E538 Deficiency of other specified B group vitamins: Secondary | ICD-10-CM | POA: Diagnosis not present

## 2016-09-17 DIAGNOSIS — F329 Major depressive disorder, single episode, unspecified: Secondary | ICD-10-CM

## 2016-09-17 DIAGNOSIS — R5381 Other malaise: Secondary | ICD-10-CM

## 2016-09-17 DIAGNOSIS — I63511 Cerebral infarction due to unspecified occlusion or stenosis of right middle cerebral artery: Secondary | ICD-10-CM

## 2016-09-17 DIAGNOSIS — R4589 Other symptoms and signs involving emotional state: Secondary | ICD-10-CM

## 2016-09-17 MED ORDER — CYANOCOBALAMIN 1000 MCG/ML IJ SOLN
1000.0000 ug | Freq: Once | INTRAMUSCULAR | Status: AC
  Administered 2016-09-17: 1000 ug via INTRAMUSCULAR

## 2016-09-17 NOTE — Addendum Note (Signed)
Addended by: Chester Holstein on: 09/17/2016 12:29 PM   Modules accepted: Orders

## 2016-09-17 NOTE — Patient Instructions (Addendum)
Please call the office if you would like to talk to a Education officer, museum or therapist for your mood  Provide your Advance Directives to your providers  Return to clinic in 2-3 months

## 2016-09-17 NOTE — Progress Notes (Signed)
[facility]  Follow-up Visit   Date: 09/17/16   Sarah [name] MRN: [id number] DOB: March 29, 1927   Interim History: Sarah [name] is a 83 y.o. right-handed Caucasian female with history of HTN, HPL, depression, esophageal strictures, right corona radiata stroke ( residual right facial droop, 2018), vascular parkinson's disease, and GERD returning to the clinic for follow-up of Parkinson's disease and gait instability.  The patient was accompanied to the clinic by her caregiver, [name].  History of present illness: Leg weakness started slowly around May 2014 when she noticed difficulty with walking and tripping on things. She would stumble frequently, no falls.  She has associated bilateral achy knee pain and chronic back pain.  Forward flexion makes her pain worse and it is improved by laying down. EMG was performed and shows moderate peripheral neuropathy of the legs and L3-L4 old radiculopathy.   Caregiver says that she is more forgetful than usual, especially with conversation.    In April 2016, she was hospitalization for acute stroke manifested with slurred speech which has since resolved.  She was started on Plavix 75 mg for this.  During the fall of 2016, she started experiencing bifrontal throbbing headaches. She was give tizanidine, but did not tolerate this.  Neck PT helped. Her headaches are much improved since she started taking ginger supplements.   In June 2017 she was diagnosed with parkinsonism she noticed that her movements and become slower and she is able to do less work. She notices a marked difference in her walking when she is taking sinemet 1 tab TID.  UPDATE 05/25/2016:  Patient developed acute onset of dysarthria, right facial droop while at her PCPs office and was transferred to the emergency department for evaluation. She was found to have a small left frontal corona radiata stroke secondary to small vessel disease. IV TPA was not  administered due to mild symptoms. Echo was normal, no thrombus.  US carotids showed no significant stenosis.  She was taking plavix 75mg  and with her new event, aspirin was added.  DAPT for 3 months recommended, then plavix alone.  She was transferred to skilled nursing facility from 2/2-2/15/2018 and then discharged home where she continues to get home PT and speech therapy. She declined home occupational therapy last week due to being overwhelmed with people in her home. She feels that her speech and facial droop are slowly improving. She denies any difficulty with swallowing and is mostly having soft foods. She denies any new weakness.  Her secondary major complaint is new onset of bifrontal throbbing headaches. She was given a prescription for topiramate in the hospital, but did not take this. She also complains of cold painful feet, especially at bedtime.  UPDATE 3/23/20018:  Since increasing nortriptyline 50mg  her headaches and neuropathy have improved.  She has not had a headache in the past three days. She had noticed worsening dry mouth, but it is not bothersome enough to make changes to her medication. She continues to have slurred speech and has had two session of speech therapy.  She continues to feel deconditioned and weak and reports only having four session of PT at home and is very interested in doing more.  [name] states that she is not complaint with her home exercises.  Gait is stable, assisted with walker and no falls.  UPDATE 07/20/2016:  Patient requested for a sooner follow-up due to increased shaking of the legs. About 2 weeks ago, she reports having increased shakiness of the  legs only when she lays prone in the bed. Symptoms are not present when she is sitting or with activity. She also describes generalized malaise, increased pressure of the head, and hoarse voice. She recently saw her primary care doctor who prescribed Augmentin for sinusitis.  Her urinalysis also suggestive of probable  UTI.  She has been more sedentary and noncompliant with home exercises.  UPDATE 09/17/2016:  She was seen by her PCP in May and noted to have generalized deconditioning for which home health was ordered. Ms. Fill called in May because of worsening restless sensation despite being treated for sinusitis and UTI, so I recommended increasing her sinemet to 1.5 tab TID, however since doing this she had increased weakness and being "unwell" went back down to 1 tab TID.  She continues to feel weak and has difficulty walking, and fortunately, has not suffered any falls.  Mood has been down and she is more worried about dying. She get social support through her faith and has a Company secretary who comes to visit her.     Medications:  Current Outpatient Prescriptions on File Prior to Visit  Medication Sig Dispense Refill  . acetaminophen (TYLENOL) 500 MG tablet Take 1 tablet (500 mg total) by mouth every 8 (eight) hours as needed (for pain). 180 tablet 2  . ALPRAZolam (XANAX) 0.5 MG tablet Take 1 tablet (0.5 mg total) by mouth 2 (two) times daily. 60 tablet 1  . aspirin 325 MG EC tablet Take 1 tablet (325 mg total) by mouth daily. 30 tablet 0  . carbidopa-levodopa (SINEMET IR) 25-100 MG tablet Take 1 tablet by mouth 3 (three) times daily. 270 tablet 3  . Cholecalciferol (VITAMIN D PO) Take 1 tablet by mouth daily.     . clopidogrel (PLAVIX) 75 MG tablet Take 1 tablet (75 mg total) by mouth daily. 90 tablet 2  . diclofenac sodium (VOLTAREN) 1 % GEL Apply  To feet 3 times daily prn for pain 1 Tube 5  . HALCION 0.25 MG tablet TAKE 1/2 TABLET AT BEDTIME AS NEEDED FOR SLEEP. 15 tablet 2  . ipratropium (ATROVENT) 0.06 % nasal spray Place 2 sprays into both nostrils 4 (four) times daily. 15 mL 12  . nitroGLYCERIN (NITROSTAT) 0.4 MG SL tablet Place 1 tablet (0.4 mg total) under the tongue every 5 (five) minutes as needed for chest pain. 5 tablet 0  . nortriptyline (PAMELOR) 50 MG capsule Take 1 capsule (50 mg total) by  mouth at bedtime. 30 capsule 11  . ofloxacin (OCUFLOX) 0.3 % ophthalmic solution   0  . pantoprazole (PROTONIX) 40 MG tablet Take 1 tablet (40 mg total) by mouth daily. 30 tablet 11  . perphenazine (TRILAFON) 2 MG tablet TAKE ONE TABLET AT BEDTIME. 30 tablet 3  . polyethylene glycol (MIRALAX / GLYCOLAX) packet Take 17 g by mouth daily. 14 each 0  . pravastatin (PRAVACHOL) 20 MG tablet TAKE (1/2) TABLET DAILY. 45 tablet 3  . prednisoLONE acetate (PRED FORTE) 1 % ophthalmic suspension Place 1 drop into the right eye 2 (two) times daily. 5 mL 0  . solifenacin (VESICARE) 10 MG tablet TAKE (1) TABLET DAILY AS NEEDED FOR BLADDER 30 tablet 0   No current facility-administered medications on file prior to visit.     Allergies:  Allergies  Allergen Reactions  . Lipitor [Atorvastatin Calcium] Other (See Comments)    Leg weakness,memory loss  . Lovastatin Other (See Comments)    Leg weakness  . Celebrex [Celecoxib] Itching and Rash  .  Latex Rash  . Macrobid [employer] Macro] Other (See Comments)    Unknown per patient  . Other Other (See Comments)    Always pads     Review of Systems:  CONSTITUTIONAL: No fevers, chills, night sweats, or weight loss.   EYES: No visual changes or eye pain ENT: No hearing changes.  No history of nose bleeds. RESPIRATORY: No cough, wheezing +shortness of breath.   CARDIOVASCULAR: Negative for chest pain, and palpitations.   GI: Negative for abdominal discomfort, blood in stools or black stools.  No recent change in bowel habits.  GU:  No history of incontinence.   MUSCLOSKELETAL: +history of joint pain or swelling.  +myalgias.  + back pain SKIN: Negative for lesions, rash, and itching.   HEMATOLOGY/ONCOLOGY: Negative for prolonged bleeding, bruising easily, and swollen nodes.   ENDOCRINE: Negative for cold or heat intolerance, polydipsia or goiter.   PSYCH:  +depression or anxiety symptoms.   NEURO: As Above.   Vital Signs:  BP 130/64    Pulse 95   Ht 5' (1.524 m)   Wt 155 lb (70.3 kg)   SpO2 96%   BMI 30.27 kg/m   Neurological Exam: MENTAL STATUS including orientation to time, place, person, recent and remote memory is fairly intact. She tends to forget details in mid-conversation and has word-finding problems.  Speech is mildly dysarthric, stable. Severely blunted affect.   CRANIAL NERVES:  Pupils round and reactive to light. Extraocular muscles are intact. There is mild flattening of the right nasolabial fold and her smile is asymmetric.   MOTOR:  Motor strength is 5/5 in proximal muscles. Tone is normal throughout. No resting hand tremor.  COORDINATION/GAIT:  Finger tapping is improved.  Toe tapping is still slowed.  Gait is stooped, slow and steady with walker, with normal cadence.   Data: EMG 01/15/2013:  Taken together, these findings are consistent with a moderate generalized, large fiber, sensorimotor polyneuropathy affecting the left side. There is a superimposed intraspinal canal lesion (i.e. radiculopathy) affecting the L2-L4 nerve root/segments bilaterally, moderate in degree electrically.  MRI/A Brain 07/03/2014: 1. Acute nonhemorrhagic 21 mm white matter infarct of the posterior right frontal lobe. 2. Extensive atrophy and diffuse white matter disease corresponds to advanced chronic microvascular ischemia. 3. Moderate to high-grade stenosis of the infra posterior right M2 branch. 4. Moderate small vessel disease is evident on the MRA. 5. High-grade stenosis in the proximal left P2 segment.  MRI/A brain wo contrast 04/27/2016: 1. Subcentimeter acute/early subacute infarct within the left frontal centrum semiovale. No acute hemorrhage. 2. Stable advanced chronic microvascular ischemic changes and moderate volume loss of the brain. 3. No evidence of large vessel occlusion, or aneurysm of the circle of Willis. Intracranial atherosclerosis with short segments of stenosis most pronounced in the left posterior  cerebral artery.  US carotids 04/29/2016:  1-39% ICA stenosis bilaterally TTE 04/29/2016:  EF 55-60%, no thrombus  IMPRESSION/PLAN:   1.  Generalized deconditioning to due multiple comorbidities and advanced age.  Since her stroke in February, she unfortunately, has not recovered to her prior baseline and has required 24 hour caregiver support.  I attempted optimizing her parkinson's medication, but she did not tolerate this not appreciate any improvement. She has completed home PT.  Given that she is on TCA for headaches, I will check EKG to assess her QT interval.   2.  Depressed mood and anxiety over death and dying.  She does not wish to talk to a Education officer, museum or therapist  and will coordinate visits with her minister.  Defer pharmacological therapy to her PCP for depression. She has Advanced Directives in place and is DNR.    3.  Subacute R corona radiata due to small vessel disease (Jan 2018). mild right flattening of the nasolabial fold and dysarthria.  - Continue plavix 75mg  daily as monotherapy.    - Secondary risk factors are well-controlled, no diabetes or hypertension, LDL 57 on pravastatin 20mg .   4.  Vascular parkinsonism, REM behavior disorder, Lewy body dementia  -stable.    - Continue sinemet 25/100 1 tab TID  - She is having memory loss and has mild visual hallucinations, but they are not bothersome  5. Chronic tension headaches - stable  - Continue nortriptyline to 50mg .   Will check EKG to assess QT interval  - Limit tylenol and Excedrin as to avoid developing rebound headaches   6.  Peripheral neuropathy and gait instability   - Continue nortriptyline to 50mg  qhs which helps  - Recommend using a wheelchair as needed, if she is too unsteady with a walker  7.  Vitamin B12 deficiency  - She usually gets injections with her PCP's office, but would like to have today.  B12 given.  Return to clinic in 2-3 months   The duration of this appointment visit was 40 minutes of  face-to-face time with the patient.  Greater than 50% of this time was spent in counseling, explanation of diagnosis, planning of further management, and coordination of care.   Thank you for allowing me to participate in patient's care.  If I can answer any additional questions, I would be pleased to do so.    Sincerely,    Abimael Zeiter K. Posey Pronto, DO

## 2016-09-22 ENCOUNTER — Telehealth: Payer: Self-pay | Admitting: Neurology

## 2016-09-22 NOTE — Telephone Encounter (Signed)
Patient states that the neuropathy of the rt heel has kept her up the last three nights please call her she wants to know if there is something she can do

## 2016-09-22 NOTE — Telephone Encounter (Signed)
I spoke with patient and suggested that she try some over the counter salonpas to see if this will help her pain.  I also spoke with Nira Conn (caregiver) and she will go pick some up at the pharmacy.

## 2016-09-25 ENCOUNTER — Emergency Department (HOSPITAL_COMMUNITY): Payer: Medicare Other

## 2016-09-25 ENCOUNTER — Emergency Department (HOSPITAL_COMMUNITY)
Admission: EM | Admit: 2016-09-25 | Discharge: 2016-09-25 | Disposition: A | Discharge status: Home / Self Care | Payer: Medicare Other | Attending: Emergency Medicine | Admitting: Emergency Medicine

## 2016-09-25 DIAGNOSIS — Y929 Unspecified place or not applicable: Secondary | ICD-10-CM | POA: Insufficient documentation

## 2016-09-25 DIAGNOSIS — S4991XA Unspecified injury of right shoulder and upper arm, initial encounter: Secondary | ICD-10-CM | POA: Diagnosis not present

## 2016-09-25 DIAGNOSIS — Z8673 Personal history of transient ischemic attack (TIA), and cerebral infarction without residual deficits: Secondary | ICD-10-CM | POA: Diagnosis not present

## 2016-09-25 DIAGNOSIS — Z79899 Other long term (current) drug therapy: Secondary | ICD-10-CM | POA: Diagnosis not present

## 2016-09-25 DIAGNOSIS — S0990XA Unspecified injury of head, initial encounter: Secondary | ICD-10-CM | POA: Diagnosis not present

## 2016-09-25 DIAGNOSIS — S199XXA Unspecified injury of neck, initial encounter: Secondary | ICD-10-CM | POA: Diagnosis not present

## 2016-09-25 DIAGNOSIS — Y939 Activity, unspecified: Secondary | ICD-10-CM | POA: Insufficient documentation

## 2016-09-25 DIAGNOSIS — Z7982 Long term (current) use of aspirin: Secondary | ICD-10-CM | POA: Insufficient documentation

## 2016-09-25 DIAGNOSIS — I10 Essential (primary) hypertension: Secondary | ICD-10-CM | POA: Insufficient documentation

## 2016-09-25 DIAGNOSIS — W19XXXA Unspecified fall, initial encounter: Secondary | ICD-10-CM | POA: Diagnosis not present

## 2016-09-25 DIAGNOSIS — T148XXA Other injury of unspecified body region, initial encounter: Secondary | ICD-10-CM | POA: Diagnosis not present

## 2016-09-25 DIAGNOSIS — Z91040 Latex allergy status: Secondary | ICD-10-CM | POA: Insufficient documentation

## 2016-09-25 DIAGNOSIS — M25511 Pain in right shoulder: Secondary | ICD-10-CM | POA: Diagnosis not present

## 2016-09-25 DIAGNOSIS — S40011A Contusion of right shoulder, initial encounter: Secondary | ICD-10-CM | POA: Insufficient documentation

## 2016-09-25 DIAGNOSIS — M542 Cervicalgia: Secondary | ICD-10-CM | POA: Diagnosis not present

## 2016-09-25 DIAGNOSIS — E785 Hyperlipidemia, unspecified: Secondary | ICD-10-CM | POA: Insufficient documentation

## 2016-09-25 DIAGNOSIS — R51 Headache: Secondary | ICD-10-CM | POA: Diagnosis not present

## 2016-09-25 DIAGNOSIS — Y998 Other external cause status: Secondary | ICD-10-CM | POA: Insufficient documentation

## 2016-09-25 MED ORDER — TRAMADOL HCL 50 MG PO TABS: 50.0000 mg | ORAL_TABLET | Freq: Four times a day (QID) | ORAL | 0 refills | Status: DC | PRN

## 2016-09-25 MED ORDER — HYDROCODONE-ACETAMINOPHEN 5-325 MG PO TABS
1.0000 | ORAL_TABLET | Freq: Once | ORAL | Status: AC
  Administered 2016-09-25: 1 via ORAL
  Filled 2016-09-25: qty 1

## 2016-09-25 NOTE — ED Provider Notes (Signed)
[facility] DEPT Provider Note   CSN: [id number] Arrival date & time: 09/25/16  1206  By signing my name below, I, Sarah [name], attest that this documentation has been prepared under the direction and in the presence of Sarah Manifold, MD. Electronically Signed: Sonum [name], Education administrator. 09/25/16. 1:22 PM.  History   Chief Complaint Chief Complaint  Patient presents with  . Fall    The history is provided by the patient and a caregiver. No language interpreter was used.     HPI Comments: Sarah [name] is a 83 y.o. female brought in by ambulance, who presents to the Emergency Department complaining of a fall that occurred this morning. She states she was standing with her walker when she fell onto her right shoulder. She notes striking her head on the carpet but denies LOC. She does not recall why she fell but denies tripping or dizziness prior to the fall. She currently complains of a constant, unchanged HA, neck pain, upper back pain, right leg pain, right shoulder pain. She was able to ambulate to the stretcher.She currently takes Plavix. She denies vision changes, nausea, numbness or tingling. She has a history of CVA but denies deficits.   Past Medical History:  Diagnosis Date  . Bladder incontinence   . Chest pain    S/P nuclear 07/17/2008  normal; EF 88% with no ischemia  . Chronic venous insufficiency    BLE edema, chronic, neg venous US 2/13  . Constipation    hx of  . Depression   . Diverticulitis   . Esophageal stricture    s/p dilation 04/2012  . GERD (gastroesophageal reflux disease)   . History of chicken pox   . Hyperlipidemia   . Hypertension   . Osteoarthritis   . Osteoporosis, postmenopausal 06/02/2011  . Polyp of colon   . Stroke [facility]) 07/03/2014    Patient Active Problem List   Diagnosis Date Noted  . Acute sinusitis 07/16/2016  . Headache 07/01/2016  . Generalized anxiety disorder   . Vascular headache   . Slow transit constipation   . Small vessel  disease, cerebrovascular 04/30/2016  . Dysphagia, post-stroke   . History of cerebrovascular accident (CVA) with residual deficit   . Hypertension 04/27/2016  . Dysarthria 04/27/2016  . Primary Parkinsonism ([place]) 09/26/2015  . Peripheral edema 05/12/2015  . DOE (dyspnea on exertion) 02/12/2015  . Neck pain 01/10/2015  . Routine general medical examination at a health care facility 09/17/2014  . Hyperglycemia 07/09/2014  . Degenerative arthritis of knee, bilateral 03/01/2014  . Hyponatremia 10/05/2013  . Memory deficits 10/05/2013  . Arthritis of sacroiliac joint ([place]) 09/11/2013  . Lumbosacral spondylosis without myelopathy 02/26/2013  . Unsteady gait 12/15/2012  . Right shoulder pain   . Vitamin B12 deficiency 04/13/2012  . Hypercholesterolemia 11/05/2011  . Osteoporosis, postmenopausal 06/02/2011  . Mixed incontinence urge and stress 06/02/2011  . Edema 04/06/2011  . Tachycardia 04/06/2011    Past Surgical History:  Procedure Laterality Date  . APPENDECTOMY     age 13  . COLONOSCOPY    . POLYPECTOMY    . TUBAL LIGATION    . UPPER GASTROINTESTINAL ENDOSCOPY      OB History    No data available       Home Medications    Prior to Admission medications   Medication Sig Start Date End Date Taking? Authorizing Provider  acetaminophen (TYLENOL) 500 MG tablet Take 1 tablet (500 mg total) by mouth every 8 (eight) hours as needed (for  pain). 08/09/14   Darlin Coco, MD  ALPRAZolam Duanne Moron) 0.5 MG tablet Take 1 tablet (0.5 mg total) by mouth 2 (two) times daily. 07/30/16   Hoyt Koch, MD  aspirin 325 MG EC tablet Take 1 tablet (325 mg total) by mouth daily. 05/01/16   Arrien, Jimmy Picket, MD  carbidopa-levodopa (SINEMET IR) 25-100 MG tablet Take 1 tablet by mouth 3 (three) times daily. 06/28/16   Narda Amber K, DO  Cholecalciferol (VITAMIN D PO) Take 1 tablet by mouth daily.     [provider]  clopidogrel (PLAVIX) 75 MG tablet Take 1 tablet (75 mg  total) by mouth daily. 05/13/16   Angiulli, Lavon Paganini, PA-C  diclofenac sodium (VOLTAREN) 1 % GEL Apply  To feet 3 times daily prn for pain 05/13/16   Angiulli, Lavon Paganini, PA-C  HALCION 0.25 MG tablet TAKE 1/2 TABLET AT BEDTIME AS NEEDED FOR SLEEP. 08/30/16   Hoyt Koch, MD  ipratropium (ATROVENT) 0.06 % nasal spray Place 2 sprays into both nostrils 4 (four) times daily. 07/01/16   Hoyt Koch, MD  nitroGLYCERIN (NITROSTAT) 0.4 MG SL tablet Place 1 tablet (0.4 mg total) under the tongue every 5 (five) minutes as needed for chest pain. 07/30/16   Hoyt Koch, MD  nortriptyline (PAMELOR) 50 MG capsule Take 1 capsule (50 mg total) by mouth at bedtime. 05/25/16   Narda Amber K, DO  ofloxacin (OCUFLOX) 0.3 % ophthalmic solution  05/13/16   [provider]  pantoprazole (PROTONIX) 40 MG tablet Take 1 tablet (40 mg total) by mouth daily. 05/13/16   Angiulli, Lavon Paganini, PA-C  perphenazine (TRILAFON) 2 MG tablet TAKE ONE TABLET AT BEDTIME. 07/09/16   Hoyt Koch, MD  polyethylene glycol [facility]ing) packet Take 17 g by mouth daily. 05/13/16   Angiulli, Lavon Paganini, PA-C  pravastatin (PRAVACHOL) 20 MG tablet TAKE (1/2) TABLET DAILY. 05/13/16   Angiulli, Lavon Paganini, PA-C  prednisoLONE acetate (PRED FORTE) 1 % ophthalmic suspension Place 1 drop into the right eye 2 (two) times daily. 04/30/16   Arrien, Jimmy Picket, MD  solifenacin (VESICARE) 10 MG tablet TAKE (1) TABLET DAILY AS NEEDED FOR BLADDER 04/30/16   Arrien, Jimmy Picket, MD    Family History Family History  Problem Relation Age of Onset  . Cancer Son        testicular cancer  . Breast cancer Sister   . Skin cancer Sister   . Colon cancer Neg Hx   . Esophageal cancer Neg Hx   . Rectal cancer Neg Hx   . Stomach cancer Neg Hx     Social History Social History  Substance Use Topics  . Smoking status: Never Smoker  . Smokeless tobacco: Never Used  . Alcohol use No     Allergies   Lipitor  [atorvastatin calcium]; Lovastatin; Celebrex [celecoxib]; Latex; Macrobid [nitrofurantoin monohyd macro]; and Other   Review of Systems Review of Systems  All other systems reviewed and are negative for acute change except as noted in the HPI.   Physical Exam Updated Vital Signs BP (!) 173/76 (BP Location: Left Arm)   Pulse 85   Temp 97.9 F (36.6 C) (Oral)   Resp 16   SpO2 96%   Physical Exam  Constitutional: She is oriented to person, place, and time. She appears well-developed and well-nourished. No distress.  HENT:  Head: Normocephalic and atraumatic.  Eyes: EOM are normal. Pupils are equal, round, and reactive to light.  Neck: Normal range  of motion. Neck supple. No tracheal deviation present.  Cardiovascular: Normal rate, regular rhythm and normal heart sounds.   Pulmonary/Chest: Effort normal and breath sounds normal. No respiratory distress. She has no wheezes. She has no rales.  Abdominal: Soft. She exhibits no distension. There is no tenderness.  Musculoskeletal: She exhibits tenderness. She exhibits no deformity.  Midline tenderness around the level of C7.  Mild tenderness anteriorly to right shoulder. Mild pain with ROM.  NVI.  Bruising to right shin without significant bony tenderness.   Neurological: She is alert and oriented to person, place, and time.  Speech mildly dysarthric   Skin: Skin is warm and dry. Bruising noted.  Psychiatric: She has a normal mood and affect. Judgment normal.  Nursing note and vitals reviewed.    ED Treatments / Results  DIAGNOSTIC STUDIES: Oxygen Saturation is 96% on RA, adequate by my interpretation.    COORDINATION OF CARE: 1:13 PM Discussed treatment plan with pt at bedside and pt agreed to plan.   Labs (all labs ordered are listed, but only abnormal results are displayed) Labs Reviewed - No data to display  EKG  EKG Interpretation None       Radiology No results found.   Dg Shoulder Right  Result Date:  09/25/2016 CLINICAL DATA:  [place] out of bit head this morning with shoulder pain. EXAM: RIGHT SHOULDER - 2+ VIEW COMPARISON:  None. FINDINGS: There is no evidence of fracture or dislocation. There is no evidence of arthropathy or other focal bone abnormality. Soft tissues are unremarkable. IMPRESSION: Negative. Electronically Signed   By: Nelson Chimes M.D.   On: 09/25/2016 13:54   Ct Head Wo Contrast  Result Date: 09/25/2016 CLINICAL DATA:  83 y/o female status post fall. Fell this morning landing on right side. Head and neck pain. On blood thinners. EXAM: CT HEAD WITHOUT CONTRAST CT CERVICAL SPINE WITHOUT CONTRAST TECHNIQUE: Multidetector CT imaging of the head and cervical spine was performed following the standard protocol without intravenous contrast. Multiplanar CT image reconstructions of the cervical spine were also generated. COMPARISON:  Brain MRI 04/27/2016.  Head CT 04/27/2016 and earlier. FINDINGS: CT HEAD FINDINGS Brain: Patchy and confluent bilateral cerebral white matter hypodensity extending into the deep white matter capsules. Focal chronic lacune of the left corona radiata. No midline shift, ventriculomegaly, intracranial hemorrhage or evidence of cortically based acute infarction. No cortical encephalomalacia identified. Coarsely calcified extra-axial mass arising from the right anterior clinoid process measures 15 mm diameter (series 5, image 27) and is chronic compatible with meningioma. This has not significantly changed since 2016. No other intracranial mass lesion or mass effect. Vascular: Calcified atherosclerosis at the skull base. Skull: Stable and intact. Sinuses/Orbits: Mild mucosal thickening now on the right maxillary sinus. Other visible paranasal sinuses and mastoids are stable and well pneumatized. Other: No scalp hematoma.  Stable orbits soft tissues. CT CERVICAL SPINE FINDINGS Alignment: Straightening of cervical lordosis. Mild degenerative appearing anterolisthesis at C3-C4  and C7-T1. Bilateral posterior element alignment is within normal limits. Skull base and vertebrae: Visualized skull base is intact. No atlanto-occipital dissociation. No acute cervical spine fracture. Soft tissues and spinal canal: Negative for age noncontrast neck soft tissues. Disc levels: Advanced chronic disc and endplate degeneration D5-Z2 through C7-T1. Associated left greater than right neural foraminal stenosis at those levels. Upper chest: Visible upper thoracic levels appear intact. Negative lung apices. IMPRESSION: 1. No acute intracranial abnormality. No acute fracture in the cervical spine. No acute traumatic injury identified. 2. Advanced chronic  small vessel disease. 3. Chronic benign 15 mm meningioma suspected at the right anterior clinoid process. Electronically Signed   By: Genevie Ann M.D.   On: 09/25/2016 14:20   Ct Cervical Spine Wo Contrast  Result Date: 09/25/2016 CLINICAL DATA:  83 y/o female status post fall. Fell this morning landing on right side. Head and neck pain. On blood thinners. EXAM: CT HEAD WITHOUT CONTRAST CT CERVICAL SPINE WITHOUT CONTRAST TECHNIQUE: Multidetector CT imaging of the head and cervical spine was performed following the standard protocol without intravenous contrast. Multiplanar CT image reconstructions of the cervical spine were also generated. COMPARISON:  Brain MRI 04/27/2016.  Head CT 04/27/2016 and earlier. FINDINGS: CT HEAD FINDINGS Brain: Patchy and confluent bilateral cerebral white matter hypodensity extending into the deep white matter capsules. Focal chronic lacune of the left corona radiata. No midline shift, ventriculomegaly, intracranial hemorrhage or evidence of cortically based acute infarction. No cortical encephalomalacia identified. Coarsely calcified extra-axial mass arising from the right anterior clinoid process measures 15 mm diameter (series 5, image 27) and is chronic compatible with meningioma. This has not significantly changed since  2016. No other intracranial mass lesion or mass effect. Vascular: Calcified atherosclerosis at the skull base. Skull: Stable and intact. Sinuses/Orbits: Mild mucosal thickening now on the right maxillary sinus. Other visible paranasal sinuses and mastoids are stable and well pneumatized. Other: No scalp hematoma.  Stable orbits soft tissues. CT CERVICAL SPINE FINDINGS Alignment: Straightening of cervical lordosis. Mild degenerative appearing anterolisthesis at C3-C4 and C7-T1. Bilateral posterior element alignment is within normal limits. Skull base and vertebrae: Visualized skull base is intact. No atlanto-occipital dissociation. No acute cervical spine fracture. Soft tissues and spinal canal: Negative for age noncontrast neck soft tissues. Disc levels: Advanced chronic disc and endplate degeneration W2-B7 through C7-T1. Associated left greater than right neural foraminal stenosis at those levels. Upper chest: Visible upper thoracic levels appear intact. Negative lung apices. IMPRESSION: 1. No acute intracranial abnormality. No acute fracture in the cervical spine. No acute traumatic injury identified. 2. Advanced chronic small vessel disease. 3. Chronic benign 15 mm meningioma suspected at the right anterior clinoid process. Electronically Signed   By: Genevie Ann M.D.   On: 09/25/2016 14:20   Procedures Procedures (including critical care time)  Medications Ordered in ED Medications - No data to display   Initial Impression / Assessment and Plan / ED Course  I have reviewed the triage vital signs and the nursing notes.  Pertinent labs & imaging results that were available during my care of the patient were reviewed by me and considered in my medical decision making (see chart for details).     Negative imaging. Baseline mental status.   Final Clinical Impressions(s) / ED Diagnoses   Final diagnoses:  Fall, initial encounter  Contusion of right shoulder, initial encounter    New  Prescriptions New Prescriptions   No medications on file   I personally preformed the services scribed in my presence. The recorded information has been reviewed is accurate. Sarah Manifold, MD.    Sarah Manifold, MD 10/03/16 [phone]

## 2016-09-25 NOTE — ED Triage Notes (Addendum)
Pt arrived via [facility] after falling this morning. Pt lives alone but was able to call her caretaker for assistance. Pt does not remember falling. Pt is on blood thinners and c/o of right sided pain in her head, shoulder, neck, arm, and leg. Pt has no obvious deformities and arrived with towel-wrap on neck for stabilization, per EMS. Pt c/o mild headache. Pt is alert and oriented x4. Caretaker at bedside. Family en route from [place], [place]. Pt has hx of CVA but denies deficit.

## 2016-09-30 ENCOUNTER — Other Ambulatory Visit: Payer: Self-pay | Admitting: Internal Medicine

## 2016-09-30 NOTE — Telephone Encounter (Signed)
Faxed script back to [place].../lmb 

## 2016-09-30 NOTE — Telephone Encounter (Signed)
Per [place] registry last filled 08/30/2016 @ [place].Marland Kitchen/LMB

## 2016-09-30 NOTE — Telephone Encounter (Signed)
Per [place] registry last filled

## 2016-10-01 ENCOUNTER — Ambulatory Visit: Payer: Medicare Other | Admitting: Physician Assistant

## 2016-10-01 NOTE — Telephone Encounter (Signed)
rx faxed to [facility] 

## 2016-10-01 NOTE — Telephone Encounter (Signed)
Will refill - seen in ED recently.  If pain persists and she feels she needs a refill she should be seen.  [place] controlled substance database checked.  Ok to fill medication. rx printed.

## 2016-10-01 NOTE — Telephone Encounter (Signed)
Routing to dr burns, please advise, thanks 

## 2016-10-04 ENCOUNTER — Ambulatory Visit (INDEPENDENT_AMBULATORY_CARE_PROVIDER_SITE_OTHER): Payer: Medicare Other | Admitting: Family Medicine

## 2016-10-04 ENCOUNTER — Encounter: Payer: Self-pay | Admitting: Family Medicine

## 2016-10-04 VITALS — BP 150/72 | HR 89 | Temp 98.5°F | Resp 14 | Ht 60.0 in | Wt 156.0 lb

## 2016-10-04 DIAGNOSIS — I10 Essential (primary) hypertension: Secondary | ICD-10-CM

## 2016-10-04 DIAGNOSIS — G20 Parkinson's disease: Secondary | ICD-10-CM | POA: Diagnosis not present

## 2016-10-04 DIAGNOSIS — I63511 Cerebral infarction due to unspecified occlusion or stenosis of right middle cerebral artery: Secondary | ICD-10-CM | POA: Diagnosis not present

## 2016-10-04 DIAGNOSIS — Z7189 Other specified counseling: Secondary | ICD-10-CM

## 2016-10-04 NOTE — Progress Notes (Signed)
Subjective:    Patient ID: Sarah [name], female    DOB: 04-01-1926, 83 y.o.   MRN: [id number]  HPI This is an 83 yo female, accompanied by her caregiver, who presents today for ED follow up. She was seen 09/25/16 following a fall onto her right shoulder and head while using walker. She fell onto carpeted area. CT of head and cervical spine and right shoulder xray were negative for acute processes. She reports bruising of right forearm, right shin.   Caregiver requests alprazolam prescription. It was filled 4 days ago without refills.   Patient requests DNR sign for her home in the event EMS is called. Discussed desires to not have chest compression or artificial breathing with patient who states it is her desire to not be resuscitated.   Past Medical History:  Diagnosis Date  . Bladder incontinence   . Chest pain    S/P nuclear 07/17/2008  normal; EF 88% with no ischemia  . Chronic venous insufficiency    BLE edema, chronic, neg venous US 2/13  . Constipation    hx of  . Depression   . Diverticulitis   . Esophageal stricture    s/p dilation 04/2012  . GERD (gastroesophageal reflux disease)   . History of chicken pox   . Hyperlipidemia   . Hypertension   . Osteoarthritis   . Osteoporosis, postmenopausal 06/02/2011  . Polyp of colon   . Stroke [facility]) 07/03/2014   Past Surgical History:  Procedure Laterality Date  . APPENDECTOMY     age 73  . COLONOSCOPY    . POLYPECTOMY    . TUBAL LIGATION    . UPPER GASTROINTESTINAL ENDOSCOPY     Family History  Problem Relation Age of Onset  . Cancer Son        testicular cancer  . Breast cancer Sister   . Skin cancer Sister   . Colon cancer Neg Hx   . Esophageal cancer Neg Hx   . Rectal cancer Neg Hx   . Stomach cancer Neg Hx    Social History  Substance Use Topics  . Smoking status: Never Smoker  . Smokeless tobacco: Never Used  . Alcohol use No      Review of Systems Per HPI    Objective:   Physical Exam    Constitutional: She is oriented to person, place, and time. She appears well-developed and well-nourished. No distress.  HENT:  Head: Normocephalic and atraumatic.  Mouth/Throat: Oropharynx is clear and moist.  Eyes: Conjunctivae are normal.  Cardiovascular: Normal rate, regular rhythm and normal heart sounds.   Pulmonary/Chest: Effort normal and breath sounds normal.  Neurological: She is alert and oriented to person, place, and time.  Skin: Skin is warm and dry. She is not diaphoretic.  Multiple areas of resolving ecchymosis on bilateral forearms.   Vitals reviewed.     BP (!) 150/72 (BP Location: Left Arm, Patient Position: Sitting, Cuff Size: Normal)   Pulse 89   Temp 98.5 F (36.9 C) (Oral)   Resp 14   Ht 5' (1.524 m)   Wt 156 lb (70.8 kg)   SpO2 98%   BMI 30.47 kg/m  Wt Readings from Last 3 Encounters:  10/04/16 156 lb (70.8 kg)  09/17/16 155 lb (70.3 kg)  07/30/16 152 lb (68.9 kg)   BP Readings from Last 3 Encounters:  10/04/16 (!) 150/72  09/25/16 (!) 178/73  09/17/16 130/64       Assessment & Plan:  1. Essential  hypertension - continue current medications, follow up as scheduled with cardiology at end of month  2. Primary Parkinsonism ([place]) - discussed fall avoidance and encouraged patient to continue to use walker at home, to avoid quick movements - follow up with Dr. Sharlet Salina in 3 months  3. DNR (do not resuscitate) discussion - patient's PCP out of office currently, discussed with patient and her caregiver the patient's desire for no resuscitation, temporary form completed until patient's PCP returns. Completed form given to patient's care giver.  - follow up with Dr. Sharlet Salina in 3 months  Over 30 minutes were spent face-to-face with the patient during this encounter and >50% of that time was spent on counseling and coordination of care   Clarene Reamer, FNP-BC  [facility] [place], [place]  10/07/2016 7:39 AM

## 2016-10-11 ENCOUNTER — Telehealth: Payer: Self-pay | Admitting: Neurology

## 2016-10-11 NOTE — Telephone Encounter (Signed)
Caller: Sarah [name]  Urgent? No   Reason for the call: Regarding her Neuropathy and the pain in her foot. Please call. Thanks

## 2016-10-11 NOTE — Telephone Encounter (Signed)
Patient called to let us know that she had a bad fall last week.  She did go to ER but is doing better now.  She said that the voltaren gel helps her feet at night.  She said that she was running low but [name] (caregiver) said that she still has refills and they will get it filled.

## 2016-10-25 ENCOUNTER — Ambulatory Visit (INDEPENDENT_AMBULATORY_CARE_PROVIDER_SITE_OTHER): Payer: Medicare Other | Admitting: Nurse Practitioner

## 2016-10-25 ENCOUNTER — Encounter: Payer: Self-pay | Admitting: Nurse Practitioner

## 2016-10-25 VITALS — BP 130/60 | HR 80 | Ht 60.0 in | Wt 165.8 lb

## 2016-10-25 DIAGNOSIS — Z79899 Other long term (current) drug therapy: Secondary | ICD-10-CM | POA: Diagnosis not present

## 2016-10-25 DIAGNOSIS — R5383 Other fatigue: Secondary | ICD-10-CM

## 2016-10-25 DIAGNOSIS — I63511 Cerebral infarction due to unspecified occlusion or stenosis of right middle cerebral artery: Secondary | ICD-10-CM

## 2016-10-25 NOTE — Patient Instructions (Addendum)
We will be checking the following labs today - TSH  Medication Instructions:    Continue with your current medicines.     Testing/Procedures To Be Arranged:  N/A  Follow-Up:  See Dr. Marlou Porch in 6 months.    Other Special Instructions:   N/A    If you need a refill on your cardiac medications before your next appointment, please call your pharmacy.   Call the [place] office at [phone] if you have any questions, problems or concerns.

## 2016-10-25 NOTE — Progress Notes (Signed)
CARDIOLOGY OFFICE NOTE  Date:  10/25/2016    Roe Coombs Date of Birth: 1926/04/21 Medical Record #[id number]  PCP:  Hoyt Koch, MD  Cardiologist:  [facility]  Chief Complaint  Patient presents with  . Follow-up    Referred by neuro for EKG due to medicines.     History of Present Illness: Sarah [name] is a 83 y.o. female who presents today for a work in visit. Seen for Dr. Marlou Porch. Prior patient of Dr. Sherryl Barters.   She has a history of atypical chest pain, no known ischemic heart disease, normal nuclear stress test 06/2010 with EF of 88%, history of hyperlipidemia - statin intolerant because of leg weakness, memory loss and significant Parkinson's disease with frequent falls and imbalance. In July 2016 she had a right brain stroke causing some difficulty with slurring of speech, now on Plavix.   Last seen back in May of 2017. Noted some occasional chest discomfort that was relieved with sl NTG. Also with neuropathy. Not able to drive anymore.   Comes in today. Here with her caretaker. Using a walker. Appears to decline in a generalized fashion.  More deconditioned. Recurrent stroke in February and has not really recovered. Now with 24 hour caregiver support. She has been depressed. Remains on Plavix. No longer on high dose aspirin.  Also on nortriptyline - EKG for QT. EKG from 09/25/2016 was ok.  She is a DNR. Apparently pretty fatigued. Sleeping "more". Has had some frequent med changes. Rare use of NTG continues. Does get too much salt - likes country ham and has food brought in from the outside. She does have swelling but seems to improve over night.   Past Medical History:  Diagnosis Date  . Bladder incontinence   . Chest pain    S/P nuclear 07/17/2008  normal; EF 88% with no ischemia  . Chronic venous insufficiency    BLE edema, chronic, neg venous US 2/13  . Constipation    hx of  . Depression   . Diverticulitis   . Esophageal stricture    s/p dilation  04/2012  . GERD (gastroesophageal reflux disease)   . History of chicken pox   . Hyperlipidemia   . Hypertension   . Osteoarthritis   . Osteoporosis, postmenopausal 06/02/2011  . Polyp of colon   . Stroke [facility]) 07/03/2014    Past Surgical History:  Procedure Laterality Date  . APPENDECTOMY     age 89  . COLONOSCOPY    . POLYPECTOMY    . TUBAL LIGATION    . UPPER GASTROINTESTINAL ENDOSCOPY       Medications: Current Meds  Medication Sig  . acetaminophen (TYLENOL) 500 MG tablet Take 1 tablet (500 mg total) by mouth every 8 (eight) hours as needed (for pain).  Marland Kitchen ALPRAZolam (XANAX) 0.5 MG tablet TAKE 1 TABLET TWICE DAILY.  . carbidopa-levodopa (SINEMET IR) 25-100 MG tablet Take 1 tablet by mouth 3 (three) times daily.  . Cholecalciferol (VITAMIN D PO) Take 1 tablet by mouth daily.   . clopidogrel (PLAVIX) 75 MG tablet Take 1 tablet (75 mg total) by mouth daily.  Marland Kitchen HALCION 0.25 MG tablet TAKE 1/2 TABLET AT BEDTIME AS NEEDED FOR SLEEP.  Marland Kitchen ipratropium (ATROVENT) 0.06 % nasal spray Place 2 sprays into both nostrils 4 (four) times daily.  . nitroGLYCERIN (NITROSTAT) 0.4 MG SL tablet Place 1 tablet (0.4 mg total) under the tongue every 5 (five) minutes as needed for chest pain.  . nortriptyline (  PAMELOR) 50 MG capsule Take 1 capsule (50 mg total) by mouth at bedtime.  . pantoprazole (PROTONIX) 40 MG tablet Take 1 tablet (40 mg total) by mouth daily.  Marland Kitchen perphenazine (TRILAFON) 2 MG tablet TAKE ONE TABLET AT BEDTIME.  . polyethylene glycol (MIRALAX / GLYCOLAX) packet Take 17 g by mouth daily.  . pravastatin (PRAVACHOL) 20 MG tablet TAKE (1/2) TABLET DAILY.  Marland Kitchen solifenacin (VESICARE) 10 MG tablet TAKE (1) TABLET DAILY AS NEEDED FOR BLADDER  . traMADol (ULTRAM) 50 MG tablet TAKE (1) TABLET EVERY SIX HOURS AS NEEDED FOR PAIN.  . [DISCONTINUED] aspirin 325 MG EC tablet Take 1 tablet (325 mg total) by mouth daily.  . [DISCONTINUED] diclofenac sodium (VOLTAREN) 1 % GEL Apply  To feet 3 times daily  prn for pain  . [DISCONTINUED] ofloxacin (OCUFLOX) 0.3 % ophthalmic solution   . [DISCONTINUED] prednisoLONE acetate (PRED FORTE) 1 % ophthalmic suspension Place 1 drop into the right eye 2 (two) times daily.     Allergies: Allergies  Allergen Reactions  . Lipitor [Atorvastatin Calcium] Other (See Comments)    Leg weakness,memory loss  . Lovastatin Other (See Comments)    Leg weakness  . Celebrex [Celecoxib] Itching and Rash  . Latex Rash  . Macrobid [employer] Macro] Other (See Comments)    Unknown per patient  . Other Other (See Comments)    Always pads    Social History: The patient  reports that she has never smoked. She has never used smokeless tobacco. She reports that she does not drink alcohol or use drugs.   Family History: The patient's family history includes Breast cancer in her sister; Cancer in her son; Skin cancer in her sister.   Review of Systems: Please see the history of present illness.   Otherwise, the review of systems is positive for none.   All other systems are reviewed and negative.   Physical Exam: VS:  BP 130/60   Pulse 80   Ht 5' (1.524 m)   Wt 165 lb 12.8 oz (75.2 kg)   BMI 32.38 kg/m  .  BMI Body mass index is 32.38 kg/m.  Wt Readings from Last 3 Encounters:  10/25/16 165 lb 12.8 oz (75.2 kg)  10/04/16 156 lb (70.8 kg)  09/17/16 155 lb (70.3 kg)    General: Pleasant. Elderly female. Chronically ill. Little hard to understand but alert and in no acute distress. Some tremor noted.   HEENT: Normal.  Neck: Supple, no JVD, carotid bruits, or masses noted.  Cardiac: Regular rate and rhythm. No murmurs, rubs, or gallops. No edema.  Respiratory:  Lungs are clear to auscultation bilaterally with normal work of breathing.  GI: Soft and nontender.  MS: No deformity or atrophy. Gait and ROM intact but looks very unsteady. Stooped over with walking.  Skin: Warm and dry. Color is normal.  Neuro:  Strength and sensation are intact and  no gross focal deficits noted.  Psych: Alert, appropriate and with normal affect.   LABORATORY DATA:  EKG:  EKG is ordered today. This demonstrates NSR. QT is 384 ms.   Lab Results  Component Value Date   WBC 5.6 05/10/2016   HGB 11.2 (L) 05/10/2016   HCT 34.9 (L) 05/10/2016   PLT 356 05/10/2016   GLUCOSE 95 05/10/2016   CHOL 139 04/28/2016   TRIG 61 04/28/2016   HDL 70 04/28/2016   LDLDIRECT 101.1 10/21/2010   LDLCALC 57 04/28/2016   ALT 8 (L) 05/03/2016   AST 13 (  L) 05/03/2016   NA 133 (L) 05/10/2016   K 3.9 05/10/2016   CL 103 05/10/2016   CREATININE 0.84 05/10/2016   BUN 13 05/10/2016   CO2 21 (L) 05/10/2016   TSH 4.89 (H) 01/29/2015   INR 0.98 04/27/2016   HGBA1C 5.7 (H) 04/28/2016     BNP (last 3 results) No results for input(s): BNP in the last 8760 hours.  ProBNP (last 3 results) No results for input(s): PROBNP in the last 8760 hours.   Other Studies Reviewed Today:  Echo Study Conclusions 04/2016  - Left ventricle: The cavity size was normal. Wall thickness was   increased in a pattern of mild LVH. Systolic function was normal.   The estimated ejection fraction was in the range of 55% to 60%.   Wall motion was normal; there were no regional wall motion   abnormalities. Doppler parameters are consistent with abnormal   left ventricular relaxation (grade 1 diastolic dysfunction). - Aortic valve: There was mild regurgitation. - Mitral valve: Calcified annulus.  Impressions:  - Normal LV systolic function; mild LVH; grade 1 diastolic   dysfunction; calcified aortic valve with mild AI.   Assessment/Plan:  1. Chest pain - this is chronic and unchanged - rare use of NTG - no prior objective evidence of known ischemic heart disease.   2. Statin intolerance - with her age, would not aggressively treat. She is on low dose Pravachol.   3. Peripheral neuropathy - quite limiting for her. Not discussed today.   4. History of recurrent stroke - on  chronic Plavix. She is not on high dose aspirin.   5. Advanced Parkinson's - seems to be the most limiting factor.   6. High risk medicine - EKG ok today.   7. Fatigue - probably multifactorial. Would check TSH.   Current medicines are reviewed with the patient today.  The patient does not have concerns regarding medicines other than what has been noted above.  The following changes have been made:  See above.  Labs/ tests ordered today include:    Orders Placed This Encounter  Procedures  . TSH  . EKG 12-Lead     Disposition:   FU with Dr. Marlou Porch in 6 months.   Patient is agreeable to this plan and will call if any problems develop in the interim.   SignedTruitt Merle, NP  10/25/2016 4:06 PM  [place] [address] [place], [place]  [address] Phone: [phone] Fax: [phone]

## 2016-10-26 DIAGNOSIS — L821 Other seborrheic keratosis: Secondary | ICD-10-CM | POA: Diagnosis not present

## 2016-10-26 DIAGNOSIS — L720 Epidermal cyst: Secondary | ICD-10-CM | POA: Diagnosis not present

## 2016-10-26 LAB — TSH: TSH: 5.89 u[IU]/mL — ABNORMAL HIGH (ref 0.450–4.500)

## 2016-10-27 ENCOUNTER — Other Ambulatory Visit: Payer: Self-pay | Admitting: Family

## 2016-10-27 NOTE — Telephone Encounter (Signed)
Last refill was 09/30/16 per [facility] 

## 2016-10-28 NOTE — Telephone Encounter (Signed)
Greg approved refill, printed rx and faxed to [facility].../lm,b

## 2016-10-29 ENCOUNTER — Encounter: Payer: Self-pay | Admitting: Nurse Practitioner

## 2016-10-29 ENCOUNTER — Ambulatory Visit (INDEPENDENT_AMBULATORY_CARE_PROVIDER_SITE_OTHER): Payer: Medicare Other | Admitting: Nurse Practitioner

## 2016-10-29 VITALS — BP 146/66 | HR 89 | Temp 98.6°F | Ht 60.0 in | Wt 165.0 lb

## 2016-10-29 DIAGNOSIS — E039 Hypothyroidism, unspecified: Secondary | ICD-10-CM

## 2016-10-29 NOTE — Patient Instructions (Addendum)
Chronic fatigue: possibly multifactorial: medication side effects vs age vs depression?  Start 13mcg of levothyroixine, re eval in 24months.

## 2016-10-29 NOTE — Progress Notes (Signed)
Subjective:  Patient ID: Sarah [name], female    DOB: 1926-04-17  Age: 83 y.o. MRN: [id number]  CC: Follow-up (follow up on lab work/tsh/nervouse and weak all the time/Rita is here with pt,caregiver)   HPI Chronic fatigue: Ongoing for several years. No improvement with PT. She states she has no motivation to take walks with caregivers.. Evaluated by cardiology, no abnormal finding. Wants re eval of TSH: possible subclinical hypothyroidism?  Outpatient Medications Prior to Visit  Medication Sig Dispense Refill  . acetaminophen (TYLENOL) 500 MG tablet Take 1 tablet (500 mg total) by mouth every 8 (eight) hours as needed (for pain). 180 tablet 2  . ALPRAZolam (XANAX) 0.5 MG tablet TAKE 1 TABLET TWICE DAILY. 60 tablet 0  . carbidopa-levodopa (SINEMET IR) 25-100 MG tablet Take 1 tablet by mouth 3 (three) times daily. 270 tablet 3  . Cholecalciferol (VITAMIN D PO) Take 1 tablet by mouth daily.     . clopidogrel (PLAVIX) 75 MG tablet Take 1 tablet (75 mg total) by mouth daily. 90 tablet 2  . HALCION 0.25 MG tablet TAKE 1/2 TABLET AT BEDTIME AS NEEDED FOR SLEEP. 15 tablet 2  . ipratropium (ATROVENT) 0.06 % nasal spray Place 2 sprays into both nostrils 4 (four) times daily. 15 mL 12  . nitroGLYCERIN (NITROSTAT) 0.4 MG SL tablet Place 1 tablet (0.4 mg total) under the tongue every 5 (five) minutes as needed for chest pain. 5 tablet 0  . nortriptyline (PAMELOR) 50 MG capsule Take 1 capsule (50 mg total) by mouth at bedtime. 30 capsule 11  . pantoprazole (PROTONIX) 40 MG tablet Take 1 tablet (40 mg total) by mouth daily. 30 tablet 11  . perphenazine (TRILAFON) 2 MG tablet TAKE ONE TABLET AT BEDTIME. 30 tablet 3  . polyethylene glycol (MIRALAX / GLYCOLAX) packet Take 17 g by mouth daily. 14 each 0  . pravastatin (PRAVACHOL) 20 MG tablet TAKE (1/2) TABLET DAILY. 45 tablet 3  . solifenacin (VESICARE) 10 MG tablet TAKE (1) TABLET DAILY AS NEEDED FOR BLADDER 30 tablet 0  . traMADol (ULTRAM) 50 MG  tablet TAKE (1) TABLET EVERY SIX HOURS AS NEEDED FOR PAIN. 10 tablet 0   No facility-administered medications prior to visit.     ROS See HPI  Objective:  BP (!) 146/66   Pulse 89   Temp 98.6 F (37 C)   Ht 5' (1.524 m)   Wt 165 lb (74.8 kg)   SpO2 94%   BMI 32.22 kg/m   BP Readings from Last 3 Encounters:  11/02/16 118/78  10/29/16 (!) 146/66  10/25/16 130/60    Wt Readings from Last 3 Encounters:  11/02/16 162 lb (73.5 kg)  10/29/16 165 lb (74.8 kg)  10/25/16 165 lb 12.8 oz (75.2 kg)    Physical Exam  Constitutional: She is oriented to person, place, and time. No distress.  Neck: No thyromegaly present.  Cardiovascular: Normal rate and regular rhythm.   Pulmonary/Chest: Effort normal.  Musculoskeletal: She exhibits edema. She exhibits no tenderness.  Neurological: She is alert and oriented to person, place, and time.  Skin: Skin is warm and dry. No erythema.  Vitals reviewed.   Lab Results  Component Value Date   WBC 5.6 05/10/2016   HGB 11.2 (L) 05/10/2016   HCT 34.9 (L) 05/10/2016   PLT 356 05/10/2016   GLUCOSE 95 05/10/2016   CHOL 139 04/28/2016   TRIG 61 04/28/2016   HDL 70 04/28/2016   LDLDIRECT 101.1 10/21/2010   LDLCALC 57  04/28/2016   ALT 8 (L) 05/03/2016   AST 13 (L) 05/03/2016   NA 133 (L) 05/10/2016   K 3.9 05/10/2016   CL 103 05/10/2016   CREATININE 0.84 05/10/2016   BUN 13 05/10/2016   CO2 21 (L) 05/10/2016   TSH 6.41 (H) 11/02/2016   INR 0.98 04/27/2016   HGBA1C 5.7 (H) 04/28/2016    Dg Shoulder Right  Result Date: 09/25/2016 CLINICAL DATA:  [place] out of bit head this morning with shoulder pain. EXAM: RIGHT SHOULDER - 2+ VIEW COMPARISON:  None. FINDINGS: There is no evidence of fracture or dislocation. There is no evidence of arthropathy or other focal bone abnormality. Soft tissues are unremarkable. IMPRESSION: Negative. Electronically Signed   By: Nelson Chimes M.D.   On: 09/25/2016 13:54   Ct Head Wo Contrast  Result Date:  09/25/2016 CLINICAL DATA:  83 y/o female status post fall. Fell this morning landing on right side. Head and neck pain. On blood thinners. EXAM: CT HEAD WITHOUT CONTRAST CT CERVICAL SPINE WITHOUT CONTRAST TECHNIQUE: Multidetector CT imaging of the head and cervical spine was performed following the standard protocol without intravenous contrast. Multiplanar CT image reconstructions of the cervical spine were also generated. COMPARISON:  Brain MRI 04/27/2016.  Head CT 04/27/2016 and earlier. FINDINGS: CT HEAD FINDINGS Brain: Patchy and confluent bilateral cerebral white matter hypodensity extending into the deep white matter capsules. Focal chronic lacune of the left corona radiata. No midline shift, ventriculomegaly, intracranial hemorrhage or evidence of cortically based acute infarction. No cortical encephalomalacia identified. Coarsely calcified extra-axial mass arising from the right anterior clinoid process measures 15 mm diameter (series 5, image 27) and is chronic compatible with meningioma. This has not significantly changed since 2016. No other intracranial mass lesion or mass effect. Vascular: Calcified atherosclerosis at the skull base. Skull: Stable and intact. Sinuses/Orbits: Mild mucosal thickening now on the right maxillary sinus. Other visible paranasal sinuses and mastoids are stable and well pneumatized. Other: No scalp hematoma.  Stable orbits soft tissues. CT CERVICAL SPINE FINDINGS Alignment: Straightening of cervical lordosis. Mild degenerative appearing anterolisthesis at C3-C4 and C7-T1. Bilateral posterior element alignment is within normal limits. Skull base and vertebrae: Visualized skull base is intact. No atlanto-occipital dissociation. No acute cervical spine fracture. Soft tissues and spinal canal: Negative for age noncontrast neck soft tissues. Disc levels: Advanced chronic disc and endplate degeneration O1-H0 through C7-T1. Associated left greater than right neural foraminal stenosis  at those levels. Upper chest: Visible upper thoracic levels appear intact. Negative lung apices. IMPRESSION: 1. No acute intracranial abnormality. No acute fracture in the cervical spine. No acute traumatic injury identified. 2. Advanced chronic small vessel disease. 3. Chronic benign 15 mm meningioma suspected at the right anterior clinoid process. Electronically Signed   By: Genevie Ann M.D.   On: 09/25/2016 14:20   Ct Cervical Spine Wo Contrast  Result Date: 09/25/2016 CLINICAL DATA:  83 y/o female status post fall. Fell this morning landing on right side. Head and neck pain. On blood thinners. EXAM: CT HEAD WITHOUT CONTRAST CT CERVICAL SPINE WITHOUT CONTRAST TECHNIQUE: Multidetector CT imaging of the head and cervical spine was performed following the standard protocol without intravenous contrast. Multiplanar CT image reconstructions of the cervical spine were also generated. COMPARISON:  Brain MRI 04/27/2016.  Head CT 04/27/2016 and earlier. FINDINGS: CT HEAD FINDINGS Brain: Patchy and confluent bilateral cerebral white matter hypodensity extending into the deep white matter capsules. Focal chronic lacune of the left corona radiata. No midline shift,  ventriculomegaly, intracranial hemorrhage or evidence of cortically based acute infarction. No cortical encephalomalacia identified. Coarsely calcified extra-axial mass arising from the right anterior clinoid process measures 15 mm diameter (series 5, image 27) and is chronic compatible with meningioma. This has not significantly changed since 2016. No other intracranial mass lesion or mass effect. Vascular: Calcified atherosclerosis at the skull base. Skull: Stable and intact. Sinuses/Orbits: Mild mucosal thickening now on the right maxillary sinus. Other visible paranasal sinuses and mastoids are stable and well pneumatized. Other: No scalp hematoma.  Stable orbits soft tissues. CT CERVICAL SPINE FINDINGS Alignment: Straightening of cervical lordosis. Mild  degenerative appearing anterolisthesis at C3-C4 and C7-T1. Bilateral posterior element alignment is within normal limits. Skull base and vertebrae: Visualized skull base is intact. No atlanto-occipital dissociation. No acute cervical spine fracture. Soft tissues and spinal canal: Negative for age noncontrast neck soft tissues. Disc levels: Advanced chronic disc and endplate degeneration Z6-X0 through C7-T1. Associated left greater than right neural foraminal stenosis at those levels. Upper chest: Visible upper thoracic levels appear intact. Negative lung apices. IMPRESSION: 1. No acute intracranial abnormality. No acute fracture in the cervical spine. No acute traumatic injury identified. 2. Advanced chronic small vessel disease. 3. Chronic benign 15 mm meningioma suspected at the right anterior clinoid process. Electronically Signed   By: Genevie Ann M.D.   On: 09/25/2016 14:20    Assessment & Plan:   Zenola was seen today for follow-up.  Diagnoses and all orders for this visit:  Hypothyroidism, unspecified type -     TSH; Future -     T4, free; Future -     T3, free; Future   I am having Ms. Brunelle maintain her Cholecalciferol (VITAMIN D PO), acetaminophen, solifenacin, clopidogrel, pantoprazole, polyethylene glycol, pravastatin, nortriptyline, carbidopa-levodopa, ipratropium, perphenazine, nitroGLYCERIN, HALCION, traMADol, and ALPRAZolam.  No orders of the defined types were placed in this encounter.   Follow-up: Return in about 3 months (around 01/29/2017).  Wilfred Lacy, NP

## 2016-11-02 ENCOUNTER — Other Ambulatory Visit (INDEPENDENT_AMBULATORY_CARE_PROVIDER_SITE_OTHER): Payer: Medicare Other

## 2016-11-02 ENCOUNTER — Encounter: Payer: Self-pay | Admitting: Family Medicine

## 2016-11-02 ENCOUNTER — Ambulatory Visit (INDEPENDENT_AMBULATORY_CARE_PROVIDER_SITE_OTHER): Payer: Medicare Other | Admitting: Family Medicine

## 2016-11-02 VITALS — BP 118/78 | HR 80 | Ht 60.0 in | Wt 162.0 lb

## 2016-11-02 DIAGNOSIS — R946 Abnormal results of thyroid function studies: Secondary | ICD-10-CM

## 2016-11-02 DIAGNOSIS — M4698 Unspecified inflammatory spondylopathy, sacral and sacrococcygeal region: Secondary | ICD-10-CM | POA: Diagnosis not present

## 2016-11-02 DIAGNOSIS — Z8673 Personal history of transient ischemic attack (TIA), and cerebral infarction without residual deficits: Secondary | ICD-10-CM | POA: Diagnosis not present

## 2016-11-02 DIAGNOSIS — M170 Bilateral primary osteoarthritis of knee: Secondary | ICD-10-CM | POA: Diagnosis not present

## 2016-11-02 DIAGNOSIS — E039 Hypothyroidism, unspecified: Secondary | ICD-10-CM

## 2016-11-02 DIAGNOSIS — E538 Deficiency of other specified B group vitamins: Secondary | ICD-10-CM

## 2016-11-02 DIAGNOSIS — M47818 Spondylosis without myelopathy or radiculopathy, sacral and sacrococcygeal region: Secondary | ICD-10-CM

## 2016-11-02 LAB — T4, FREE: Free T4: 1.51 ng/dL (ref 0.60–1.60)

## 2016-11-02 LAB — T3, FREE: T3, Free: 6 pg/mL — ABNORMAL HIGH (ref 2.3–4.2)

## 2016-11-02 LAB — TSH: TSH: 6.41 u[IU]/mL — AB (ref 0.35–4.50)

## 2016-11-02 MED ORDER — CYANOCOBALAMIN 1000 MCG/ML IJ SOLN
1000.0000 ug | Freq: Once | INTRAMUSCULAR | Status: AC
  Administered 2016-11-02: 1000 ug via INTRAMUSCULAR

## 2016-11-02 MED ORDER — LEVOTHYROXINE SODIUM 25 MCG PO TABS: 25.0000 ug | ORAL_TABLET | Freq: Every day | ORAL | 3 refills | Status: DC

## 2016-11-02 NOTE — Progress Notes (Signed)
CC: Back pain followup  HPI: Patient is a very pleasant 83 year old female coming in with chronic back pain. Patient does have sacroiliac dysfunction. Patient has had arthritis and has had multiple injections. Worsening pain again on the left side. Waking her up at night, no radiation down the axis of the pain is severe hurts with any type of flexion or extension of the back.  Left knee pain. Patient is having worsening symptoms. Did fall 2 months ago. Having worsening symptoms again. Some increase instability issues. Patient continues to walk with the aid of a walker. Patient's Parkinson's is getting worse as well. States that the left knee is giving him more pain overall and is affecting daily activities more    Previous imaging studies XR lumbar spine 06/07/2012: Findings: There is curvature convex to the left. There is advanced degenerative disc disease at T12-L1, L1-2 and L4-5. The L5-S1 level is transitional. There is anterolisthesis of 3 mm of L4-5.  There is retrolisthesis of 3 mm at T12-L1 and L1-2. These degenerative changes have worsened since 2010. No evidence of fracture.  IMPRESSION:  Curvature convex to the left. Worsening of degenerative disc disease at T12-L1, L1-2 and L4-5.   Past medical, surgical, family and social history reviewed. Medications reviewed all in the electronic medical record.  Review of Systems: No headache, visual changes, nausea, vomiting, diarrhea, constipation, dizziness, abdominal pain, skin rash, fevers, chills, night sweats, weight loss, swollen lymph nodes, body aches, joint swelling, chest pain, shortness of breath, mood changes. Positive muscle aches   Objective:    Blood pressure 118/78, pulse 80, height 5' (1.524 m), weight 162 lb (73.5 kg).   As stated and reviewed as of 11/02/16  General: No apparent distress alert and oriented x3 mood and affect normal, dressed appropriately. Mild left-sided face droop and worsening masked facies.  HEENT:  Pupils equal, extraocular movements intact Respiratory: Patient's speak in full sentences and does not appear short of breath Cardiovascular: 1+ pitting edema to mid thigh bilaterally Skin: Warm dry intact with no signs of infection or rash on extremities or on axial skeleton. Abdomen: Soft nontender Neuro: Cranial nerves II through XII are intact, neurovascularly intact in all extremities with 2+ DTRs and 2+ pulses. Lymph: No lymphadenopathy of posterior or anterior cervical chain or axillae bilaterally.  Gait continues to have unsteadiness and shuffling gait but uses a walker. Worsening though over the course of time MSK: Arthritic changes of multiple joints. Back Exam:  Inspection: Significant degenerative scoliosis changes Motion: Flexion 20 deg, Extension 25 deg, Side Bending to 40 deg bilaterally,  Rotation to 40 deg bilaterally  SLR laying: Negative  XSLR laying: Negative  Palpable tenderness: Severe tenderness to palpation over the left SI joint. FABER: negative. Sensory change: Gross sensation intact to all lumbar and sacral dermatomes.  Reflexes: 2+ at both patellar tendons, 2+ at achilles tendons, Babinski's downgoing.  Strength at foot  3+ out of 5 but symmetric. Patient does have some mild rigidity noted  Procedure: Real-time Ultrasound Guided Injection of left sacroiliac joint Device: GE Logiq Q7 Ultrasound guided injection is preferred based studies that show increased duration, increased effect, greater accuracy, decreased procedural pain, increased response rate, and decreased cost with ultrasound guided versus blind injection.  Verbal informed consent obtained.  Time-out conducted.  Noted no overlying erythema, induration, or other signs of local infection.  Skin prepped in a sterile fashion.  Local anesthesia: Topical Ethyl chloride.  With sterile technique and under real time ultrasound guidance:  With  a 21-gauge 2 inch needle patient was injected with a total of 0.5  mL of 0.5% Marcaine and 0.5 mL of Kenalog 40 mg/dL into the left sacroiliac joint Completed without difficulty  Pain immediately resolved suggesting accurate placement of the medication.  Advised to call if fevers/chills, erythema, induration, drainage, or persistent bleeding.  Images permanently stored and available for review in the ultrasound unit.  Impression: Technically successful ultrasound guided injection.   After informed written and verbal consent, patient was seated on exam table. Left knee was prepped with alcohol swab and utilizing anterolateral approach, patient's left knee space was injected with 4:1  marcaine 0.5%: Kenalog 40mg /dL. Patient tolerated the procedure well without immediate complications.   Impression and Recommendations:     This case required medical decision making of moderate complexity.

## 2016-11-02 NOTE — Patient Instructions (Addendum)
Good to see you  You know the drill  Keep being Deane.  You know where I am if you need me See me again in 10 weeks.

## 2016-11-03 ENCOUNTER — Other Ambulatory Visit: Payer: Self-pay | Admitting: *Deleted

## 2016-11-03 ENCOUNTER — Telehealth: Payer: Self-pay | Admitting: Nurse Practitioner

## 2016-11-03 MED ORDER — BIOTIN 5000 MCG PO CAPS: 1.0000 | ORAL_CAPSULE | Freq: Every day | ORAL | 0 refills | Status: DC

## 2016-11-03 MED ORDER — VITAMIN B-12 1000 MCG PO TABS: 1000.0000 ug | ORAL_TABLET | Freq: Every day | ORAL | 0 refills | Status: DC

## 2016-11-03 NOTE — Assessment & Plan Note (Signed)
Injected today regimen and home exercises. Patient is going to continue this. Has had multiple strokes and some increasing weakness. Patient will come back and see me again in 10 weeks

## 2016-11-03 NOTE — Telephone Encounter (Signed)
New message    Pt daughter is calling about pt lab work and her biotin. She said she is taking 5067mcg a day. Please call.

## 2016-11-03 NOTE — Telephone Encounter (Signed)
S/w pt's daughter per [facility]) wanted to know if biotin effects thyroid due to pt having hypothyroidism. Stated per Cecille Rubin since Bethany is managing pt's thyroid pt's daughter needs to call that office.   Biotin and b 12 were not on pt's medication list, updated today and pt's daughter will call Dr. Nathanial Millman office.

## 2016-11-03 NOTE — Assessment & Plan Note (Signed)
Repeat injection in the left knee. Did respond well to it previously. Hopefully this will be beneficial for quite some time. We discussed icing regimen, home exercises, which activities to do in which ones to avoid. Patient will start to increase activity as tolerated. Patient will continue to work with the aid of a walker. Follow-up again in 10 weeks

## 2016-11-08 ENCOUNTER — Telehealth: Payer: Self-pay | Admitting: Internal Medicine

## 2016-11-08 NOTE — Telephone Encounter (Signed)
Spoke with [name]. She understand the direction of continued B12 injection and to stop Biotin and B12 tab

## 2016-11-08 NOTE — Telephone Encounter (Signed)
Can you please follow up with daughter about the if she needs to take B12 or what she needs to do. She states she was told something different on the phone and mychart says something different. Please follow up with them. Thank you.

## 2016-11-18 ENCOUNTER — Telehealth: Payer: Self-pay | Admitting: Neurology

## 2016-11-18 NOTE — Telephone Encounter (Signed)
Spoke with patient about new onset headache. Started three days ago and she describes it as feeling like the top of her head is "filled with water" and it "feels heavy" when she turns. States headache has been constant. She is still on Nortriptyline 50 mg daily. States she has tried tylenol for current headache which didn't help and tramadol which has "quieted" it a little. Only new medication is Levothyroxine which was started on 11/02/16. She denies vision changes, nausea, and vomiting.   Dr. Posey Pronto please advise.

## 2016-11-18 NOTE — Telephone Encounter (Signed)
Sounds like a tension headache, start tizandine 2mg  at bedtime as needed.  May consider medrol dosepak going forward, if no improvement.  Markiyah Gahm K. Posey Pronto, DO

## 2016-11-18 NOTE — Telephone Encounter (Signed)
Patient states that she has a headache and it has been going on for the last 3 days and would like to know if we could call her in something

## 2016-11-19 ENCOUNTER — Other Ambulatory Visit: Payer: Self-pay | Admitting: Internal Medicine

## 2016-11-19 ENCOUNTER — Other Ambulatory Visit: Payer: Self-pay | Admitting: *Deleted

## 2016-11-19 MED ORDER — TIZANIDINE HCL 2 MG PO TABS: 2.0000 mg | ORAL_TABLET | Freq: Every day | ORAL | 3 refills | Status: DC

## 2016-11-19 NOTE — Telephone Encounter (Signed)
Pat daughter calling back as they still haven't heard from our office.  Please call and advise.

## 2016-11-19 NOTE — Telephone Encounter (Signed)
Called [name] back and gave her the instructions.  Rx sent in to pharmacy.

## 2016-11-19 NOTE — Telephone Encounter (Signed)
Ok to fill. printed 

## 2016-11-19 NOTE — Telephone Encounter (Signed)
[place] Controlled Substance Database checked. Last filled on 10/01/16

## 2016-11-19 NOTE — Telephone Encounter (Signed)
RX faxed to [facility] 

## 2016-11-24 ENCOUNTER — Other Ambulatory Visit: Payer: Self-pay | Admitting: Internal Medicine

## 2016-11-25 NOTE — Telephone Encounter (Signed)
Rx faxed

## 2016-11-26 ENCOUNTER — Encounter: Payer: Self-pay | Admitting: Neurology

## 2016-11-26 ENCOUNTER — Ambulatory Visit (INDEPENDENT_AMBULATORY_CARE_PROVIDER_SITE_OTHER): Payer: Medicare Other | Admitting: Neurology

## 2016-11-26 VITALS — BP 104/60 | HR 91 | Ht 60.0 in | Wt 165.5 lb

## 2016-11-26 DIAGNOSIS — G214 Vascular parkinsonism: Secondary | ICD-10-CM | POA: Diagnosis not present

## 2016-11-26 DIAGNOSIS — I63511 Cerebral infarction due to unspecified occlusion or stenosis of right middle cerebral artery: Secondary | ICD-10-CM | POA: Diagnosis not present

## 2016-11-26 DIAGNOSIS — G44221 Chronic tension-type headache, intractable: Secondary | ICD-10-CM

## 2016-11-26 NOTE — Patient Instructions (Addendum)
Increase tizanidine to 2mg  twice daily  Neck range of motion exercises  You may want to also get a soft tissue massage   Return to clinic in 3 months

## 2016-11-26 NOTE — Progress Notes (Signed)
[facility]  Follow-up Visit   Date: 11/26/16   Sarah [name] MRN: [id number] DOB: 05/16/1926   Interim History: Clelia D Chauncey is a 83 y.o. right-handed Caucasian female with history of HTN, HPL, depression, esophageal strictures, right corona radiata stroke ( residual right facial droop, 2018), vascular parkinson's disease, and GERD returning to the clinic for follow-up of Parkinson's disease and headaches.  The patient was accompanied to the clinic by her caregiver, [name].  History of present illness: Leg weakness started slowly around May 2014 when she noticed difficulty with walking and tripping on things. She would stumble frequently, no falls. EMG was performed and shows moderate peripheral neuropathy of the legs and L3-L4 old radiculopathy.   Caregiver says that she is more forgetful than usual, especially with conversation.    In April 2016, she was hospitalization for acute stroke manifested with slurred speech which has since resolved.  She was started on Plavix 75 mg for this.  During the fall of 2016, she started experiencing bifrontal throbbing headaches. She was give tizanidine, but did not tolerate this.  Neck PT helped. Her headaches are much improved since she started taking ginger supplements.   In June 2017 she was diagnosed with parkinsonism she noticed that her movements and become slower and she is able to do less work. She notices a marked difference in her walking when she is taking sinemet 1 tab TID.  In late January 2018, she developed acute onset of dysarthria and right facial droop and found to have small left frontal corona radiata stroke secondary to small vessel disease.  She was taking plavix 75mg  and with her new event, aspirin was added for 3 months.  She is mostly bothered by bifrontal throbbing headaches. She was given a prescription for topiramate in the hospital, but did not take this. She also complains of cold painful feet,  especially at bedtime.  For her headaches, nortriptyline 50mg  at bedtime was started which helped.    Over the past year, she has become sedentary and deconditioned.  UPDATE 09/17/2016:  She was seen by her PCP in May and noted to have generalized deconditioning for which home health was ordered. Ms. Wrisley called in May because of worsening restless sensation despite being treated for sinusitis and UTI, so I recommended increasing her sinemet to 1.5 tab TID, however since doing this she had increased weakness and being "unwell" went back down to 1 tab TID.  She continues to feel weak and has difficulty walking, and fortunately, has not suffered any falls.  Mood has been down and she is more worried about dying. She get social support through her faith and has a Company secretary who comes to visit her.  UPDATE 11/26/2016:  Since her last visit, here she suffered one fall while getting out of bed and was taken to the ER.  Fortunately, she only suffered superficial injuries.  She has also noticed increased tension headaches and shoulder tightness.  Tizanidine 2mg  at bedtime was started which has helped her rest at night.  She feels that she is bothered by the fact that there is someone always in her home because she requires 24-hr assistance and misses being home alone.  Mood is fair.  Overall, she has no new neurological symptoms, but feels that her health is declining.   Medications:  Current Outpatient Prescriptions on File Prior to Visit  Medication Sig Dispense Refill  . acetaminophen (TYLENOL) 500 MG tablet Take 1 tablet (500 mg total)  by mouth every 8 (eight) hours as needed (for pain). 180 tablet 2  . ALPRAZolam (XANAX) 0.5 MG tablet TAKE 1 TABLET TWICE DAILY. 60 tablet 0  . Biotin 5000 MCG CAPS Take 1 capsule (5,000 mcg total) by mouth daily. 30 capsule 0  . carbidopa-levodopa (SINEMET IR) 25-100 MG tablet Take 1 tablet by mouth 3 (three) times daily. 270 tablet 3  . Cholecalciferol (VITAMIN D PO) Take  1 tablet by mouth daily.     . clopidogrel (PLAVIX) 75 MG tablet Take 1 tablet (75 mg total) by mouth daily. 90 tablet 2  . HALCION 0.25 MG tablet TAKE 1/2 TABLET AT BEDTIME AS NEEDED FOR SLEEP. 10 tablet 0  . ipratropium (ATROVENT) 0.06 % nasal spray Place 2 sprays into both nostrils 4 (four) times daily. 15 mL 12  . levothyroxine (SYNTHROID, LEVOTHROID) 25 MCG tablet Take 1 tablet (25 mcg total) by mouth daily before breakfast. 30 tablet 3  . nitroGLYCERIN (NITROSTAT) 0.4 MG SL tablet Place 1 tablet (0.4 mg total) under the tongue every 5 (five) minutes as needed for chest pain. 5 tablet 0  . nortriptyline (PAMELOR) 50 MG capsule Take 1 capsule (50 mg total) by mouth at bedtime. 30 capsule 11  . pantoprazole (PROTONIX) 40 MG tablet Take 1 tablet (40 mg total) by mouth daily. 30 tablet 11  . perphenazine (TRILAFON) 2 MG tablet TAKE ONE TABLET AT BEDTIME. 30 tablet 3  . polyethylene glycol (MIRALAX / GLYCOLAX) packet Take 17 g by mouth daily. 14 each 0  . pravastatin (PRAVACHOL) 20 MG tablet TAKE (1/2) TABLET DAILY. 45 tablet 3  . solifenacin (VESICARE) 10 MG tablet TAKE (1) TABLET DAILY AS NEEDED FOR BLADDER 30 tablet 0  . tiZANidine (ZANAFLEX) 2 MG tablet Take 1 tablet (2 mg total) by mouth at bedtime. 30 tablet 3  . traMADol (ULTRAM) 50 MG tablet TAKE (1) TABLET EVERY SIX HOURS AS NEEDED FOR PAIN. 10 tablet 0  . vitamin B-12 (CYANOCOBALAMIN) 1000 MCG tablet Take 1 tablet (1,000 mcg total) by mouth daily. 30 tablet 0   No current facility-administered medications on file prior to visit.     Allergies:  Allergies  Allergen Reactions  . Lipitor [Atorvastatin Calcium] Other (See Comments)    Leg weakness,memory loss  . Lovastatin Other (See Comments)    Leg weakness  . Celebrex [Celecoxib] Itching and Rash  . Latex Rash  . Macrobid [employer] Macro] Other (See Comments)    Unknown per patient  . Other Other (See Comments)    Always pads     Review of Systems:    CONSTITUTIONAL: No fevers, chills, night sweats, or weight loss.   EYES: No visual changes or eye pain ENT: No hearing changes.  No history of nose bleeds. RESPIRATORY: No cough, wheezing +shortness of breath.   CARDIOVASCULAR: Negative for chest pain, and palpitations.   GI: Negative for abdominal discomfort, blood in stools or black stools.  No recent change in bowel habits.  GU:  No history of incontinence.   MUSCLOSKELETAL: +history of joint pain or swelling.  +myalgias.  + back pain SKIN: Negative for lesions, rash, and itching.   HEMATOLOGY/ONCOLOGY: Negative for prolonged bleeding, bruising easily, and swollen nodes.   ENDOCRINE: Negative for cold or heat intolerance, polydipsia or goiter.   PSYCH:  +depression or anxiety symptoms.   NEURO: As Above.   Vital Signs:  BP 104/60   Pulse 91   Ht 5' (1.524 m)   Wt 165 lb 8  oz (75.1 kg)   SpO2 91%   BMI 32.32 kg/m   General:  She appears very tired today, sitting comfortably in chair.  She was seen in an exam room closer to the lobby today as she was unable to walk all the way to the usual exam room  Neurological Exam: MENTAL STATUS including orientation to time, place, person, recent and remote memory is good. There is word-findings difficult and she quickly forgets details in mid-conversation.  Speech sounds hoarse and soft, intermittently mildly dysarthric, stable. Severely blunted affect.   CRANIAL NERVES:  Pupils round and reactive to light. There is mild flattening of the right nasolabial fold.   MOTOR:  Motor strength is 5/5 in proximal muscles. Tone is normal throughout, no resting tremor  COORDINATION/GAIT:  Finger tapping is improved.  Gait is stooped, slow and steady with walker.   Data: EMG 01/15/2013:  Taken together, these findings are consistent with a moderate generalized, large fiber, sensorimotor polyneuropathy affecting the left side. There is a superimposed intraspinal canal lesion (i.e. radiculopathy)  affecting the L2-L4 nerve root/segments bilaterally, moderate in degree electrically.  MRI/A Brain 07/03/2014: 1. Acute nonhemorrhagic 21 mm white matter infarct of the posterior right frontal lobe. 2. Extensive atrophy and diffuse white matter disease corresponds to advanced chronic microvascular ischemia. 3. Moderate to high-grade stenosis of the infra posterior right M2 branch. 4. Moderate small vessel disease is evident on the MRA. 5. High-grade stenosis in the proximal left P2 segment.  MRI/A brain wo contrast 04/27/2016: 1. Subcentimeter acute/early subacute infarct within the left frontal centrum semiovale. No acute hemorrhage. 2. Stable advanced chronic microvascular ischemic changes and moderate volume loss of the brain. 3. No evidence of large vessel occlusion, or aneurysm of the circle of Willis. Intracranial atherosclerosis with short segments of stenosis most pronounced in the left posterior cerebral artery.  US carotids 04/29/2016:  1-39% ICA stenosis bilaterally TTE 04/29/2016:  EF 55-60%, no thrombus  IMPRESSION/PLAN:   1.  Generalized deconditioning to due multiple comorbidities (vascular parkinsonism, peripheral neuropathy, osteoarthritis and advanced age).  Since her stroke in February, she unfortunately, has not recovered to her prior baseline and has required 24 hour caregiver support.  Given her progressive neurodegenerative conditions, I anticipate that she will continue to slowly deteriorate.  I do not see that adding additional medication for Parkinson's, headaches, or neuropathy is worth the potential side effects so will try to optimize what she already has and try to use non-pharmacological therapies.  Goal of care is to offer quality of life  2.  Vascular parkinsonism, REM behavior disorder, Lewy body dementia  - worsening  - Continue sinemet 25/100 1 tab TID  3.  Subacute R corona radiata due to small vessel disease (Jan 2018) with residual mild right flattening of the  nasolabial fold and dysarthria.  - Continue plavix 75mg  daily as monotherapy.    - Secondary risk factors are well-controlled, no diabetes or hypertension, LDL 57 on pravastatin 20mg .   4.  Chronic tension headaches - worsening  - Continue nortriptyline to 50mg .   Last QTc is 446, do not titrate any higher  - Increase tizanidine to 2mg  twice daily  - Recommend home exercises for neck physiotherapy and soft tissue massage  6.  Peripheral neuropathy and gait instability   - Continue nortriptyline to 50mg  qhs   - Recommend using a wheelchair instead of rollator for long distances  Return to clinic in 2-3 months   Greater than 50% of this 30 minute  visit was spent in counseling, explanation of diagnosis, planning of further management, and coordination of care due to the progression of her neurodegenerative condition.    Thank you for allowing me to participate in patient's care.  If I can answer any additional questions, I would be pleased to do so.    Sincerely,    Cathalina Barcia K. Posey Pronto, DO

## 2016-11-30 ENCOUNTER — Other Ambulatory Visit: Payer: Self-pay | Admitting: Family

## 2016-11-30 NOTE — Telephone Encounter (Signed)
Last refill was 10/30/16 per [facility]

## 2016-11-30 NOTE — Telephone Encounter (Signed)
Faxed

## 2016-12-06 ENCOUNTER — Other Ambulatory Visit: Payer: Self-pay | Admitting: Internal Medicine

## 2016-12-07 NOTE — Telephone Encounter (Signed)
Not sure what patient is taking this for is it appropriate to refill?  Please advise. thanks

## 2016-12-08 DIAGNOSIS — L820 Inflamed seborrheic keratosis: Secondary | ICD-10-CM | POA: Diagnosis not present

## 2016-12-08 DIAGNOSIS — D692 Other nonthrombocytopenic purpura: Secondary | ICD-10-CM | POA: Diagnosis not present

## 2016-12-08 DIAGNOSIS — L853 Xerosis cutis: Secondary | ICD-10-CM | POA: Diagnosis not present

## 2016-12-14 ENCOUNTER — Other Ambulatory Visit: Payer: Self-pay | Admitting: Family

## 2016-12-15 NOTE — Telephone Encounter (Signed)
Faxed to [facility] 

## 2016-12-20 ENCOUNTER — Other Ambulatory Visit: Payer: Self-pay | Admitting: Internal Medicine

## 2016-12-20 ENCOUNTER — Telehealth: Payer: Self-pay | Admitting: Internal Medicine

## 2016-12-20 ENCOUNTER — Telehealth: Payer: Self-pay | Admitting: Neurology

## 2016-12-20 NOTE — Telephone Encounter (Signed)
I spoke with [name] and she said that patient's foot pain comes and goes at night.  Instructed her to have the night care givers continue to put the cream on her feet.  She said that tylenol helps also and will continue that too.

## 2016-12-20 NOTE — Telephone Encounter (Signed)
Sarah [name] called in regards to pt and would like a call back regarding her foot pain

## 2016-12-20 NOTE — Telephone Encounter (Signed)
I would not recommend making this change. Having lower levels of thyroid medicine can cause or worsen weight gain not help.

## 2016-12-20 NOTE — Telephone Encounter (Signed)
Pt's caregiver called stating that the pt would like to change or cut her thyroid medication in half. She said that it is causing her to gain weight.  They would also like to know if she needs to come in for a visit with Dr Sharlet Salina. Please advise.

## 2016-12-20 NOTE — Telephone Encounter (Signed)
Contacted care giver and informed he to not cut the medication in half. Caregiver states the last two days she has been taking half and she will let the patient know tomorrow that it is not recommended and to continue taking the whole pill.

## 2016-12-28 ENCOUNTER — Telehealth: Payer: Self-pay | Admitting: Emergency Medicine

## 2016-12-28 NOTE — Telephone Encounter (Signed)
Patient called and stated she needed to speak to you and asked that you return here call. Wouldn't give me anymore information. Thanks.

## 2016-12-29 ENCOUNTER — Other Ambulatory Visit: Payer: Self-pay | Admitting: Family

## 2016-12-29 NOTE — Telephone Encounter (Signed)
Patient states she thought Lesly Rubenstein called her back.  Requesting call back.  States thyroid medication is causing her to gain weight really fast and she would like to be prescribed a different medication.  Patient states she has gained 7 pounds in 6 weeks.

## 2016-12-29 NOTE — Telephone Encounter (Signed)
Patient states that she believes that her thyroid medication is causing her to gain weight. I had repeated to her that you do not advise halving the medication as that would cause her to gain more weight. Patient is now wondering if there is any other medication that she could take that would not cause her to gain the weight.

## 2016-12-30 DIAGNOSIS — L820 Inflamed seborrheic keratosis: Secondary | ICD-10-CM | POA: Diagnosis not present

## 2016-12-30 NOTE — Telephone Encounter (Signed)
We can discuss at visit but weight gain can be caused by many things.

## 2016-12-30 NOTE — Telephone Encounter (Signed)
LVM for patient to call back and set up an appointment to be seen about weight gain.

## 2017-01-04 ENCOUNTER — Ambulatory Visit (INDEPENDENT_AMBULATORY_CARE_PROVIDER_SITE_OTHER): Payer: Medicare Other | Admitting: Internal Medicine

## 2017-01-04 ENCOUNTER — Other Ambulatory Visit (INDEPENDENT_AMBULATORY_CARE_PROVIDER_SITE_OTHER): Payer: Medicare Other

## 2017-01-04 ENCOUNTER — Encounter: Payer: Self-pay | Admitting: Internal Medicine

## 2017-01-04 VITALS — BP 130/68 | HR 88 | Temp 98.0°F | Ht 60.0 in | Wt 167.0 lb

## 2017-01-04 DIAGNOSIS — I63511 Cerebral infarction due to unspecified occlusion or stenosis of right middle cerebral artery: Secondary | ICD-10-CM | POA: Diagnosis not present

## 2017-01-04 DIAGNOSIS — E559 Vitamin D deficiency, unspecified: Secondary | ICD-10-CM

## 2017-01-04 DIAGNOSIS — E039 Hypothyroidism, unspecified: Secondary | ICD-10-CM

## 2017-01-04 DIAGNOSIS — Z23 Encounter for immunization: Secondary | ICD-10-CM

## 2017-01-04 DIAGNOSIS — E538 Deficiency of other specified B group vitamins: Secondary | ICD-10-CM | POA: Diagnosis not present

## 2017-01-04 LAB — COMPREHENSIVE METABOLIC PANEL
ALK PHOS: 47 U/L (ref 39–117)
ALT: 7 U/L (ref 0–35)
AST: 5 U/L (ref 0–37)
Albumin: 3.9 g/dL (ref 3.5–5.2)
BILIRUBIN TOTAL: 0.3 mg/dL (ref 0.2–1.2)
BUN: 16 mg/dL (ref 6–23)
CO2: 28 meq/L (ref 19–32)
Calcium: 9.2 mg/dL (ref 8.4–10.5)
Chloride: 96 mEq/L (ref 96–112)
Creatinine, Ser: 0.8 mg/dL (ref 0.40–1.20)
GFR: 71.89 mL/min (ref >60.00)
GLUCOSE: 91 mg/dL (ref 70–99)
Potassium: 4.8 mEq/L (ref 3.5–5.1)
SODIUM: 131 meq/L — AB (ref 135–145)
TOTAL PROTEIN: 6.6 g/dL (ref 6.0–8.3)

## 2017-01-04 LAB — TSH: TSH: 3.66 u[IU]/mL (ref 0.35–4.50)

## 2017-01-04 LAB — T4, FREE: FREE T4: 0.93 ng/dL (ref 0.60–1.60)

## 2017-01-04 LAB — VITAMIN D 25 HYDROXY (VIT D DEFICIENCY, FRACTURES): VITD: 47.34 ng/mL (ref 30.00–100.00)

## 2017-01-04 MED ORDER — CYANOCOBALAMIN 1000 MCG/ML IJ SOLN
1000.0000 ug | Freq: Once | INTRAMUSCULAR | Status: AC
  Administered 2017-01-04: 1000 ug via INTRAMUSCULAR

## 2017-01-04 MED ORDER — FUROSEMIDE 20 MG PO TABS: 20.0000 mg | ORAL_TABLET | Freq: Every day | ORAL | 0 refills | Status: AC | PRN

## 2017-01-04 NOTE — Progress Notes (Signed)
   Subjective:    Patient ID: Sarah [name], female    DOB: 03/25/1927, 83 y.o.   MRN: [id number]  HPI The patient is an 83 YO female coming in for weight concerns. She was put on thyroid medication and thinks that it is causing her to gain weight. She wants to stop the medicine. She is wanting to know how thyroid works. She is also having lack of energy and is concerned that something is wrong to cause this. She is napping some more. Denies fevers, chills. She is eating a little bit better lately and this has caused her to lose a couple of pounds. She denies headaches, chest pains, SOB.   Review of Systems  Constitutional: Positive for appetite change and unexpected weight change. Negative for activity change, chills, diaphoresis and fatigue.  Respiratory: Negative for cough, chest tightness and shortness of breath.   Cardiovascular: Negative for chest pain, palpitations and leg swelling.  Gastrointestinal: Negative for abdominal distention, abdominal pain, constipation, diarrhea, nausea and vomiting.  Musculoskeletal: Negative.   Skin: Negative.   Neurological: Negative.   Psychiatric/Behavioral: Negative.       Objective:   Physical Exam  Constitutional: She is oriented to person, place, and time. She appears well-developed and well-nourished.  overweight  HENT:  Head: Normocephalic and atraumatic.  Eyes: EOM are normal.  Neck: Normal range of motion.  Cardiovascular: Normal rate and regular rhythm.   Pulmonary/Chest: Effort normal and breath sounds normal. No respiratory distress. She has no wheezes. She has no rales.  Abdominal: Soft. She exhibits no distension. There is no tenderness. There is no rebound.  Musculoskeletal: She exhibits no edema.  Neurological: She is alert and oriented to person, place, and time. Coordination abnormal.  Walker for ambulation  Skin: Skin is warm and dry.  Psychiatric: She has a normal mood and affect.   Vitals:   01/04/17 1509  BP: 130/68    Pulse: 88  Temp: 98 F (36.7 C)  TempSrc: Oral  SpO2: 100%  Weight: 167 lb (75.8 kg)  Height: 5' (1.524 m)      Assessment & Plan:  B12 and flu shot given at visit

## 2017-01-04 NOTE — Patient Instructions (Signed)
Capsaicin cream can help with the pain in the feet if needed.   We are checking the labs today and have given you the flu shot and B12 shot.

## 2017-01-05 DIAGNOSIS — E039 Hypothyroidism, unspecified: Secondary | ICD-10-CM | POA: Insufficient documentation

## 2017-01-05 NOTE — Assessment & Plan Note (Signed)
B12 given at visit.

## 2017-01-05 NOTE — Assessment & Plan Note (Signed)
Taking synthroid 25 mcg daily and checking TSH and free T4. Explained to her that thyroid medication does not cause her to gain weight.

## 2017-01-06 ENCOUNTER — Telehealth: Payer: Self-pay

## 2017-01-06 ENCOUNTER — Telehealth: Payer: Self-pay | Admitting: Internal Medicine

## 2017-01-06 NOTE — Telephone Encounter (Signed)
Pt would like a call regarding her lab results.

## 2017-01-06 NOTE — Telephone Encounter (Signed)
Patient states she saw something on the tv about medication trebigin OTC which is supposed to help with memory and wants to know if you know anything about this medication

## 2017-01-06 NOTE — Telephone Encounter (Signed)
See result note.  

## 2017-01-07 ENCOUNTER — Other Ambulatory Visit: Payer: Self-pay | Admitting: Internal Medicine

## 2017-01-07 NOTE — Telephone Encounter (Signed)
I have not heard of this. Generally nutritional supplements do not have good evidence behind them but are typically not harmful to try.

## 2017-01-10 NOTE — Telephone Encounter (Signed)
Patient called and informed. Patient stated understanding

## 2017-01-11 NOTE — Telephone Encounter (Signed)
Faxed to [facility]

## 2017-01-12 ENCOUNTER — Ambulatory Visit: Payer: Medicare Other | Admitting: Family Medicine

## 2017-01-13 ENCOUNTER — Ambulatory Visit (INDEPENDENT_AMBULATORY_CARE_PROVIDER_SITE_OTHER): Payer: Medicare Other | Admitting: Family Medicine

## 2017-01-13 ENCOUNTER — Encounter: Payer: Self-pay | Admitting: Family Medicine

## 2017-01-13 DIAGNOSIS — M4698 Unspecified inflammatory spondylopathy, sacral and sacrococcygeal region: Secondary | ICD-10-CM | POA: Diagnosis not present

## 2017-01-13 DIAGNOSIS — E039 Hypothyroidism, unspecified: Secondary | ICD-10-CM

## 2017-01-13 DIAGNOSIS — I63511 Cerebral infarction due to unspecified occlusion or stenosis of right middle cerebral artery: Secondary | ICD-10-CM | POA: Diagnosis not present

## 2017-01-13 DIAGNOSIS — M170 Bilateral primary osteoarthritis of knee: Secondary | ICD-10-CM

## 2017-01-13 DIAGNOSIS — M47818 Spondylosis without myelopathy or radiculopathy, sacral and sacrococcygeal region: Secondary | ICD-10-CM

## 2017-01-13 MED ORDER — LEVOTHYROXINE SODIUM 50 MCG PO TABS: 50.0000 ug | ORAL_TABLET | Freq: Every day | ORAL | 3 refills | Status: DC

## 2017-01-13 NOTE — Assessment & Plan Note (Signed)
Repeat injection given today. Tolerated procedure well. Discussed icing regimen and home exercises. Discussed objective is a doing which ones to avoid.

## 2017-01-13 NOTE — Assessment & Plan Note (Signed)
Right knee injected 01/13/2017. Tolerated procedure well. Following up in 3 weeks for possibly bilateral lateral injection. Patient is not a surgical candidate with her other comorbidities.

## 2017-01-13 NOTE — Progress Notes (Addendum)
[name] [address], [place] [address] Phone: [phone] Subjective:      CC: Right knee pain, SI joint pain,  EGB:[id number]  Sarah [name] is a 83 y.o. female coming in with complaint of  Right knee pain. Past medical history significant for Parkinson's. Has had difficulty overall. Noticing that now having more difficulty walking. Patient has indurated with the aid of a walker for a long time. Patient feels that she is having increasing instability and is concerned and she will be falling in the near future.  Patient also has sacroiliac arthritis. Has responded well to injections in the past. Looking for another one at this time.   She recently had labs and was found to be hypothyroid. Seem to get worse with a 25 g. Patient's TSH went from 5 to 6.    Past Medical History:  Diagnosis Date  . Bladder incontinence   . Chest pain    S/P nuclear 07/17/2008  normal; EF 88% with no ischemia  . Chronic venous insufficiency    BLE edema, chronic, neg venous US 2/13  . Constipation    hx of  . Depression   . Diverticulitis   . Esophageal stricture    s/p dilation 04/2012  . GERD (gastroesophageal reflux disease)   . History of chicken pox   . Hyperlipidemia   . Hypertension   . Osteoarthritis   . Osteoporosis, postmenopausal 06/02/2011  . Polyp of colon   . Stroke [facility]) 07/03/2014   Past Surgical History:  Procedure Laterality Date  . APPENDECTOMY     age 74  . CATARACT EXTRACTION, BILATERAL Bilateral 11/28/2015  . COLONOSCOPY    . POLYPECTOMY    . TUBAL LIGATION    . UPPER GASTROINTESTINAL ENDOSCOPY     Social History   Social History  . Marital status: Widowed    Spouse name: N/A  . Number of children: 2  . Years of education: N/A   Social History Main Topics  . Smoking status: Never Smoker  . Smokeless tobacco: Never Used  . Alcohol use No  . Drug use: No  . Sexual activity: No   Other Topics Concern  . None    Social History Narrative   She lives alone in [place] home, but stays on the first floor.  Has 2 children.   Allergies  Allergen Reactions  . Lipitor [Atorvastatin Calcium] Other (See Comments)    Leg weakness,memory loss  . Lovastatin Other (See Comments)    Leg weakness  . Celebrex [Celecoxib] Itching and Rash  . Latex Rash  . Macrobid [employer] Macro] Other (See Comments)    Unknown per patient  . Other Other (See Comments)    Always pads   Family History  Problem Relation Age of Onset  . Cancer Son        testicular cancer  . Breast cancer Sister   . Skin cancer Sister   . Colon cancer Neg Hx   . Esophageal cancer Neg Hx   . Rectal cancer Neg Hx   . Stomach cancer Neg Hx      Past medical history, social, surgical and family history all reviewed in electronic medical record.  No pertanent information unless stated regarding to the chief complaint.   Review of Systems:Review of systems updated and as accurate as of 01/13/17  No headache, visual changes, nausea, vomiting, diarrhea, constipation, dizziness, abdominal pain, skin rash, fevers, chills, night sweats, weight loss, swollen  lymph nodes,joint swelling, chest pain, shortness of breath, mood changes. Positive body aches, muscle aches  Objective  Blood pressure 118/66, pulse 87, height 5' (1.524 m), weight 169 lb (76.7 kg), SpO2 97 %. Systems examined below as of 01/13/17   General: No apparent distress alert and oriented x3 mood and affect normal, dressed appropriately. Masked facies  HEENT: Pupils equal, extraocular movements intact  Respiratory: Patient's speak in full sentences and does not appear short of breath  Cardiovascular: 2+ lower extremity edema, non tender, no erythema  Skin: Warm dry intact with no signs of infection or rash on extremities or on axial skeleton.  Abdomen: Soft nontender  Neuro: Cranial nerves II through XII are intact, neurovascularly intact in all extremities with 2+  DTRs and 2+ pulses.  Lymph: No lymphadenopathy of posterior or anterior cervical chain or axillae bilaterally.  Gait Shuffling gait with a walker MSK:  Mild tender with full range of motion and good stability and symmetric strength and tone of shoulders, elbows, wrist, hip, and ankles bilaterally. Arthritic changes of multiple joints  Knee: Bilateral valgus deformity noted. Large thigh to calf ratio.  Tender to palpation over medial and PF joint line.  ROM full in flexion and extension and lower leg rotation. instability with valgus force.  painful patellar compression. Patellar glide with moderate crepitus. Patellar and quadriceps tendons unremarkable. Hamstring and quadriceps strength is normal.   Back Exam:  Inspection: Degenerative scoliosis Motion: Flexion 25 deg, Extension 15 deg, Side Bending to 25 deg bilaterally,  Rotation to 25 deg bilaterally  SLR laying: Negative  XSLR laying: Negative  Palpable tenderness: More tenderness over the sacroiliac joints bilaterally. Worse over the left. FABER: Unable to do secondary to tightness and cogwheeling. Sensory change: Gross sensation intact to all lumbar and sacral dermatomes.  Reflexes: 2+ at both patellar tendons, 2+ at achilles tendons, Babinski's downgoing.  Strength at foot  3+ out of 5 with cogwheeling and mild rigidity  Procedure: Real-time Ultrasound Guided Injection of left sacroiliac joint Device: GE Logiq Q7 Ultrasound guided injection is preferred based studies that show increased duration, increased effect, greater accuracy, decreased procedural pain, increased response rate, and decreased cost with ultrasound guided versus blind injection.  Verbal informed consent obtained.  Time-out conducted.  Noted no overlying erythema, induration, or other signs of local infection.  Skin prepped in a sterile fashion.  Local anesthesia: Topical Ethyl chloride.  With sterile technique and under real time ultrasound guidance:   With a 21-gauge 2 inch needle patient was injected with a total of 0.5 mL of 0.5% Marcaine and 1 mL of Kenalog 40 g/dL Completed without difficulty  Pain immediately resolved suggesting accurate placement of the medication.  Advised to call if fevers/chills, erythema, induration, drainage, or persistent bleeding.  Images permanently stored and available for review in the ultrasound unit.  Impression: Technically successful ultrasound guided injection.   After informed written and verbal consent, patient was seated on exam table. Right knee was prepped with alcohol swab and utilizing anterolateral approach, patient's right knee space was injected with 4:1  marcaine 0.5%: Kenalog 40mg /dL. Patient tolerated the procedure well without immediate complications.  Impression and Recommendations:     This case required medical decision making of moderate complexity.      Note: This dictation was prepared with Dragon dictation along with smaller phrase technology. Any transcriptional errors that result from this process are unintentional.

## 2017-01-13 NOTE — Patient Instructions (Signed)
Good to see you  Levothyroxine 86mcg daily should help a lot.  Continue the water pil  Follow up with your regular doctor See em again in 2-3 weeks and we will inject the other knee   Hypothyroidism Hypothyroidism is a disorder of the thyroid. The thyroid is a large gland that is located in the lower front of the neck. The thyroid releases hormones that control how the body works. With hypothyroidism, the thyroid does not make enough of these hormones. What are the causes? Causes of hypothyroidism may include:  Viral infections.  Pregnancy.  Your own defense system (immune system) attacking your thyroid.  Certain medicines.  Birth defects.  Past radiation treatments to your head or neck.  Past treatment with radioactive iodine.  Past surgical removal of part or all of your thyroid.  Problems with the gland that is located in the center of your brain (pituitary).  What are the signs or symptoms? Signs and symptoms of hypothyroidism may include:  Feeling as though you have no energy (lethargy).  Inability to tolerate cold.  Weight gain that is not explained by a change in diet or exercise habits.  Dry skin.  Coarse hair.  Menstrual irregularity.  Slowing of thought processes.  Constipation.  Sadness or depression.  How is this diagnosed? Your health care provider may diagnose hypothyroidism with blood tests and ultrasound tests. How is this treated? Hypothyroidism is treated with medicine that replaces the hormones that your body does not make. After you begin treatment, it may take several weeks for symptoms to go away. Follow these instructions at home:  Take medicines only as directed by your health care provider.  If you start taking any new medicines, tell your health care provider.  Keep all follow-up visits as directed by your health care provider. This is important. As your condition improves, your dosage needs may change. You will need to have blood  tests regularly so that your health care provider can watch your condition. Contact a health care provider if:  Your symptoms do not get better with treatment.  You are taking thyroid replacement medicine and: ? You sweat excessively. ? You have tremors. ? You feel anxious. ? You lose weight rapidly. ? You cannot tolerate heat. ? You have emotional swings. ? You have diarrhea. ? You feel weak. Get help right away if:  You develop chest pain.  You develop an irregular heartbeat.  You develop a rapid heartbeat. This information is not intended to replace advice given to you by your health care provider. Make sure you discuss any questions you have with your health care provider. Document Released: 03/15/2005 Document Revised: 08/21/2015 Document Reviewed: 07/31/2013 Elsevier Interactive Patient Education  2017 [employer].

## 2017-01-13 NOTE — Assessment & Plan Note (Signed)
Increased levothyroxine 50 g

## 2017-01-21 ENCOUNTER — Ambulatory Visit: Payer: Medicare Other | Admitting: Internal Medicine

## 2017-01-24 ENCOUNTER — Telehealth: Payer: Self-pay | Admitting: Internal Medicine

## 2017-01-24 ENCOUNTER — Encounter: Payer: Self-pay | Admitting: Internal Medicine

## 2017-01-24 ENCOUNTER — Ambulatory Visit (INDEPENDENT_AMBULATORY_CARE_PROVIDER_SITE_OTHER): Payer: Medicare Other | Admitting: Internal Medicine

## 2017-01-24 VITALS — BP 124/72 | HR 85 | Temp 97.7°F | Ht 60.0 in | Wt 164.0 lb

## 2017-01-24 DIAGNOSIS — I63511 Cerebral infarction due to unspecified occlusion or stenosis of right middle cerebral artery: Secondary | ICD-10-CM

## 2017-01-24 DIAGNOSIS — E039 Hypothyroidism, unspecified: Secondary | ICD-10-CM

## 2017-01-24 NOTE — Patient Instructions (Signed)
Come back in about 3-4 weeks to get the blood work done in the basement for the thyroid.   You have allergies causing the problems with the ears.

## 2017-01-24 NOTE — Assessment & Plan Note (Signed)
Dosage increased of her synthroid to see if her symptoms got better. She has not let enough time pass to get an accurate reading. Orders placed today for TSH and free T4 for 3-4 weeks from now. If no change in symptoms will likely decrease back to 25 mcg daily as TSH was at goal there.

## 2017-01-24 NOTE — Telephone Encounter (Signed)
Pt called and states she forgot to tell Dr. Sharlet Salina about one of her issues, her right hip is jutting out and causing her to be uncomfortable and her pant leg to be short, she would like a call back in regard

## 2017-01-24 NOTE — Progress Notes (Signed)
   Subjective:    Patient ID: Sarah [name], female    DOB: 1926-09-27, 83 y.o.   MRN: [id number]  HPI The patient is an 83 YO female coming in for follow up of her thyroid. She has started taking the 50 mcg daily synthroid for the last 1-2 weeks. No real change in energy. She is still having good and bad days with her energy. She is able to get around the house okay with her walker. She is taking fluid pill only when her weight is increasing.   Review of Systems  Constitutional: Positive for fatigue. Negative for activity change, appetite change, chills, fever and unexpected weight change.  HENT: Negative.   Eyes: Negative.   Respiratory: Negative for cough, chest tightness and shortness of breath.   Cardiovascular: Negative.   Gastrointestinal: Negative.   Musculoskeletal: Positive for arthralgias and gait problem.  Skin: Negative.   Neurological: Positive for weakness. Negative for dizziness, facial asymmetry and headaches.      Objective:   Physical Exam  Constitutional: She is oriented to person, place, and time. She appears well-developed and well-nourished.  HENT:  Head: Normocephalic and atraumatic.  Eyes: EOM are normal.  Cardiovascular: Normal rate and regular rhythm.   Pulmonary/Chest: Effort normal. No respiratory distress. She has no wheezes.  Abdominal: Soft. Bowel sounds are normal. She exhibits no distension. There is no tenderness. There is no rebound.  Musculoskeletal: She exhibits no edema.  Neurological: She is alert and oriented to person, place, and time. Coordination abnormal.  Walker with seat to support ambulation, slow gait  Skin: Skin is warm and dry.   Vitals:   01/24/17 1347  BP: 124/72  Pulse: 85  Temp: 97.7 F (36.5 C)  TempSrc: Oral  SpO2: 97%  Weight: 164 lb (74.4 kg)  Height: 5' (1.524 m)      Assessment & Plan:

## 2017-01-25 NOTE — Telephone Encounter (Signed)
Spoke to with [name] the care giver about her hip. [name] states infomred me that this is an on going problem and that patient forgets that she has already talked to [name] about it. [name] states she will relay the information about the tylenol when patient wakes up

## 2017-01-25 NOTE — Telephone Encounter (Signed)
We will not be able to change her hip. She can try tylenol for pain if needed.

## 2017-01-26 ENCOUNTER — Other Ambulatory Visit: Payer: Self-pay | Admitting: Internal Medicine

## 2017-01-27 ENCOUNTER — Telehealth: Payer: Self-pay | Admitting: *Deleted

## 2017-01-27 NOTE — Telephone Encounter (Signed)
Recommend stopping all topical ointments, as this may be causing irritation. She can apply aloe vera to sooth the skin.  Ok to take tylenol and Advil for pain.  Mechel Haggard K. Posey Pronto, DO

## 2017-01-27 NOTE — Telephone Encounter (Signed)
Sarah [name] (patient's daughter) called to let us know that patient has been up several times a night due to foot pain.  She said that her feet are red and the skin seems thin.  G[place] does not work and they are worried that the voltaren is burning her feet.  Anything else to try?  Donna's husband said that his doctor said they could try 2 advil and 1 extra strength tylenol at bedtime.    Sarah [name] called back this morning and said that the 2 advil and 1 extra strength tylenol worked on patient last night.  She did not need anything on her feet until this morning.

## 2017-01-27 NOTE — Telephone Encounter (Signed)
Patient's daughter given instructions and agreed with plan.

## 2017-01-31 ENCOUNTER — Telehealth: Payer: Self-pay | Admitting: Neurology

## 2017-01-31 NOTE — Telephone Encounter (Signed)
Faxed to [facility]

## 2017-01-31 NOTE — Telephone Encounter (Signed)
Sarah [name] called and wants to know if its ok for pt to continue taking the aspirin at night for neuropathy

## 2017-01-31 NOTE — Telephone Encounter (Signed)
Left message for Sarah [name] that patient should be able to continue all medications as she has been taking them.

## 2017-01-31 NOTE — Telephone Encounter (Signed)
[name] called in regards to pt and has questions regarding the aspirin she is to take, please call and advise

## 2017-02-01 ENCOUNTER — Telehealth: Payer: Self-pay | Admitting: Neurology

## 2017-02-01 NOTE — Telephone Encounter (Signed)
Sarah [name] called needing to speak with you regarding [place]. She said she will be leaving today at 4:00. Please Call. Thanks

## 2017-02-01 NOTE — Telephone Encounter (Signed)
I spoke with Sarah [name] and patient and they both said that she is not feeling herself lately and has no energy. Informed them that I don't think that it is any of her medicines and that she may just be feeling depressed and tired.  She agreed to try and do what she can but will also take it easy.  She will call if she needs anything.

## 2017-02-02 ENCOUNTER — Ambulatory Visit: Payer: Medicare Other | Admitting: Family Medicine

## 2017-02-14 ENCOUNTER — Other Ambulatory Visit: Payer: Self-pay | Admitting: Internal Medicine

## 2017-02-15 ENCOUNTER — Telehealth: Payer: Self-pay | Admitting: Internal Medicine

## 2017-02-15 ENCOUNTER — Other Ambulatory Visit: Payer: Self-pay | Admitting: *Deleted

## 2017-02-15 MED ORDER — TRIAZOLAM 0.25 MG PO TABS: 0.1250 mg | ORAL_TABLET | Freq: Every evening | ORAL | 0 refills | Status: DC | PRN

## 2017-02-15 MED ORDER — DICLOFENAC SODIUM 1 % TD GEL: TRANSDERMAL | 3 refills | Status: AC

## 2017-02-15 NOTE — Telephone Encounter (Signed)
Notified pt daughter MD sent refill. She also states mom is needing renewal on the Diclofenac gel [facility] should be send request to have fill. inform WILL FOLLOW-UP and send back to pharmacy...[name]

## 2017-02-15 NOTE — Progress Notes (Signed)
Pt requesting refill on her diclofenac gel to be sent to [facility]...[name]

## 2017-02-15 NOTE — Telephone Encounter (Signed)
Sent in

## 2017-02-15 NOTE — Telephone Encounter (Signed)
Pt daughter called for a refill of her  HALCION 0.25 MG tablet  Please advise

## 2017-02-15 NOTE — Telephone Encounter (Signed)
Check [place] registry last filled 01/17/2017...[name]

## 2017-02-19 ENCOUNTER — Other Ambulatory Visit: Payer: Self-pay | Admitting: Cardiology

## 2017-02-23 ENCOUNTER — Ambulatory Visit (INDEPENDENT_AMBULATORY_CARE_PROVIDER_SITE_OTHER): Payer: Medicare Other | Admitting: Internal Medicine

## 2017-02-23 ENCOUNTER — Encounter: Payer: Self-pay | Admitting: Internal Medicine

## 2017-02-23 ENCOUNTER — Other Ambulatory Visit (INDEPENDENT_AMBULATORY_CARE_PROVIDER_SITE_OTHER): Payer: Medicare Other

## 2017-02-23 VITALS — BP 122/68 | HR 84 | Temp 97.7°F | Ht 60.0 in | Wt 156.0 lb

## 2017-02-23 DIAGNOSIS — I63511 Cerebral infarction due to unspecified occlusion or stenosis of right middle cerebral artery: Secondary | ICD-10-CM | POA: Diagnosis not present

## 2017-02-23 DIAGNOSIS — R252 Cramp and spasm: Secondary | ICD-10-CM

## 2017-02-23 DIAGNOSIS — I679 Cerebrovascular disease, unspecified: Secondary | ICD-10-CM | POA: Diagnosis not present

## 2017-02-23 DIAGNOSIS — E039 Hypothyroidism, unspecified: Secondary | ICD-10-CM | POA: Diagnosis not present

## 2017-02-23 LAB — COMPREHENSIVE METABOLIC PANEL
ALBUMIN: 3.7 g/dL (ref 3.5–5.2)
ALK PHOS: 42 U/L (ref 39–117)
ALT: 6 U/L (ref 0–35)
AST: 5 U/L (ref 0–37)
BUN: 20 mg/dL (ref 6–23)
CO2: 28 mEq/L (ref 19–32)
Calcium: 8.9 mg/dL (ref 8.4–10.5)
Chloride: 93 mEq/L — ABNORMAL LOW (ref 96–112)
Creatinine, Ser: 0.76 mg/dL (ref 0.40–1.20)
GFR: 76.25 mL/min (ref >60.00)
Glucose, Bld: 94 mg/dL (ref 70–99)
POTASSIUM: 4.5 meq/L (ref 3.5–5.1)
SODIUM: 127 meq/L — AB (ref 135–145)
TOTAL PROTEIN: 6.3 g/dL (ref 6.0–8.3)
Total Bilirubin: 0.3 mg/dL (ref 0.2–1.2)

## 2017-02-23 LAB — T4, FREE: FREE T4: 1.09 ng/dL (ref 0.60–1.60)

## 2017-02-23 LAB — MAGNESIUM: Magnesium: 2 mg/dL (ref 1.5–2.5)

## 2017-02-23 LAB — TSH: TSH: 2.5 u[IU]/mL (ref 0.35–4.50)

## 2017-02-23 NOTE — Progress Notes (Signed)
   Subjective:    Patient ID: Sarah [name], female    DOB: 08-19-1926, 83 y.o.   MRN: [id number]  HPI The patient is an 83 YO female coming in for problem at her dentist. They took x-rays of her jaw and they saw calcium in the carotid arteries and wanted her to get checked as soon as possible. She has history of stroke but denies new symptoms lately.  Review of Systems  Constitutional: Positive for fatigue.  Respiratory: Negative for cough, chest tightness and shortness of breath.   Cardiovascular: Negative for chest pain, palpitations and leg swelling.  Gastrointestinal: Negative for abdominal distention, abdominal pain, constipation, diarrhea, nausea and vomiting.  Musculoskeletal: Positive for arthralgias.  Skin: Negative.   Neurological: Positive for weakness.      Objective:   Physical Exam  Constitutional: She is oriented to person, place, and time. She appears well-developed and well-nourished.  HENT:  Head: Normocephalic and atraumatic.  Eyes: EOM are normal.  Neck: Normal range of motion.  Cardiovascular: Normal rate and regular rhythm.  Pulmonary/Chest: Effort normal and breath sounds normal. No respiratory distress. She has no wheezes. She has no rales.  Abdominal: Soft.  Musculoskeletal: She exhibits no edema.  Neurological: She is alert and oriented to person, place, and time. Coordination abnormal.  Wheeled walker  Skin: Skin is warm and dry.   Vitals:   02/23/17 1112  BP: 122/68  Pulse: 84  Temp: 97.7 F (36.5 C)  TempSrc: Axillary  SpO2: 93%  Weight: 156 lb (70.8 kg)  Height: 5' (1.524 m)      Assessment & Plan:

## 2017-02-23 NOTE — Patient Instructions (Signed)
We are checking the labs today and will call back the results.  We have already checked the carotid arteries in 2016 and January 2018 which did not have any significant blockages. This does not need to be rechecked until 2020.

## 2017-02-24 NOTE — Assessment & Plan Note (Signed)
Calcium in the carotids on x-ray from the dentist. She has had carotid US twice most recently 04/28/16 with mild 1-39% in both arteries. Reassurance given that this is adequately imaged and no further imaging needed at this time.

## 2017-02-25 ENCOUNTER — Ambulatory Visit: Payer: Self-pay | Admitting: *Deleted

## 2017-02-25 ENCOUNTER — Telehealth: Payer: Self-pay | Admitting: Internal Medicine

## 2017-02-25 DIAGNOSIS — R300 Dysuria: Secondary | ICD-10-CM

## 2017-02-25 NOTE — Telephone Encounter (Signed)
  Reason for Disposition . Urinating more frequently than usual (i.e., frequency)  Answer Assessment - Initial Assessment Questions 1. SYMPTOM: "What's the main symptom you're concerned about?" (e.g., frequency, incontinence)     Frequency 2. ONSET: "When did the  ________  start?"     A couple of days 3. PAIN: "Is there any pain?" If so, ask: "How bad is it?" (Scale: 1-10; mild, moderate, severe)     Initially had burning but pt states she does not have any at this time 4. CAUSE: "What do you think is causing the symptoms?"     UTI 5. OTHER SYMPTOMS: "Do you have any other symptoms?" (e.g., fever, flank pain, blood in urine, pain with urination)     Did have burning initially, but does have any this morning.  Protocols used: URINARY [facility]  Pt's caregiver, [name] states she noticed that the pt had foul smelling urine and was going to the bathroom more frequently for the past couple of days. Pt initially had complaints of burning with urination, but states she has not had any today. Caregiver sates that Azo test strips were purchased and came back positive, and Azo meds were purchased for OTC treatment. Caregiver has also been encouraging the pt to drink more water and cranberry juice. Caregiver wanting to know if something to treat UTI could be called in or if pt would have to come in for another appt.

## 2017-02-25 NOTE — Telephone Encounter (Signed)
Patient informed by Sarah [name] to come in for a urinalysis. Caregiver states will continue over the counter medications and come Monday to lab.

## 2017-02-25 NOTE — Telephone Encounter (Signed)
Needs U/A, order placed for the lab downstairs. Continue increased water intake in the meantime.

## 2017-02-25 NOTE — Telephone Encounter (Signed)
Copied from [place] [id number]. Topic: Quick Communication - See Telephone Encounter >> Feb 25, 2017  8:16 AM Synthia Innocent wrote: CRM for notification. See Telephone encounter for:  Caregiver, [name] calling stating that patient has UTI, can something be called in? 02/25/17.

## 2017-02-25 NOTE — Telephone Encounter (Signed)
See triage note.

## 2017-02-25 NOTE — Telephone Encounter (Signed)
Please advise 

## 2017-02-25 NOTE — Telephone Encounter (Signed)
What symptoms is she having? Pain, discoloration, urgency?

## 2017-02-25 NOTE — Telephone Encounter (Signed)
Rtia, pt's caregiver called again to see if medication could be called in for the pt. Pt is having urinary frequency, burning and foul smelling urine.

## 2017-02-25 NOTE — Addendum Note (Signed)
Addended by: Pricilla Holm A on: 02/25/2017 03:53 PM   Modules accepted: Orders

## 2017-02-25 NOTE — Telephone Encounter (Signed)
Spoke to [place] and gave response from other phone note, 02/25/2017, [facility] note,  Sarah [name] expressed understanding and they will continue OTC meds this weekend and come in Monday for UA

## 2017-03-11 ENCOUNTER — Telehealth: Payer: Self-pay | Admitting: Neurology

## 2017-03-11 ENCOUNTER — Telehealth: Payer: Self-pay

## 2017-03-11 NOTE — Telephone Encounter (Signed)
Patient's daughter [name] called and wanted to verify the instructions for the Cream her mom is to use on her feet. She was told originally 1 x at night but Geoffrey is telling her and the care taker 2 x at night. They have also had to move her medications that were by her bed. Her daughter asked could someone call her this afternoon so she can let the caretakers know for sure. Please Advise. Thanks

## 2017-03-11 NOTE — Telephone Encounter (Signed)
Faxed ROI to [facility]

## 2017-03-11 NOTE — Telephone Encounter (Signed)
I called patient back and she said that they are not using the whole amount of diclofenac at one time so she would like to save it and use it later on in the night if she needs it.  Informed her that she can try it.  She will have [name] call me with any questions.

## 2017-03-11 NOTE — Telephone Encounter (Signed)
Pt called and wants a call back from [place], she did not say why

## 2017-03-14 ENCOUNTER — Telehealth: Payer: Self-pay | Admitting: Neurology

## 2017-03-14 NOTE — Telephone Encounter (Signed)
I spoke with [name] and informed her that it was ok for the caregivers to put a little more on patient's feet if needed.

## 2017-03-14 NOTE — Telephone Encounter (Signed)
Patient's daughter calling back to speak with you. Thanks °

## 2017-03-14 NOTE — Telephone Encounter (Signed)
See previous note

## 2017-03-15 ENCOUNTER — Encounter: Payer: Self-pay | Admitting: Neurology

## 2017-03-15 ENCOUNTER — Ambulatory Visit (INDEPENDENT_AMBULATORY_CARE_PROVIDER_SITE_OTHER): Payer: Medicare Other | Admitting: Neurology

## 2017-03-15 VITALS — BP 100/60 | HR 86 | Ht 60.0 in | Wt 157.0 lb

## 2017-03-15 DIAGNOSIS — R5381 Other malaise: Secondary | ICD-10-CM

## 2017-03-15 DIAGNOSIS — G609 Hereditary and idiopathic neuropathy, unspecified: Secondary | ICD-10-CM | POA: Diagnosis not present

## 2017-03-15 DIAGNOSIS — G44221 Chronic tension-type headache, intractable: Secondary | ICD-10-CM | POA: Diagnosis not present

## 2017-03-15 DIAGNOSIS — I63511 Cerebral infarction due to unspecified occlusion or stenosis of right middle cerebral artery: Secondary | ICD-10-CM | POA: Diagnosis not present

## 2017-03-15 DIAGNOSIS — G214 Vascular parkinsonism: Secondary | ICD-10-CM

## 2017-03-15 NOTE — Progress Notes (Signed)
[facility]  Follow-up Visit   Date: 03/15/17   Sarah [name] MRN: [id number] DOB: 1926/05/20   Interim History: Sarah [name] is a 83 y.o. right-handed Caucasian female with history of HTN, HPL, depression, esophageal strictures, right corona radiata stroke ( residual right facial droop, 2018), vascular parkinson's disease, and GERD returning to the clinic for follow-up of Parkinson's disease and headaches.  The patient was accompanied to the clinic by her caregiver, [name].  History of present illness: Leg weakness started slowly around May 2014 when she noticed difficulty with walking and tripping on things. She would stumble frequently, no falls. EMG was performed and shows moderate peripheral neuropathy of the legs and L3-L4 old radiculopathy.   Caregiver says that she is more forgetful than usual, especially with conversation.    In April 2016, she was hospitalization for acute stroke manifested with slurred speech which has since resolved.  She was started on Plavix 75 mg.  During the fall of 2016, she started experiencing bifrontal throbbing headaches.  In June 2017 she was diagnosed with parkinsonism she noticed that her movements and become slower and she is able to do less work. She notices a marked difference in her walking when she is taking sinemet 1 tab TID.  In late January 2018, she developed acute onset of dysarthria and right facial droop and found to have small left frontal corona radiata stroke secondary to small vessel disease.  She was taking plavix 75mg  and with her new event, aspirin was added for 3 months.  She is mostly bothered by bifrontal throbbing headaches. She was given a prescription for topiramate in the hospital, but did not take this.  For her headaches, nortriptyline 50mg  at bedtime was started which helped.    Over the past year, she has become sedentary and deconditioned. I attempted to increase sinemet to 1.5 tab TID,  however she developed increased weakness and reduced it back down to 1mg  TID.  UPDATE 03/15/2017:  She is here for 4 month follow-up visit.  She continues to feel "unwell" and has no energy.  She is mostly sedentary and here today in a wheelchair. She uses a walker at home, but tires easily so uses a wheelchair for long distances.  She continues to have 24-hr caregiver support.  Headaches are much better and neuropathy is stable.  She uses diclofenac on her feet and feels that it immediately provides relief.  She has not suffered any interval falls.  At night, she occasionally has a right foot tremor, only when at rest and trying to go to sleep.  Medications:  Current Outpatient Medications on File Prior to Visit  Medication Sig Dispense Refill  . acetaminophen (TYLENOL) 500 MG tablet Take 1 tablet (500 mg total) by mouth every 8 (eight) hours as needed (for pain). 180 tablet 2  . ALPRAZolam (XANAX) 0.5 MG tablet TAKE 1 TABLET TWICE DAILY. 60 tablet 3  . Biotin 5000 MCG CAPS Take 1 capsule (5,000 mcg total) by mouth daily. 30 capsule 0  . carbidopa-levodopa (SINEMET IR) 25-100 MG tablet Take 1 tablet by mouth 3 (three) times daily. 270 tablet 3  . Cholecalciferol (VITAMIN D PO) Take 1 tablet by mouth daily.     . clopidogrel (PLAVIX) 75 MG tablet TAKE 1 TABLET ONCE DAILY. 90 tablet 2  . diclofenac sodium (VOLTAREN) 1 % GEL Apply  To feet 3 times daily prn for pain (Patient taking differently: Apply  To feet 3 times daily prn  for pain) 1 Tube 3  . furosemide (LASIX) 20 MG tablet Take 1 tablet (20 mg total) by mouth daily as needed. 30 tablet 0  . ipratropium (ATROVENT) 0.06 % nasal spray Place 2 sprays into both nostrils 4 (four) times daily. 15 mL 12  . levothyroxine (SYNTHROID, LEVOTHROID) 50 MCG tablet Take 1 tablet (50 mcg total) by mouth daily before breakfast. 30 tablet 3  . nitroGLYCERIN (NITROSTAT) 0.4 MG SL tablet Place 1 tablet (0.4 mg total) under the tongue every 5 (five) minutes as  needed for chest pain. 5 tablet 0  . nortriptyline (PAMELOR) 50 MG capsule Take 1 capsule (50 mg total) by mouth at bedtime. 30 capsule 11  . pantoprazole (PROTONIX) 40 MG tablet Take 1 tablet (40 mg total) by mouth daily. 30 tablet 11  . perphenazine (TRILAFON) 2 MG tablet TAKE ONE TABLET AT BEDTIME. 30 tablet 3  . polyethylene glycol (MIRALAX / GLYCOLAX) packet Take 17 g by mouth daily. 14 each 0  . pravastatin (PRAVACHOL) 20 MG tablet TAKE (1/2) TABLET DAILY. 45 tablet 3  . solifenacin (VESICARE) 10 MG tablet TAKE (1) TABLET DAILY AS NEEDED FOR BLADDER 30 tablet 0  . tiZANidine (ZANAFLEX) 2 MG tablet Take 1 tablet (2 mg total) by mouth at bedtime. 30 tablet 3  . traMADol (ULTRAM) 50 MG tablet TAKE (1) TABLET EVERY SIX HOURS AS NEEDED FOR PAIN. 10 tablet 0  . triazolam (HALCION) 0.25 MG tablet Take 0.5 tablets (0.125 mg total) by mouth at bedtime as needed for sleep. 45 tablet 0  . vitamin B-12 (CYANOCOBALAMIN) 1000 MCG tablet Take 1 tablet (1,000 mcg total) by mouth daily. 30 tablet 0   No current facility-administered medications on file prior to visit.     Allergies:  Allergies  Allergen Reactions  . Lipitor [Atorvastatin Calcium] Other (See Comments)    Leg weakness,memory loss  . Lovastatin Other (See Comments)    Leg weakness  . Celebrex [Celecoxib] Itching and Rash  . Latex Rash  . Macrobid [employer] Macro] Other (See Comments)    Unknown per patient  . Other Other (See Comments)    Always pads     Review of Systems:  CONSTITUTIONAL: No fevers, chills, night sweats, or weight loss.   EYES: No visual changes or eye pain ENT: No hearing changes.  No history of nose bleeds. RESPIRATORY: No cough, wheezing, shortness of breath.   CARDIOVASCULAR: Negative for chest pain, and palpitations.   GI: Negative for abdominal discomfort, blood in stools or black stools.  No recent change in bowel habits.  GU:  No history of incontinence.   MUSCLOSKELETAL: +history of  joint pain or swelling.   + back pain SKIN: Negative for lesions, rash, and itching.   HEMATOLOGY/ONCOLOGY: Negative for prolonged bleeding, bruising easily, and swollen nodes.   ENDOCRINE: Negative for cold or heat intolerance, polydipsia or goiter.   PSYCH:  +depression or anxiety symptoms.   NEURO: As Above.   Vital Signs:  BP 100/60   Pulse 86   Ht 5' (1.524 m)   Wt 157 lb (71.2 kg)   SpO2 95%   BMI 30.66 kg/m   General:  She has severely blunted affect, appears comfortable.  Neurological Exam: MENTAL STATUS including orientation to person, place, and time is intact.  Speech is somewhat slurred because she keeps candy in her mouth due to dry mouth  CRANIAL NERVES:  Pupils round and reactive to light. There is mild flattening of the right nasolabial fold.  Facial movements are intact.   MOTOR:  Motor strength is 5/5, tone is normal.  There is no tremor.  COORDINATION/GAIT:  Finger tapping is normal, mildly reduced amplitude.  Gait not tested today, but she is able to stand with one-person assist.  Data: EMG 01/15/2013:  Taken together, these findings are consistent with a moderate generalized, large fiber, sensorimotor polyneuropathy affecting the left side. There is a superimposed intraspinal canal lesion (i.e. radiculopathy) affecting the L2-L4 nerve root/segments bilaterally, moderate in degree electrically.  MRI/A Brain 07/03/2014: 1. Acute nonhemorrhagic 21 mm white matter infarct of the posterior right frontal lobe. 2. Extensive atrophy and diffuse white matter disease corresponds to advanced chronic microvascular ischemia. 3. Moderate to high-grade stenosis of the infra posterior right M2 branch. 4. Moderate small vessel disease is evident on the MRA. 5. High-grade stenosis in the proximal left P2 segment.  MRI/A brain wo contrast 04/27/2016: 1. Subcentimeter acute/early subacute infarct within the left frontal centrum semiovale. No acute hemorrhage. 2. Stable advanced  chronic microvascular ischemic changes and moderate volume loss of the brain. 3. No evidence of large vessel occlusion, or aneurysm of the circle of Willis. Intracranial atherosclerosis with short segments of stenosis most pronounced in the left posterior cerebral artery.  US carotids 04/29/2016:  1-39% ICA stenosis bilaterally TTE 04/29/2016:  EF 55-60%, no thrombus  IMPRESSION/PLAN:   1.  Generalized deconditioning to due multiple comorbidities (vascular parkinsonism, peripheral neuropathy, osteoarthritis and advanced age).  Unfortunately, there is no easy fix to her malaise. Continue supportive care and encouraged to try to stay active. TSH is normal.  Recommend adding daily multivitamin  2.  Vascular parkinsonism, REM behavior disorder, Lewy body dementia - stable  - Continue sinemet 25/100 1 tab TID  3.  Subacute R corona radiata due to small vessel disease (Jan 2018) with residual mild right flattening of the nasolabial fold and dysarthria.  - Continue plavix 75mg  daily   - Secondary risk factors are well-controlled, no diabetes or hypertension, LDL 57 on pravastatin 20mg .   4.  Chronic tension headaches - improved  - Continue nortriptyline to 50mg .   Last QTc is 446, do not titrate any higher  - Dry mouth is most likely side effect from nortriptyline, but she does not want to reduce the dose  - Continue tizanidine to 2mg  as needed  5.  Peripheral neuropathy and gait instability - stable  - Continue nortriptyline to 50mg  qhs   - She is very compliant with using a walker/wheelchair  Return to clinic in 4 months   Greater than 50% of this 30 minute visit was spent in counseling, explanation of diagnosis, planning of further management, and coordination of care due to her many questions.   Thank you for allowing me to participate in patient's care.  If I can answer any additional questions, I would be pleased to do so.    Sincerely,    Quintana Canelo K. Posey Pronto, DO

## 2017-03-15 NOTE — Patient Instructions (Signed)
Try lidocaine ointment to your feet (Aspercream, Salonpas, etc).  Return to clinic in 4 months

## 2017-03-18 ENCOUNTER — Telehealth: Payer: Self-pay | Admitting: Neurology

## 2017-03-18 ENCOUNTER — Ambulatory Visit: Payer: Medicare Other | Admitting: Cardiology

## 2017-03-18 NOTE — Telephone Encounter (Signed)
Pt called and said the new medication is not working and wanted to know if she can go back on the old medication, please call and advisei

## 2017-03-21 NOTE — Telephone Encounter (Signed)
Called patient back and informed her that it is ok to go back to the voltaren per Dr. Posey Pronto.

## 2017-03-25 ENCOUNTER — Telehealth: Payer: Self-pay | Admitting: *Deleted

## 2017-03-25 NOTE — Telephone Encounter (Signed)
Medical Record request was faxed to [facility].

## 2017-04-01 NOTE — Telephone Encounter (Signed)
Records Received from [facility] and have been sent interoffice to [employer].

## 2017-04-05 ENCOUNTER — Other Ambulatory Visit: Payer: Self-pay

## 2017-04-05 ENCOUNTER — Ambulatory Visit (INDEPENDENT_AMBULATORY_CARE_PROVIDER_SITE_OTHER): Payer: Medicare Other | Admitting: Internal Medicine

## 2017-04-05 ENCOUNTER — Encounter: Payer: Self-pay | Admitting: Internal Medicine

## 2017-04-05 ENCOUNTER — Other Ambulatory Visit (INDEPENDENT_AMBULATORY_CARE_PROVIDER_SITE_OTHER): Payer: Medicare Other

## 2017-04-05 DIAGNOSIS — R0982 Postnasal drip: Secondary | ICD-10-CM

## 2017-04-05 DIAGNOSIS — G4489 Other headache syndrome: Secondary | ICD-10-CM | POA: Diagnosis not present

## 2017-04-05 DIAGNOSIS — R300 Dysuria: Secondary | ICD-10-CM

## 2017-04-05 LAB — URINALYSIS, ROUTINE W REFLEX MICROSCOPIC
Bilirubin Urine: NEGATIVE
HGB URINE DIPSTICK: NEGATIVE
Nitrite: POSITIVE — AB
PH: 6.5 (ref 5.0–8.0)
RBC / HPF: NONE SEEN (ref 0–?)
SPECIFIC GRAVITY, URINE: 1.01 (ref 1.000–1.030)
TOTAL PROTEIN, URINE-UPE24: NEGATIVE
Urine Glucose: NEGATIVE
Urobilinogen, UA: 0.2 (ref 0.0–1.0)

## 2017-04-05 MED ORDER — PRAVASTATIN SODIUM 20 MG PO TABS: ORAL_TABLET | ORAL | 3 refills | Status: DC

## 2017-04-05 MED ORDER — PRAVASTATIN SODIUM 20 MG PO TABS: ORAL_TABLET | ORAL | 3 refills | Status: AC

## 2017-04-05 NOTE — Patient Instructions (Signed)
We will call you back about the urine results.   It is okay to use the tramadol for headaches if needed.

## 2017-04-06 ENCOUNTER — Other Ambulatory Visit: Payer: Self-pay | Admitting: Internal Medicine

## 2017-04-06 ENCOUNTER — Other Ambulatory Visit: Payer: Self-pay | Admitting: Neurology

## 2017-04-06 MED ORDER — SULFAMETHOXAZOLE-TRIMETHOPRIM 800-160 MG PO TABS: 1.0000 | ORAL_TABLET | Freq: Two times a day (BID) | ORAL | 0 refills | Status: DC

## 2017-04-08 DIAGNOSIS — R0982 Postnasal drip: Secondary | ICD-10-CM | POA: Insufficient documentation

## 2017-04-08 NOTE — Assessment & Plan Note (Signed)
U/A with signs of infection, symptoms consistent. Rx for bactrim.

## 2017-04-08 NOTE — Assessment & Plan Note (Signed)
She has not been using her ipratropium nose spray recently and this is helpful to her in the past. Reminded her about this and refill if needed.

## 2017-04-08 NOTE — Progress Notes (Signed)
   Subjective:    Patient ID: Sarah [name], female    DOB: 05-05-1926, 84 y.o.   MRN: [id number]  HPI The patient is an 84 YO female coming in for several concerns including her sinuses (she still has nose dripping, she is not using the nose spray recently, has been outside some and this set it off, denies fevers or chills, denies ear pain or sore throat), possible UTI (burnig with going, going more often, some more incontinence, denies abdominal pain or fevers), and headaches (chronic and have been slightly worse lately, she has taken some tylenol, not in the last day or so, she is not sleeping well with getting up to urinate).   Review of Systems  Constitutional: Negative for activity change, appetite change, chills, fatigue, fever and unexpected weight change.  HENT: Positive for congestion, postnasal drip and rhinorrhea. Negative for ear discharge, ear pain, sinus pressure, sinus pain, sneezing, sore throat, tinnitus, trouble swallowing and voice change.   Eyes: Negative.   Respiratory: Negative for cough, chest tightness, shortness of breath and wheezing.   Cardiovascular: Negative.   Gastrointestinal: Negative.   Genitourinary: Positive for dysuria, frequency and urgency.  Musculoskeletal: Positive for arthralgias, gait problem and myalgias.  Neurological: Positive for headaches. Negative for tremors, seizures, facial asymmetry, speech difficulty and weakness.      Objective:   Physical Exam  Constitutional: She is oriented to person, place, and time. She appears well-developed and well-nourished.  HENT:  Head: Normocephalic and atraumatic.  Oropharynx red with clear drainage, no sinus pressure, TMs normal.   Eyes: EOM are normal.  Neck: Normal range of motion.  Cardiovascular: Normal rate and regular rhythm.  Pulmonary/Chest: Effort normal and breath sounds normal. No respiratory distress. She has no wheezes. She has no rales.  Abdominal: Soft. Bowel sounds are normal. She  exhibits no distension. There is no tenderness. There is no rebound.  Musculoskeletal: She exhibits no edema.  Lymphadenopathy:    She has no cervical adenopathy.  Neurological: She is alert and oriented to person, place, and time. Coordination abnormal.  Wheeled walker for ambulation  Skin: Skin is warm and dry.   Vitals:   04/05/17 1428  BP: 104/66  Pulse: 84  Temp: 98.2 F (36.8 C)  TempSrc: Oral  SpO2: 95%  Weight: 160 lb 8 oz (72.8 kg)  Height: 5' (1.524 m)      Assessment & Plan:

## 2017-04-08 NOTE — Assessment & Plan Note (Signed)
Possibly related to increase in post nasal drip. This has been the cause in the past. She is encouraged that tylenol is the safest thing to take for headaches although she should not take more than 2-3 days per week if possible due to possibility of analgesic rebound headaches.

## 2017-04-11 ENCOUNTER — Telehealth: Payer: Self-pay | Admitting: Internal Medicine

## 2017-04-11 DIAGNOSIS — R300 Dysuria: Secondary | ICD-10-CM

## 2017-04-11 NOTE — Telephone Encounter (Signed)
Copied from [place] [id number]. Topic: General - Other >> Apr 11, 2017  4:23 PM Cecelia Byars, NT wrote: Reason for CRM: Patient called and said the antibiotic  sulfamethoxazole  she was given UTI did not work completely work the symptoms have still returned please advise [phone]

## 2017-04-12 NOTE — Telephone Encounter (Signed)
Should come to lab and leave sample to see if infection is gone.

## 2017-04-12 NOTE — Telephone Encounter (Signed)
Barbaraann Cao (care giver) MD response. Stated understanding said they may not be able to get to the lab till Thursday but will try to get in asap

## 2017-04-14 ENCOUNTER — Other Ambulatory Visit (INDEPENDENT_AMBULATORY_CARE_PROVIDER_SITE_OTHER): Payer: Medicare Other

## 2017-04-14 DIAGNOSIS — R300 Dysuria: Secondary | ICD-10-CM

## 2017-04-14 LAB — URINALYSIS, ROUTINE W REFLEX MICROSCOPIC
Bilirubin Urine: NEGATIVE
HGB URINE DIPSTICK: NEGATIVE
Ketones, ur: NEGATIVE
NITRITE: NEGATIVE
RBC / HPF: NONE SEEN (ref 0–?)
Specific Gravity, Urine: 1.01 (ref 1.000–1.030)
Total Protein, Urine: NEGATIVE
Urine Glucose: NEGATIVE
Urobilinogen, UA: 0.2 (ref 0.0–1.0)
pH: 7 (ref 5.0–8.0)

## 2017-04-15 ENCOUNTER — Telehealth: Payer: Self-pay | Admitting: Internal Medicine

## 2017-04-15 LAB — URINE CULTURE
MICRO NUMBER:: 90072340
SPECIMEN QUALITY: ADEQUATE

## 2017-04-15 NOTE — Telephone Encounter (Signed)
Copied from [place] [id number]. Topic: Inquiry >> Apr 15, 2017  1:35 PM Malena Catholic I, [place] wrote: Pt call she want know her lab Result.Thanks

## 2017-04-18 ENCOUNTER — Telehealth: Payer: Self-pay | Admitting: Internal Medicine

## 2017-04-18 NOTE — Telephone Encounter (Signed)
Routing to dr Sharlet Salina, Juluis Rainier.Marland KitchenMarland Kitchen

## 2017-04-18 NOTE — Telephone Encounter (Signed)
Patient and care giver are calling about the urine culture results- they are in [place]. Discussed results and they are aware they are negative.  Patient is still having a slight burning sensation when she gets up after urination. Discussed fluid intake, probiotics, cranberry juice, and scheduling emptying bladder on regular schedule. She will call back with increase in symptoms.

## 2017-04-19 NOTE — Telephone Encounter (Signed)
Patient received results on mychart

## 2017-04-27 NOTE — Progress Notes (Signed)
[name] [address], [place] [address] Phone: [phone] Subjective:     CC: Left knee pain, right sacroiliac pain  XIP:[id number]  Sarah [name] is a 84 y.o. female coming in with complaint of bilateral knee pain with right greater than left. She is also her for a right SI injection as her pain has increased since her last injection.  Patient states that the pain seems to be worsening overall.      Past Medical History:  Diagnosis Date  . Bladder incontinence   . Chest pain    S/P nuclear 07/17/2008  normal; EF 88% with no ischemia  . Chronic venous insufficiency    BLE edema, chronic, neg venous US 2/13  . Constipation    hx of  . Depression   . Diverticulitis   . Esophageal stricture    s/p dilation 04/2012  . GERD (gastroesophageal reflux disease)   . History of chicken pox   . Hyperlipidemia   . Hypertension   . Osteoarthritis   . Osteoporosis, postmenopausal 06/02/2011  . Polyp of colon   . Stroke [facility]) 07/03/2014   Past Surgical History:  Procedure Laterality Date  . APPENDECTOMY     age 43  . CATARACT EXTRACTION, BILATERAL Bilateral 11/28/2015  . COLONOSCOPY    . POLYPECTOMY    . TUBAL LIGATION    . UPPER GASTROINTESTINAL ENDOSCOPY     Social History   Socioeconomic History  . Marital status: Widowed    Spouse name: None  . Number of children: 2  . Years of education: None  . Highest education level: None  Social Needs  . Financial resource strain: None  . Food insecurity - worry: None  . Food insecurity - inability: None  . Transportation needs - medical: None  . Transportation needs - non-medical: None  Occupational History  . None  Tobacco Use  . Smoking status: Never Smoker  . Smokeless tobacco: Never Used  Substance and Sexual Activity  . Alcohol use: No    Alcohol/week: 0.0 oz  . Drug use: No  . Sexual activity: No  Other Topics Concern  . None  Social History Narrative   She lives alone  in [place] home, but stays on the first floor.  Has 2 children.   Allergies  Allergen Reactions  . Lipitor [Atorvastatin Calcium] Other (See Comments)    Leg weakness,memory loss  . Lovastatin Other (See Comments)    Leg weakness  . Celebrex [Celecoxib] Itching and Rash  . Latex Rash  . Macrobid [employer] Macro] Other (See Comments)    Unknown per patient  . Other Other (See Comments)    Always pads   Family History  Problem Relation Age of Onset  . Cancer Son        testicular cancer  . Breast cancer Sister   . Skin cancer Sister   . Colon cancer Neg Hx   . Esophageal cancer Neg Hx   . Rectal cancer Neg Hx   . Stomach cancer Neg Hx      Past medical history, social, surgical and family history all reviewed in electronic medical record.  No pertanent information unless stated regarding to the chief complaint.   Review of Systems:Review of systems updated and as accurate as of 04/28/17  No headache, visual changes, nausea, vomiting, diarrhea, constipation, dizziness, abdominal pain, skin rash, fevers, chills, night sweats, weight loss, swollen lymph nodes, body aches, joint swelling, muscle aches, chest  pain, shortness of breath, mood changes.  Positive muscle aches  Objective  Blood pressure 118/82, pulse 70, SpO2 (!) 86 %. Systems examined below as of 04/28/17   General: No apparent distress alert and oriented x3 mood and affect normal, dressed appropriately.  Masked facies noted worsening from previous exam resting tremor noted of the right upper extremity HEENT: Pupils equal, extraocular movements intact  Respiratory: Patient's speak in full sentences and does not appear short of breath  Cardiovascular: 1+ lower extremity edema, non tender, no erythema  Skin: Warm dry intact with no signs of infection or rash on extremities or on axial skeleton.  Abdomen: Soft tender no masses Neuro: Cranial nerves II through XII are intact, patient does have rigidity of  multiple joints and does have cogwheeling.  Lymph: No lymphadenopathy of posterior or anterior cervical chain or axillae bilaterally.  Gait severely antalgic MSK: Knee: Right valgus deformity noted.  Abnormal thigh to calf ratio.  Tender to palpation over medial and PF joint line.  Lacking 10 degrees of flexion and extension instability with valgus force.  painful patellar compression. Patellar glide with moderate crepitus. Patellar and quadriceps tendons unremarkable. Hamstring and quadriceps strength is normal. Contralateral knee shows valgus deformity but nontender  After informed written and verbal consent, patient was seated on exam table. Right knee was prepped with alcohol swab and utilizing anterolateral approach, patient's right knee space was injected with 4:1  marcaine 0.5%: Kenalog 40mg /dL. Patient tolerated the procedure well without immediate complications.  After verbal consent patient was prepped with alcohol swab and with a 22-gauge 3 inch needle was injected with a total of 0.5 cc of 0.5% Marcaine and 0.5 cc of Kenalog 40 mg/mL into the right SI joint.  Band-Aid placed.  Minimal blood loss.  Postinjection instructions given    Impression and Recommendations:     This case required medical decision making of moderate complexity.      Note: This dictation was prepared with Dragon dictation along with smaller phrase technology. Any transcriptional errors that result from this process are unintentional.

## 2017-04-28 ENCOUNTER — Ambulatory Visit (INDEPENDENT_AMBULATORY_CARE_PROVIDER_SITE_OTHER): Payer: Medicare Other | Admitting: Family Medicine

## 2017-04-28 ENCOUNTER — Encounter: Payer: Self-pay | Admitting: Family Medicine

## 2017-04-28 DIAGNOSIS — M170 Bilateral primary osteoarthritis of knee: Secondary | ICD-10-CM | POA: Diagnosis not present

## 2017-04-28 DIAGNOSIS — G20 Parkinson's disease: Secondary | ICD-10-CM | POA: Diagnosis not present

## 2017-04-28 DIAGNOSIS — M47818 Spondylosis without myelopathy or radiculopathy, sacral and sacrococcygeal region: Secondary | ICD-10-CM

## 2017-04-28 DIAGNOSIS — M4698 Unspecified inflammatory spondylopathy, sacral and sacrococcygeal region: Secondary | ICD-10-CM

## 2017-04-28 DIAGNOSIS — M1711 Unilateral primary osteoarthritis, right knee: Secondary | ICD-10-CM | POA: Diagnosis not present

## 2017-04-28 NOTE — Assessment & Plan Note (Signed)
Severe.  Repeat injection given again today.  Tolerated the procedure well.  Discussed icing regimen.  Patient does not seem to be doing well but does have caregiver with her.  Can call if anything else is necessary.

## 2017-04-28 NOTE — Assessment & Plan Note (Signed)
Significant worsening from previous exam

## 2017-04-28 NOTE — Patient Instructions (Signed)
Good to see you  [name] is your friend.  Injected the knee and the SI joint.   See me again in 10 weeks

## 2017-04-28 NOTE — Assessment & Plan Note (Signed)
Right knee injected today.  Tolerated the procedure well.  Discussed icing regimen, home exercises, patient is continuing to have significant difficulty.  Was would be unable to wear custom brace because patient would not be able to put it on regularly.  Can repeat injections every 10 weeks

## 2017-05-04 ENCOUNTER — Other Ambulatory Visit: Payer: Self-pay | Admitting: Internal Medicine

## 2017-05-18 NOTE — Progress Notes (Signed)
[name] [place],  [address] Phone: [phone] Subjective:     CC: Left knee pain  OJJ:[id number]  Sarah [name] is a 84 y.o. female coming in with complaint of left knee pain.  Found to have arthritic changes of the left knee.  Has responded fairly well to injections previously.  Was given injection the right knee 2 weeks ago.  Now having worsening pain left knee.  Making it difficult to ambulate.  Patient does have Parkinson's and is walking with the aid of a walker but finding it more and more difficult recently.     Past Medical History:  Diagnosis Date  . Bladder incontinence   . Chest pain    S/P nuclear 07/17/2008  normal; EF 88% with no ischemia  . Chronic venous insufficiency    BLE edema, chronic, neg venous US 2/13  . Constipation    hx of  . Depression   . Diverticulitis   . Esophageal stricture    s/p dilation 04/2012  . GERD (gastroesophageal reflux disease)   . History of chicken pox   . Hyperlipidemia   . Hypertension   . Osteoarthritis   . Osteoporosis, postmenopausal 06/02/2011  . Polyp of colon   . Stroke [facility]) 07/03/2014   Past Surgical History:  Procedure Laterality Date  . APPENDECTOMY     age 66  . CATARACT EXTRACTION, BILATERAL Bilateral 11/28/2015  . COLONOSCOPY    . POLYPECTOMY    . TUBAL LIGATION    . UPPER GASTROINTESTINAL ENDOSCOPY     Social History   Socioeconomic History  . Marital status: Widowed    Spouse name: None  . Number of children: 2  . Years of education: None  . Highest education level: None  Social Needs  . Financial resource strain: None  . Food insecurity - worry: None  . Food insecurity - inability: None  . Transportation needs - medical: None  . Transportation needs - non-medical: None  Occupational History  . None  Tobacco Use  . Smoking status: Never Smoker  . Smokeless tobacco: Never Used  Substance and Sexual Activity  . Alcohol use: No   Alcohol/week: 0.0 oz  . Drug use: No  . Sexual activity: No  Other Topics Concern  . None  Social History Narrative   She lives alone in [place] home, but stays on the first floor.  Has 2 children.   Allergies  Allergen Reactions  . Lipitor [Atorvastatin Calcium] Other (See Comments)    Leg weakness,memory loss  . Lovastatin Other (See Comments)    Leg weakness  . Celebrex [Celecoxib] Itching and Rash  . Latex Rash  . Macrobid [employer] Macro] Other (See Comments)    Unknown per patient  . Other Other (See Comments)    Always pads   Family History  Problem Relation Age of Onset  . Cancer Son        testicular cancer  . Breast cancer Sister   . Skin cancer Sister   . Colon cancer Neg Hx   . Esophageal cancer Neg Hx   . Rectal cancer Neg Hx   . Stomach cancer Neg Hx      Past medical history, social, surgical and family history all reviewed in electronic medical record.  No pertanent information unless stated regarding to the chief complaint.   Review of Systems:Review of systems updated and as accurate as of 05/19/17  No headache, visual changes, nausea, vomiting,  diarrhea, constipation, dizziness, abdominal pain, skin rash, fevers, chills, night sweats, weight loss, swollen lymph nodes, body aches, joint swelling chest pain, shortness of breath, mood changes.  Positive muscle aches  Objective  Blood pressure 134/88, pulse 84, weight 161 lb (73 kg). Systems examined below as of 05/19/17   General: No apparent distress alert  and affect normal, dressed appropriately. Masked facies  HEENT: Pupils equal, extraocular movements intact  Respiratory: Patient's speak in full sentences and does not appear short of breath  Cardiovascular: 2+lower extremity edema, non tender, no erythema  Skin: Warm dry intact with no signs of infection or rash on extremities or on axial skeleton.  Abdomen: Soft nontender  Neuro: Cranial nerves II through XII are intact,  neurovascularly intact in all extremities with 2+ DTRs and 2+ pulses.  Lymph: No lymphadenopathy of posterior or anterior cervical chain or axillae bilaterally.  Gait normal with good balance and coordination.  Patient actually in a wheelchair today : Arthritic changes of multiple joints.  Patient does have cogwheeling of multiple joint Knee: Left valgus deformity noted.   Tender to palpation over medial and PF joint line.  Limitation of range of motion by 10 degrees in all planes painful patellar compression. Patellar glide with moderate crepitus. Patellar and quadriceps tendons unremarkable. Hamstring and quadriceps strength is normal. Contralateral knee shows arthritic changes as well but not as tender Back exam shows the patient does have normal sacroiliac joint pain still.   After informed written and verbal consent, patient was seated on exam table. Left knee was prepped with alcohol swab and utilizing anterolateral approach, patient's left knee space was injected with 4:1  marcaine 0.5%: Kenalog 40mg /dL. Patient tolerated the procedure well without immediate complications.   Impression and Recommendations:     This case required medical decision making of moderate complexity.      Note: This dictation was prepared with Dragon dictation along with smaller phrase technology. Any transcriptional errors that result from this process are unintentional.

## 2017-05-19 ENCOUNTER — Other Ambulatory Visit: Payer: Self-pay | Admitting: Internal Medicine

## 2017-05-19 ENCOUNTER — Other Ambulatory Visit (INDEPENDENT_AMBULATORY_CARE_PROVIDER_SITE_OTHER): Payer: Medicare Other

## 2017-05-19 ENCOUNTER — Encounter: Payer: Self-pay | Admitting: Family Medicine

## 2017-05-19 ENCOUNTER — Ambulatory Visit (INDEPENDENT_AMBULATORY_CARE_PROVIDER_SITE_OTHER): Payer: Medicare Other | Admitting: Family Medicine

## 2017-05-19 ENCOUNTER — Telehealth: Payer: Self-pay | Admitting: Internal Medicine

## 2017-05-19 DIAGNOSIS — M170 Bilateral primary osteoarthritis of knee: Secondary | ICD-10-CM

## 2017-05-19 DIAGNOSIS — R300 Dysuria: Secondary | ICD-10-CM | POA: Diagnosis not present

## 2017-05-19 LAB — URINALYSIS, ROUTINE W REFLEX MICROSCOPIC
Bilirubin Urine: NEGATIVE
Hgb urine dipstick: NEGATIVE
Nitrite: POSITIVE — AB
RBC / HPF: NONE SEEN
Specific Gravity, Urine: 1.01
Total Protein, Urine: 100 — AB
Urine Glucose: 100 — AB
Urobilinogen, UA: >=8 — AB
pH: 5.5 (ref 5.0–8.0)

## 2017-05-19 MED ORDER — SULFAMETHOXAZOLE-TRIMETHOPRIM 800-160 MG PO TABS
1.0000 | ORAL_TABLET | Freq: Two times a day (BID) | ORAL | 0 refills | Status: DC
Start: 2017-05-19 — End: 2017-06-13

## 2017-05-19 NOTE — Patient Instructions (Signed)
always brighten my day seeing you  Arnica lotion to the leg 2 times a day can help with the bruising.  Compression stockings can help  When you can get the legs above your heart to help with swelling See me again in 2 months

## 2017-05-19 NOTE — Telephone Encounter (Signed)
Labs placed and can go to lab prior or after visit.

## 2017-05-19 NOTE — Telephone Encounter (Signed)
Copied from [place] [id number]. Topic: Quick Communication - See Telephone Encounter >> May 19, 2017 11:18 AM Robina Ade, Helene Kelp D wrote: CRM for notification. See Telephone encounter for: 05/19/17. Patient will be in the office today to see Dr. Tamala Julian and would like if Dr. Sharlet Salina can request labs for urine for her, she thinks she has a UTI. Please call patient if this is possible, thanks.

## 2017-05-19 NOTE — Assessment & Plan Note (Signed)
Left knee was injected May 19, 2017.  Discussed icing regimen and home exercises.  Patient symptoms will come back and see me sooner.  I do believe that patient is becoming more incapacitated secondary to her Parkinson's.  Seems to be having some failure to thrive.  We will continue to monitor closely.

## 2017-05-20 ENCOUNTER — Ambulatory Visit: Payer: Medicare Other | Admitting: Internal Medicine

## 2017-05-20 LAB — URINE CULTURE
MICRO NUMBER: 90230551
SPECIMEN QUALITY: ADEQUATE

## 2017-05-23 ENCOUNTER — Telehealth: Payer: Self-pay | Admitting: Neurology

## 2017-05-23 NOTE — Telephone Encounter (Signed)
I spoke with daughter and informed her that I will put her mom on our waiting list.  I will call her on Tuesday to let her know if we have anything open.

## 2017-05-23 NOTE — Telephone Encounter (Signed)
Patient Daughter [name] called and wants to talk to Caryl Pina about her mother about what is going on with her according to the voicemail left

## 2017-05-24 ENCOUNTER — Telehealth: Payer: Self-pay | Admitting: Neurology

## 2017-05-24 NOTE — Telephone Encounter (Signed)
Called [name] and informed her that we will call as soon as we get an appointment.

## 2017-05-24 NOTE — Telephone Encounter (Signed)
Charna Busman left a VM message saying she was supposed to get a call back from [place] regarding an appointment for Pt CB# [phone]

## 2017-05-25 ENCOUNTER — Other Ambulatory Visit: Payer: Self-pay | Admitting: Internal Medicine

## 2017-05-25 ENCOUNTER — Other Ambulatory Visit: Payer: Self-pay | Admitting: Neurology

## 2017-05-25 NOTE — Telephone Encounter (Signed)
I have already spoken to [name] about this.

## 2017-05-25 NOTE — Telephone Encounter (Signed)
Control database checked last refill: 04/25/2017 LOV: 04/05/2017

## 2017-05-30 ENCOUNTER — Other Ambulatory Visit: Payer: Self-pay

## 2017-05-30 MED ORDER — PANTOPRAZOLE SODIUM 40 MG PO TBEC: 40.0000 mg | DELAYED_RELEASE_TABLET | Freq: Every day | ORAL | 11 refills | Status: AC

## 2017-05-30 MED ORDER — PANTOPRAZOLE SODIUM 40 MG PO TBEC: 40.0000 mg | DELAYED_RELEASE_TABLET | Freq: Every day | ORAL | 11 refills | Status: DC

## 2017-06-01 ENCOUNTER — Other Ambulatory Visit: Payer: Self-pay | Admitting: Internal Medicine

## 2017-06-06 ENCOUNTER — Other Ambulatory Visit: Payer: Self-pay | Admitting: Internal Medicine

## 2017-06-06 NOTE — Telephone Encounter (Signed)
Control database checked last refill: 05/05/2017  LOV: 04/05/2017

## 2017-06-09 ENCOUNTER — Other Ambulatory Visit: Payer: Self-pay | Admitting: *Deleted

## 2017-06-09 ENCOUNTER — Telehealth: Payer: Self-pay | Admitting: Internal Medicine

## 2017-06-09 DIAGNOSIS — R300 Dysuria: Secondary | ICD-10-CM

## 2017-06-09 MED ORDER — EPICERAM EX EMUL: CUTANEOUS | 1 refills | Status: DC

## 2017-06-09 NOTE — Telephone Encounter (Signed)
Pt's caregiver [name] called requesting a rx of epicream be sent into pharmacy. [name] stated that dr Tamala Julian gave pt samples at last OV & it has really help.  rx sent into pharmacy.

## 2017-06-09 NOTE — Telephone Encounter (Signed)
Copied from [place]. Topic: Quick Communication - See Telephone Encounter >> Jun 09, 2017  4:53 PM [place], Laban Emperor wrote: CRM for notification. See Telephone encounter for:  Pt caretaker [name] states she thinks the pt UTI has come back. She would like to know if the pt can get a refill on sulfamethoxazole-trimethoprim (BACTRIM DS,SEPTRA DS) 800-160 MG tablet [[id number]] ([place], [place] [phone] (Phone) [phone] (Fax)) or another order urine sample.  Please f/u.    06/09/17.

## 2017-06-10 ENCOUNTER — Other Ambulatory Visit (INDEPENDENT_AMBULATORY_CARE_PROVIDER_SITE_OTHER): Payer: Medicare Other

## 2017-06-10 ENCOUNTER — Other Ambulatory Visit: Payer: Self-pay | Admitting: *Deleted

## 2017-06-10 DIAGNOSIS — R300 Dysuria: Secondary | ICD-10-CM

## 2017-06-10 LAB — URINALYSIS, ROUTINE W REFLEX MICROSCOPIC
Bilirubin Urine: NEGATIVE
Hgb urine dipstick: NEGATIVE
KETONES UR: NEGATIVE
Nitrite: NEGATIVE
PH: 6.5 (ref 5.0–8.0)
RBC / HPF: NONE SEEN (ref 0–?)
SPECIFIC GRAVITY, URINE: 1.015 (ref 1.000–1.030)
Total Protein, Urine: NEGATIVE
URINE GLUCOSE: NEGATIVE
UROBILINOGEN UA: 0.2 (ref 0.0–1.0)

## 2017-06-10 MED ORDER — EPICERAM EX EMUL: CUTANEOUS | 1 refills | Status: AC

## 2017-06-10 NOTE — Telephone Encounter (Signed)
daughter called - per the fc, she is going to pick up sterile container, and bring it back for the lab to test.

## 2017-06-10 NOTE — Telephone Encounter (Signed)
Noted  

## 2017-06-10 NOTE — Telephone Encounter (Signed)
We have put in so she can come leave a sample to see if she has infection.

## 2017-06-13 ENCOUNTER — Other Ambulatory Visit: Payer: Self-pay | Admitting: Internal Medicine

## 2017-06-13 ENCOUNTER — Telehealth: Payer: Self-pay

## 2017-06-13 LAB — URINE CULTURE
MICRO NUMBER:: 90331455
SPECIMEN QUALITY:: ADEQUATE

## 2017-06-13 MED ORDER — SULFAMETHOXAZOLE-TRIMETHOPRIM 800-160 MG PO TABS: 1.0000 | ORAL_TABLET | Freq: Two times a day (BID) | ORAL | 0 refills | Status: DC

## 2017-06-13 NOTE — Telephone Encounter (Signed)
Patients daughter [name] called and was wanting to know if a medication called Ellura would/could be considered or appropriate for mother to take. States she has seen it all over social media and looked into it and it is supposed to be a preventative for constant UTI's. Daughter states she can buy this online for her mother but wants to know if Dr. Sharlet Salina would be okay with her mother taking it.  Daughter is also concerned about mother because she does not want to exercise or walk, just wants to go everywhere in her wheel chair. States that if anyone tries to encourage her to walk or exercise she gets very mad. Daughter is just wanting to know when is the point where they just let it go and let their mother just do what she wants.

## 2017-06-14 NOTE — Telephone Encounter (Signed)
This is not harmful to try ellura.

## 2017-06-14 NOTE — Telephone Encounter (Signed)
LVM for Daughter giving Md response

## 2017-06-20 ENCOUNTER — Other Ambulatory Visit: Payer: Self-pay | Admitting: Neurology

## 2017-06-30 ENCOUNTER — Ambulatory Visit: Payer: Self-pay | Admitting: *Deleted

## 2017-06-30 NOTE — Telephone Encounter (Signed)
Sarah [name], caretaker called in for the pt.   Sarah [name] was not with pt when she called but was calling on the family's behalf.  Sarah [name] is having swelling in her legs up to her knees.  Equal in diameter.  No pain.   She has gained 3-5 lbs over the past week but Sarah [name] fluctuates this much with her weight but her legs are not usually swollen.    Sarah [name] also stated the caretakers on the evening shift have seen worms in her stool.  Sarah [name], caregiver for 1st shift, has not seen the worms herself.   Pt had a spell of being nauseated  around 3/20  and was given Immodium AD.  She got constipated and then was given  Miralax and Colace by the other caregivers.   She had BMs after that.  Shortly after that happened her legs began to swell.  Her Bowels are fine now except for the worms.  I made an appt with Dr. Jenny [name] for 07/01/17 at 4:00 since Dr. Sharlet [name] was not available.     Reason for Disposition . [1] Suspected food is eliminated AND [2] abnormal color persists > 48 hours    Caregivers on the evening shift have been seeing worms.  Answer Assessment - Initial Assessment Questions 1. LOCATION: "Which joint is swollen?"     Both legs swollen up to her knees.   She wore slippers yesterday.  Sarah [name] her caregiver called in. 2. ONSET: "When did the swelling start?"     A week.   I discouraged her from taking a fluid pill on the 3/20.  She was given an Immodium AD for nausea that day.    She got real constipated.   After a week she was given Mialex and Colace.  Her feet started swelling not long after that.   After 3 days she started having BMs.    3. SIZE: "How large is the swelling?"     Feet and legs up to knees are swollen. 4. PAIN: "Is there any pain?" If so, ask: "How bad is it?" (Scale 1-10; or mild, moderate, severe)     No 5. CAUSE: "What do you think caused the swollen joint?"      157-163 lbs is her weight.  Gained 3-5 lbs.  It fluctuates. 6. OTHER SYMPTOMS: "Do you have any other symptoms?" (e.g.,  fever, chest pain, difficulty breathing, calf pain)     No just tired 7. PREGNANCY: "Is there any chance you are pregnant?" "When was your last menstrual period?"     N/A  Answer Assessment - Initial Assessment Questions 1. COLOR: "What color is it?" "Is that color in part or all of the stool?"     She is having worms in her stool per Sarah [name].   Sarah [name] is not there with her but the family contacted her and so Sarah [name] is calling for an appt.   Sarah [name] has not seen any worms.   It's the evening caretaker that has seen the worms.   The family requested she be checked for worms too at the appt. 2. ONSET: "When was the unusual color first noted?"     No changes in color. 3. CAUSE: "Have you eaten any food or taken any medicine of this color?" (See listing in BACKGROUND)     No 4. OTHER SYMPTOMS: "Do you have any other symptoms?" (e.g., diarrhea, jaundice, abdominal pain, fever).     Just seeing worms in stool on the evening shift but  stools are fine.  Protocols used: [place], ANKLE SWELLING-A-AH

## 2017-06-30 NOTE — Telephone Encounter (Signed)
Noted  

## 2017-07-01 ENCOUNTER — Other Ambulatory Visit: Payer: Medicare Other

## 2017-07-01 ENCOUNTER — Ambulatory Visit (INDEPENDENT_AMBULATORY_CARE_PROVIDER_SITE_OTHER): Payer: Medicare Other | Admitting: Internal Medicine

## 2017-07-01 ENCOUNTER — Encounter: Payer: Self-pay | Admitting: Internal Medicine

## 2017-07-01 VITALS — BP 124/76 | HR 88 | Temp 98.0°F | Ht 60.0 in

## 2017-07-01 DIAGNOSIS — I10 Essential (primary) hypertension: Secondary | ICD-10-CM

## 2017-07-01 DIAGNOSIS — R195 Other fecal abnormalities: Secondary | ICD-10-CM

## 2017-07-01 DIAGNOSIS — R739 Hyperglycemia, unspecified: Secondary | ICD-10-CM | POA: Diagnosis not present

## 2017-07-01 DIAGNOSIS — R609 Edema, unspecified: Secondary | ICD-10-CM | POA: Diagnosis not present

## 2017-07-01 MED ORDER — ZOSTER VAC RECOMB ADJUVANTED 50 MCG/0.5ML IM SUSR: 0.5000 mL | Freq: Once | INTRAMUSCULAR | 1 refills | Status: AC

## 2017-07-01 NOTE — Progress Notes (Signed)
Subjective:    Patient ID: Sarah [name], female    DOB: [birth year], 84 y.o.   MRN: [id number]  HPI  Here with daughter for support, c/o persistent bilat ankle edema which has not changed, goes down at night, returns with sitting up in chair during day.  Pt denies chest pain, increased sob or doe, wheezing, orthopnea, PND, increased LE swelling, palpitations, dizziness or syncope. Pt denies new neurological symptoms such as new headache, or facial or extremity weakness or numbness   Pt denies polydipsia, polyuria.  Also a caretaker noticed "something white" in the stool a few days ago, but no diarrhea and Denies worsening reflux, abd pain, dysphagia, n/v, bowel change or blood.   Past Medical History:  Diagnosis Date  . Bladder incontinence   . Chest pain    S/P nuclear 07/17/2008  normal; EF 88% with no ischemia  . Chronic venous insufficiency    BLE edema, chronic, neg venous US 2/13  . Constipation    hx of  . Depression   . Diverticulitis   . Esophageal stricture    s/p dilation 04/2012  . GERD (gastroesophageal reflux disease)   . History of chicken pox   . Hyperlipidemia   . Hypertension   . Osteoarthritis   . Osteoporosis, postmenopausal 06/02/2011  . Polyp of colon   . Stroke [facility]) 07/03/2014   Past Surgical History:  Procedure Laterality Date  . APPENDECTOMY     age 3  . CATARACT EXTRACTION, BILATERAL Bilateral 11/28/2015  . COLONOSCOPY    . POLYPECTOMY    . TUBAL LIGATION    . UPPER GASTROINTESTINAL ENDOSCOPY      reports that she has never smoked. She has never used smokeless tobacco. She reports that she does not drink alcohol or use drugs. family history includes Breast cancer in her sister; Cancer in her son; Skin cancer in her sister. Allergies  Allergen Reactions  . Lipitor [Atorvastatin Calcium] Other (See Comments)    Leg weakness,memory loss  . Lovastatin Other (See Comments)    Leg weakness  . Celebrex [Celecoxib] Itching and Rash  . Latex Rash  .  Macrobid [employer] Macro] Other (See Comments)    Unknown per patient  . Other Other (See Comments)    Always pads   Current Outpatient Medications on File Prior to Visit  Medication Sig Dispense Refill  . acetaminophen (TYLENOL) 500 MG tablet Take 1 tablet (500 mg total) by mouth every 8 (eight) hours as needed (for pain). 180 tablet 2  . ALPRAZolam (XANAX) 0.5 MG tablet TAKE 1 TABLET BY MOUTH TWICE DAILY. 60 tablet 3  . Biotin 5000 MCG CAPS Take 1 capsule (5,000 mcg total) by mouth daily. 30 capsule 0  . carbidopa-levodopa (SINEMET IR) 25-100 MG tablet Take 1 tablet by mouth 3 (three) times daily. 270 tablet 3  . Cholecalciferol (VITAMIN D PO) Take 1 tablet by mouth daily.     . clopidogrel (PLAVIX) 75 MG tablet TAKE 1 TABLET ONCE DAILY. 90 tablet 2  . Dermatological Products, Misc. [facility]) lotion Apply topically twice daily. 225 g 1  . diclofenac sodium (VOLTAREN) 1 % GEL Apply  To feet 3 times daily prn for pain (Patient taking differently: Apply  To feet 3 times daily prn for pain) 1 Tube 3  . furosemide (LASIX) 20 MG tablet Take 1 tablet (20 mg total) by mouth daily as needed. 30 tablet 0  . HALCION 0.25 MG tablet TAKE 1/2 TABLET AT BEDTIME AS NEEDED  FOR SLEEP. 15 tablet 3  . ipratropium (ATROVENT) 0.06 % nasal spray Place 2 sprays into both nostrils 4 (four) times daily. 15 mL 12  . nitroGLYCERIN (NITROSTAT) 0.4 MG SL tablet Place 1 tablet (0.4 mg total) under the tongue every 5 (five) minutes as needed for chest pain. 5 tablet 0  . nortriptyline (PAMELOR) 25 MG capsule TAKE 2 CAPSULES AT BEDTIME. 60 capsule 5  . nortriptyline (PAMELOR) 50 MG capsule Take 1 capsule (50 mg total) by mouth at bedtime. 30 capsule 11  . ondansetron (ZOFRAN) 4 MG tablet TAKE 1 OR 2 TABLETS EVERY 8 HOURS AS NEEDED FOR NAUSEA. 6 tablet 0  . pantoprazole (PROTONIX) 40 MG tablet Take 1 tablet (40 mg total) by mouth daily. 30 tablet 11  . perphenazine (TRILAFON) 2 MG tablet TAKE ONE TABLET AT  BEDTIME. 30 tablet 0  . polyethylene glycol (MIRALAX / GLYCOLAX) packet Take 17 g by mouth daily. 14 each 0  . pravastatin (PRAVACHOL) 20 MG tablet TAKE (1/2) TABLET DAILY. 45 tablet 3  . solifenacin (VESICARE) 10 MG tablet TAKE (1) TABLET DAILY AS NEEDED FOR BLADDER 30 tablet 0  . sulfamethoxazole-trimethoprim (BACTRIM DS,SEPTRA DS) 800-160 MG tablet Take 1 tablet by mouth 2 (two) times daily. 14 tablet 0  . tiZANidine (ZANAFLEX) 2 MG tablet Take 1 tablet (2 mg total) by mouth at bedtime. 30 tablet 3  . traMADol (ULTRAM) 50 MG tablet TAKE (1) TABLET EVERY SIX HOURS AS NEEDED FOR PAIN. 10 tablet 0  . vitamin B-12 (CYANOCOBALAMIN) 1000 MCG tablet Take 1 tablet (1,000 mcg total) by mouth daily. 30 tablet 0   No current facility-administered medications on file prior to visit.    Review of Systems  Constitutional: Negative for other unusual diaphoresis or sweats HENT: Negative for ear discharge or swelling Eyes: Negative for other worsening visual disturbances Respiratory: Negative for stridor or other swelling  Gastrointestinal: Negative for worsening distension or other blood Genitourinary: Negative for retention or other urinary change Musculoskeletal: Negative for other MSK pain or swelling Skin: Negative for color change or other new lesions Neurological: Negative for worsening tremors and other numbness  Psychiatric/Behavioral: Negative for worsening agitation or other fatigue All other system neg per pt    Objective:   Physical Exam BP 124/76   Pulse 88   Temp 98 F (36.7 C) (Oral)   Ht 5' (1.524 m)   SpO2 96%   BMI 31.44 kg/m  VS noted, examined in wheelchair Constitutional: Pt appears in NAD HENT: Head: NCAT.  Right Ear: External ear normal.  Left Ear: External ear normal.  Eyes: . Pupils are equal, round, and reactive to light. Conjunctivae and EOM are normal Nose: without d/c or deformity Neck: Neck supple. Gross normal ROM Cardiovascular: Normal rate and regular  rhythm.   Pulmonary/Chest: Effort normal and breath sounds without rales or wheezing.  Abd:  Soft, NT, ND, + BS, no organomegaly Neurological: Pt is alert. At baseline orientation, motor grossly intact Skin: Skin is warm. No rashes, other new lesions, trace to 1+ bilat ankle edema Psychiatric: Pt behavior is normal without agitation  No other exam findings    Assessment & Plan:

## 2017-07-01 NOTE — Patient Instructions (Signed)
Your Shingles shot prescrption was sent to the pharmacy  Please continue all other medications as before, and refills have been done if requested.  Please have the pharmacy call with any other refills you may need.  Please continue your efforts at being more active, low cholesterol diet, and weight control.  Please keep your appointments with your specialists as you may have planned  Please go to the LAB in the Basement (turn left off the elevator) for the tests to be done today - stool test only  You will be contacted by phone if any changes need to be made immediately.  Otherwise, you will receive a letter about your results with an explanation, but please check with MyChart first.  Please remember to sign up for MyChart if you have not done so, as this will be important to you in the future with finding out test results, communicating by private email, and scheduling acute appointments online when needed.

## 2017-07-02 DIAGNOSIS — R195 Other fecal abnormalities: Secondary | ICD-10-CM | POA: Insufficient documentation

## 2017-07-02 NOTE — Assessment & Plan Note (Signed)
stable overall by history and exam, recent data reviewed with pt, and pt to continue medical treatment as before,  to f/u any worsening symptoms or concerns BP Readings from Last 3 Encounters:  07/01/17 124/76  05/19/17 134/88  04/28/17 118/82

## 2017-07-02 NOTE — Assessment & Plan Note (Signed)
Exam benign, not clear if clinically significant, for stool ova and parasite exam, tx pending results

## 2017-07-02 NOTE — Assessment & Plan Note (Signed)
stable overall by history and exam, recent data reviewed with pt, and pt to continue medical treatment as before,  to f/u any worsening symptoms or concerns Lab Results  Component Value Date   HGBA1C 5.7 (H) 04/28/2016

## 2017-07-02 NOTE — Assessment & Plan Note (Signed)
C/w venous insufficiency, to cont same tx, leg elevation, low salt diet and start compression stockings bilat

## 2017-07-04 ENCOUNTER — Other Ambulatory Visit: Payer: Self-pay | Admitting: Internal Medicine

## 2017-07-04 ENCOUNTER — Other Ambulatory Visit: Payer: Medicare Other

## 2017-07-04 DIAGNOSIS — R195 Other fecal abnormalities: Secondary | ICD-10-CM | POA: Diagnosis not present

## 2017-07-05 LAB — OVA AND PARASITE EXAMINATION
CONCENTRATE RESULT:: NONE SEEN
MICRO NUMBER:: 90429705
SPECIMEN QUALITY:: ADEQUATE
TRICHROME RESULT: NONE SEEN

## 2017-07-07 DIAGNOSIS — R404 Transient alteration of awareness: Secondary | ICD-10-CM | POA: Diagnosis not present

## 2017-07-07 DIAGNOSIS — R531 Weakness: Secondary | ICD-10-CM | POA: Diagnosis not present

## 2017-07-08 ENCOUNTER — Ambulatory Visit (INDEPENDENT_AMBULATORY_CARE_PROVIDER_SITE_OTHER): Payer: Medicare Other | Admitting: Neurology

## 2017-07-08 ENCOUNTER — Encounter: Payer: Self-pay | Admitting: Neurology

## 2017-07-08 VITALS — BP 120/80 | HR 77 | Resp 12

## 2017-07-08 DIAGNOSIS — G609 Hereditary and idiopathic neuropathy, unspecified: Secondary | ICD-10-CM | POA: Diagnosis not present

## 2017-07-08 DIAGNOSIS — G214 Vascular parkinsonism: Secondary | ICD-10-CM

## 2017-07-08 DIAGNOSIS — G464 Cerebellar stroke syndrome: Secondary | ICD-10-CM | POA: Diagnosis not present

## 2017-07-08 DIAGNOSIS — G44221 Chronic tension-type headache, intractable: Secondary | ICD-10-CM | POA: Diagnosis not present

## 2017-07-08 DIAGNOSIS — J80 Acute respiratory distress syndrome: Secondary | ICD-10-CM | POA: Diagnosis not present

## 2017-07-08 MED ORDER — TIZANIDINE HCL 2 MG PO TABS: 2.0000 mg | ORAL_TABLET | Freq: Every day | ORAL | 3 refills | Status: AC

## 2017-07-08 MED ORDER — CARBIDOPA-LEVODOPA 25-100 MG PO TABS: 1.5000 | ORAL_TABLET | Freq: Three times a day (TID) | ORAL | 3 refills | Status: AC

## 2017-07-08 MED ORDER — NORTRIPTYLINE HCL 50 MG PO CAPS: 50.0000 mg | ORAL_CAPSULE | Freq: Every day | ORAL | 3 refills | Status: AC

## 2017-07-08 NOTE — Progress Notes (Signed)
[facility]  Follow-up Visit   Date: 07/08/17   Sarah [name] MRN: [id number] DOB: 1926/07/30   Interim History: Sarah [name] is a 84 y.o. right-handed Caucasian female with history of HTN, HPL, depression, esophageal strictures, right corona radiata stroke ( residual right facial droop, 2018), vascular parkinson's disease, and GERD returning to the clinic for follow-up of Parkinson's disease and headaches.  The patient arrived on stretcher via ambulate, accompanied to the clinic by her caregiver, Sarah [name].  History of present illness: Leg weakness started slowly around May 2014 when she noticed difficulty with walking and tripping on things. She would stumble frequently, no falls. EMG was performed and shows moderate peripheral neuropathy of the legs and L3-L4 old radiculopathy.   Caregiver says that she is more forgetful than usual, especially with conversation.    In April 2016, she was hospitalization for acute stroke manifested with slurred speech which has since resolved.  She was started on Plavix 75 mg.  During the fall of 2016, she started experiencing bifrontal throbbing headaches.  In June 2017 she was diagnosed with parkinsonism she noticed that her movements and become slower and she is able to do less work. She notices a marked difference in her walking when she is taking sinemet 1 tab TID.  In late January 2018, she developed acute onset of dysarthria and right facial droop and found to have small left frontal corona radiata stroke secondary to small vessel disease.  She was taking plavix 75mg  and with her new event, aspirin was added for 3 months.  She is mostly bothered by bifrontal throbbing headaches. She was given a prescription for topiramate in the hospital, but did not take this.  For her headaches, nortriptyline 50mg  at bedtime was started which helped.    Over the past year, she has become sedentary and deconditioned. I attempted to  increase sinemet to 1.5 tab TID, however she developed increased weakness and reduced it back down to 1mg  TID.  UPDATE 03/15/2017:  She is here for 4 month follow-up visit.  She continues to feel "unwell" and has no energy.  She is mostly sedentary and here today in a wheelchair. She uses a walker at home, but tires easily so uses a wheelchair for long distances.  She continues to have 24-hr caregiver support.  Headaches are much better and neuropathy is stable.  She uses diclofenac on her feet and feels that it immediately provides relief.  She has not suffered any interval falls.  At night, she occasionally has a right foot tremor, only when at rest and trying to go to sleep.  UPDATE 07/08/2017:  She is here for 4 month follow-up visit.  Unfortunately, over the past few months Sarah [name] has had slow decline generalized well-being and independent functioning.  She is less mobile and more sedentary. She continues to use a walker at home.   She was diagnosed with UTI two weeks ago which has been treated.  Headaches and parkinson's is getting worse.  She complains of easy bruising of the arms and legs, as well as swelling.  Mood has been okay. She has 24 hr caregiver support at home.  Medications:  Current Outpatient Medications on File Prior to Visit  Medication Sig Dispense Refill  . acetaminophen (TYLENOL) 500 MG tablet Take 1 tablet (500 mg total) by mouth every 8 (eight) hours as needed (for pain). 180 tablet 2  . ALPRAZolam (XANAX) 0.5 MG tablet TAKE 1 TABLET BY MOUTH TWICE  DAILY. 60 tablet 3  . Cholecalciferol (VITAMIN D PO) Take 1 tablet by mouth daily.     . clopidogrel (PLAVIX) 75 MG tablet TAKE 1 TABLET ONCE DAILY. 90 tablet 2  . Dermatological Products, Misc. [facility]) lotion Apply topically twice daily. 225 g 1  . diclofenac sodium (VOLTAREN) 1 % GEL Apply  To feet 3 times daily prn for pain (Patient taking differently: Apply  To feet 3 times daily prn for pain) 1 Tube 3  . furosemide  (LASIX) 20 MG tablet Take 1 tablet (20 mg total) by mouth daily as needed. 30 tablet 0  . HALCION 0.25 MG tablet TAKE 1/2 TABLET AT BEDTIME AS NEEDED FOR SLEEP. 15 tablet 3  . ipratropium (ATROVENT) 0.06 % nasal spray Place 2 sprays into both nostrils 4 (four) times daily. 15 mL 12  . nitroGLYCERIN (NITROSTAT) 0.4 MG SL tablet Place 1 tablet (0.4 mg total) under the tongue every 5 (five) minutes as needed for chest pain. 5 tablet 0  . ondansetron (ZOFRAN) 4 MG tablet TAKE 1 OR 2 TABLETS EVERY 8 HOURS AS NEEDED FOR NAUSEA. 6 tablet 0  . pantoprazole (PROTONIX) 40 MG tablet Take 1 tablet (40 mg total) by mouth daily. 30 tablet 11  . perphenazine (TRILAFON) 2 MG tablet TAKE ONE TABLET AT BEDTIME. 30 tablet 0  . polyethylene glycol (MIRALAX / GLYCOLAX) packet Take 17 g by mouth daily. 14 each 0  . pravastatin (PRAVACHOL) 20 MG tablet TAKE (1/2) TABLET DAILY. 45 tablet 3  . solifenacin (VESICARE) 10 MG tablet TAKE (1) TABLET DAILY AS NEEDED FOR BLADDER 30 tablet 0  . sulfamethoxazole-trimethoprim (BACTRIM DS,SEPTRA DS) 800-160 MG tablet Take 1 tablet by mouth 2 (two) times daily. 14 tablet 0   No current facility-administered medications on file prior to visit.     Allergies:  Allergies  Allergen Reactions  . Lipitor [Atorvastatin Calcium] Other (See Comments)    Leg weakness,memory loss  . Lovastatin Other (See Comments)    Leg weakness  . Celebrex [Celecoxib] Itching and Rash  . Latex Rash  . Macrobid [employer] Macro] Other (See Comments)    Unknown per patient  . Other Other (See Comments)    Always pads     Review of Systems:  CONSTITUTIONAL: No fevers, chills, night sweats, or weight loss.   EYES: No visual changes or eye pain ENT: No hearing changes.  No history of nose bleeds. RESPIRATORY: No cough, wheezing, shortness of breath.   CARDIOVASCULAR: Negative for chest pain, and palpitations.   GI: Negative for abdominal discomfort, blood in stools or black stools.   No recent change in bowel habits.  GU:  No history of incontinence.   MUSCLOSKELETAL: +history of joint pain +swelling.   + back pain SKIN: + easy bruising, no rash, and itching.   HEMATOLOGY/ONCOLOGY: Negative for prolonged bleeding, bruising easily, and swollen nodes.   ENDOCRINE: Negative for cold or heat intolerance, polydipsia or goiter.   PSYCH:  +depression or anxiety symptoms.   NEURO: As Above.   Vital Signs:  BP 120/80   Pulse 77   Resp 12   SpO2 96%   General:  She is laying in a stretcher today, appears tired Ext:  Multiple ecchymosis over the ankles and arms.  2+ pitting edema of the legs.  Neurological Exam: MENTAL STATUS including orientation to person, place, and time is intact.  Speech is somewhat slurred  CRANIAL NERVES:  Pupils round and reactive to light. There is mild flattening  of the right nasolabial fold.  Facial movements are intact.   MOTOR:  Motor strength is 5/5, tone is mildly increased in the arms.  There is no tremor.  Reduced range of motion of hip flexion bilaterally.  COORDINATION/GAIT:  Reduced amplitude and rate of finger tapping bilaterally.  Gait is not tested   Data: EMG 01/15/2013:  Taken together, these findings are consistent with a moderate generalized, large fiber, sensorimotor polyneuropathy affecting the left side. There is a superimposed intraspinal canal lesion (i.e. radiculopathy) affecting the L2-L4 nerve root/segments bilaterally, moderate in degree electrically.  MRI/A Brain 07/03/2014: 1. Acute nonhemorrhagic 21 mm white matter infarct of the posterior right frontal lobe. 2. Extensive atrophy and diffuse white matter disease corresponds to advanced chronic microvascular ischemia. 3. Moderate to high-grade stenosis of the infra posterior right M2 branch. 4. Moderate small vessel disease is evident on the MRA. 5. High-grade stenosis in the proximal left P2 segment.  MRI/A brain wo contrast 04/27/2016: 1. Subcentimeter acute/early  subacute infarct within the left frontal centrum semiovale. No acute hemorrhage. 2. Stable advanced chronic microvascular ischemic changes and moderate volume loss of the brain. 3. No evidence of large vessel occlusion, or aneurysm of the circle of Willis. Intracranial atherosclerosis with short segments of stenosis most pronounced in the left posterior cerebral artery.  US carotids 04/29/2016:  1-39% ICA stenosis bilaterally TTE 04/29/2016:  EF 55-60%, no thrombus  IMPRESSION/PLAN:   1.  Progressive Parkinson's disease with generalized deconditioning to due multiple comorbidities (vascular parkinsonism, peripheral neuropathy, osteoarthritis and advanced age).  With her progressive functional decline, she may benefit from Palliative care services which was offered to patient.  She would like to discuss this with her children and will call us back, if she chooses to proceed.   2.  Vascular parkinsonism, REM behavior disorder, Lewy body dementia - worsening  - Increase sinemet 25/100 1.5 tab TID  3.  Subacute R corona radiata due to small vessel disease (Jan 2018) with residual mild right flattening of the nasolabial fold and dysarthria.  - Continue plavix 75mg  daily   - Secondary risk factors are well-controlled, no diabetes or hypertension, LDL 57 on pravastatin 20mg .   4.  Chronic tension headaches - worsening  - Continue nortriptyline to 50mg .   Last QTc is 446, do not titrate any higher due to dry mouth  - Start tizanidine to 2mg  at bedtime  5.  Peripheral neuropathy and gait instability - stable  - Continue nortriptyline to 50mg  qhs   - She is very compliant with using a walker/wheelchair  She had many questions about her generalize health, skin bruising, leg swelling, etc. some of which is beyond my realm of expertise, which I tried to address to the best of my knowledge.  I introduced palliative care services and have asked her to think about this, as it would be a great service for  patient especially as it is becoming increasingly difficult for her to make her out-patient medical appointments.   Return to clinic in 4 months   Greater than 50% of this 40 minute visit was spent in counseling, explanation of diagnosis, planning of further management, and coordination of care.    Thank you for allowing me to participate in patient's care.  If I can answer any additional questions, I would be pleased to do so.    Sincerely,    Nijah Orlich K. Posey Pronto, DO

## 2017-07-08 NOTE — Patient Instructions (Signed)
Increase sinemet to 1.5 tablet three times daily  Take tizanidine 2mg  at bedtime  Please let me know if you would like a referral to Palliative Care services  Return to clinic in 4 months

## 2017-07-12 ENCOUNTER — Telehealth: Payer: Self-pay | Admitting: Internal Medicine

## 2017-07-12 DIAGNOSIS — R300 Dysuria: Secondary | ICD-10-CM

## 2017-07-12 NOTE — Telephone Encounter (Signed)
Copied from [place] [id number]. Topic: Quick Communication - See Telephone Encounter >> Jul 12, 2017  3:40 PM Vernona Rieger wrote: CRM for notification. See Telephone encounter for: 07/12/17.  Sarah [name] (caregiver) called and said that her urine stinks & has burning and she thinks that it is a UTI. She said that she purchased a clean catch specimen kit. She wants to know if the order could be put in for her to drop off the specimen. Please call back [phone]

## 2017-07-13 ENCOUNTER — Other Ambulatory Visit (INDEPENDENT_AMBULATORY_CARE_PROVIDER_SITE_OTHER): Payer: Medicare Other

## 2017-07-13 DIAGNOSIS — R300 Dysuria: Secondary | ICD-10-CM | POA: Diagnosis not present

## 2017-07-13 LAB — URINALYSIS, ROUTINE W REFLEX MICROSCOPIC
BILIRUBIN URINE: NEGATIVE
Hgb urine dipstick: NEGATIVE
KETONES UR: NEGATIVE
NITRITE: POSITIVE — AB
PH: 6 (ref 5.0–8.0)
RBC / HPF: NONE SEEN (ref 0–?)
Specific Gravity, Urine: <=1.005 — AB (ref 1.000–1.030)
TOTAL PROTEIN, URINE-UPE24: NEGATIVE
URINE GLUCOSE: NEGATIVE
Urobilinogen, UA: 1 (ref 0.0–1.0)

## 2017-07-13 NOTE — Telephone Encounter (Signed)
[name] called back and I informed her the order has been placed.

## 2017-07-13 NOTE — Telephone Encounter (Signed)
Orders placed.

## 2017-07-14 ENCOUNTER — Other Ambulatory Visit: Payer: Self-pay | Admitting: Internal Medicine

## 2017-07-14 MED ORDER — SULFAMETHOXAZOLE-TRIMETHOPRIM 800-160 MG PO TABS: 1.0000 | ORAL_TABLET | Freq: Two times a day (BID) | ORAL | 0 refills | Status: AC

## 2017-07-15 LAB — URINE CULTURE
MICRO NUMBER:: 90472744
SPECIMEN QUALITY:: ADEQUATE

## 2017-07-17 NOTE — Progress Notes (Deleted)
[name] [address] [place], [place] [address] Phone: [phone] Subjective:    I'm seeing this patient by the request  of:    CC: Back pain follow-up  [id number]  Sarah [name] is a 84 y.o. female coming in with complaint of back pain.  Known sacroiliac arthritis. Parkinson's.  Patient states     Past Medical History:  Diagnosis Date  . Bladder incontinence   . Chest pain    S/P nuclear 07/17/2008  normal; EF 88% with no ischemia  . Chronic venous insufficiency    BLE edema, chronic, neg venous US 2/13  . Constipation    hx of  . Depression   . Diverticulitis   . Esophageal stricture    s/p dilation 04/2012  . GERD (gastroesophageal reflux disease)   . History of chicken pox   . Hyperlipidemia   . Hypertension   . Osteoarthritis   . Osteoporosis, postmenopausal 06/02/2011  . Polyp of colon   . Stroke [facility]) 07/03/2014   Past Surgical History:  Procedure Laterality Date  . APPENDECTOMY     age 20  . CATARACT EXTRACTION, BILATERAL Bilateral 11/28/2015  . COLONOSCOPY    . POLYPECTOMY    . TUBAL LIGATION    . UPPER GASTROINTESTINAL ENDOSCOPY     Social History   Socioeconomic History  . Marital status: Widowed    Spouse name: Not on file  . Number of children: 2  . Years of education: Not on file  . Highest education level: Not on file  Occupational History  . Not on file  Social Needs  . Financial resource strain: Not on file  . Food insecurity:    Worry: Not on file    Inability: Not on file  . Transportation needs:    Medical: Not on file    Non-medical: Not on file  Tobacco Use  . Smoking status: Never Smoker  . Smokeless tobacco: Never Used  Substance and Sexual Activity  . Alcohol use: No    Alcohol/week: 0.0 oz  . Drug use: No  . Sexual activity: Never  Lifestyle  . Physical activity:    Days per week: Not on file    Minutes per session: Not on file  . Stress: Not on file  Relationships  . Social  connections:    Talks on phone: Not on file    Gets together: Not on file    Attends religious service: Not on file    Active member of club or organization: Not on file    Attends meetings of clubs or organizations: Not on file    Relationship status: Not on file  Other Topics Concern  . Not on file  Social History Narrative   She lives alone in [place] home, but stays on the first floor.  Has 2 children.   Allergies  Allergen Reactions  . Lipitor [Atorvastatin Calcium] Other (See Comments)    Leg weakness,memory loss  . Lovastatin Other (See Comments)    Leg weakness  . Celebrex [Celecoxib] Itching and Rash  . Latex Rash  . Macrobid [employer] Macro] Other (See Comments)    Unknown per patient  . Other Other (See Comments)    Always pads   Family History  Problem Relation Age of Onset  . Cancer Son        testicular cancer  . Breast cancer Sister   . Skin cancer Sister   . Colon cancer Neg Hx   .  Esophageal cancer Neg Hx   . Rectal cancer Neg Hx   . Stomach cancer Neg Hx      Past medical history, social, surgical and family history all reviewed in electronic medical record.  No pertanent information unless stated regarding to the chief complaint.   Review of Systems:Review of systems updated and as accurate as of 07/17/17  No headache, visual changes, nausea, vomiting, diarrhea, constipation, dizziness, abdominal pain, skin rash, fevers, chills, night sweats, weight loss, swollen lymph nodes, body aches, joint swelling, muscle aches, chest pain, shortness of breath, mood changes.   Objective  There were no vitals taken for this visit. Systems examined below as of 07/17/17   General: No apparent distress alert and oriented x3 mood and affect normal, dressed appropriately.  HEENT: Pupils equal, extraocular movements intact  Respiratory: Patient's speak in full sentences and does not appear short of breath  Cardiovascular: No lower extremity edema, non  tender, no erythema  Skin: Warm dry intact with no signs of infection or rash on extremities or on axial skeleton.  Abdomen: Soft nontender  Neuro: Cranial nerves II through XII are intact, neurovascularly intact in all extremities with 2+ DTRs and 2+ pulses.  Lymph: No lymphadenopathy of posterior or anterior cervical chain or axillae bilaterally.  Gait normal with good balance and coordination.  MSK:  Non tender with full range of motion and good stability and symmetric strength and tone of shoulders, elbows, wrist, hip, knee and ankles bilaterally.     Impression and Recommendations:     This case required medical decision making of moderate complexity.      Note: This dictation was prepared with Dragon dictation along with smaller phrase technology. Any transcriptional errors that result from this process are unintentional.

## 2017-07-18 ENCOUNTER — Ambulatory Visit: Payer: Medicare Other | Admitting: Family Medicine

## 2017-07-19 ENCOUNTER — Telehealth: Payer: Self-pay | Admitting: Internal Medicine

## 2017-07-19 DIAGNOSIS — M1990 Unspecified osteoarthritis, unspecified site: Secondary | ICD-10-CM | POA: Diagnosis not present

## 2017-07-19 DIAGNOSIS — F339 Major depressive disorder, recurrent, unspecified: Secondary | ICD-10-CM | POA: Diagnosis not present

## 2017-07-19 DIAGNOSIS — E785 Hyperlipidemia, unspecified: Secondary | ICD-10-CM | POA: Diagnosis not present

## 2017-07-19 DIAGNOSIS — I872 Venous insufficiency (chronic) (peripheral): Secondary | ICD-10-CM | POA: Diagnosis not present

## 2017-07-19 DIAGNOSIS — I63412 Cerebral infarction due to embolism of left middle cerebral artery: Secondary | ICD-10-CM | POA: Diagnosis not present

## 2017-07-19 DIAGNOSIS — M810 Age-related osteoporosis without current pathological fracture: Secondary | ICD-10-CM | POA: Diagnosis not present

## 2017-07-19 DIAGNOSIS — K222 Esophageal obstruction: Secondary | ICD-10-CM | POA: Diagnosis not present

## 2017-07-19 DIAGNOSIS — I10 Essential (primary) hypertension: Secondary | ICD-10-CM | POA: Diagnosis not present

## 2017-07-19 DIAGNOSIS — K219 Gastro-esophageal reflux disease without esophagitis: Secondary | ICD-10-CM | POA: Diagnosis not present

## 2017-07-19 DIAGNOSIS — G20 Parkinson's disease: Secondary | ICD-10-CM | POA: Diagnosis not present

## 2017-07-19 DIAGNOSIS — K5700 Diverticulitis of small intestine with perforation and abscess without bleeding: Secondary | ICD-10-CM | POA: Diagnosis not present

## 2017-07-19 NOTE — Telephone Encounter (Signed)
Copied from [place] [id number]. Topic: Quick Communication - See Telephone Encounter >> Jul 19, 2017  5:28 PM Rutherford Nail, NT wrote: CRM for notification. See Telephone encounter for: 07/19/17. Lu-Anne, RN Case manager from [facility] community and home health, calling to inform Dr. Sharlet Salina that the patient was admitted into their services today (07/19/17). Please advise. CB#: [phone]

## 2017-07-20 DIAGNOSIS — K5700 Diverticulitis of small intestine with perforation and abscess without bleeding: Secondary | ICD-10-CM | POA: Diagnosis not present

## 2017-07-20 DIAGNOSIS — I872 Venous insufficiency (chronic) (peripheral): Secondary | ICD-10-CM | POA: Diagnosis not present

## 2017-07-20 DIAGNOSIS — K219 Gastro-esophageal reflux disease without esophagitis: Secondary | ICD-10-CM | POA: Diagnosis not present

## 2017-07-20 DIAGNOSIS — G20 Parkinson's disease: Secondary | ICD-10-CM | POA: Diagnosis not present

## 2017-07-20 DIAGNOSIS — F339 Major depressive disorder, recurrent, unspecified: Secondary | ICD-10-CM | POA: Diagnosis not present

## 2017-07-20 DIAGNOSIS — I63412 Cerebral infarction due to embolism of left middle cerebral artery: Secondary | ICD-10-CM | POA: Diagnosis not present

## 2017-07-20 NOTE — Telephone Encounter (Signed)
Advise on what? 

## 2017-07-20 NOTE — Telephone Encounter (Signed)
Just an FYI not sure why it said please advise

## 2017-07-22 DIAGNOSIS — I63412 Cerebral infarction due to embolism of left middle cerebral artery: Secondary | ICD-10-CM | POA: Diagnosis not present

## 2017-07-22 DIAGNOSIS — K5700 Diverticulitis of small intestine with perforation and abscess without bleeding: Secondary | ICD-10-CM | POA: Diagnosis not present

## 2017-07-22 DIAGNOSIS — I872 Venous insufficiency (chronic) (peripheral): Secondary | ICD-10-CM | POA: Diagnosis not present

## 2017-07-22 DIAGNOSIS — K219 Gastro-esophageal reflux disease without esophagitis: Secondary | ICD-10-CM | POA: Diagnosis not present

## 2017-07-22 DIAGNOSIS — G20 Parkinson's disease: Secondary | ICD-10-CM | POA: Diagnosis not present

## 2017-07-22 DIAGNOSIS — F339 Major depressive disorder, recurrent, unspecified: Secondary | ICD-10-CM | POA: Diagnosis not present

## 2017-07-26 DIAGNOSIS — I872 Venous insufficiency (chronic) (peripheral): Secondary | ICD-10-CM | POA: Diagnosis not present

## 2017-07-26 DIAGNOSIS — K219 Gastro-esophageal reflux disease without esophagitis: Secondary | ICD-10-CM | POA: Diagnosis not present

## 2017-07-26 DIAGNOSIS — K5700 Diverticulitis of small intestine with perforation and abscess without bleeding: Secondary | ICD-10-CM | POA: Diagnosis not present

## 2017-07-26 DIAGNOSIS — I63412 Cerebral infarction due to embolism of left middle cerebral artery: Secondary | ICD-10-CM | POA: Diagnosis not present

## 2017-07-26 DIAGNOSIS — G20 Parkinson's disease: Secondary | ICD-10-CM | POA: Diagnosis not present

## 2017-07-26 DIAGNOSIS — F339 Major depressive disorder, recurrent, unspecified: Secondary | ICD-10-CM | POA: Diagnosis not present

## 2017-07-27 DIAGNOSIS — K5700 Diverticulitis of small intestine with perforation and abscess without bleeding: Secondary | ICD-10-CM | POA: Diagnosis not present

## 2017-07-27 DIAGNOSIS — I872 Venous insufficiency (chronic) (peripheral): Secondary | ICD-10-CM | POA: Diagnosis not present

## 2017-07-27 DIAGNOSIS — M810 Age-related osteoporosis without current pathological fracture: Secondary | ICD-10-CM | POA: Diagnosis not present

## 2017-07-27 DIAGNOSIS — F339 Major depressive disorder, recurrent, unspecified: Secondary | ICD-10-CM | POA: Diagnosis not present

## 2017-07-27 DIAGNOSIS — G20 Parkinson's disease: Secondary | ICD-10-CM | POA: Diagnosis not present

## 2017-07-27 DIAGNOSIS — M1990 Unspecified osteoarthritis, unspecified site: Secondary | ICD-10-CM | POA: Diagnosis not present

## 2017-07-27 DIAGNOSIS — I10 Essential (primary) hypertension: Secondary | ICD-10-CM | POA: Diagnosis not present

## 2017-07-27 DIAGNOSIS — K222 Esophageal obstruction: Secondary | ICD-10-CM | POA: Diagnosis not present

## 2017-07-27 DIAGNOSIS — I63412 Cerebral infarction due to embolism of left middle cerebral artery: Secondary | ICD-10-CM | POA: Diagnosis not present

## 2017-07-27 DIAGNOSIS — K219 Gastro-esophageal reflux disease without esophagitis: Secondary | ICD-10-CM | POA: Diagnosis not present

## 2017-07-27 DIAGNOSIS — E785 Hyperlipidemia, unspecified: Secondary | ICD-10-CM | POA: Diagnosis not present

## 2017-07-29 DIAGNOSIS — I63412 Cerebral infarction due to embolism of left middle cerebral artery: Secondary | ICD-10-CM | POA: Diagnosis not present

## 2017-07-29 DIAGNOSIS — K5700 Diverticulitis of small intestine with perforation and abscess without bleeding: Secondary | ICD-10-CM | POA: Diagnosis not present

## 2017-07-29 DIAGNOSIS — I872 Venous insufficiency (chronic) (peripheral): Secondary | ICD-10-CM | POA: Diagnosis not present

## 2017-07-29 DIAGNOSIS — K219 Gastro-esophageal reflux disease without esophagitis: Secondary | ICD-10-CM | POA: Diagnosis not present

## 2017-07-29 DIAGNOSIS — G20 Parkinson's disease: Secondary | ICD-10-CM | POA: Diagnosis not present

## 2017-07-29 DIAGNOSIS — F339 Major depressive disorder, recurrent, unspecified: Secondary | ICD-10-CM | POA: Diagnosis not present

## 2017-08-04 DIAGNOSIS — K5700 Diverticulitis of small intestine with perforation and abscess without bleeding: Secondary | ICD-10-CM | POA: Diagnosis not present

## 2017-08-04 DIAGNOSIS — I872 Venous insufficiency (chronic) (peripheral): Secondary | ICD-10-CM | POA: Diagnosis not present

## 2017-08-04 DIAGNOSIS — F339 Major depressive disorder, recurrent, unspecified: Secondary | ICD-10-CM | POA: Diagnosis not present

## 2017-08-04 DIAGNOSIS — K219 Gastro-esophageal reflux disease without esophagitis: Secondary | ICD-10-CM | POA: Diagnosis not present

## 2017-08-04 DIAGNOSIS — I63412 Cerebral infarction due to embolism of left middle cerebral artery: Secondary | ICD-10-CM | POA: Diagnosis not present

## 2017-08-04 DIAGNOSIS — G20 Parkinson's disease: Secondary | ICD-10-CM | POA: Diagnosis not present

## 2017-08-05 ENCOUNTER — Encounter

## 2017-08-08 DIAGNOSIS — F339 Major depressive disorder, recurrent, unspecified: Secondary | ICD-10-CM | POA: Diagnosis not present

## 2017-08-08 DIAGNOSIS — K219 Gastro-esophageal reflux disease without esophagitis: Secondary | ICD-10-CM | POA: Diagnosis not present

## 2017-08-08 DIAGNOSIS — I63412 Cerebral infarction due to embolism of left middle cerebral artery: Secondary | ICD-10-CM | POA: Diagnosis not present

## 2017-08-08 DIAGNOSIS — I872 Venous insufficiency (chronic) (peripheral): Secondary | ICD-10-CM | POA: Diagnosis not present

## 2017-08-08 DIAGNOSIS — G20 Parkinson's disease: Secondary | ICD-10-CM | POA: Diagnosis not present

## 2017-08-08 DIAGNOSIS — K5700 Diverticulitis of small intestine with perforation and abscess without bleeding: Secondary | ICD-10-CM | POA: Diagnosis not present

## 2017-08-10 DIAGNOSIS — I872 Venous insufficiency (chronic) (peripheral): Secondary | ICD-10-CM | POA: Diagnosis not present

## 2017-08-10 DIAGNOSIS — I63412 Cerebral infarction due to embolism of left middle cerebral artery: Secondary | ICD-10-CM | POA: Diagnosis not present

## 2017-08-10 DIAGNOSIS — K5700 Diverticulitis of small intestine with perforation and abscess without bleeding: Secondary | ICD-10-CM | POA: Diagnosis not present

## 2017-08-10 DIAGNOSIS — G20 Parkinson's disease: Secondary | ICD-10-CM | POA: Diagnosis not present

## 2017-08-10 DIAGNOSIS — F339 Major depressive disorder, recurrent, unspecified: Secondary | ICD-10-CM | POA: Diagnosis not present

## 2017-08-10 DIAGNOSIS — K219 Gastro-esophageal reflux disease without esophagitis: Secondary | ICD-10-CM | POA: Diagnosis not present

## 2017-08-12 DIAGNOSIS — I63412 Cerebral infarction due to embolism of left middle cerebral artery: Secondary | ICD-10-CM | POA: Diagnosis not present

## 2017-08-12 DIAGNOSIS — K5700 Diverticulitis of small intestine with perforation and abscess without bleeding: Secondary | ICD-10-CM | POA: Diagnosis not present

## 2017-08-12 DIAGNOSIS — F339 Major depressive disorder, recurrent, unspecified: Secondary | ICD-10-CM | POA: Diagnosis not present

## 2017-08-12 DIAGNOSIS — K219 Gastro-esophageal reflux disease without esophagitis: Secondary | ICD-10-CM | POA: Diagnosis not present

## 2017-08-12 DIAGNOSIS — I872 Venous insufficiency (chronic) (peripheral): Secondary | ICD-10-CM | POA: Diagnosis not present

## 2017-08-12 DIAGNOSIS — G20 Parkinson's disease: Secondary | ICD-10-CM | POA: Diagnosis not present

## 2017-08-14 DIAGNOSIS — K219 Gastro-esophageal reflux disease without esophagitis: Secondary | ICD-10-CM | POA: Diagnosis not present

## 2017-08-14 DIAGNOSIS — F339 Major depressive disorder, recurrent, unspecified: Secondary | ICD-10-CM | POA: Diagnosis not present

## 2017-08-14 DIAGNOSIS — I63412 Cerebral infarction due to embolism of left middle cerebral artery: Secondary | ICD-10-CM | POA: Diagnosis not present

## 2017-08-14 DIAGNOSIS — I872 Venous insufficiency (chronic) (peripheral): Secondary | ICD-10-CM | POA: Diagnosis not present

## 2017-08-14 DIAGNOSIS — K5700 Diverticulitis of small intestine with perforation and abscess without bleeding: Secondary | ICD-10-CM | POA: Diagnosis not present

## 2017-08-14 DIAGNOSIS — G20 Parkinson's disease: Secondary | ICD-10-CM | POA: Diagnosis not present

## 2017-08-15 DIAGNOSIS — I872 Venous insufficiency (chronic) (peripheral): Secondary | ICD-10-CM | POA: Diagnosis not present

## 2017-08-15 DIAGNOSIS — F339 Major depressive disorder, recurrent, unspecified: Secondary | ICD-10-CM | POA: Diagnosis not present

## 2017-08-15 DIAGNOSIS — K219 Gastro-esophageal reflux disease without esophagitis: Secondary | ICD-10-CM | POA: Diagnosis not present

## 2017-08-15 DIAGNOSIS — K5700 Diverticulitis of small intestine with perforation and abscess without bleeding: Secondary | ICD-10-CM | POA: Diagnosis not present

## 2017-08-15 DIAGNOSIS — I63412 Cerebral infarction due to embolism of left middle cerebral artery: Secondary | ICD-10-CM | POA: Diagnosis not present

## 2017-08-15 DIAGNOSIS — G20 Parkinson's disease: Secondary | ICD-10-CM | POA: Diagnosis not present

## 2017-08-27 DEATH — deceased

## 2019-04-05 IMAGING — CR DG SHOULDER 2+V*R*
2 series · 2 of 2 positions shown · non-contrast
Comparison: None.

CLINICAL DATA: Fell out of bit head this morning with shoulder
pain.

EXAM:
RIGHT SHOULDER - 2+ VIEW

[shoulder grashey]
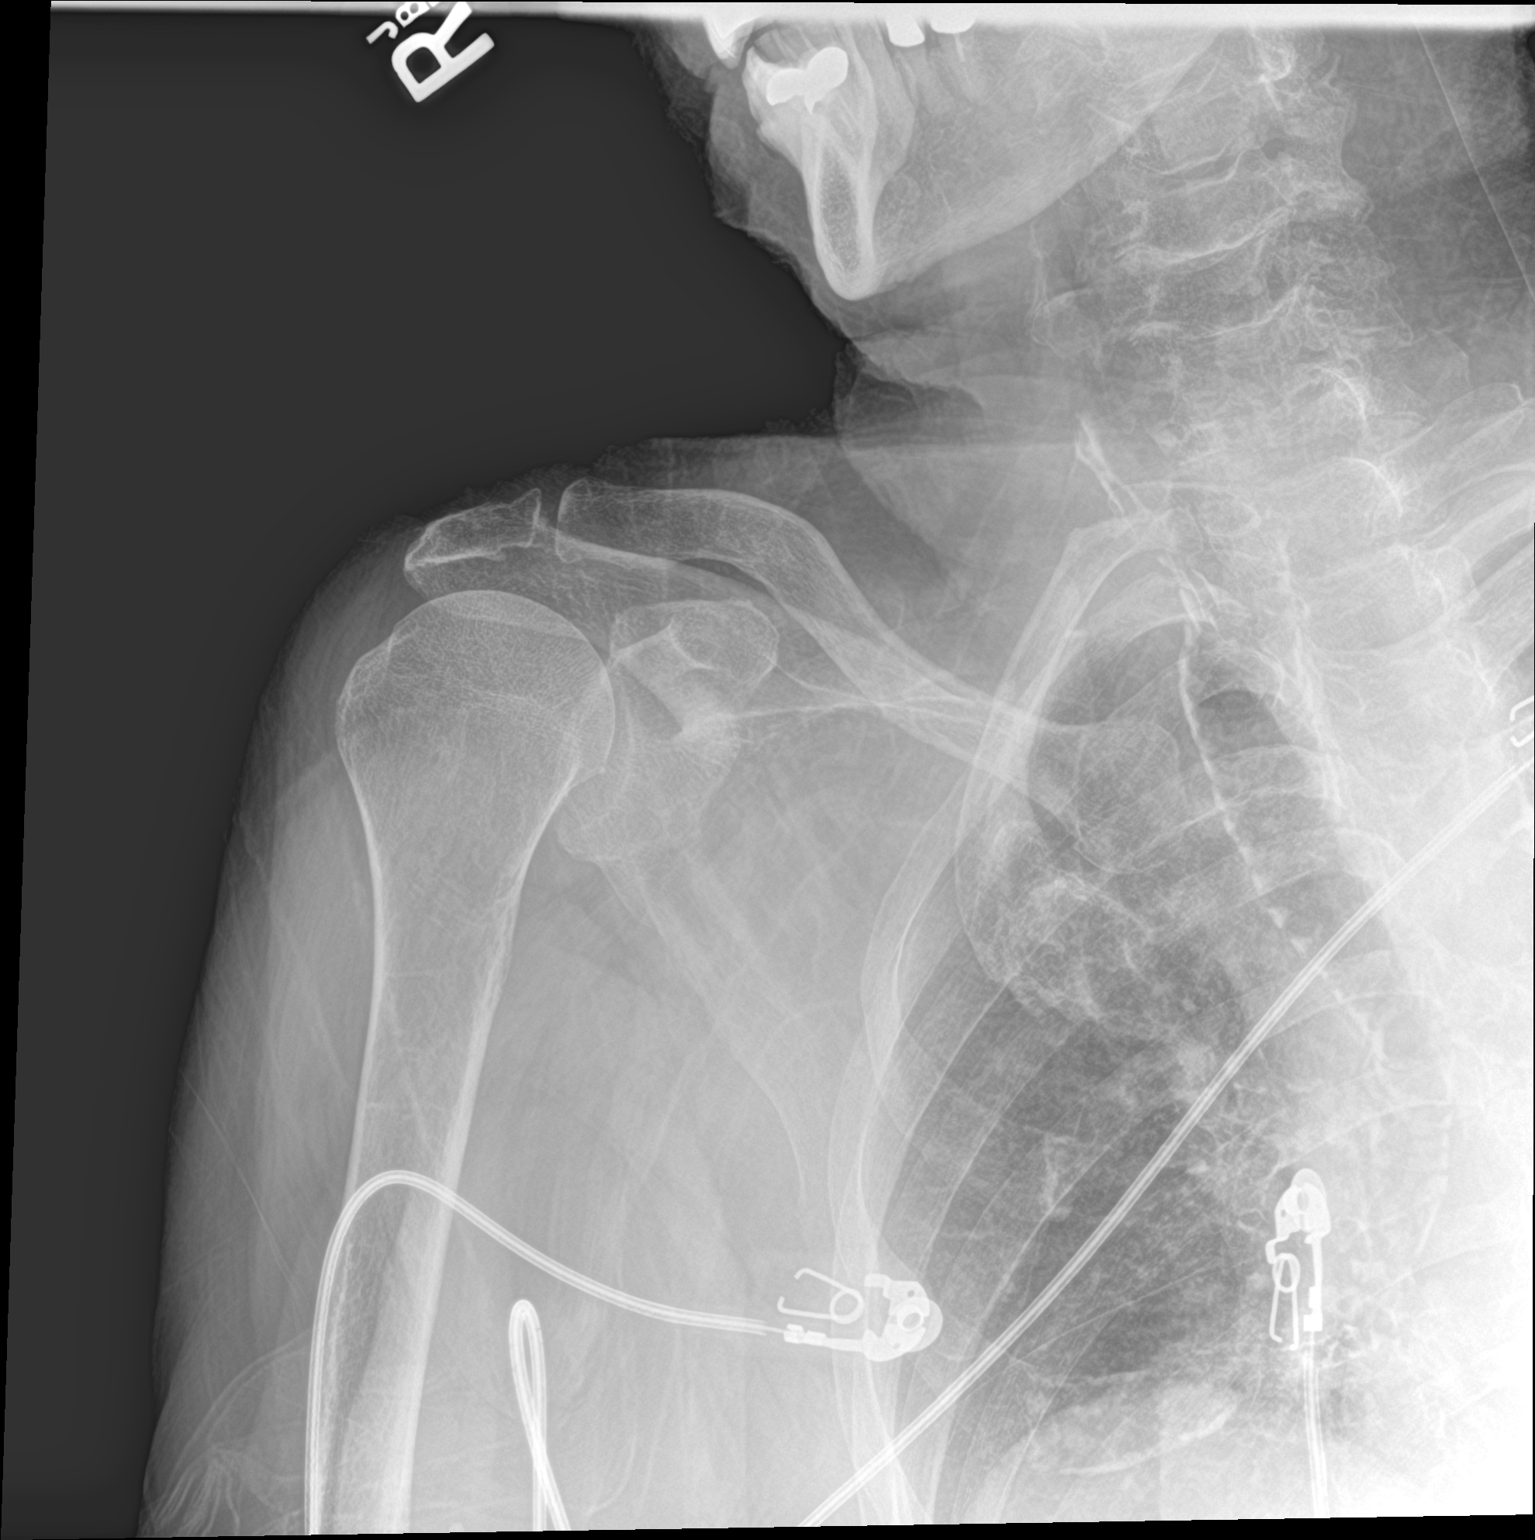

[shoulder y view]
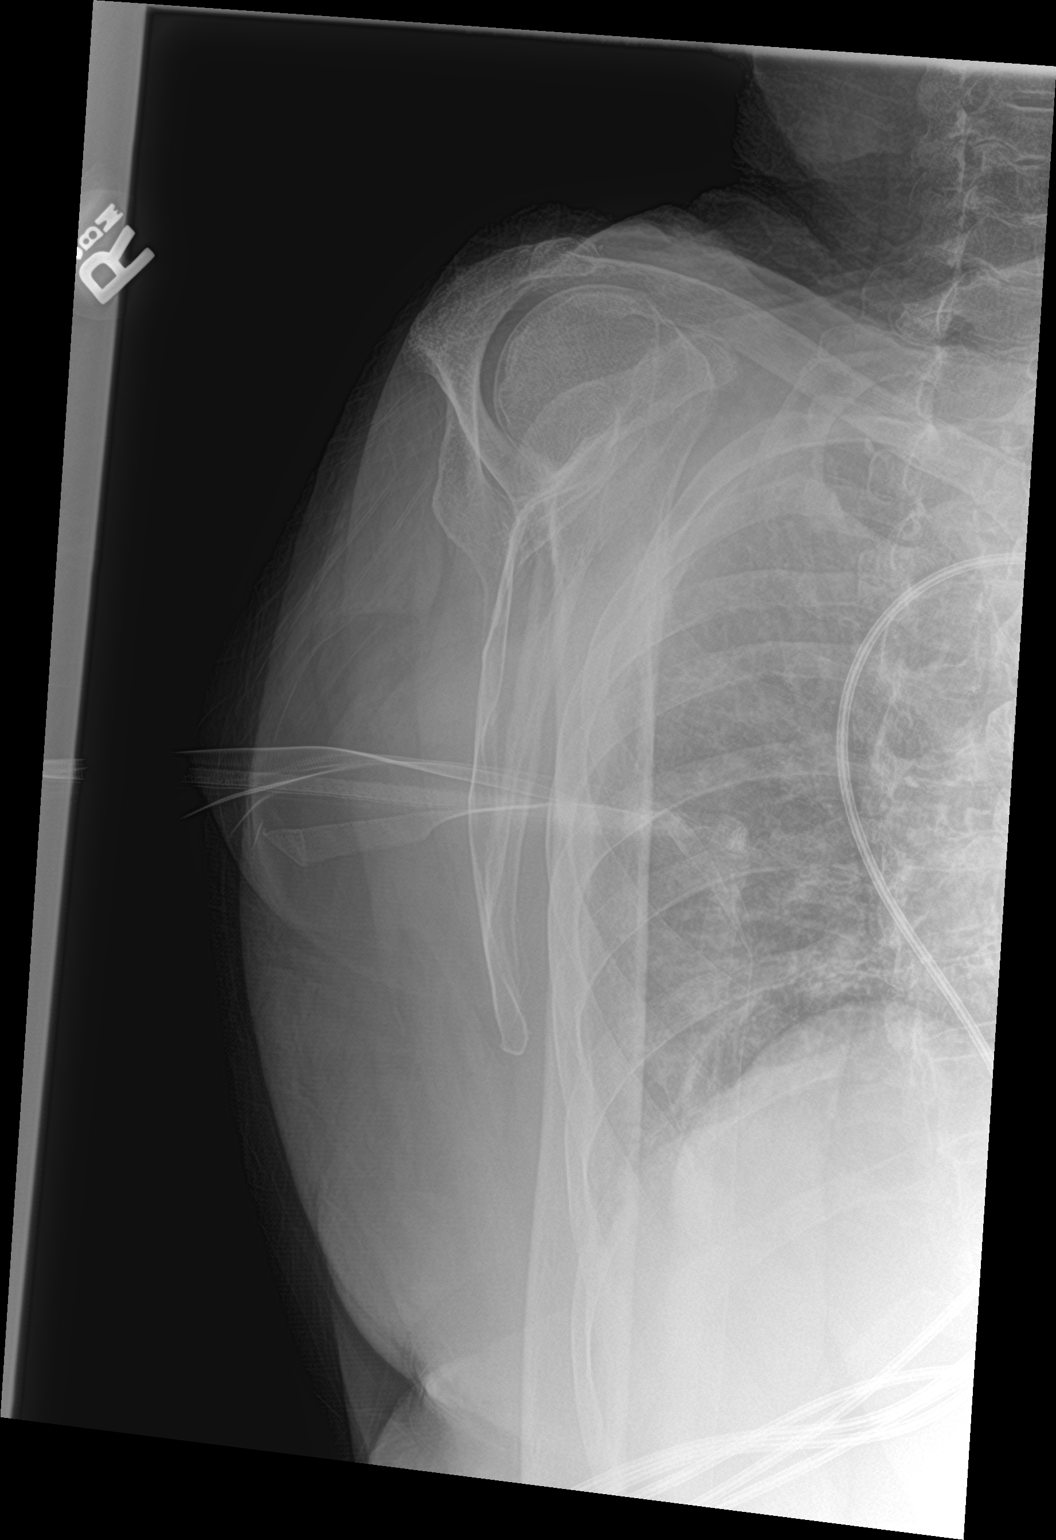

[2 of 2 positions shown; findings below may reference images not displayed]

FINDINGS: There is no evidence of fracture or dislocation. There is no
evidence of arthropathy or other focal bone abnormality. Soft
tissues are unremarkable.
IMPRESSION: Negative.

## 2019-04-05 IMAGING — CT CT CERVICAL SPINE W/O CM
3 of 4 series · 12 of 33 positions shown, 14 images · non-contrast
Comparison: Brain MRI 04/27/2016.  Head CT 04/27/2016 and earlier.

CLINICAL DATA: 89 y/o female status post fall. Fell this morning
landing on right side. Head and neck pain. On blood thinners.

EXAM:
CT HEAD WITHOUT CONTRAST
CT CERVICAL SPINE WITHOUT CONTRAST
TECHNIQUE: Multidetector CT imaging of the head and cervical spine was
performed following the standard protocol without intravenous
contrast. Multiplanar CT image reconstructions of the cervical spine
were also generated.

[Series 5: c_spine 2.0 st · axial · 0.27mm/px · z∈[-235,-117]mm · 4 of 89 slices shown, 5 images]
[im 15/89  soft-tissue]
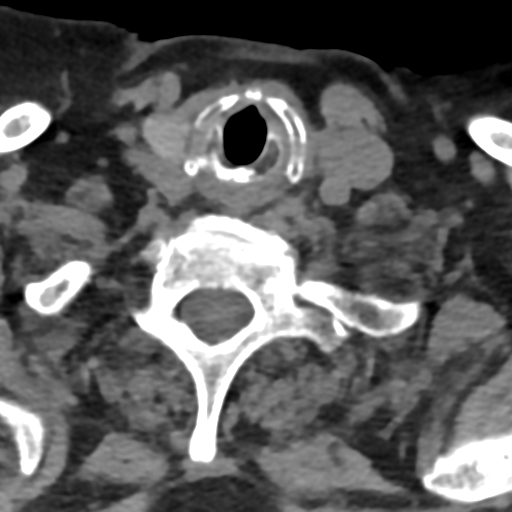
[im 15/89  bone]
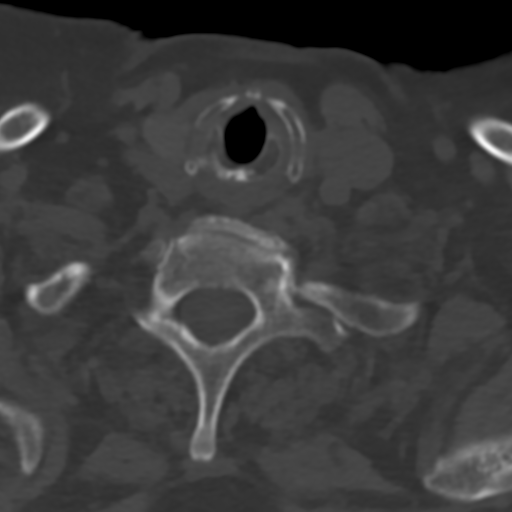
[im 30/89  bone]
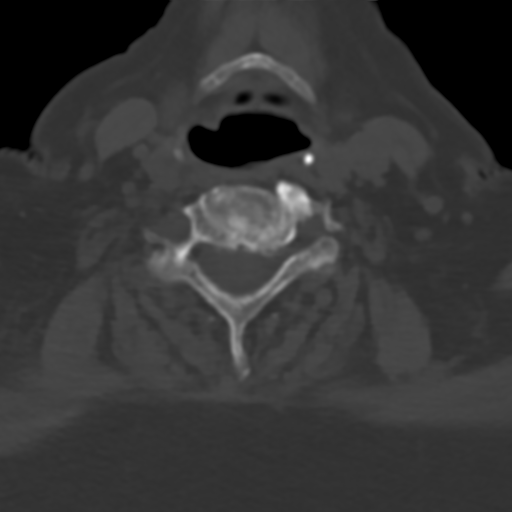
[im 59/89  bone]
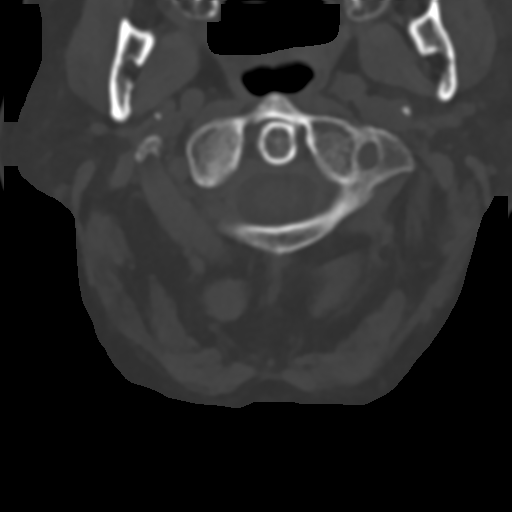
[im 74/89  bone]
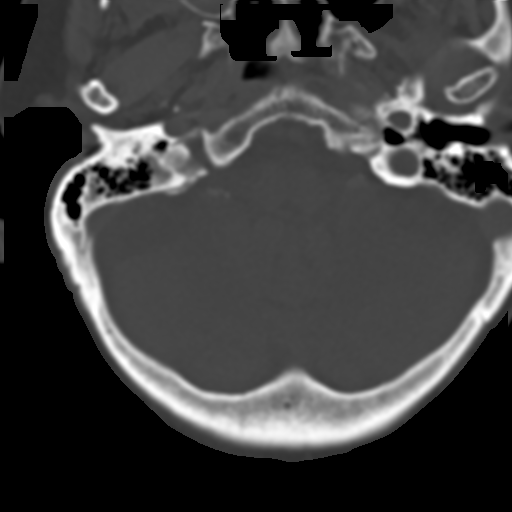

[Series 6: coronal bone · coronal · 0.26mm/px · 3 of 61 slices shown]
[im 13/61  bone]
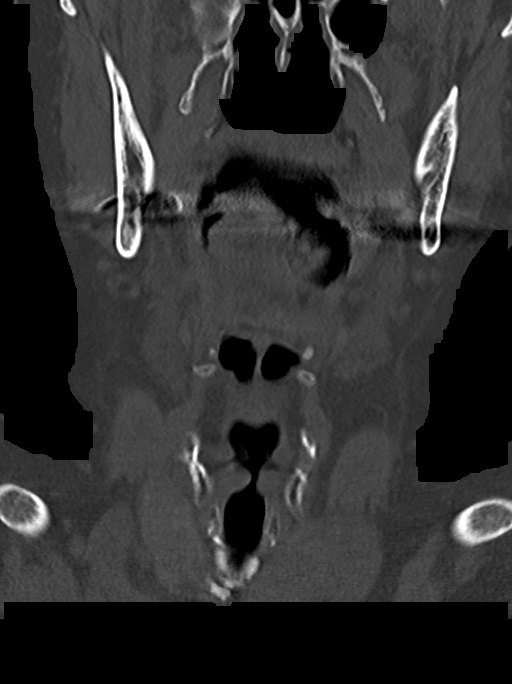
[im 25/61  bone]
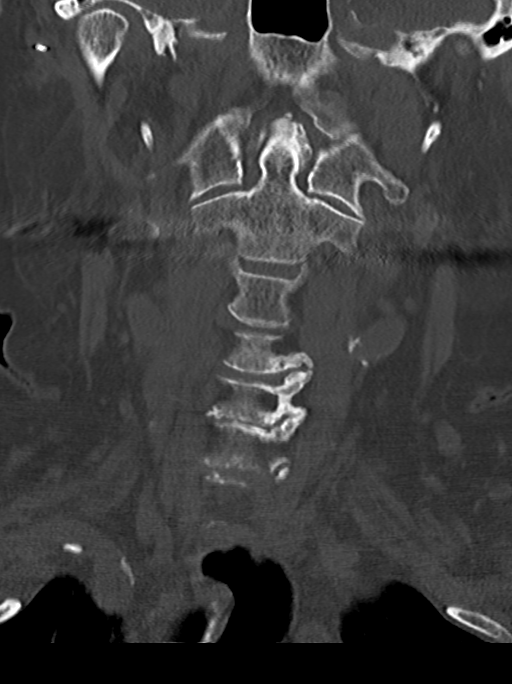
[im 37/61  bone]
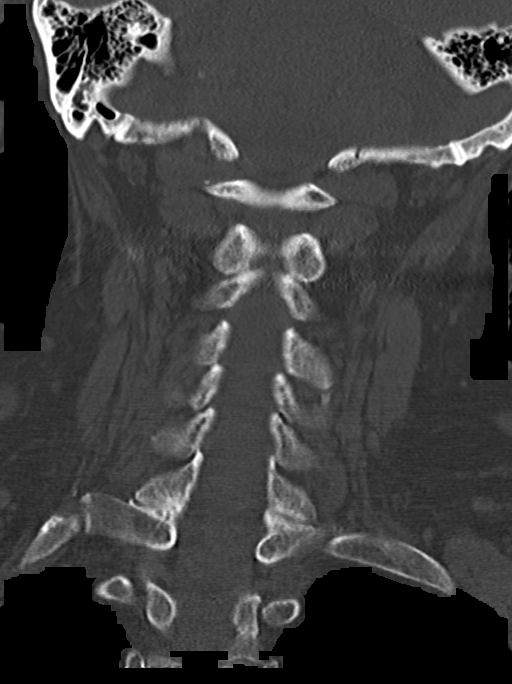

[Series 8: sagittal bone · sagittal · 0.26mm/px · 5 of 61 slices shown, 6 images]
[im 21/61  bone]
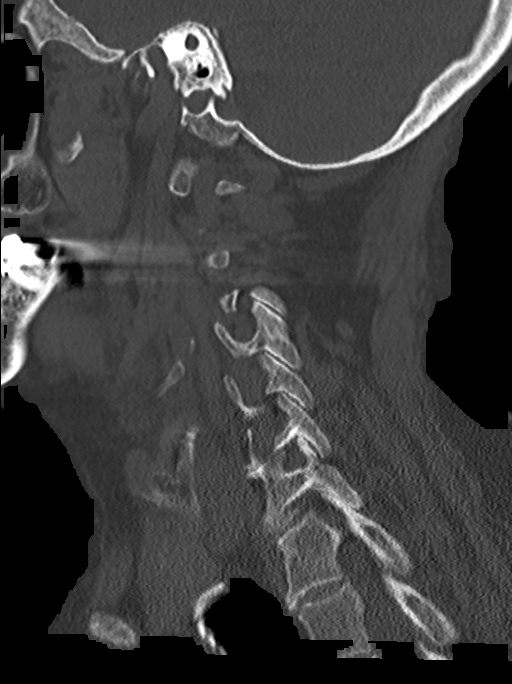
[im 26/61  bone]
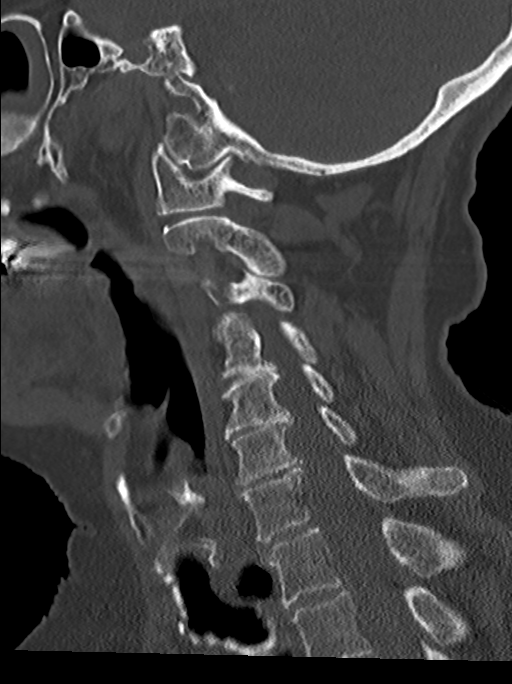
[im 31/61  soft-tissue]
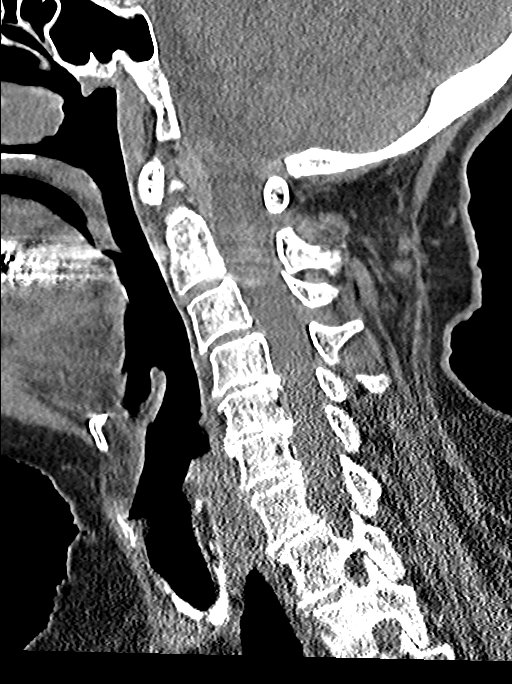
[im 31/61  bone]
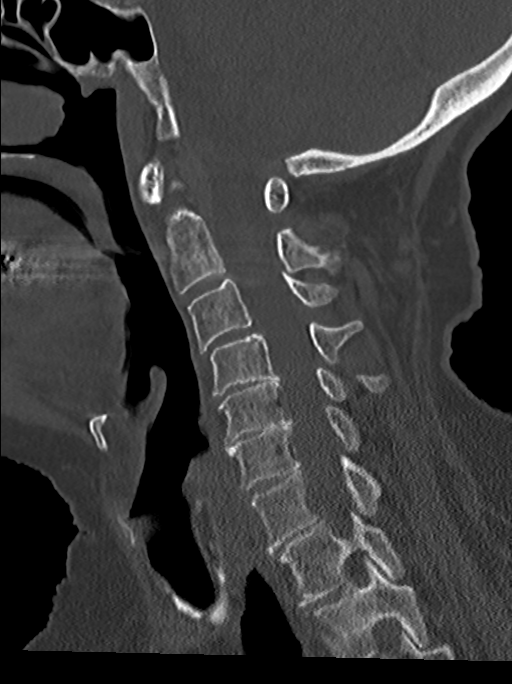
[im 36/61  bone]
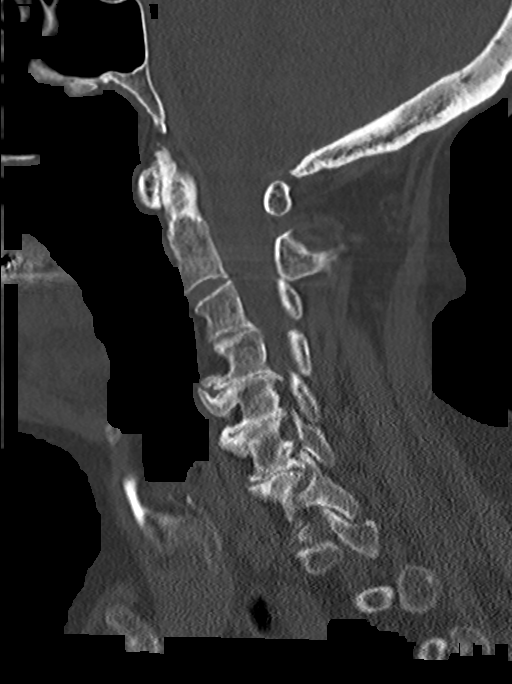
[im 41/61  bone]
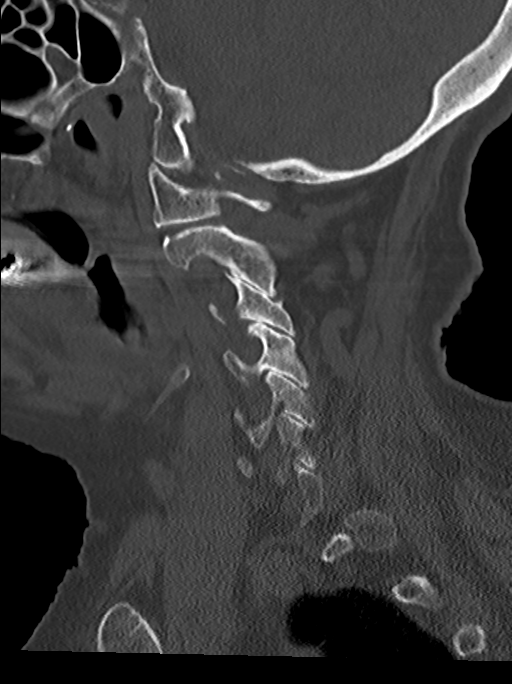

[12 of 33 positions shown; findings below may reference images not displayed]

FINDINGS: CT HEAD FINDINGS

Brain: Patchy and confluent bilateral cerebral white matter
hypodensity extending into the deep white matter capsules. Focal
chronic lacune of the left corona radiata.

No midline shift, ventriculomegaly, intracranial hemorrhage or
evidence of cortically based acute infarction. No cortical
encephalomalacia identified.

Coarsely calcified extra-axial mass arising from the right anterior
clinoid process measures 15 mm diameter (series 5, image 27) and is
chronic compatible with meningioma. This has not significantly
changed since 3142. No other intracranial mass lesion or mass
effect.

Vascular: Calcified atherosclerosis at the skull base.

Skull: Stable and intact.

Sinuses/Orbits: Mild mucosal thickening now on the right maxillary
sinus. Other visible paranasal sinuses and mastoids are stable and
well pneumatized.

Other: No scalp hematoma.  Stable orbits soft tissues.

CT CERVICAL SPINE FINDINGS

Alignment: Straightening of cervical lordosis. Mild degenerative
appearing anterolisthesis at C3-C4 and C7-T1. Bilateral posterior
element alignment is within normal limits.

Skull base and vertebrae: Visualized skull base is intact. No
atlanto-occipital dissociation. No acute cervical spine fracture.

Soft tissues and spinal canal: Negative for age noncontrast neck
soft tissues.

Disc levels: Advanced chronic disc and endplate degeneration C4-C5
through C7-T1. Associated left greater than right neural foraminal
stenosis at those levels.

Upper chest: Visible upper thoracic levels appear intact. Negative
lung apices.
IMPRESSION: 1. No acute intracranial abnormality. No acute fracture in the
cervical spine. No acute traumatic injury identified.
2. Advanced chronic small vessel disease.
3. Chronic benign 15 mm meningioma suspected at the right anterior
clinoid process.
